# Patient Record
Sex: Female | Born: 1935 | Race: Black or African American | Hispanic: No | Smoking: Never smoker
Health system: Southern US, Community
[De-identification: ages and names within clinical notes are randomized; demographics above are authoritative.]

## PROBLEM LIST (undated history)

## (undated) DIAGNOSIS — I519 Heart disease, unspecified: Secondary | ICD-10-CM

## (undated) DIAGNOSIS — E7800 Pure hypercholesterolemia, unspecified: Secondary | ICD-10-CM

## (undated) DIAGNOSIS — M1990 Unspecified osteoarthritis, unspecified site: Secondary | ICD-10-CM

## (undated) DIAGNOSIS — I10 Essential (primary) hypertension: Secondary | ICD-10-CM

## (undated) DIAGNOSIS — K631 Perforation of intestine (nontraumatic): Secondary | ICD-10-CM

## (undated) DIAGNOSIS — E119 Type 2 diabetes mellitus without complications: Secondary | ICD-10-CM

## (undated) DIAGNOSIS — F0390 Unspecified dementia without behavioral disturbance: Secondary | ICD-10-CM

## (undated) DIAGNOSIS — Z8719 Personal history of other diseases of the digestive system: Secondary | ICD-10-CM

## (undated) DIAGNOSIS — K219 Gastro-esophageal reflux disease without esophagitis: Secondary | ICD-10-CM

## (undated) DIAGNOSIS — E039 Hypothyroidism, unspecified: Secondary | ICD-10-CM

## (undated) HISTORY — DX: Gastro-esophageal reflux disease without esophagitis: K21.9

## (undated) HISTORY — PX: BLADDER SUSPENSION: SHX72

---

## 1998-03-07 ENCOUNTER — Ambulatory Visit (HOSPITAL_COMMUNITY): Admission: RE | Admit: 1998-03-07 | Discharge: 1998-03-07 | Payer: Self-pay | Admitting: *Deleted

## 1998-03-07 ENCOUNTER — Other Ambulatory Visit: Admission: RE | Admit: 1998-03-07 | Discharge: 1998-03-07 | Payer: Self-pay | Admitting: *Deleted

## 1998-03-12 ENCOUNTER — Ambulatory Visit (HOSPITAL_COMMUNITY): Admission: RE | Admit: 1998-03-12 | Discharge: 1998-03-12 | Payer: Self-pay | Admitting: *Deleted

## 1998-04-10 ENCOUNTER — Other Ambulatory Visit: Admission: RE | Admit: 1998-04-10 | Discharge: 1998-04-10 | Payer: Self-pay | Admitting: *Deleted

## 1998-05-14 ENCOUNTER — Emergency Department (HOSPITAL_COMMUNITY): Admission: EM | Admit: 1998-05-14 | Discharge: 1998-05-14 | Payer: Self-pay | Admitting: Emergency Medicine

## 1998-05-16 ENCOUNTER — Other Ambulatory Visit: Admission: RE | Admit: 1998-05-16 | Discharge: 1998-05-16 | Payer: Self-pay | Admitting: *Deleted

## 1998-11-28 ENCOUNTER — Other Ambulatory Visit: Admission: RE | Admit: 1998-11-28 | Discharge: 1998-11-28 | Payer: Self-pay | Admitting: Obstetrics and Gynecology

## 1999-02-16 ENCOUNTER — Emergency Department (HOSPITAL_COMMUNITY): Admission: EM | Admit: 1999-02-16 | Discharge: 1999-02-16 | Payer: Self-pay | Admitting: Emergency Medicine

## 1999-03-27 ENCOUNTER — Other Ambulatory Visit: Admission: RE | Admit: 1999-03-27 | Discharge: 1999-03-27 | Payer: Self-pay | Admitting: *Deleted

## 1999-04-16 ENCOUNTER — Ambulatory Visit (HOSPITAL_COMMUNITY): Admission: RE | Admit: 1999-04-16 | Discharge: 1999-04-16 | Payer: Self-pay | Admitting: *Deleted

## 1999-10-29 ENCOUNTER — Emergency Department (HOSPITAL_COMMUNITY): Admission: EM | Admit: 1999-10-29 | Discharge: 1999-10-29 | Payer: Self-pay | Admitting: Emergency Medicine

## 1999-10-29 ENCOUNTER — Encounter: Payer: Self-pay | Admitting: Emergency Medicine

## 1999-11-27 ENCOUNTER — Other Ambulatory Visit: Admission: RE | Admit: 1999-11-27 | Discharge: 1999-11-27 | Payer: Self-pay | Admitting: Obstetrics and Gynecology

## 2000-02-11 ENCOUNTER — Inpatient Hospital Stay (HOSPITAL_COMMUNITY): Admission: RE | Admit: 2000-02-11 | Discharge: 2000-02-14 | Payer: Self-pay | Admitting: Obstetrics and Gynecology

## 2000-03-16 ENCOUNTER — Emergency Department (HOSPITAL_COMMUNITY): Admission: EM | Admit: 2000-03-16 | Discharge: 2000-03-16 | Payer: Self-pay | Admitting: Emergency Medicine

## 2000-04-07 ENCOUNTER — Other Ambulatory Visit: Admission: RE | Admit: 2000-04-07 | Discharge: 2000-04-07 | Payer: Self-pay | Admitting: Urology

## 2000-06-29 ENCOUNTER — Ambulatory Visit (HOSPITAL_COMMUNITY): Admission: RE | Admit: 2000-06-29 | Discharge: 2000-06-29 | Payer: Self-pay | Admitting: *Deleted

## 2001-07-01 ENCOUNTER — Encounter: Payer: Self-pay | Admitting: Internal Medicine

## 2001-07-01 ENCOUNTER — Ambulatory Visit (HOSPITAL_COMMUNITY): Admission: RE | Admit: 2001-07-01 | Discharge: 2001-07-01 | Payer: Self-pay | Admitting: *Deleted

## 2002-04-14 ENCOUNTER — Encounter: Payer: Self-pay | Admitting: Internal Medicine

## 2002-04-14 ENCOUNTER — Ambulatory Visit (HOSPITAL_COMMUNITY): Admission: RE | Admit: 2002-04-14 | Discharge: 2002-04-14 | Payer: Self-pay | Admitting: Internal Medicine

## 2002-07-12 ENCOUNTER — Other Ambulatory Visit: Admission: RE | Admit: 2002-07-12 | Discharge: 2002-07-12 | Payer: Self-pay | Admitting: Internal Medicine

## 2002-07-19 ENCOUNTER — Ambulatory Visit (HOSPITAL_COMMUNITY): Admission: RE | Admit: 2002-07-19 | Discharge: 2002-07-19 | Payer: Self-pay | Admitting: Obstetrics and Gynecology

## 2002-07-19 ENCOUNTER — Encounter: Payer: Self-pay | Admitting: Internal Medicine

## 2002-08-08 ENCOUNTER — Ambulatory Visit (HOSPITAL_COMMUNITY): Admission: RE | Admit: 2002-08-08 | Discharge: 2002-08-08 | Payer: Self-pay | Admitting: Internal Medicine

## 2002-08-08 ENCOUNTER — Encounter: Payer: Self-pay | Admitting: Internal Medicine

## 2002-08-18 ENCOUNTER — Ambulatory Visit (HOSPITAL_COMMUNITY): Admission: RE | Admit: 2002-08-18 | Discharge: 2002-08-18 | Payer: Self-pay | Admitting: Internal Medicine

## 2002-08-18 ENCOUNTER — Encounter: Payer: Self-pay | Admitting: Internal Medicine

## 2003-01-07 ENCOUNTER — Encounter: Payer: Self-pay | Admitting: Emergency Medicine

## 2003-01-07 ENCOUNTER — Emergency Department (HOSPITAL_COMMUNITY): Admission: EM | Admit: 2003-01-07 | Discharge: 2003-01-07 | Payer: Self-pay | Admitting: Emergency Medicine

## 2003-01-12 ENCOUNTER — Encounter: Payer: Self-pay | Admitting: *Deleted

## 2003-01-12 ENCOUNTER — Emergency Department (HOSPITAL_COMMUNITY): Admission: EM | Admit: 2003-01-12 | Discharge: 2003-01-12 | Payer: Self-pay | Admitting: Emergency Medicine

## 2003-08-09 ENCOUNTER — Ambulatory Visit (HOSPITAL_COMMUNITY): Admission: RE | Admit: 2003-08-09 | Discharge: 2003-08-09 | Payer: Self-pay | Admitting: Internal Medicine

## 2003-08-09 ENCOUNTER — Encounter: Payer: Self-pay | Admitting: Internal Medicine

## 2003-11-19 ENCOUNTER — Encounter: Admission: RE | Admit: 2003-11-19 | Discharge: 2003-11-19 | Payer: Self-pay | Admitting: Orthopedic Surgery

## 2003-11-22 ENCOUNTER — Emergency Department (HOSPITAL_COMMUNITY): Admission: EM | Admit: 2003-11-22 | Discharge: 2003-11-22 | Payer: Self-pay | Admitting: Emergency Medicine

## 2004-01-21 ENCOUNTER — Encounter: Admission: RE | Admit: 2004-01-21 | Discharge: 2004-01-21 | Payer: Self-pay | Admitting: Orthopedic Surgery

## 2004-09-08 ENCOUNTER — Ambulatory Visit (HOSPITAL_COMMUNITY): Admission: RE | Admit: 2004-09-08 | Discharge: 2004-09-08 | Payer: Self-pay | Admitting: Internal Medicine

## 2005-08-12 ENCOUNTER — Other Ambulatory Visit: Admission: RE | Admit: 2005-08-12 | Discharge: 2005-08-12 | Payer: Self-pay | Admitting: Internal Medicine

## 2005-08-13 ENCOUNTER — Emergency Department (HOSPITAL_COMMUNITY): Admission: EM | Admit: 2005-08-13 | Discharge: 2005-08-13 | Payer: Self-pay | Admitting: Emergency Medicine

## 2006-05-17 ENCOUNTER — Encounter: Admission: RE | Admit: 2006-05-17 | Discharge: 2006-05-17 | Payer: Self-pay | Admitting: Orthopaedic Surgery

## 2006-06-01 ENCOUNTER — Emergency Department (HOSPITAL_COMMUNITY): Admission: EM | Admit: 2006-06-01 | Discharge: 2006-06-01 | Payer: Self-pay | Admitting: Emergency Medicine

## 2006-06-08 ENCOUNTER — Emergency Department (HOSPITAL_COMMUNITY): Admission: EM | Admit: 2006-06-08 | Discharge: 2006-06-08 | Payer: Self-pay | Admitting: Emergency Medicine

## 2006-06-22 ENCOUNTER — Encounter: Admission: RE | Admit: 2006-06-22 | Discharge: 2006-06-22 | Payer: Self-pay | Admitting: Orthopaedic Surgery

## 2006-11-01 ENCOUNTER — Encounter: Admission: RE | Admit: 2006-11-01 | Discharge: 2006-11-01 | Payer: Self-pay | Admitting: *Deleted

## 2006-12-13 ENCOUNTER — Other Ambulatory Visit: Admission: RE | Admit: 2006-12-13 | Discharge: 2006-12-13 | Payer: Self-pay | Admitting: Internal Medicine

## 2006-12-21 ENCOUNTER — Ambulatory Visit (HOSPITAL_COMMUNITY): Admission: RE | Admit: 2006-12-21 | Discharge: 2006-12-21 | Payer: Self-pay | Admitting: Internal Medicine

## 2007-03-23 ENCOUNTER — Encounter: Admission: RE | Admit: 2007-03-23 | Discharge: 2007-03-23 | Payer: Self-pay | Admitting: Orthopedic Surgery

## 2007-04-05 ENCOUNTER — Encounter: Admission: RE | Admit: 2007-04-05 | Discharge: 2007-04-05 | Payer: Self-pay | Admitting: Orthopedic Surgery

## 2007-05-27 ENCOUNTER — Encounter: Admission: RE | Admit: 2007-05-27 | Discharge: 2007-05-27 | Payer: Self-pay | Admitting: Orthopedic Surgery

## 2007-11-01 ENCOUNTER — Emergency Department (HOSPITAL_COMMUNITY): Admission: EM | Admit: 2007-11-01 | Discharge: 2007-11-02 | Payer: Self-pay | Admitting: Emergency Medicine

## 2008-04-24 ENCOUNTER — Emergency Department (HOSPITAL_COMMUNITY): Admission: EM | Admit: 2008-04-24 | Discharge: 2008-04-24 | Payer: Self-pay | Admitting: Emergency Medicine

## 2008-11-08 ENCOUNTER — Encounter: Admission: RE | Admit: 2008-11-08 | Discharge: 2008-11-08 | Payer: Self-pay | Admitting: Chiropractic Medicine

## 2008-12-28 ENCOUNTER — Inpatient Hospital Stay (HOSPITAL_COMMUNITY): Admission: EM | Admit: 2008-12-28 | Discharge: 2008-12-30 | Payer: Self-pay | Admitting: Emergency Medicine

## 2009-04-09 ENCOUNTER — Emergency Department (HOSPITAL_COMMUNITY): Admission: EM | Admit: 2009-04-09 | Discharge: 2009-04-09 | Payer: Self-pay | Admitting: Emergency Medicine

## 2010-01-08 ENCOUNTER — Encounter: Admission: RE | Admit: 2010-01-08 | Discharge: 2010-01-08 | Payer: Self-pay | Admitting: Chiropractic Medicine

## 2010-01-13 ENCOUNTER — Inpatient Hospital Stay (HOSPITAL_COMMUNITY): Admission: EM | Admit: 2010-01-13 | Discharge: 2010-01-16 | Payer: Self-pay | Admitting: Emergency Medicine

## 2010-01-14 ENCOUNTER — Encounter (INDEPENDENT_AMBULATORY_CARE_PROVIDER_SITE_OTHER): Payer: Self-pay | Admitting: Family Medicine

## 2010-01-14 ENCOUNTER — Ambulatory Visit: Payer: Self-pay | Admitting: Surgery

## 2010-11-05 ENCOUNTER — Encounter
Admission: RE | Admit: 2010-11-05 | Discharge: 2010-11-05 | Payer: Self-pay | Source: Home / Self Care | Attending: Cardiology | Admitting: Cardiology

## 2010-11-24 ENCOUNTER — Encounter: Payer: Self-pay | Admitting: Internal Medicine

## 2010-12-04 ENCOUNTER — Ambulatory Visit (HOSPITAL_COMMUNITY): Payer: Medicare Other

## 2010-12-08 ENCOUNTER — Ambulatory Visit (HOSPITAL_COMMUNITY)
Admission: RE | Admit: 2010-12-08 | Discharge: 2010-12-08 | Disposition: A | Discharge status: Home / Self Care | Payer: Medicare Other | Source: Ambulatory Visit | Attending: Cardiology | Admitting: Cardiology

## 2010-12-08 DIAGNOSIS — M79609 Pain in unspecified limb: Secondary | ICD-10-CM | POA: Insufficient documentation

## 2010-12-08 DIAGNOSIS — R609 Edema, unspecified: Secondary | ICD-10-CM

## 2011-01-26 LAB — CARDIAC PANEL(CRET KIN+CKTOT+MB+TROPI)
CK, MB: 6.9 ng/mL (ref 0.3–4.0)
Total CK: 594 U/L — ABNORMAL HIGH (ref 7–177)
Total CK: 596 U/L — ABNORMAL HIGH (ref 7–177)

## 2011-01-26 LAB — GLUCOSE, CAPILLARY
Glucose-Capillary: 100 mg/dL — ABNORMAL HIGH (ref 70–99)
Glucose-Capillary: 100 mg/dL — ABNORMAL HIGH (ref 70–99)
Glucose-Capillary: 116 mg/dL — ABNORMAL HIGH (ref 70–99)
Glucose-Capillary: 116 mg/dL — ABNORMAL HIGH (ref 70–99)
Glucose-Capillary: 119 mg/dL — ABNORMAL HIGH (ref 70–99)
Glucose-Capillary: 120 mg/dL — ABNORMAL HIGH (ref 70–99)
Glucose-Capillary: 124 mg/dL — ABNORMAL HIGH (ref 70–99)
Glucose-Capillary: 134 mg/dL — ABNORMAL HIGH (ref 70–99)
Glucose-Capillary: 74 mg/dL (ref 70–99)
Glucose-Capillary: 82 mg/dL (ref 70–99)

## 2011-01-26 LAB — DIFFERENTIAL
Basophils Relative: 1 % (ref 0–1)
Eosinophils Absolute: 0.4 10*3/uL (ref 0.0–0.7)
Eosinophils Relative: 5 % (ref 0–5)
Lymphs Abs: 1.6 10*3/uL (ref 0.7–4.0)

## 2011-01-26 LAB — URINE MICROSCOPIC-ADD ON

## 2011-01-26 LAB — URINALYSIS, ROUTINE W REFLEX MICROSCOPIC
Glucose, UA: 100 mg/dL — AB
Glucose, UA: NEGATIVE mg/dL
Leukocytes, UA: NEGATIVE
Protein, ur: NEGATIVE mg/dL
pH: 6 (ref 5.0–8.0)

## 2011-01-26 LAB — LIPID PANEL
HDL: 68 mg/dL (ref >39)
Triglycerides: 60 mg/dL (ref <150)
VLDL: 12 mg/dL (ref 0–40)

## 2011-01-26 LAB — CBC
HCT: 38.2 % (ref 36.0–46.0)
MCHC: 32.1 g/dL (ref 30.0–36.0)
MCV: 91.1 fL (ref 78.0–100.0)
Platelets: 125 10*3/uL — ABNORMAL LOW (ref 150–400)
Platelets: 148 10*3/uL — ABNORMAL LOW (ref 150–400)
RDW: 13.6 % (ref 11.5–15.5)
WBC: 8.3 10*3/uL (ref 4.0–10.5)

## 2011-01-26 LAB — BASIC METABOLIC PANEL
BUN: 20 mg/dL (ref 6–23)
CO2: 30 mEq/L (ref 19–32)
Chloride: 98 mEq/L (ref 96–112)
Glucose, Bld: 131 mg/dL — ABNORMAL HIGH (ref 70–99)
Potassium: 4.1 mEq/L (ref 3.5–5.1)

## 2011-01-26 LAB — POCT CARDIAC MARKERS: Myoglobin, poc: 496 ng/mL (ref 12–200)

## 2011-01-26 LAB — COMPREHENSIVE METABOLIC PANEL
AST: 38 U/L — ABNORMAL HIGH (ref 0–37)
Albumin: 3.5 g/dL (ref 3.5–5.2)
Alkaline Phosphatase: 68 U/L (ref 39–117)
Chloride: 99 mEq/L (ref 96–112)
GFR calc Af Amer: >60 mL/min (ref >60)
Potassium: 3.8 mEq/L (ref 3.5–5.1)
Total Bilirubin: 1.8 mg/dL — ABNORMAL HIGH (ref 0.3–1.2)

## 2011-01-26 LAB — URINE CULTURE
Colony Count: NO GROWTH
Culture: NO GROWTH

## 2011-01-26 LAB — T3, FREE: T3, Free: 2.3 pg/mL (ref 2.3–4.2)

## 2011-01-26 LAB — T4, FREE: Free T4: 1.1 ng/dL (ref 0.80–1.80)

## 2011-02-09 LAB — URINE MICROSCOPIC-ADD ON

## 2011-02-09 LAB — URINE CULTURE: Colony Count: 30000

## 2011-02-09 LAB — CBC
HCT: 42.8 % (ref 36.0–46.0)
MCHC: 32.5 g/dL (ref 30.0–36.0)
MCV: 91.6 fL (ref 78.0–100.0)
Platelets: 114 10*3/uL — ABNORMAL LOW (ref 150–400)
RDW: 13.1 % (ref 11.5–15.5)
WBC: 6.9 10*3/uL (ref 4.0–10.5)

## 2011-02-09 LAB — COMPREHENSIVE METABOLIC PANEL
Albumin: 3.6 g/dL (ref 3.5–5.2)
BUN: 8 mg/dL (ref 6–23)
Calcium: 9.9 mg/dL (ref 8.4–10.5)
Chloride: 102 mEq/L (ref 96–112)
Creatinine, Ser: 0.77 mg/dL (ref 0.4–1.2)
GFR calc non Af Amer: >60 mL/min (ref >60)
Total Bilirubin: 1.2 mg/dL (ref 0.3–1.2)

## 2011-02-09 LAB — URINALYSIS, ROUTINE W REFLEX MICROSCOPIC
Bilirubin Urine: NEGATIVE
Glucose, UA: NEGATIVE mg/dL
Specific Gravity, Urine: 1.004 — ABNORMAL LOW (ref 1.005–1.030)
pH: 7.5 (ref 5.0–8.0)

## 2011-02-09 LAB — DIFFERENTIAL
Basophils Absolute: 0.1 10*3/uL (ref 0.0–0.1)
Basophils Relative: 2 % — ABNORMAL HIGH (ref 0–1)
Eosinophils Absolute: 0.6 10*3/uL (ref 0.0–0.7)
Lymphocytes Relative: 32 % (ref 12–46)
Neutrophils Relative %: 50 % (ref 43–77)

## 2011-02-17 LAB — URINE CULTURE: Colony Count: 50000

## 2011-02-17 LAB — URINALYSIS, ROUTINE W REFLEX MICROSCOPIC
Bilirubin Urine: NEGATIVE
Ketones, ur: NEGATIVE mg/dL

## 2011-02-17 LAB — COMPREHENSIVE METABOLIC PANEL
ALT: 21 U/L (ref 0–35)
Alkaline Phosphatase: 67 U/L (ref 39–117)
CO2: 28 mEq/L (ref 19–32)
GFR calc non Af Amer: >60 mL/min (ref >60)
Glucose, Bld: 207 mg/dL — ABNORMAL HIGH (ref 70–99)
Potassium: 3.3 mEq/L — ABNORMAL LOW (ref 3.5–5.1)
Sodium: 137 mEq/L (ref 135–145)
Total Bilirubin: 0.9 mg/dL (ref 0.3–1.2)

## 2011-02-17 LAB — LIPID PANEL
Cholesterol: 177 mg/dL (ref 0–200)
Total CHOL/HDL Ratio: 2.7 RATIO
VLDL: 16 mg/dL (ref 0–40)

## 2011-02-17 LAB — CBC
HCT: 35.7 % — ABNORMAL LOW (ref 36.0–46.0)
Hemoglobin: 12.2 g/dL (ref 12.0–15.0)
Hemoglobin: 12.7 g/dL (ref 12.0–15.0)
Hemoglobin: 13.8 g/dL (ref 12.0–15.0)
MCHC: 34.1 g/dL (ref 30.0–36.0)
Platelets: 126 10*3/uL — ABNORMAL LOW (ref 150–400)
RBC: 4.13 MIL/uL (ref 3.87–5.11)
RBC: 4.16 MIL/uL (ref 3.87–5.11)
RBC: 4.58 MIL/uL (ref 3.87–5.11)
RDW: 14.5 % (ref 11.5–15.5)
RDW: 14.7 % (ref 11.5–15.5)
WBC: 8.1 10*3/uL (ref 4.0–10.5)

## 2011-02-17 LAB — GLUCOSE, CAPILLARY
Glucose-Capillary: 114 mg/dL — ABNORMAL HIGH (ref 70–99)
Glucose-Capillary: 131 mg/dL — ABNORMAL HIGH (ref 70–99)
Glucose-Capillary: 138 mg/dL — ABNORMAL HIGH (ref 70–99)
Glucose-Capillary: 145 mg/dL — ABNORMAL HIGH (ref 70–99)
Glucose-Capillary: 186 mg/dL — ABNORMAL HIGH (ref 70–99)
Glucose-Capillary: 191 mg/dL — ABNORMAL HIGH (ref 70–99)

## 2011-02-17 LAB — TROPONIN I: Troponin I: <0.01 ng/mL (ref 0.00–0.06)

## 2011-02-17 LAB — BASIC METABOLIC PANEL
BUN: 9 mg/dL (ref 6–23)
CO2: 27 mEq/L (ref 19–32)
Calcium: 8.9 mg/dL (ref 8.4–10.5)
Calcium: 9.1 mg/dL (ref 8.4–10.5)
Calcium: 9.1 mg/dL (ref 8.4–10.5)
Creatinine, Ser: 0.78 mg/dL (ref 0.4–1.2)
GFR calc Af Amer: >60 mL/min (ref >60)
GFR calc Af Amer: >60 mL/min (ref >60)
GFR calc non Af Amer: >60 mL/min (ref >60)
Glucose, Bld: 117 mg/dL — ABNORMAL HIGH (ref 70–99)
Glucose, Bld: 127 mg/dL — ABNORMAL HIGH (ref 70–99)
Sodium: 136 mEq/L (ref 135–145)
Sodium: 139 mEq/L (ref 135–145)
Sodium: 141 mEq/L (ref 135–145)

## 2011-02-17 LAB — CK TOTAL AND CKMB (NOT AT ARMC)
Relative Index: INVALID (ref 0.0–2.5)
Relative Index: INVALID (ref 0.0–2.5)
Total CK: 37 U/L (ref 7–177)
Total CK: 50 U/L (ref 7–177)
Total CK: 55 U/L (ref 7–177)

## 2011-02-17 LAB — URINE MICROSCOPIC-ADD ON

## 2011-02-17 LAB — DIFFERENTIAL
Basophils Relative: 0 % (ref 0–1)
Eosinophils Absolute: 0 10*3/uL (ref 0.0–0.7)
Neutrophils Relative %: 85 % — ABNORMAL HIGH (ref 43–77)

## 2011-02-17 LAB — HEMOGLOBIN A1C
Hgb A1c MFr Bld: 6.1 % (ref 4.6–6.1)
Mean Plasma Glucose: 128 mg/dL

## 2011-03-17 NOTE — Consult Note (Signed)
NAMESAMMIE, Megan [name] NO.:  [id number]   MEDICAL RECORD NO.:  [id number]          PATIENT TYPE:  INP   LOCATION:  6522                         FACILITY:  [facility]   PHYSICIAN:  Graylin Shiver, M.D.   DATE OF BIRTH:  1926-12-05   DATE OF CONSULTATION:  DATE OF DISCHARGE:                                 CONSULTATION   We were asked to see Megan [name] today in consultation for GI bleeding by  Dr. Orvan Falconer of [facility].   HISTORY OF PRESENT ILLNESS:  Megan [name] is a very pleasant African  American female, who reports a 40-pound weight loss in the past year as  well as anorexia and one large episode of bright red blood per rectum  yesterday.  She denies abdominal pain, nausea, vomiting, melena, or  other GI symptoms.  She worked for [employer]	[place] and retired from  there, but never smoked personally.  She does have chronic headaches and  sinusitis.  Bowel movements prior to yesterday were normal.  She has no  known history of colon cancer in the family.  She tells me that she had  a colonoscopy years ago, cannot remember who did it, but believes that  was normal.  She also tells me she has 9 children, 25 grandchildren, and  36 great grandchildren.   PAST MEDICAL HISTORY:  Significant for her primary care physician is Dr.  Lovenia Kim.  She has the following comorbidities:  1. Hypertension.  2. Diabetes.  3. Arthritis.  4. Sinusitis.  5. Dental problems.  6. History of vaginal prolapse.  7. She has had bladder tack and cataract surgery.   CURRENT MEDICATIONS:  Amaryl, Lotensin, Coreg, a daily aspirin,  azithromycin that she is currently on, being treated for sinusitis.  She  has no known drug allergies.   REVIEW OF SYSTEMS:  Significant for her sinusitis and headaches as well  as her 40-pound weight loss and anorexia.   SOCIAL HISTORY:  Negative for tobacco, alcohol, and drug use.  She is  retired from [employer].   FAMILY HISTORY:   Negative for colon cancer.   PHYSICAL EXAMINATION:  GENERAL:  She is alert and oriented, very  pleasant to speak with her.  HEART:  Regular rhythm, slightly brady rate.  LUNGS:  Clear to auscultation anteriorly.  ABDOMEN:  Soft, nontender, and nondistended with good bowel sounds.   LABORATORY DATA:  Labs show a hemoglobin of 12.7 with an MCV value of  90, hematocrit 37.3, white count 10.4, and platelets 149,000.  BMET is  significant for potassium of 3.1, which is being repleted.  BUN 14,  creatinine 0.75, and glucose 127.  LFTs are normal.  Chest x-ray is  negative.  CT of her abdomen and pelvis done today shows:  1. A 4-cm mass in the right ascending colon.  2. A 6-cm cyst on her left kidney.   ASSESSMENT:  Dr. Herbert Moors has seen and examined the patient,  collected history and reviewed her chart.  His impression is this is a  very pleasant 77 year old female with a 40-pound  weight loss, painless  rectal bleeding, and a 4 cm ascending colon mass.  We will plan for  colonoscopy tomorrow December 29, 2008, with biopsy.   Thanks very much for this consultation.      Stephani Police, PA    ______________________________  Graylin Shiver, M.D.    MLY/MEDQ  D:  12/28/2008  T:  12/29/2008  Job:  [id number]   cc:   Lovenia Kim, D.O.  Graylin Shiver, M.D.

## 2011-03-17 NOTE — Op Note (Signed)
NAME:  Megan [name], Megan [name] NO.:  [id number]   MEDICAL RECORD NO.:  [id number]          PATIENT TYPE:  INP   LOCATION:  6522                         FACILITY:  [facility]   PHYSICIAN:  Graylin Shiver, M.D.   DATE OF BIRTH:  1927-02-11   DATE OF PROCEDURE:  12/29/2008  DATE OF DISCHARGE:                               OPERATIVE REPORT   INDICATION:  Weight loss, rectal bleeding, abnormal CT scan raising the  question of a 4-cm mass just above the cecum in the colon.   Informed consent was obtained after explanation of the risks of  bleeding, infection, or perforation.   PREMEDICATIONS:  Fentanyl 35 mcg IV, Versed 3.5 mg IV.   PROCEDURE:  With the patient in the left lateral decubitus position, a  rectal exam was performed and no masses were felt.  The Pentax  colonoscope was inserted into the rectum and advanced around the colon  to the cecum.  Cecal landmarks were identified by identification of the  ileocecal valve and appendiceal orifice.  The cecum and ascending colon  looked normal.  I passed up and down this area several times and saw no  evidence of a mass lesion.  The prep was good and occasional  diverticulum was noted.  The transverse colon looked normal.  The  descending colon and sigmoid showed occasional diverticula.  The rectum  looked normal on forward view and retroflexion.  She tolerated the  procedure well without complications.   IMPRESSION:  Diverticulosis, otherwise normal colonoscopy to the cecum.           ______________________________  Graylin Shiver, M.D.     SFG/MEDQ  D:  12/29/2008  T:  12/29/2008  Job:  [id number]   cc:   [facility]

## 2011-03-17 NOTE — Discharge Summary (Signed)
Megan [name], Megan [name]               ACCOUNT NO.:  [id number]   MEDICAL RECORD NO.:  [id number]          PATIENT TYPE:  INP   LOCATION:  6522                         FACILITY:  [facility]   PHYSICIAN:  Altha Harm, MDDATE OF BIRTH:  08-Feb-1927   DATE OF ADMISSION:  12/27/2008  DATE OF DISCHARGE:  12/30/2008                               DISCHARGE SUMMARY   DISCHARGE DISPOSITION:  Home.   FINAL DISCHARGE DIAGNOSES:  1. Bright red blood per rectum.  2. Hypertension.  3. Diabetes type 2.  4. Osteoarthritis.  5. Chronic sinusitis.  6. Chronic weight loss.   DISCHARGE MEDICATIONS:  1. Amaryl 4 mg p.o. daily.  2. Lotensin 40 mg p.o. b.i.d.  3. Coreg 25 mg p.o. b.i.d.  4. Aspirin 81 mg p.o. daily.   PROCEDURES:  Colonoscopy which showed diverticulosis and otherwise  normal colon to the cecum.   DIAGNOSTIC STUDIES:  1. Chest x-ray, two-view, done on admission which shows no acute      cardiopulmonary process and  hyperinflated lungs.  2. CT scan of the abdomen and pelvis with contrast which shows:      a.     A 4 cm mass in the right colon just above the cecum.      b.     A 6 cm cyst of the left kidney with course of calcification       along the wall.      c.     Negative CT scan of the pelvis.  3. Repeat CT of the abdomen and pelvis with contrast which shows:      a.     Stool admixed with gas in the cecum and  the ascending       colon.      b.     The previously identified masslike area of soft tissue       densities no longer identified and given the report of negative       colonoscopy, most likely represented a mass-like area of stool.      c.     Mildly complex 6 cm cyst in the left kidney containing       calcifications.  A follow-up renal  protocol CT versus abdominal       MRI in 6 months was recommended.      d.     Simple renal cyst.  E,  Diverticulosis.   CONSULTANTS:  Graylin Shiver, M.D., gastroenterology.   PRIMARY CARE PHYSICIAN:  Lovenia Kim,  D.O.   CHIEF COMPLAINT:  Bloody stools times 1 day.   HISTORY OF PRESENT ILLNESS:  Please refer to the H&P by Dr. Vania Rea for details of the HPI.   HOSPITAL COURSE:  The patient was admitted and observed for further  bleeding per rectum.  This resolved at the time of admission.  The  patient had no further bleeding.  Her hemoglobin remained stable and did  not require any transfusions.  The patient was endoscopically evaluated  by colonoscopy with findings as mentioned above.  The patient tolerated  advancement of diet  up to her diabetic regular texture diet without any  difficulty.  The patient is being discharged on the above-stated  medications.  In light of the fact that the patient had no lesions, she  is able to resume her aspirin 81 mg p.o. daily.   The patient reports a 40-pound weight loss within the last 6 months  without intention.  This was concerning for  some carcinomatous process  versus an autoimmune rheumatological process within the body.  I will  defer to the primary care physician, Dr. Marisue Brooklyn who apparently  has been doing a workup of this patient for this specific complaint.   DIET:  Diabetic low-sodium diet.   ACTIVITY:  Physical restrictions are none.   FOLLOW UP:  The patient will follow up with Dr. Elisabeth Most on a p.r.n.  basis      Altha Harm, MD  Electronically Signed     MAM/MEDQ  D:  12/30/2008  T:  12/30/2008  Job:  [id number]   cc:   Lovenia Kim, D.O.

## 2011-03-17 NOTE — H&P (Signed)
Megan [name], Megan [name] NO.:  [id number]   MEDICAL RECORD NO.:  [id number]          PATIENT TYPE:  INP   LOCATION:  1858                         FACILITY:  [facility]   PHYSICIAN:  Vania Rea, M.D. DATE OF BIRTH:  Apr 08, 1927   DATE OF ADMISSION:  12/28/2008  DATE OF DISCHARGE:                              HISTORY & PHYSICAL   PRIMARY CARE PHYSICIAN:  Lovenia Kim, D.O.   CHIEF COMPLAINT:  Bloody stools since yesterday.   HISTORY OF PRESENT ILLNESS:  This is a 77 year old lady who is called  Megan [name], and as a result of this, throughout the emergency room note, she  is described as a 77 year old female . However, this is a 77 year old lady  with a history of hypertension, diabetes, currently being treated for  sinusitis who said she has had a colonoscopy less than 10 years ago but  cannot tell me who did the colonoscopy, and there is no record of it in  our system.  Came to the emergency room today because of new onset of  bright red blood mixed with her stool.  The patient was evaluated by the  emergency room physician, had a CT scan of the abdomen and pelvis which  revealed a mass in the colon just above the cecum and the hospitalist  service was called to assist with management.  The patient is having no  dizziness or syncope.  No fever, cough or cold.  No chest pains, no  shortness of breath.   PAST MEDICAL HISTORY:  1. Hypertension.  2. Diabetes.  3. Arthritis.  4. Currently being treated sinusitis.  5. Dental problems.   PAST SURGICAL HISTORY:  1. Status post bladder tack.  2. Status post cataract surgery.   MEDICATIONS:  The patient is unsure of her medications, but from the  emergency room notes, it looks like:  1. Amaryl 4 mg daily.  2. Lotensin 40 mg b.i.d.  3. Coreg 25 mg b.i.d.  4. Aspirin 81 mg daily.  5. Azithromycin daily.  6. She apparently used to take methadone for pain, but she gives the      impression she no longer takes these  medications.   ALLERGIES:  No known drug allergies.   SOCIAL HISTORY:  She denies tobacco, alcohol or illicit drug use.  Lives  with relatives.   FAMILY HISTORY:  No family history of cancer.   REVIEW OF SYSTEMS:  On a 10-point review of systems, the patient said  denies any problems other then the blood in the stool and the sinusitis.   PHYSICAL EXAMINATION:  GENERAL:  Very pleasant, elderly African American  lady, lying on the stretcher in no acute distress.  VITAL SIGNS:  Temperature 116/84, pulse 53, respirations 18, temperature  98.2, saturating 99% on room air.  HEENT:  Pupils are round and equal.  She is pale.  She is nonicteric.  No cervical lymphadenopathy or thyromegaly.  No jugular venous  distention.  CHEST:  Clear to auscultation bilaterally.  CARDIOVASCULAR:  Regular rhythm without murmur.  ABDOMEN:  Mildly obese, soft and nontender.  EXTREMITIES:  Without  edema.  She has 2+ pulses bilaterally.  CNS:  Cranial nerves II-XII grossly intact.  No focal neurological  deficits.   LABORATORY DATA:  CBC is unremarkable, in fact, completely normal.  Platelets 114.  Serum chemistry significant for potassium of 3.3,  glucose 207, BUN 14, creatinine 0.73, albumin 3.7.  CT scan of the  abdomen and pelvis with contrast shows a 4 cm mass in the right colon  just below the cecum, worrisome for carcinoma.  She has a 6 cm cyst in  the left kidney with course calcifications.  Probably benign.  Could be  a [place] 23f lesion, repeat scan suggested in 6 months.  Negative CT scan  of the pelvis.   ASSESSMENT:  1. Right colonic mass query cancer associated with lower GI bleed.  2. Hypertension, controlled.  3. Diabetes type 2, uncontrolled.  4. Hypokalemia.   PLAN:  Will admit this lady for workup of the mass, possible colonoscopy  and biopsy because she is bleeding.  Would probably be best to work her  up in hospital.  Will repeat her potassium, keep her n.p.o. and consult  GI for  assistance with further management.  Other plans as per orders.      Vania Rea, M.D.  Electronically Signed     LC/MEDQ  D:  12/28/2008  T:  12/28/2008  Job:  [id number]   cc:   Lovenia Kim, D.O.

## 2011-03-20 NOTE — Discharge Summary (Signed)
[facility]  Patient:    Megan [name], Megan [name]                    MRN: [id number] Adm. Date:  [id number] Disc. Date: [id number] Attending:  Benny Lennert                           Discharge Summary  PROBLEM:  Vaginal prolapse.  HISTORY OF PRESENT ILLNESS:  The patient is a 77 year old gravida 75, para 11-0-2-6, woman who has had a known cystocele for several years.  The patient now complains of severe vaginal pressure and discomfort, and the patient requests surgery, but does not want her uterus removed.  Therefore, the patient is brought into the operating room for just anterior repair.  PHYSICAL EXAMINATION:  HEENT:  Within normal limits, except for a tooth extraction.  NECK:  Without lymphadenopathy.  Thyroid without nodule.  CHEST:  Clear to auscultation.  HEART:  Regular rhythm without murmur.  BREASTS:  Without masses.  CVA:  Nontender.  ABDOMEN:  Obese, nontender, without mass.  GU:  External genitalia within normal limits.  Vaginal third- to fourth-degree cystocele.  Cervix normal except for second-degree prolapse.  Uterus retroverted and normal in size, nontender, mobile.  Adnexa without mass.  RECTOVAGINAL:  Without nodule.  HOSPITAL COURSE:  The patient was admitted, brought to the operating room where an anterior and posterior repair was performed for vaginal prolapse. The patient did well postoperatively.  She remained afebrile.  Her vital signs remained stable; however, the patient could not urinate well postoperatively and had a suprapubic catheter in place, and she was taught suprapubic catheter care.  She was not able to urinate enough and had two large residuals; therefore, on her second postoperative day she was discharged to home after full instruction with her suprapubic catheter.  She was to be followed up in the office at the beginning of the week unless she had less than 100 cc residuals over the weekend, in which case  she was to call the doctor on call to have the suprapubic catheter removed.  She was to call with a temperature greater than 101, severe pain, or heavy bleeding.  DISCHARGE MEDICATIONS:  Tylox #10 and Darvocet-N 100 #20. DD:  04/07/00 TD:  04/14/00 Job: [id number]

## 2011-08-07 LAB — CBC
Platelets: 132 — ABNORMAL LOW
RDW: 14.5

## 2011-08-07 LAB — COMPREHENSIVE METABOLIC PANEL
ALT: 15
Albumin: 4
Alkaline Phosphatase: 81
BUN: 10
Potassium: 4
Sodium: 138
Total Protein: 6.8

## 2011-08-07 LAB — DIFFERENTIAL
Basophils Relative: 0
Monocytes Absolute: 0.2
Monocytes Relative: 3
Neutro Abs: 6.1

## 2011-08-07 LAB — POCT CARDIAC MARKERS
Myoglobin, poc: 82.7
Troponin i, poc: <0.05

## 2012-02-26 ENCOUNTER — Other Ambulatory Visit (HOSPITAL_COMMUNITY): Payer: Self-pay | Admitting: Internal Medicine

## 2012-02-26 ENCOUNTER — Ambulatory Visit (HOSPITAL_COMMUNITY)
Admission: RE | Admit: 2012-02-26 | Discharge: 2012-02-26 | Disposition: A | Discharge status: Home / Self Care | Payer: Medicare Other | Source: Ambulatory Visit | Attending: Internal Medicine | Admitting: Internal Medicine

## 2012-02-26 DIAGNOSIS — M25519 Pain in unspecified shoulder: Secondary | ICD-10-CM | POA: Insufficient documentation

## 2012-03-23 DIAGNOSIS — K631 Perforation of intestine (nontraumatic): Secondary | ICD-10-CM

## 2012-03-23 HISTORY — PX: OTHER SURGICAL HISTORY: SHX169

## 2012-03-23 HISTORY — DX: Perforation of intestine (nontraumatic): K63.1

## 2012-03-24 ENCOUNTER — Emergency Department (HOSPITAL_COMMUNITY): Payer: Medicare Other

## 2012-03-24 ENCOUNTER — Encounter (HOSPITAL_COMMUNITY)
Admission: EM | Disposition: A | Discharge status: Home Health | Payer: Self-pay | Source: Home / Self Care | Attending: Internal Medicine

## 2012-03-24 ENCOUNTER — Inpatient Hospital Stay (HOSPITAL_COMMUNITY)
Admission: EM | Admit: 2012-03-24 | Discharge: 2012-04-01 | DRG: 853 | Disposition: A | Discharge status: Home Health | Payer: Medicare Other | Attending: Internal Medicine | Admitting: Internal Medicine

## 2012-03-24 ENCOUNTER — Encounter (HOSPITAL_COMMUNITY): Payer: Self-pay | Admitting: Emergency Medicine

## 2012-03-24 DIAGNOSIS — R6520 Severe sepsis without septic shock: Secondary | ICD-10-CM

## 2012-03-24 DIAGNOSIS — K929 Disease of digestive system, unspecified: Secondary | ICD-10-CM | POA: Diagnosis not present

## 2012-03-24 DIAGNOSIS — F17200 Nicotine dependence, unspecified, uncomplicated: Secondary | ICD-10-CM | POA: Diagnosis present

## 2012-03-24 DIAGNOSIS — E872 Acidosis, unspecified: Secondary | ICD-10-CM

## 2012-03-24 DIAGNOSIS — M129 Arthropathy, unspecified: Secondary | ICD-10-CM | POA: Diagnosis present

## 2012-03-24 DIAGNOSIS — K658 Other peritonitis: Secondary | ICD-10-CM | POA: Diagnosis present

## 2012-03-24 DIAGNOSIS — Z79899 Other long term (current) drug therapy: Secondary | ICD-10-CM

## 2012-03-24 DIAGNOSIS — F05 Delirium due to known physiological condition: Secondary | ICD-10-CM | POA: Diagnosis not present

## 2012-03-24 DIAGNOSIS — Y838 Other surgical procedures as the cause of abnormal reaction of the patient, or of later complication, without mention of misadventure at the time of the procedure: Secondary | ICD-10-CM | POA: Diagnosis not present

## 2012-03-24 DIAGNOSIS — I5032 Chronic diastolic (congestive) heart failure: Secondary | ICD-10-CM | POA: Diagnosis present

## 2012-03-24 DIAGNOSIS — K651 Peritoneal abscess: Secondary | ICD-10-CM | POA: Diagnosis present

## 2012-03-24 DIAGNOSIS — R410 Disorientation, unspecified: Secondary | ICD-10-CM

## 2012-03-24 DIAGNOSIS — K255 Chronic or unspecified gastric ulcer with perforation: Secondary | ICD-10-CM | POA: Diagnosis present

## 2012-03-24 DIAGNOSIS — J95821 Acute postprocedural respiratory failure: Secondary | ICD-10-CM | POA: Diagnosis not present

## 2012-03-24 DIAGNOSIS — E1169 Type 2 diabetes mellitus with other specified complication: Secondary | ICD-10-CM | POA: Diagnosis not present

## 2012-03-24 DIAGNOSIS — E876 Hypokalemia: Secondary | ICD-10-CM | POA: Diagnosis not present

## 2012-03-24 DIAGNOSIS — E1129 Type 2 diabetes mellitus with other diabetic kidney complication: Secondary | ICD-10-CM | POA: Diagnosis present

## 2012-03-24 DIAGNOSIS — N179 Acute kidney failure, unspecified: Secondary | ICD-10-CM

## 2012-03-24 DIAGNOSIS — K659 Peritonitis, unspecified: Secondary | ICD-10-CM

## 2012-03-24 DIAGNOSIS — E860 Dehydration: Secondary | ICD-10-CM

## 2012-03-24 DIAGNOSIS — J9600 Acute respiratory failure, unspecified whether with hypoxia or hypercapnia: Secondary | ICD-10-CM

## 2012-03-24 DIAGNOSIS — K560 Paralytic ileus: Secondary | ICD-10-CM | POA: Diagnosis not present

## 2012-03-24 DIAGNOSIS — K5730 Diverticulosis of large intestine without perforation or abscess without bleeding: Secondary | ICD-10-CM | POA: Diagnosis present

## 2012-03-24 DIAGNOSIS — I509 Heart failure, unspecified: Secondary | ICD-10-CM | POA: Diagnosis present

## 2012-03-24 DIAGNOSIS — N289 Disorder of kidney and ureter, unspecified: Secondary | ICD-10-CM

## 2012-03-24 DIAGNOSIS — Y9210 Unspecified residential institution as the place of occurrence of the external cause: Secondary | ICD-10-CM | POA: Diagnosis not present

## 2012-03-24 DIAGNOSIS — G9341 Metabolic encephalopathy: Secondary | ICD-10-CM | POA: Diagnosis not present

## 2012-03-24 DIAGNOSIS — A419 Sepsis, unspecified organism: Principal | ICD-10-CM

## 2012-03-24 DIAGNOSIS — I10 Essential (primary) hypertension: Secondary | ICD-10-CM | POA: Diagnosis present

## 2012-03-24 DIAGNOSIS — E039 Hypothyroidism, unspecified: Secondary | ICD-10-CM

## 2012-03-24 DIAGNOSIS — D638 Anemia in other chronic diseases classified elsewhere: Secondary | ICD-10-CM | POA: Diagnosis present

## 2012-03-24 DIAGNOSIS — I519 Heart disease, unspecified: Secondary | ICD-10-CM

## 2012-03-24 DIAGNOSIS — K668 Other specified disorders of peritoneum: Secondary | ICD-10-CM

## 2012-03-24 HISTORY — DX: Essential (primary) hypertension: I10

## 2012-03-24 HISTORY — DX: Unspecified osteoarthritis, unspecified site: M19.90

## 2012-03-24 HISTORY — DX: Personal history of other diseases of the digestive system: Z87.19

## 2012-03-24 HISTORY — DX: Heart disease, unspecified: I51.9

## 2012-03-24 HISTORY — PX: LAPAROSCOPY: SHX197

## 2012-03-24 HISTORY — DX: Hypothyroidism, unspecified: E03.9

## 2012-03-24 HISTORY — DX: Perforation of intestine (nontraumatic): K63.1

## 2012-03-24 HISTORY — DX: Type 2 diabetes mellitus without complications: E11.9

## 2012-03-24 HISTORY — DX: Pure hypercholesterolemia, unspecified: E78.00

## 2012-03-24 LAB — COMPREHENSIVE METABOLIC PANEL WITH GFR
Alkaline Phosphatase: 39 U/L (ref 39–117)
BUN: 37 mg/dL — ABNORMAL HIGH (ref 6–23)
Calcium: 9.3 mg/dL (ref 8.4–10.5)
Creatinine, Ser: 2.53 mg/dL — ABNORMAL HIGH (ref 0.50–1.10)
GFR calc Af Amer: 19 mL/min — ABNORMAL LOW (ref >90)
Glucose, Bld: 87 mg/dL (ref 70–99)
Total Protein: 5.5 g/dL — ABNORMAL LOW (ref 6.0–8.3)

## 2012-03-24 LAB — DIFFERENTIAL
Basophils Absolute: 0 K/uL (ref 0.0–0.1)
Basophils Relative: 0 % (ref 0–1)
Eosinophils Absolute: 0.1 K/uL (ref 0.0–0.7)
Eosinophils Relative: 2 % (ref 0–5)
Lymphocytes Relative: 18 % (ref 12–46)
Lymphs Abs: 0.9 K/uL (ref 0.7–4.0)
Monocytes Absolute: 0.3 K/uL (ref 0.1–1.0)
Monocytes Relative: 5 % (ref 3–12)
Neutro Abs: 3.7 K/uL (ref 1.7–7.7)
Neutrophils Relative %: 75 % (ref 43–77)

## 2012-03-24 LAB — URINALYSIS, ROUTINE W REFLEX MICROSCOPIC
Glucose, UA: 100 mg/dL — AB
Hgb urine dipstick: NEGATIVE
Ketones, ur: 15 mg/dL — AB
Nitrite: NEGATIVE
Protein, ur: 30 mg/dL — AB
Specific Gravity, Urine: 1.024 (ref 1.005–1.030)
Urobilinogen, UA: 0.2 mg/dL (ref 0.0–1.0)
pH: 5 (ref 5.0–8.0)

## 2012-03-24 LAB — URINE MICROSCOPIC-ADD ON

## 2012-03-24 LAB — LACTIC ACID, PLASMA: Lactic Acid, Venous: 5.4 mmol/L — ABNORMAL HIGH (ref 0.5–2.2)

## 2012-03-24 LAB — TROPONIN I: Troponin I: <0.3 ng/mL (ref <0.30)

## 2012-03-24 LAB — COMPREHENSIVE METABOLIC PANEL
ALT: 7 U/L (ref 0–35)
AST: 24 U/L (ref 0–37)
Albumin: 2.3 g/dL — ABNORMAL LOW (ref 3.5–5.2)
CO2: 15 mEq/L — ABNORMAL LOW (ref 19–32)
Chloride: 107 mEq/L (ref 96–112)
GFR calc non Af Amer: 17 mL/min — ABNORMAL LOW (ref >90)
Potassium: 4.5 mEq/L (ref 3.5–5.1)
Sodium: 139 mEq/L (ref 135–145)
Total Bilirubin: 1 mg/dL (ref 0.3–1.2)

## 2012-03-24 LAB — CBC
HCT: 39.1 % (ref 36.0–46.0)
Hemoglobin: 12.7 g/dL (ref 12.0–15.0)
MCH: 28.7 pg (ref 26.0–34.0)
MCHC: 32.5 g/dL (ref 30.0–36.0)
MCV: 88.5 fL (ref 78.0–100.0)
Platelets: 158 10*3/uL (ref 150–400)
RBC: 4.42 MIL/uL (ref 3.87–5.11)
RDW: 14.6 % (ref 11.5–15.5)
WBC: 5 10*3/uL (ref 4.0–10.5)

## 2012-03-24 LAB — PROTIME-INR
INR: 1.45 (ref 0.00–1.49)
Prothrombin Time: 17.9 seconds — ABNORMAL HIGH (ref 11.6–15.2)

## 2012-03-24 LAB — PROCALCITONIN: Procalcitonin: >175 ng/mL

## 2012-03-24 LAB — APTT: aPTT: 30 s (ref 24–37)

## 2012-03-24 SURGERY — LAPAROSCOPY, DIAGNOSTIC
Anesthesia: General | Site: Abdomen | Wound class: Dirty or Infected

## 2012-03-24 MED ORDER — PIPERACILLIN-TAZOBACTAM 3.375 G IVPB 30 MIN
3.3750 g | Freq: Once | INTRAVENOUS | Status: AC
  Administered 2012-03-24: 3.375 g via INTRAVENOUS
  Filled 2012-03-24: qty 50

## 2012-03-24 MED ORDER — SODIUM CHLORIDE 0.9 % IV BOLUS (SEPSIS)
1000.0000 mL | Freq: Once | INTRAVENOUS | Status: AC
  Administered 2012-03-24: 1000 mL via INTRAVENOUS

## 2012-03-24 SURGICAL SUPPLY — 83 items
APPLICATOR COTTON TIP 6IN STRL (MISCELLANEOUS) IMPLANT
APPLIER CLIP 5 13 M/L LIGAMAX5 (MISCELLANEOUS)
APPLIER CLIP ROT 10 11.4 M/L (STAPLE)
APR CLP MED LRG 11.4X10 (STAPLE)
APR CLP MED LRG 5 ANG JAW (MISCELLANEOUS)
BLADE SURG 10 STRL SS (BLADE) ×3 IMPLANT
CANISTER SUCTION 2500CC (MISCELLANEOUS) ×13 IMPLANT
CELLS DAT CNTRL 66122 CELL SVR (MISCELLANEOUS) IMPLANT
CHLORAPREP W/TINT 26ML (MISCELLANEOUS) ×3 IMPLANT
CLIP APPLIE 5 13 M/L LIGAMAX5 (MISCELLANEOUS) IMPLANT
CLIP APPLIE ROT 10 11.4 M/L (STAPLE) IMPLANT
CLOTH BEACON ORANGE TIMEOUT ST (SAFETY) ×3 IMPLANT
COVER SURGICAL LIGHT HANDLE (MISCELLANEOUS) ×3 IMPLANT
DECANTER SPIKE VIAL GLASS SM (MISCELLANEOUS) ×3 IMPLANT
DRAPE INCISE IOBAN 66X45 STRL (DRAPES) IMPLANT
DRAPE PROXIMA HALF (DRAPES) ×2 IMPLANT
DRAPE WARM FLUID 44X44 (DRAPE) ×3 IMPLANT
DRSG TEGADERM 4X4.75 (GAUZE/BANDAGES/DRESSINGS) ×2 IMPLANT
ELECT CAUTERY BLADE 6.4 (BLADE) ×3 IMPLANT
ELECT REM PT RETURN 9FT ADLT (ELECTROSURGICAL) ×3
ELECTRODE REM PT RTRN 9FT ADLT (ELECTROSURGICAL) ×2 IMPLANT
EVACUATOR SILICONE 100CC (DRAIN) ×4 IMPLANT
GAUZE SPONGE 2X2 8PLY STRL LF (GAUZE/BANDAGES/DRESSINGS) ×1 IMPLANT
GEL ULTRASOUND 20GR AQUASONIC (MISCELLANEOUS) IMPLANT
GELPOINT ADV PLATFORM (ENDOMECHANICALS)
GLOVE BIOGEL PI IND STRL 8 (GLOVE) ×2 IMPLANT
GLOVE BIOGEL PI INDICATOR 8 (GLOVE) ×1
GLOVE ECLIPSE 8.0 STRL XLNG CF (GLOVE) ×6 IMPLANT
GOWN PREVENTION PLUS XLARGE (GOWN DISPOSABLE) ×3 IMPLANT
GOWN STRL NON-REIN LRG LVL3 (GOWN DISPOSABLE) ×3 IMPLANT
KIT BASIN OR (CUSTOM PROCEDURE TRAY) ×3 IMPLANT
KIT ROOM TURNOVER OR (KITS) ×3 IMPLANT
LEGGING LITHOTOMY PAIR STRL (DRAPES) ×3 IMPLANT
LIGASURE 5MM LAPAROSCOPIC (INSTRUMENTS) IMPLANT
NEEDLE 22X1 1/2 (OR ONLY) (NEEDLE) ×3 IMPLANT
NS IRRIG 1000ML POUR BTL (IV SOLUTION) ×6 IMPLANT
PAD ARMBOARD 7.5X6 YLW CONV (MISCELLANEOUS) ×6 IMPLANT
PENCIL BUTTON HOLSTER BLD 10FT (ELECTRODE) ×3 IMPLANT
PLATFORM STD W/COL CELL SVR (ENDOMECHANICALS) IMPLANT
RETRACTOR WND ALEXIS 18 MED (MISCELLANEOUS) IMPLANT
RTRCTR WOUND ALEXIS 18CM MED (MISCELLANEOUS)
SCALPEL HARMONIC ACE (MISCELLANEOUS) IMPLANT
SCISSORS LAP 5X35 DISP (ENDOMECHANICALS) IMPLANT
SEALER TISSUE G2 CVD JAW 35 (ENDOMECHANICALS) IMPLANT
SEALER TISSUE G2 CVD JAW 45CM (ENDOMECHANICALS)
SET IRRIG TUBING LAPAROSCOPIC (IRRIGATION / IRRIGATOR) ×3 IMPLANT
SLEEVE Z-THREAD 5X100MM (TROCAR) ×3 IMPLANT
SPECIMEN JAR LARGE (MISCELLANEOUS) IMPLANT
SPONGE GAUZE 2X2 STER 10/PKG (GAUZE/BANDAGES/DRESSINGS) ×1
SPONGE GAUZE 4X4 12PLY (GAUZE/BANDAGES/DRESSINGS) ×3 IMPLANT
STAPLER VISISTAT 35W (STAPLE) ×3 IMPLANT
SURGILUBE 2OZ TUBE FLIPTOP (MISCELLANEOUS) IMPLANT
SUT ETHILON 2 0 FS 18 (SUTURE) ×2 IMPLANT
SUT PDS AB 1 TP1 96 (SUTURE) ×6 IMPLANT
SUT PDS AB 3-0 SH 27 (SUTURE) ×2 IMPLANT
SUT PROLENE 2 0 CT2 30 (SUTURE) IMPLANT
SUT PROLENE 2 0 KS (SUTURE) IMPLANT
SUT SILK 2 0 (SUTURE) ×3
SUT SILK 2 0 SH CR/8 (SUTURE) ×3 IMPLANT
SUT SILK 2-0 18XBRD TIE 12 (SUTURE) ×2 IMPLANT
SUT SILK 3 0 (SUTURE) ×3
SUT SILK 3 0 SH CR/8 (SUTURE) ×3 IMPLANT
SUT SILK 3-0 18XBRD TIE 12 (SUTURE) ×2 IMPLANT
SUT VICRYL 0 TIES 12 18 (SUTURE) IMPLANT
SWAB CULTURE LIQUID MINI MALE (MISCELLANEOUS) ×2 IMPLANT
SYS LAPSCP GELPORT 120MM (MISCELLANEOUS)
SYSTEM LAPSCP GELPORT 120MM (MISCELLANEOUS) IMPLANT
TAPE CLOTH SURG 6X10 WHT LF (GAUZE/BANDAGES/DRESSINGS) ×2 IMPLANT
TAPE UMBILICAL 1/8 X36 TWILL (MISCELLANEOUS) IMPLANT
TOWEL OR 17X24 6PK STRL BLUE (TOWEL DISPOSABLE) ×3 IMPLANT
TOWEL OR 17X26 10 PK STRL BLUE (TOWEL DISPOSABLE) ×3 IMPLANT
TRAY FOLEY CATH 14FRSI W/METER (CATHETERS) ×1 IMPLANT
TRAY LAPAROSCOPIC (CUSTOM PROCEDURE TRAY) ×3 IMPLANT
TRAY PROCTOSCOPIC FIBER OPTIC (SET/KITS/TRAYS/PACK) ×1 IMPLANT
TROCAR FALLER TUNNELING (TROCAR) ×1 IMPLANT
TROCAR KII 12MM C0R29 THR SEP (TROCAR) IMPLANT
TROCAR XCEL NON-BLD 5MMX100MML (ENDOMECHANICALS) ×3 IMPLANT
TROCAR Z-THREAD FIOS 5X100MM (TROCAR) ×3 IMPLANT
TUBE ANAEROBIC SPECIMEN COL (MISCELLANEOUS) ×2 IMPLANT
TUBE CONNECTING 12X1/4 (SUCTIONS) ×3 IMPLANT
TUBING FILTER THERMOFLATOR (ELECTROSURGICAL) ×3 IMPLANT
WATER STERILE IRR 1000ML POUR (IV SOLUTION) IMPLANT
YANKAUER SUCT BULB TIP NO VENT (SUCTIONS) ×2 IMPLANT

## 2012-03-24 NOTE — Consult Note (Signed)
Name: Megan [name] MRN: [id number] DOB: 05-29-1927    LOS: 0  Referring Provider:  Oletta Lamas Reason for Referral:  Free air in abdomen  PULMONARY / CRITICAL CARE MEDICINE  HPI:  78 y/o female with DM and HTN presented to the [facility] ED on 5/23 with abdominal pain for three days and was found to have free air on a KUB.  She stated that she had been constipated for several days prior to admission.  Her last bowel movement was on 5/21 which was associated with a lot of straining and one episode of nausea and vomiting.  She was noted to be confused by her family on the day of admission and so they brought her in to the ED.    Past Medical History  Diagnosis Date  . Hypertension   . Diabetes mellitus    History reviewed. No pertinent past surgical history. Prior to Admission medications   Medication Sig Start Date End Date Taking? Authorizing Provider  amoxicillin (AMOXIL) 500 MG capsule Take 500 mg by mouth 3 (three) times daily. Started 03/16/12 ending 03/24/12 for 8 days   Yes Historical Provider, MD  Cholecalciferol (VITAMIN D PO) Take 1 tablet by mouth daily.   Yes Historical Provider, MD  clorazepate (TRANXENE) 7.5 MG tablet Take 7.5 mg by mouth at bedtime.   Yes Historical Provider, MD  glimepiride (AMARYL) 4 MG tablet Take 4 mg by mouth daily before breakfast.   Yes Historical Provider, MD  hyoscyamine (LEVSIN, ANASPAZ) 0.125 MG tablet Take 0.125 mg by mouth every 4 (four) hours as needed. For cramping/bloating/diarrhea   Yes Historical Provider, MD  Ibuprofen (ADVIL PO) Take 1-2 tablets by mouth every 6 (six) hours as needed. For pain. Pt can take 1 or 2 tablets for pain   Yes Historical Provider, MD  levothyroxine (SYNTHROID, LEVOTHROID) 50 MCG tablet Take 50 mcg by mouth daily.   Yes Historical Provider, MD  meloxicam (MOBIC) 15 MG tablet Take 15 mg by mouth daily.   Yes Historical Provider, MD   Allergies No Known Allergies  Family History History reviewed. No pertinent family  history. Social History  reports that she has never smoked. Her smokeless tobacco use includes Snuff. She reports that she does not drink alcohol or use illicit drugs.  Review Of Systems:   Gen: Denies fever, chills, weight change, fatigue, night sweats HEENT: Denies blurred vision, double vision, hearing loss, tinnitus, sinus congestion, rhinorrhea, sore throat, neck stiffness, dysphagia PULM: Denies shortness of breath, cough, sputum production, hemoptysis, wheezing CV: Denies chest pain, edema, orthopnea, paroxysmal nocturnal dyspnea, palpitations GI: Per HPI GU: Denies dysuria, hematuria, polyuria, oliguria, urethral discharge Endocrine: Denies hot or cold intolerance, polyuria, polyphagia or appetite change Derm: Denies rash, dry skin, scaling or peeling skin change Heme: Denies easy bruising, bleeding, bleeding gums Neuro: Denies headache, numbness, weakness, slurred speech, loss of memory or consciousness but notes confusion   Brief patient description:  78 y/o female with DM, HTN admitted on 5/23 with free peritoneal air likely related to recent constipation, possible sbo.  She has acute renal failure and a lactic acidosis both likely related to peritonitis.  Events Since Admission: 5/24 CT Abdomen >>  Current Status:  Vital Signs: Temp:  [98.6 F (37 C)-100.2 F (37.9 C)] 100 F (37.8 C) (05/23 2220) Pulse Rate:  [95-97] 97  (05/23 2220) Resp:  [22-33] 22  (05/23 2329) BP: (107-131)/(50-60) 108/56 mmHg (05/23 2329) SpO2:  [97 %-100 %] 99 % (05/23 2329)  Physical Examination: Gen:  chronically ill appearing but in no acute distress HEENT: NCAT, PERRL, EOMi, OP clear, mucus membranes profoundly dry PULM: CTA B CV: RRR, systolic murmur RUSB noted, no JVD AB: BS+, soft, nontender, no hsm Ext: warm, trace edema, no clubbing, no cyanosis Derm: no rash or skin breakdown Neuro: Awake and alert, oriented to hospital and situation, moves all four extremities  well   Principal Problem:  *Peritonitis Active Problems:  Intra-abdominal free air of unknown etiology  AKI (acute kidney injury)  Lactic acidosis  Severe sepsis  Delirium  Diabetes mellitus  Hypertension   ASSESSMENT AND PLAN  PULMONARY No results found for this basename: PHART:5,PCO2:5,PCO2ART:5,PO2ART:5,HCO3:5,O2SAT:5 in the last 168 hours Ventilator Settings: n/a   CXR:  Normal ETT:  n/a  A:  No acute respiratory issues P:   -monitor respiratory status -will revisit this evening if comes out of OR mechanically ventilated  CARDIOVASCULAR  Lab 03/24/12 2242 03/24/12 2207  TROPONINI -- <0.30  LATICACIDVEN 5.4* --  PROBNP -- --   ECG:  NSR, no st wave changes Lines: peripheral IV Echo: 2011: Mild PH (RVSP 40), mild aortic insuf, mild mitral insuf  A: Hypertension at baseline, currently normotensive after receiving 1.8 L IV NS in ED P:  -continue IVF at 100cc/hour until surgery -hold bp meds -will likely need CVL, will place if time pre-surgery  A: Grade 1 diastolic dysfunction, currently compensated P: -Monitor respiratory status closely with volume resuscitation  RENAL  Lab 03/24/12 2200  NA 139  K 4.5  CL 107  CO2 15*  BUN 37*  CREATININE 2.53*  CALCIUM 9.3  MG --  PHOS --   Intake/Output      05/23 0701 - 05/24 0700   I.V. 1800   Total Intake 1800   Net +1800        Foley:  5/23 >>  A:  AKI, due to severe sepsis and poor po intake P:   -IVF resuscitation -hold NSAIDs -monitor UOP -repeat BMET in AM -renal dosing of meds  GASTROINTESTINAL  Lab 03/24/12 2200  AST 24  ALT 7  ALKPHOS 39  BILITOT 1.0  PROT 5.5*  ALBUMIN 2.3*    A:  Peritonitis P:   -see antibiotics below -surgery seeing now, to OR tonight  HEMATOLOGIC  Lab 03/24/12 2200  HGB 12.7  HCT 39.1  PLT 158  INR 1.45  APTT 30   A:  No acute issues P:  -monitor cbc  INFECTIOUS  Lab 03/24/12 2241 03/24/12 2200  WBC -- 5.0  PROCALCITON >175.00 --    Cultures: 5/23 blood x2 >> 5/23 urine >>  Antibiotics: 5/23 Zosyn (peritonitis) >> 5/24 Diflucan (peritonitis) >>  A:  Peritonitis causing severe sepsis; not in septic shock; history of diverticulosis, worrisome for colonic perforation P:   -Agree with IVF given in ED -considering volume depletion, will continue IVF for now -Cont Zosyn, add diflucan as possible colonic perforation  ENDOCRINE No results found for this basename: GLUCAP:5 in the last 168 hours A:  DM 2   P:   -ICU hyperglycemia protocol  A: Hypothyroidism P: -IV synthroid -convert to pill when able to take po  NEUROLOGIC  A:  Delirium, due to severe sepsis; improving with IVF resuscitation in ED P:   -frequent orientation -minimize sedating meds -lights off at night, on during day  BEST PRACTICE / DISPOSITION - Level of Care:  ICU - Primary Service:  PCCM - Consultants:  Gen Surgery - Code Status:  Full - Diet:  npo -  DVT Px:  scd - GI Px:  N/a (will add if mechanically ventilated after case) - Skin Integrity:  normal - Social / Family:  Updated at bedside  Total CC time 45 minutes.    Max Fickle, M.D. Pulmonary and Critical Care Medicine [facility] Pager: [phone]  03/24/2012, 11:56 PM

## 2012-03-24 NOTE — ED Notes (Signed)
Patient states she has been feeling a little SOB the last few days, states she had pneumonia in the past.

## 2012-03-24 NOTE — ED Notes (Signed)
PER EMS- Patient was found very lethargic by daughter. Responded to loud voice. Patient was hypotensive upon arrival. Pt was cool, pale, and diaphoretic. CBG- 107. Initial BP 76/40, HR 110, sinus tach on monitor. Family reports history of HTN. Currently responding to voice. NAD, at this time.

## 2012-03-24 NOTE — ED Notes (Signed)
MD at bedside to discuss plan of care

## 2012-03-24 NOTE — ED Notes (Signed)
Patient transported to X-ray 

## 2012-03-24 NOTE — Progress Notes (Signed)
78yo female with Xray showing pneumoperitoneum to begin Zosyn.  Will give Zosyn 3.375g IV x1 in ED followed by Zosyn 2.25g IV Q6H for CrCl ~19 ml/hr.  Vernard Gambles, PharmD, BCPS 03/24/2012 11:46 PM

## 2012-03-24 NOTE — ED Provider Notes (Signed)
History     CSN: [id number]  Arrival date & time 03/24/12  2132   First MD Initiated Contact with Patient 03/24/12 2132      Chief Complaint  Patient presents with  . Altered Mental Status    (Consider location/radiation/quality/duration/timing/severity/associated sxs/prior treatment) HPI Comments: Level 5 caveat due to severe illness, change in mentation.  EMS reports found by daughter today less repsonsive, lethargic at home.  EMS reports accucheck is ok, speaking, but non-sensical at times.  No assymetric movements on their stroke screen.  Pt denies HA, CP, abd pain, back pain.  EMS reports that family told them pt has been this way for a few days, but non specific.  She did have some vomiting yesterday.  Pt appears to be dehydrated per EMS.  She was cool, diaphoretic per EMS.    Patient is a 78 y.o. female presenting with altered mental status. The history is provided by the patient, medical records and the EMS personnel.  Altered Mental Status    Past Medical History  Diagnosis Date  . Hypertension   . Diabetes mellitus     History reviewed. No pertinent past surgical history.  History reviewed. No pertinent family history.  History  Substance Use Topics  . Smoking status: Not on file  . Smokeless tobacco: Not on file  . Alcohol Use:     OB History    Grav Para Term Preterm Abortions TAB SAB Ect Mult Living                  Review of Systems  Unable to perform ROS: Mental status change  Psychiatric/Behavioral: Positive for altered mental status.    Allergies  Review of patient's allergies indicates no known allergies.  Home Medications   Current Outpatient Rx  Name Route Sig Dispense Refill  . AMOXICILLIN 500 MG PO CAPS Oral Take 500 mg by mouth 3 (three) times daily. Started 03/16/12 ending 03/24/12 for 8 days    . VITAMIN D PO Oral Take 1 tablet by mouth daily.    Marland Kitchen CLORAZEPATE DIPOTASSIUM 7.5 MG PO TABS Oral Take 7.5 mg by mouth at bedtime.    Marland Kitchen  GLIMEPIRIDE 4 MG PO TABS Oral Take 4 mg by mouth daily before breakfast.    . HYOSCYAMINE SULFATE 0.125 MG PO TABS Oral Take 0.125 mg by mouth every 4 (four) hours as needed. For cramping/bloating/diarrhea    . ADVIL PO Oral Take 1-2 tablets by mouth every 6 (six) hours as needed. For pain. Pt can take 1 or 2 tablets for pain    . LEVOTHYROXINE SODIUM 50 MCG PO TABS Oral Take 50 mcg by mouth daily.    . MELOXICAM 15 MG PO TABS Oral Take 15 mg by mouth daily.      BP 108/56  Pulse 97  Temp 100 F (37.8 C)  Resp 22  SpO2 99%  Physical Exam  Nursing note and vitals reviewed. Constitutional: She appears well-developed and well-nourished. She appears listless. She has a sickly appearance. She appears ill.  HENT:  Head: Normocephalic and atraumatic.  Mouth/Throat: Mucous membranes are pale and dry. No oropharyngeal exudate.  Eyes: No scleral icterus.  Neck: Normal range of motion. Neck supple.  Cardiovascular: Normal rate and regular rhythm.   No murmur heard. Pulmonary/Chest: Tachypnea noted. She has no wheezes.  Abdominal: Soft. Normal appearance. She exhibits no distension. Bowel sounds are decreased. There is tenderness in the suprapubic area. There is guarding. There is no rebound and no  CVA tenderness.  Neurological: She appears listless. She exhibits normal muscle tone. GCS eye subscore is 4. GCS verbal subscore is 4. GCS motor subscore is 6.       Generalize weakness to 4 extremities  Skin: No rash noted. No pallor.       No rash, cool, clammy skin diffusely    ED Course  Procedures (including critical care time)   CRITICAL CARE Performed by: Lear Ng.   Total critical care time: 30 min  Critical care time was exclusive of separately billable procedures and treating other patients.  Critical care was necessary to treat or prevent imminent or life-threatening deterioration.  Critical care was time spent personally by me on the following activities: development of  treatment plan with patient and/or surrogate as well as nursing, discussions with consultants, evaluation of patient's response to treatment, examination of patient, obtaining history from patient or surrogate, ordering and performing treatments and interventions, ordering and review of laboratory studies, ordering and review of radiographic studies, pulse oximetry and re-evaluation of patient's condition.   Labs Reviewed  COMPREHENSIVE METABOLIC PANEL - Abnormal; Notable for the following:    CO2 15 (*)    BUN 37 (*)    Creatinine, Ser 2.53 (*)    Total Protein 5.5 (*)    Albumin 2.3 (*)    GFR calc non Af Amer 17 (*)    GFR calc Af Amer 19 (*)    All other components within normal limits  URINALYSIS, ROUTINE W REFLEX MICROSCOPIC - Abnormal; Notable for the following:    Color, Urine AMBER (*) BIOCHEMICALS MAY BE AFFECTED BY COLOR   APPearance CLOUDY (*)    Glucose, UA 100 (*)    Bilirubin Urine SMALL (*)    Ketones, ur 15 (*)    Protein, ur 30 (*)    Leukocytes, UA TRACE (*)    All other components within normal limits  PROTIME-INR - Abnormal; Notable for the following:    Prothrombin Time 17.9 (*)    All other components within normal limits  LACTIC ACID, PLASMA - Abnormal; Notable for the following:    Lactic Acid, Venous 5.4 (*)    All other components within normal limits  URINE MICROSCOPIC-ADD ON - Abnormal; Notable for the following:    Casts HYALINE CASTS (*)    Crystals CA OXALATE CRYSTALS (*)    All other components within normal limits  CBC  DIFFERENTIAL  APTT  PROCALCITONIN  TROPONIN I  CULTURE, BLOOD (ROUTINE X 2)  CULTURE, BLOOD (ROUTINE X 2)  URINE CULTURE  BLOOD GAS, VENOUS   Ct Head Wo Contrast  03/24/2012  *RADIOLOGY REPORT*  Clinical Data: Acute mental status changes with lethargy.  CT HEAD WITHOUT CONTRAST  Technique:  Contiguous axial images were obtained from the base of the skull through the vertex without contrast.  Comparison: MRI brain 01/15/2010,  06/06/2008.  Unenhanced cranial CT 01/13/2010.  Findings: Moderate cortical and deep atrophy, mild cerebellar atrophy, and severe changes of small vessel disease of the white matter diffusely, unchanged.  Physiologic calcifications in the right basal ganglia, unchanged. No mass lesion.  No midline shift. No acute hemorrhage or hematoma.  No extra-axial fluid collections. No evidence of acute infarction.  No significant interval change.  Severe changes of hyperostosis frontalis interna bilaterally. Visualized paranasal sinuses, mastoid air cells, and middle ear cavities well-aerated.  Severe bilateral carotid siphon and vertebral artery atherosclerosis.  IMPRESSION:  1.  No acute intracranial abnormality. 2.  Stable moderate generalized  atrophy and severe chronic microvascular ischemic changes of the white matter diffusely.  Original Report Authenticated By: Arnell Sieving, M.D.   Dg Chest Portable 1 View  03/24/2012  *RADIOLOGY REPORT*  Clinical Data: Altered mental status, hypertension  PORTABLE CHEST - 1 VIEW  Comparison: 01/13/2010  Findings: Cardiomegaly.  Central vascular congestion. Aortic arch atherosclerosis.  No overt edema or pneumothorax.  Hemidiaphragm elevation obscures the lung bases.  Small effusions not excluded. Mild lung base opacities.  Osteopenia.  Shoulder degenerative changes and multilevel degenerative changes of the thoracic vertebrae.  IMPRESSION: Prominent cardiomediastinal contours, similar to prior.  Mild lung base opacities; atelectasis versus infiltrate.  Original Report Authenticated By: Waneta Martins, M.D.   Dg Abd 2 Views  03/24/2012  *RADIOLOGY REPORT*  Clinical Data: Abdominal pain and tenderness.  ABDOMEN - 2 VIEW  Comparison: CT abdomen pelvis 12/30/2008.  Findings: Bowel gas pattern unremarkable without evidence of obstruction or significant ileus.  Free intraperitoneal air identified adjacent to the liver on the lateral decubitus view. Aorto-iliac  atherosclerosis.  No visible opaque urinary tract calculi.  Osteopenia, severe degenerative changes involving the lumbar spine, and severe degenerative changes involving the right sacroiliac joint and both hips.  IMPRESSION: Pneumoperitoneum.  Critical Value/emergent results were called by telephone at the time of interpretation on 03/24/2012  at 2315 hours  to  Dr. Oletta Lamas of the emergency department, who verbally acknowledged these results.  Original Report Authenticated By: Arnell Sieving, M.D.   I reviewed the above films myself.    1. Sepsis   2. Peritoneal free air   3. Abdominal pain   4. Renal insufficiency   5. Dehydration     O2 sat is 100% which is normal.    ECG at time 21:42 shows NSR at rate 94, normal axis, no ST or T wave abn's.  No sig change compared to 01/14/10.     11:16 PM I discussed with radiologist.  2 view abd shows small amount of free air.  I suspect with lower abd tenderness, she probably has perf diverticulitis.  Will start IV zosyn, discuss with intensivist and obtain CT of abd.  Will call code sepsis.  Renal failure, dehydration also evident.     11:54 PM I spoke to PCCM who has seen pt.  I have spoken to Dr. Michaell Cowing with general surgery who will see her.  Pt is full code.  I spoke to daughter as well.  Pt clinically seems more alert and improved clinically after IVF's.     MDM  Pt was hypotensive and tachycardic for EMS, here seems margianlly improved, ill appearing, no fever.  Very dehydrated clinically, dark yellow urine.  Suprapubic tenderness could be UTI.  No rebound.  Will need IVF's, obtain cultures, admission.          Gavin Pound. Oletta Lamas, MD 03/24/12 2355

## 2012-03-25 ENCOUNTER — Encounter (HOSPITAL_COMMUNITY): Payer: Self-pay | Admitting: Certified Registered Nurse Anesthetist

## 2012-03-25 ENCOUNTER — Encounter (HOSPITAL_COMMUNITY): Payer: Self-pay | Admitting: Pulmonary Disease

## 2012-03-25 ENCOUNTER — Inpatient Hospital Stay (HOSPITAL_COMMUNITY): Payer: Medicare Other | Admitting: Certified Registered Nurse Anesthetist

## 2012-03-25 ENCOUNTER — Inpatient Hospital Stay (HOSPITAL_COMMUNITY): Payer: Medicare Other

## 2012-03-25 DIAGNOSIS — K668 Other specified disorders of peritoneum: Secondary | ICD-10-CM

## 2012-03-25 DIAGNOSIS — E1129 Type 2 diabetes mellitus with other diabetic kidney complication: Secondary | ICD-10-CM | POA: Diagnosis present

## 2012-03-25 DIAGNOSIS — K251 Acute gastric ulcer with perforation: Secondary | ICD-10-CM

## 2012-03-25 DIAGNOSIS — A419 Sepsis, unspecified organism: Principal | ICD-10-CM

## 2012-03-25 DIAGNOSIS — I519 Heart disease, unspecified: Secondary | ICD-10-CM

## 2012-03-25 DIAGNOSIS — E119 Type 2 diabetes mellitus without complications: Secondary | ICD-10-CM

## 2012-03-25 DIAGNOSIS — E872 Acidosis: Secondary | ICD-10-CM

## 2012-03-25 DIAGNOSIS — R404 Transient alteration of awareness: Secondary | ICD-10-CM

## 2012-03-25 DIAGNOSIS — E039 Hypothyroidism, unspecified: Secondary | ICD-10-CM

## 2012-03-25 DIAGNOSIS — K659 Peritonitis, unspecified: Secondary | ICD-10-CM

## 2012-03-25 DIAGNOSIS — K651 Peritoneal abscess: Secondary | ICD-10-CM

## 2012-03-25 DIAGNOSIS — N179 Acute kidney failure, unspecified: Secondary | ICD-10-CM

## 2012-03-25 DIAGNOSIS — R109 Unspecified abdominal pain: Secondary | ICD-10-CM

## 2012-03-25 HISTORY — PX: DIAGNOSTIC LAPAROSCOPY: SUR761

## 2012-03-25 HISTORY — PX: ABCESS DRAINAGE: SHX399

## 2012-03-25 HISTORY — DX: Heart disease, unspecified: I51.9

## 2012-03-25 HISTORY — DX: Hypothyroidism, unspecified: E03.9

## 2012-03-25 LAB — GLUCOSE, CAPILLARY: Glucose-Capillary: 116 mg/dL — ABNORMAL HIGH (ref 70–99)

## 2012-03-25 LAB — POCT I-STAT 3, VENOUS BLOOD GAS (G3P V)
Bicarbonate: 21.1 mEq/L (ref 20.0–24.0)
O2 Saturation: 38 %
TCO2: 22 mmol/L (ref 0–100)
pCO2, Ven: 40.8 mmHg — ABNORMAL LOW (ref 45.0–50.0)
pO2, Ven: 24 mmHg — CL (ref 30.0–45.0)

## 2012-03-25 LAB — CARDIAC PANEL(CRET KIN+CKTOT+MB+TROPI)
Relative Index: 1.7 (ref 0.0–2.5)
Total CK: 473 U/L — ABNORMAL HIGH (ref 7–177)
Troponin I: <0.3 ng/mL (ref <0.30)

## 2012-03-25 LAB — BASIC METABOLIC PANEL
BUN: 37 mg/dL — ABNORMAL HIGH (ref 6–23)
BUN: 34 mg/dL — ABNORMAL HIGH (ref 6–23)
Calcium: 8.4 mg/dL (ref 8.4–10.5)
Calcium: 7.9 mg/dL — ABNORMAL LOW (ref 8.4–10.5)
Creatinine, Ser: 1.63 mg/dL — ABNORMAL HIGH (ref 0.50–1.10)
GFR calc Af Amer: 22 mL/min — ABNORMAL LOW (ref >90)
GFR calc Af Amer: 33 mL/min — ABNORMAL LOW (ref >90)
GFR calc non Af Amer: 19 mL/min — ABNORMAL LOW (ref >90)
GFR calc non Af Amer: 28 mL/min — ABNORMAL LOW (ref >90)
Glucose, Bld: 133 mg/dL — ABNORMAL HIGH (ref 70–99)
Potassium: 4.1 mEq/L (ref 3.5–5.1)
Potassium: 3.8 mEq/L (ref 3.5–5.1)
Sodium: 140 mEq/L (ref 135–145)

## 2012-03-25 LAB — URINE CULTURE
Colony Count: NO GROWTH
Culture  Setup Time: 201305232221
Culture: NO GROWTH
Special Requests: NORMAL

## 2012-03-25 LAB — BLOOD GAS, ARTERIAL
Acid-base deficit: 8.1 mmol/L — ABNORMAL HIGH (ref 0.0–2.0)
Drawn by: 31843
FIO2: 1 %
MECHVT: 500 mL
O2 Saturation: 99.8 %
Patient temperature: 98.6
RATE: 15 resp/min

## 2012-03-25 LAB — CARBOXYHEMOGLOBIN
Carboxyhemoglobin: 0.9 % (ref 0.5–1.5)
O2 Saturation: 62.9 %

## 2012-03-25 LAB — POCT I-STAT 7, (LYTES, BLD GAS, ICA,H+H)
Acid-base deficit: 6 mmol/L — ABNORMAL HIGH (ref 0.0–2.0)
Bicarbonate: 20.1 mEq/L (ref 20.0–24.0)
Calcium, Ion: 1.25 mmol/L (ref 1.12–1.32)
HCT: 35 % — ABNORMAL LOW (ref 36.0–46.0)
Patient temperature: 37
pCO2 arterial: 42.2 mmHg (ref 35.0–45.0)
pO2, Arterial: 457 mmHg — ABNORMAL HIGH (ref 80.0–100.0)

## 2012-03-25 LAB — CBC
MCH: 28.8 pg (ref 26.0–34.0)
MCHC: 32.9 g/dL (ref 30.0–36.0)
RDW: 14.6 % (ref 11.5–15.5)

## 2012-03-25 LAB — CORTISOL: Cortisol, Plasma: 46 ug/dL

## 2012-03-25 LAB — POCT I-STAT GLUCOSE: Operator id: 153281

## 2012-03-25 LAB — LACTIC ACID, PLASMA: Lactic Acid, Venous: 4 mmol/L — ABNORMAL HIGH (ref 0.5–2.2)

## 2012-03-25 MED ORDER — KCL IN DEXTROSE-NACL 20-5-0.45 MEQ/L-%-% IV SOLN
INTRAVENOUS | Status: DC
  Administered 2012-03-25: 100 mL/h via INTRAVENOUS
  Filled 2012-03-25 (×2): qty 1000

## 2012-03-25 MED ORDER — ROCURONIUM BROMIDE 100 MG/10ML IV SOLN
INTRAVENOUS | Status: DC | PRN
  Administered 2012-03-25: 30 mg via INTRAVENOUS

## 2012-03-25 MED ORDER — WHITE PETROLATUM GEL
Freq: Two times a day (BID) | Status: DC
  Administered 2012-03-25: 10:00:00 via TOPICAL
  Administered 2012-03-25: 1 via TOPICAL
  Administered 2012-03-26 – 2012-03-30 (×7): via TOPICAL
  Filled 2012-03-25 (×2): qty 28.35
  Filled 2012-03-25: qty 5
  Filled 2012-03-25 (×6): qty 28.35
  Filled 2012-03-25 (×2): qty 5
  Filled 2012-03-25 (×2): qty 28.35
  Filled 2012-03-25: qty 5

## 2012-03-25 MED ORDER — PIPERACILLIN-TAZOBACTAM IN DEX 2-0.25 GM/50ML IV SOLN
2.2500 g | Freq: Four times a day (QID) | INTRAVENOUS | Status: DC
  Administered 2012-03-25 – 2012-03-26 (×5): 2.25 g via INTRAVENOUS
  Filled 2012-03-25 (×7): qty 50

## 2012-03-25 MED ORDER — FENTANYL BOLUS VIA INFUSION
20.0000 ug | INTRAVENOUS | Status: DC | PRN
  Filled 2012-03-25: qty 50

## 2012-03-25 MED ORDER — LACTATED RINGERS IV SOLN: INTRAVENOUS | Status: DC

## 2012-03-25 MED ORDER — CHLORHEXIDINE GLUCONATE 4 % EX LIQD
1.0000 "application " | Freq: Once | CUTANEOUS | Status: DC
  Filled 2012-03-25: qty 15

## 2012-03-25 MED ORDER — SODIUM CHLORIDE 0.9 % IR SOLN
Status: DC | PRN
  Administered 2012-03-25: 3000 mL

## 2012-03-25 MED ORDER — HEPARIN SODIUM (PORCINE) 5000 UNIT/ML IJ SOLN
5000.0000 [IU] | Freq: Three times a day (TID) | INTRAMUSCULAR | Status: DC
  Administered 2012-03-26 – 2012-04-01 (×16): 5000 [IU] via SUBCUTANEOUS
  Filled 2012-03-25 (×22): qty 1

## 2012-03-25 MED ORDER — FLUCONAZOLE IN SODIUM CHLORIDE 200-0.9 MG/100ML-% IV SOLN
200.0000 mg | INTRAVENOUS | Status: DC
  Administered 2012-03-26 – 2012-03-29 (×4): 200 mg via INTRAVENOUS
  Filled 2012-03-25 (×4): qty 100

## 2012-03-25 MED ORDER — LIP MEDEX EX OINT
1.0000 "application " | TOPICAL_OINTMENT | Freq: Two times a day (BID) | CUTANEOUS | Status: DC
  Filled 2012-03-25: qty 7

## 2012-03-25 MED ORDER — PROMETHAZINE HCL 25 MG/ML IJ SOLN: 6.2500 mg | Freq: Four times a day (QID) | INTRAMUSCULAR | Status: DC | PRN

## 2012-03-25 MED ORDER — SODIUM CHLORIDE 0.9 % IV SOLN: INTRAVENOUS | Status: DC

## 2012-03-25 MED ORDER — CLINDAMYCIN PHOSPHATE 300 MG/2ML IJ SOLN
900.0000 mg | Freq: Once | INTRAMUSCULAR | Status: DC
  Filled 2012-03-25: qty 6

## 2012-03-25 MED ORDER — BISACODYL 10 MG RE SUPP: 10.0000 mg | Freq: Two times a day (BID) | RECTAL | Status: DC | PRN

## 2012-03-25 MED ORDER — LEVOTHYROXINE SODIUM 100 MCG IV SOLR
25.0000 ug | Freq: Every day | INTRAVENOUS | Status: DC
  Administered 2012-03-25 – 2012-03-27 (×3): 26 ug via INTRAVENOUS
  Administered 2012-03-28: 10:00:00 via INTRAVENOUS
  Administered 2012-03-29 – 2012-03-31 (×3): 26 ug via INTRAVENOUS
  Filled 2012-03-25 (×9): qty 1.3

## 2012-03-25 MED ORDER — MENTHOL 3 MG MT LOZG
1.0000 | LOZENGE | OROMUCOSAL | Status: DC | PRN
  Filled 2012-03-25: qty 9

## 2012-03-25 MED ORDER — CLINDAMYCIN PHOSPHATE 900 MG/50ML IV SOLN
900.0000 mg | Freq: Once | INTRAVENOUS | Status: AC
  Administered 2012-03-25: 900 mg via INTRAVENOUS
  Filled 2012-03-25 (×2): qty 50

## 2012-03-25 MED ORDER — FENTANYL CITRATE 0.05 MG/ML IJ SOLN
25.0000 ug | INTRAMUSCULAR | Status: DC | PRN
  Administered 2012-03-25 – 2012-03-27 (×5): 50 ug via INTRAVENOUS
  Filled 2012-03-25 (×5): qty 2

## 2012-03-25 MED ORDER — ACETAMINOPHEN 650 MG RE SUPP: 650.0000 mg | Freq: Four times a day (QID) | RECTAL | Status: DC | PRN

## 2012-03-25 MED ORDER — SODIUM CHLORIDE 0.9 % IV SOLN
10.0000 mg | INTRAVENOUS | Status: DC | PRN
  Administered 2012-03-25: 100 ug/min via INTRAVENOUS

## 2012-03-25 MED ORDER — PANTOPRAZOLE SODIUM 40 MG IV SOLR
40.0000 mg | Freq: Every day | INTRAVENOUS | Status: DC
  Administered 2012-03-25: 40 mg via INTRAVENOUS
  Filled 2012-03-25 (×2): qty 40

## 2012-03-25 MED ORDER — BUPIVACAINE-EPINEPHRINE 0.25% -1:200000 IJ SOLN
INTRAMUSCULAR | Status: DC | PRN
  Administered 2012-03-25: 12 mL

## 2012-03-25 MED ORDER — ONDANSETRON 8 MG/NS 50 ML IVPB
8.0000 mg | Freq: Four times a day (QID) | INTRAVENOUS | Status: DC | PRN
  Filled 2012-03-25: qty 8

## 2012-03-25 MED ORDER — DOBUTAMINE IN D5W 4-5 MG/ML-% IV SOLN
2.5000 ug/kg/min | INTRAVENOUS | Status: DC | PRN
  Filled 2012-03-25: qty 250

## 2012-03-25 MED ORDER — ONDANSETRON HCL 4 MG/2ML IJ SOLN: 4.0000 mg | Freq: Four times a day (QID) | INTRAMUSCULAR | Status: DC | PRN

## 2012-03-25 MED ORDER — LACTATED RINGERS IV SOLN
INTRAVENOUS | Status: DC | PRN
  Administered 2012-03-25 (×2): via INTRAVENOUS

## 2012-03-25 MED ORDER — MAGIC MOUTHWASH
15.0000 mL | Freq: Four times a day (QID) | ORAL | Status: DC | PRN
  Filled 2012-03-25: qty 15

## 2012-03-25 MED ORDER — DEXTROSE 10 % IV SOLN: INTRAVENOUS | Status: DC | PRN

## 2012-03-25 MED ORDER — PIPERACILLIN-TAZOBACTAM 3.375 G IVPB 30 MIN
3.3750 g | Freq: Once | INTRAVENOUS | Status: DC
  Filled 2012-03-25: qty 50

## 2012-03-25 MED ORDER — SODIUM CHLORIDE 0.9 % IV BOLUS (SEPSIS)
500.0000 mL | Freq: Once | INTRAVENOUS | Status: AC
  Administered 2012-03-25: 500 mL via INTRAVENOUS

## 2012-03-25 MED ORDER — HYDROMORPHONE HCL PF 1 MG/ML IJ SOLN: 0.2500 mg | INTRAMUSCULAR | Status: DC | PRN

## 2012-03-25 MED ORDER — FLUCONAZOLE IN SODIUM CHLORIDE 200-0.9 MG/100ML-% IV SOLN
200.0000 mg | Freq: Once | INTRAVENOUS | Status: AC
  Administered 2012-03-25: 200 mg via INTRAVENOUS
  Filled 2012-03-25: qty 100

## 2012-03-25 MED ORDER — INSULIN ASPART 100 UNIT/ML ~~LOC~~ SOLN: 0.0000 [IU] | SUBCUTANEOUS | Status: DC

## 2012-03-25 MED ORDER — LORAZEPAM BOLUS VIA INFUSION: 0.5000 mg | Freq: Three times a day (TID) | INTRAVENOUS | Status: DC | PRN

## 2012-03-25 MED ORDER — FENTANYL CITRATE 0.05 MG/ML IJ SOLN
INTRAMUSCULAR | Status: DC | PRN
  Administered 2012-03-25 (×5): 50 ug via INTRAVENOUS
  Administered 2012-03-25: 100 ug via INTRAVENOUS

## 2012-03-25 MED ORDER — SACCHAROMYCES BOULARDII 250 MG PO CAPS
250.0000 mg | ORAL_CAPSULE | Freq: Two times a day (BID) | ORAL | Status: DC
  Administered 2012-03-25 – 2012-04-01 (×15): 250 mg via ORAL
  Filled 2012-03-25 (×16): qty 1

## 2012-03-25 MED ORDER — PANTOPRAZOLE SODIUM 40 MG IV SOLR
40.0000 mg | Freq: Two times a day (BID) | INTRAVENOUS | Status: DC
  Administered 2012-03-25 – 2012-03-30 (×10): 40 mg via INTRAVENOUS
  Filled 2012-03-25 (×15): qty 40

## 2012-03-25 MED ORDER — ETOMIDATE 2 MG/ML IV SOLN
INTRAVENOUS | Status: DC | PRN
  Administered 2012-03-25: 12 mg via INTRAVENOUS

## 2012-03-25 MED ORDER — NOREPINEPHRINE BITARTRATE 1 MG/ML IJ SOLN
2.0000 ug/min | INTRAMUSCULAR | Status: DC | PRN
  Administered 2012-03-25: 10 ug/min via INTRAVENOUS
  Filled 2012-03-25 (×3): qty 4

## 2012-03-25 MED ORDER — SODIUM CHLORIDE 0.9 % IR SOLN
80.0000 mg | Status: DC
  Filled 2012-03-25 (×9): qty 2

## 2012-03-25 MED ORDER — INSULIN ASPART 100 UNIT/ML ~~LOC~~ SOLN
0.0000 [IU] | SUBCUTANEOUS | Status: DC
  Administered 2012-03-26 (×2): 1 [IU] via SUBCUTANEOUS

## 2012-03-25 MED ORDER — SODIUM CHLORIDE 0.9 % IV SOLN
INTRAVENOUS | Status: DC
  Administered 2012-03-25 (×2): via INTRAVENOUS

## 2012-03-25 MED ORDER — SUCCINYLCHOLINE CHLORIDE 20 MG/ML IJ SOLN
INTRAMUSCULAR | Status: DC | PRN
  Administered 2012-03-25: 80 mg via INTRAVENOUS

## 2012-03-25 MED ORDER — LORAZEPAM 2 MG/ML IJ SOLN: 0.5000 mg | Freq: Three times a day (TID) | INTRAMUSCULAR | Status: DC | PRN

## 2012-03-25 MED ORDER — DIPHENHYDRAMINE HCL 50 MG/ML IJ SOLN: 12.5000 mg | Freq: Four times a day (QID) | INTRAMUSCULAR | Status: DC | PRN

## 2012-03-25 MED ORDER — MIDAZOLAM HCL 2 MG/2ML IJ SOLN
1.0000 mg | INTRAMUSCULAR | Status: DC | PRN
  Administered 2012-03-25 (×2): 2 mg via INTRAVENOUS
  Filled 2012-03-25 (×2): qty 2

## 2012-03-25 MED ORDER — DEXTROSE 5 % IV SOLN
INTRAVENOUS | Status: DC
  Administered 2012-03-25: 15:00:00 via INTRAVENOUS
  Filled 2012-03-25 (×2): qty 150

## 2012-03-25 MED ORDER — SODIUM CHLORIDE 0.9 % IV BOLUS (SEPSIS)
500.0000 mL | INTRAVENOUS | Status: DC | PRN
  Administered 2012-03-25 (×2): 500 mL via INTRAVENOUS

## 2012-03-25 MED ORDER — SODIUM CHLORIDE 0.9 % IV SOLN: 250.0000 mL | INTRAVENOUS | Status: DC | PRN

## 2012-03-25 MED ORDER — PHENOL 1.4 % MT LIQD
2.0000 | OROMUCOSAL | Status: DC | PRN
  Filled 2012-03-25: qty 177

## 2012-03-25 MED ORDER — SODIUM CHLORIDE 0.9 % IV BOLUS (SEPSIS): 25.0000 mL/kg | Freq: Once | INTRAVENOUS | Status: DC

## 2012-03-25 MED ORDER — BISACODYL 10 MG RE SUPP
10.0000 mg | Freq: Every day | RECTAL | Status: DC
  Administered 2012-03-26 – 2012-03-28 (×2): 10 mg via RECTAL
  Filled 2012-03-25 (×5): qty 1

## 2012-03-25 NOTE — ED Notes (Signed)
Patient transported to CT 

## 2012-03-25 NOTE — Op Note (Signed)
03/24/2012 - 03/25/2012  3:24 AM  PATIENT:  Megan [name]  78 y.o. female  Patient Care Team: Pola Corn, MD as PCP - General (Cardiology) Graylin Shiver, MD as Consulting Physician (Gastroenterology)  PRE-OPERATIVE DIAGNOSIS:  Intra-abdominal free air of unknown etiology [568.89]  POST-OPERATIVE DIAGNOSIS:  Perforated pyloric channel gastric ulcer  PROCEDURE:  Procedure(s): LAPAROSCOPY DIAGNOSTIC REPAIR OF PERFORATED ULCER with omental patch I&D of abdominal & pelvic abscesses  SURGEON:  Surgeon(s): Ardeth Sportsman, MD  ASSISTANT: none   ANESTHESIA:   local and general  EBL:  Total I/O In: 2800 [I.V.:2800] Out: 140 [Urine:110; Blood:30]  Delay start of Pharmacological VTE agent (>24hrs) due to surgical blood loss or risk of bleeding:  no  DRAINS:   RUQ: (19Fr) Blake drain(s) going down to the pelvis  LUQ: 19Fr Blake drain going over the omental patch & into hepatic fossa  SPECIMEN:  Source of Specimen:  Peritoneal peel   DISPOSITION OF SPECIMEN:  Microbiology  COUNTS:  YES  PLAN OF CARE: Admit to inpatient   PATIENT DISPOSITION:  ICU - intubated and critically ill.  INDICATION: Pleasant elderly female with three-day history of worsening mental status and abdominal pain. Was brought the emergency room with worsening mental status today. Workup was negative for stroke. Patient complains of abdominal pain. X-ray showed a pocket of free air. CT scan confirmed upper abdominal pockets of free air. Suspicious for perforated gastric ulcer. Diverticulitis less likely. She had a colonoscopy done 2 years ago which disproved any tumor. No prior history of diverticulitis.  She came in in renal failure. She came in shock. Little more stable with hydration. I strongly recommended operative expiration to the patient and her daughters/granddaughter:  The anatomy & physiology of the digestive tract was discussed.  The pathophysiology of perforation was discussed.  Differential  diagnosis such as perforated ulcer or colon, etc was discussed.   Natural history risks without surgery such as death was discussed.  I recommended abdominal exploration to diagnose & treat the source of the problem.  Laparoscopic & open techniques were discussed.   Risks such as bleeding, infection, abscess, leak, reoperation, bowel resection, possible ostomy, hernia, heart attack, death, and other risks were discussed.   The risks of no intervention will lead to serious problems including death.   I expressed a good likelihood that surgery will address the problem.    Goals of post-operative recovery were discussed as well.  We will work to minimize complications although risks in an emergent setting are high.   Questions were answered.  The patient expressed understanding & wishes to proceed with surgery.       OR FINDINGS: She had in obvious perforation near the pyloric channel of the stomach. She had massive contamination with phlegmon n.p.o. Bilious gray discoloration all quadrants. She had obvious abscess pocket in the right upper quadrant region. She had another abscess pocket in the pelvis. There is no evidence of carcinomatosis. No evidence of metastatic disease.  DESCRIPTION:   Informed consent was confirmed.  The patient underwent general anaesthesia without difficulty.  The patient was positioned appropriately.  VTE prevention in place.  The patient's abdomen was clipped, prepped, & draped in a sterile fashion.  Surgical timeout confirmed our plan.  The patient was positioned in reverse Trendelenburg.  Abdominal entry was gained using optical entry technique in the right upper abdomen.  Entry was clean.  I induced carbon dioxide insufflation.  Camera inspection revealed no injury.  Extra ports were  carefully placed under direct laparoscopic visualization.  She had massive peritonitis. Bilious & brackish fluid. Grayish yellow inflammatory peel in many regions especially in the right  midabdomen and right upper quadrant. I sent inflammatory peel for culture.  I could easily locate a perforation on the anterior pylorus. There was no tissue around it trying to patch it off.  It did copious irrigation over 10 L of saline in all 4 quadrants and the pelvis. I was able to break up some interloop abscesses as well as a pelvic abscess. By the end of the serial irrigations, the peritonitis had cleared up better.  I did a final irrigation of 1 L containing 240 mg of gentamicin and 900 mg of clindamycin. I left that liter of antibiotic solution in the peritoneal cavity while I was doing the omental patch.  I found a tongue of healthy omentum in the mid transverse colon. It easily reached up over the perforation. I did a classic Graham omental patch using a 3-0 PDS interrupted stitches x3. A took a bite on the superior edge of the ulcer about a centimeter away and then went on the in inferior edge. I did 3 interrupted stitches. I brought the tongue of omentum and laid it over the ulcer. I tied the stitches down to  plug up the ulcer perforation with the omentum in the classic [facility] patch fashion.  It provided good closure.  I did further  Inspection of the peritoneal cavity. I saw no  serosal injury,perforation,  or other abnormalities.  There was no carcinomatosis. She did have some areas of globular fat in the preperitoneum on the adominal wall but it was yellow and soft and fatty. Not consistent with an articular carcinomatosis. Omentum was mildly inflamed but otherwise soft. The rest of the organs were soft. Mesentery was soft. I went ahead and placed drains as noted above.  I allowed the remaining peritoneal antibiotic irrigation to come out of the drains.   I secured the drain sites with nylon stitch.  I closed the remaining wounds using 4 Monocryl stitch.  Sterile dressings were applied.  Because of her renal failure and shock, we will leave her intubated into the intensive care unit. We'll  follow her closely. Critical care is already involved. I am about to look for family to discuss operative findings        \

## 2012-03-25 NOTE — Progress Notes (Signed)
INITIAL ADULT NUTRITION ASSESSMENT Date: 03/25/2012   Time: 10:17 AM  Reason for Assessment: VDRF; Low Braden  ASSESSMENT: Female 78 y.o.  Dx: Peritonitis; duodenal perforation; S/P laparoscopic repair of perforated ulcer with omental patch  Hx:  Past Medical History  Diagnosis Date  . Hypertension   . Diabetes mellitus   . Diastolic dysfunction, left ventricle by ECHO 2011 03/25/2012  . Hypothyroidism 03/25/2012    Related Meds:  Scheduled Meds:   . bisacodyl  10 mg Rectal Daily  . chlorhexidine  1 application Topical Once  . chlorhexidine  1 application Topical Once  . clindamycin (CLEOCIN) IV  900 mg Intravenous Once  . fluconazole (DIFLUCAN) IV  200 mg Intravenous Once  . fluconazole (DIFLUCAN) IV  200 mg Intravenous Q24H  . heparin  5,000 Units Subcutaneous Q8H  . insulin aspart  0-4 Units Subcutaneous Q4H  . levothyroxine  26 mcg Intravenous Daily  . pantoprazole (PROTONIX) IV  40 mg Intravenous Q12H  . pantoprazole (PROTONIX) IV  40 mg Intravenous Daily  . piperacillin-tazobactam (ZOSYN)  IV  2.25 g Intravenous Q6H  . piperacillin-tazobactam  3.375 g Intravenous Once  . saccharomyces boulardii  250 mg Oral BID  . sodium chloride  1,000 mL Intravenous Once  . sodium chloride  500 mL Intravenous Once  . white petrolatum   Topical BID  . DISCONTD: clindamycin  900 mg Intravenous Once  . DISCONTD: gentamycin 80 mg in 0.9% normal saline 250 mL irrigation  80 mg Irrigation Q5 min  . DISCONTD: insulin aspart  0-15 Units Subcutaneous Q4H  . DISCONTD: lip balm  1 application Topical BID  . DISCONTD: piperacillin-tazobactam  3.375 g Intravenous Once  . DISCONTD: sodium chloride  25 mL/kg Intravenous Once   Continuous Infusions:   . sodium chloride Stopped (03/25/12 1000)  . dextrose    . dextrose 5 % and 0.45 % NaCl with KCl 20 mEq/L 100 mL/hr (03/25/12 0940)  . DISCONTD: sodium chloride    . DISCONTD: lactated ringers     PRN Meds:.acetaminophen, bisacodyl, dextrose,  diphenhydrAMINE, DOBUTamine, fentaNYL, HYDROmorphone (DILAUDID) injection, magic mouthwash, menthol-cetylpyridinium, midazolam, norepinephrine (LEVOPHED) Adult infusion, ondansetron (ZOFRAN) IV, ondansetron, phenol, promethazine, sodium chloride, DISCONTD: sodium chloride, DISCONTD: bupivacaine-EPINEPHrine, DISCONTD: fentaNYL, DISCONTD: LORazepam, DISCONTD: LORazepam, DISCONTD: ondansetron (ZOFRAN) IV DISCONTD: sodium chloride irrigation   Ht: 5\' 8"  (172.7 cm)  Wt: 149 lb 14.6 oz (68 kg)  Ideal Wt: 63.6 kg % Ideal Wt: 107%  Wt Readings from Last 15 Encounters:  03/25/12 149 lb 14.6 oz (68 kg)  03/25/12 149 lb 14.6 oz (68 kg)    Usual Wt: per daughter, patient's weight was stable PTA  Body mass index is 22.79 kg/(m^2).  Food/Nutrition Related Hx: Patient had a good appetite and ate very well PTA per daughter.  No nutritional concerns PTA  Labs:  CMP     Component Value Date/Time   NA 140 03/25/2012 0430   K 4.1 03/25/2012 0430   CL 107 03/25/2012 0430   CO2 17* 03/25/2012 0430   GLUCOSE 109* 03/25/2012 0430   BUN 37* 03/25/2012 0430   CREATININE 2.25* 03/25/2012 0430   CALCIUM 8.4 03/25/2012 0430   PROT 5.5* 03/24/2012 2200   ALBUMIN 2.3* 03/24/2012 2200   AST 24 03/24/2012 2200   ALT 7 03/24/2012 2200   ALKPHOS 39 03/24/2012 2200   BILITOT 1.0 03/24/2012 2200   GFRNONAA 19* 03/25/2012 0430   GFRAA 22* 03/25/2012 0430    CBG (last 3)   Basename 03/25/12 4098  GLUCAP 118*     Intake/Output Summary (Last 24 hours) at 03/25/12 1026 Last data filed at 03/25/12 1000  Gross per 24 hour  Intake   4875 ml  Output    470 ml  Net   4405 ml     Diet Order: NPO  IVF:    sodium chloride Last Rate: Stopped (03/25/12 1000)  dextrose   dextrose 5 % and 0.45 % NaCl with KCl 20 mEq/L Last Rate: 100 mL/hr (03/25/12 0940)  DISCONTD: sodium chloride   DISCONTD: lactated ringers     Estimated Nutritional Needs:   Kcal: 1500 Protein: 80-95 grams Fluid: 1.5-1.6 liters  Patient with  no nutrition problems PTA.  Weight is WNL and has been stable with good intake per patient's daughter.  Patient remains intubated today, S/P surgery 5/23.  Hopeful to extubate today per RN.  NUTRITION DIAGNOSIS: -Inadequate oral intake (NI-2.1).  Status: Ongoing  RELATED TO: inability to eat  AS EVIDENCED BY: NPO status  MONITORING/EVALUATION(Goals): Goal:  Meet 90-100% of estimated nutrition needs. Monitor:  Diet advancement after extubation, labs, weight trend.  EDUCATION NEEDS: -Education not appropriate at this time  INTERVENTION: If unable to extubate patient today, consider initiating elemental TF via enteral feeding tube (NG, OG, or panda) at a low rate to maintain gut function--Vital AF 1.2 at 10 ml/h.  Dietitian #:  [phone]  DOCUMENTATION CODES Per approved criteria  -Not Applicable    Evi, Mccomb 03/25/2012, 10:17 AM

## 2012-03-25 NOTE — H&P (Signed)
Name: Megan [name] MRN: [id number] DOB: [birth year]    LOS: 1  Referring Provider:  Oletta Lamas Reason for Referral:  Free air in abdomen  PULMONARY / CRITICAL CARE MEDICINE  HPI:  78 y/o female with DM and HTN presented to the [facility] ED on 5/23 with abdominal pain for three days and was found to have free air on a KUB.  She stated that she had been constipated for several days prior to admission.  Her last bowel movement was on 5/21 which was associated with a lot of straining and one episode of nausea and vomiting.  She was noted to be confused by her family on the day of admission and so they brought her in to the ED.    Past Medical History  Diagnosis Date  . Hypertension   . Diabetes mellitus   . Diastolic dysfunction, left ventricle by ECHO 2011 03/25/2012  . Hypothyroidism 03/25/2012   History reviewed. No pertinent past surgical history. Prior to Admission medications   Medication Sig Start Date End Date Taking? Authorizing Provider  amoxicillin (AMOXIL) 500 MG capsule Take 500 mg by mouth 3 (three) times daily. Started 03/16/12 ending 03/24/12 for 8 days   Yes Historical Provider, MD  Cholecalciferol (VITAMIN D PO) Take 1 tablet by mouth daily.   Yes Historical Provider, MD  clorazepate (TRANXENE) 7.5 MG tablet Take 7.5 mg by mouth at bedtime.   Yes Historical Provider, MD  glimepiride (AMARYL) 4 MG tablet Take 4 mg by mouth daily before breakfast.   Yes Historical Provider, MD  hyoscyamine (LEVSIN, ANASPAZ) 0.125 MG tablet Take 0.125 mg by mouth every 4 (four) hours as needed. For cramping/bloating/diarrhea   Yes Historical Provider, MD  Ibuprofen (ADVIL PO) Take 1-2 tablets by mouth every 6 (six) hours as needed. For pain. Pt can take 1 or 2 tablets for pain   Yes Historical Provider, MD  levothyroxine (SYNTHROID, LEVOTHROID) 50 MCG tablet Take 50 mcg by mouth daily.   Yes Historical Provider, MD  meloxicam (MOBIC) 15 MG tablet Take 15 mg by mouth daily.   Yes Historical Provider, MD    Allergies No Known Allergies  Family History History reviewed. No pertinent family history. Social History  reports that she has never smoked. Her smokeless tobacco use includes Snuff. She reports that she does not drink alcohol or use illicit drugs.  Review Of Systems:   Gen: Denies fever, chills, weight change, fatigue, night sweats HEENT: Denies blurred vision, double vision, hearing loss, tinnitus, sinus congestion, rhinorrhea, sore throat, neck stiffness, dysphagia PULM: Denies shortness of breath, cough, sputum production, hemoptysis, wheezing CV: Denies chest pain, edema, orthopnea, paroxysmal nocturnal dyspnea, palpitations GI: Per HPI GU: Denies dysuria, hematuria, polyuria, oliguria, urethral discharge Endocrine: Denies hot or cold intolerance, polyuria, polyphagia or appetite change Derm: Denies rash, dry skin, scaling or peeling skin change Heme: Denies easy bruising, bleeding, bleeding gums Neuro: Denies headache, numbness, weakness, slurred speech, loss of memory or consciousness but notes confusion   Brief patient description:  78 y/o female with DM, HTN admitted on 5/23 with free peritoneal air likely related to recent constipation, possible sbo.  She has acute renal failure and a lactic acidosis both likely related to peritonitis.  Events Since Admission: 5/24 CT Abdomen >>  Current Status:  Vital Signs: Temp:  [97.4 F (36.3 C)-100.2 F (37.9 C)] 97.4 F (36.3 C) (05/24 0800) Pulse Rate:  [58-107] 87  (05/24 1214) Resp:  [15-33] 19  (05/24 1214) BP: (89-154)/(41-69) 113/44 mmHg (05/24  1214) SpO2:  [83 %-100 %] 100 % (05/24 1214) Arterial Line BP: (104-142)/(35-55) 114/46 mmHg (05/24 1100) FiO2 (%):  [40 %-100 %] 40 % (05/24 1214) Weight:  [149 lb 14.6 oz (68 kg)] 149 lb 14.6 oz (68 kg) (05/24 0335)  Physical Examination: Gen: chronically ill appearing but in no acute distress HEENT: NCAT, PERRL, EOMi, OP clear, mucus membranes profoundly dry PULM: CTA  B CV: RRR, systolic murmur RUSB noted, no JVD AB: BS+, soft, nontender, no hsm Ext: warm, trace edema, no clubbing, no cyanosis Derm: no rash or skin breakdown Neuro: Awake and alert, oriented to hospital and situation, moves all four extremities well   Principal Problem:  *Peritonitis Active Problems:  Intra-abdominal free air of unknown etiology  AKI (acute kidney injury)  Lactic acidosis  Severe sepsis  Delirium  Diabetes mellitus  Hypertension  Diastolic dysfunction, left ventricle by ECHO 2011  Arthritis  Hypothyroidism  Acute respiratory failure   ASSESSMENT AND PLAN  PULMONARY  Lab 03/25/12 0446 03/25/12 0430 03/25/12 0227 03/25/12 0053  PHART -- 7.335* 7.287* --  PCO2ART -- 31.9* 42.2 --  PO2ART -- 278.0* 457.0* --  HCO3 -- 16.6* 20.1 21.1  O2SAT 62.9 99.8 100.0 38.0   Ventilator Settings: n/a Vent Mode:  [-] PRVC FiO2 (%):  [40 %-100 %] 40 % Set Rate:  [15 bmp] 15 bmp Vt Set:  [500 mL] 500 mL PEEP:  [5 cmH20] 5 cmH20 Pressure Support:  [5 cmH20] 5 cmH20 Plateau Pressure:  [17 cmH20-19 cmH20] 19 cmH20 CXR:  Normal ETT:  n/a  A:  No acute respiratory issues P:   -monitor respiratory status -will revisit this evening if comes out of OR mechanically ventilated  CARDIOVASCULAR  Lab 03/25/12 0430 03/24/12 2242 03/24/12 2207  TROPONINI <0.30 -- <0.30  LATICACIDVEN 4.0* 5.4* --  PROBNP -- -- --   ECG:  NSR, no st wave changes Lines: peripheral IV Echo: 2011: Mild PH (RVSP 40), mild aortic insuf, mild mitral insuf  A: Hypertension at baseline, currently normotensive after receiving 1.8 L IV NS in ED P:  -continue IVF at 100cc/hour until surgery -hold bp meds -will likely need CVL, will place if time pre-surgery  A: Grade 1 diastolic dysfunction, currently compensated P: -Monitor respiratory status closely with volume resuscitation  RENAL  Lab 03/25/12 0430 03/25/12 0227 03/24/12 2200  NA 140 140 139  K 4.1 4.2 --  CL 107 -- 107  CO2 17* --  15*  BUN 37* -- 37*  CREATININE 2.25* -- 2.53*  CALCIUM 8.4 -- 9.3  MG -- -- --  PHOS -- -- --   Intake/Output      05/23 0701 - 05/24 0700 05/24 0701 - 05/25 0700   I.V. (mL/kg) 4250 (62.5) 425 (6.3)   Other  100   IV Piggyback 200 550   Total Intake(mL/kg) 4450 (65.4) 1075 (15.8)   Urine (mL/kg/hr) 215 (0.1) 225 (0.6)   Blood 30    Total Output 245 225   Net +4205 +850         Foley:  5/23 >>  A:  AKI, due to severe sepsis and poor po intake P:   -IVF resuscitation -hold NSAIDs -monitor UOP -repeat BMET in AM -renal dosing of meds  GASTROINTESTINAL  Lab 03/24/12 2200  AST 24  ALT 7  ALKPHOS 39  BILITOT 1.0  PROT 5.5*  ALBUMIN 2.3*    A:  Peritonitis P:   -see antibiotics below -surgery seeing now, to OR tonight  HEMATOLOGIC  Lab 03/25/12 0430 03/25/12 0227 03/24/12 2200  HGB 12.2 11.9* 12.7  HCT 37.1 35.0* 39.1  PLT 173 -- 158  INR -- -- 1.45  APTT -- -- 30   A:  No acute issues P:  -monitor cbc  INFECTIOUS  Lab 03/25/12 0430 03/24/12 2241 03/24/12 2200  WBC 7.6 -- 5.0  PROCALCITON -- >175.00 --   Cultures: 5/23 blood x2 >> 5/23 urine >>  Antibiotics: 5/23 Zosyn (peritonitis) >> 5/24 Diflucan (peritonitis) >>  A:  Peritonitis causing severe sepsis; not in septic shock; history of diverticulosis, worrisome for colonic perforation P:   -Agree with IVF given in ED -considering volume depletion, will continue IVF for now -Cont Zosyn, add diflucan as possible colonic perforation  ENDOCRINE  Lab 03/25/12 0426  GLUCAP 118*   A:  DM 2   P:   -ICU hyperglycemia protocol  A: Hypothyroidism P: -IV synthroid -convert to pill when able to take po  NEUROLOGIC  A:  Delirium, due to severe sepsis; improving with IVF resuscitation in ED P:   -frequent orientation -minimize sedating meds -lights off at night, on during day  BEST PRACTICE / DISPOSITION - Level of Care:  ICU - Primary Service:  PCCM - Consultants:  Gen Surgery -  Code Status:  Full - Diet:  npo - DVT Px:  scd - GI Px:  N/a (will add if mechanically ventilated after case) - Skin Integrity:  normal - Social / Family:  Updated at bedside

## 2012-03-25 NOTE — Progress Notes (Signed)
Patient ID: Megan [name], female   DOB: 1927-06-28, 78 y.o.   MRN: [id number] [facility] Progress Note:   1 Day Post-Op  Subjective: Mental status is sedated on ventilator Objective: Vital signs in last 24 hours: Temp:  [97.8 F (36.6 C)-100.2 F (37.9 C)] 97.8 F (36.6 C) (05/24 0428) Pulse Rate:  [58-107] 85  (05/24 0335) Resp:  [15-33] 15  (05/24 0800) BP: (95-154)/(50-69) 95/53 mmHg (05/24 0800) SpO2:  [83 %-100 %] 100 % (05/24 0100) Arterial Line BP: (104-142)/(35-54) 105/43 mmHg (05/24 0800) FiO2 (%):  [50 %-100 %] 50 % (05/24 0800) Weight:  [149 lb 14.6 oz (68 kg)] 149 lb 14.6 oz (68 kg) (05/24 0335)  Intake/Output from previous day: 05/23 0701 - 05/24 0700 In: 4450 [I.V.:4250; IV Piggyback:200] Out: 245 [Urine:215; Blood:30] Intake/Output this shift: Total I/O In: 125 [I.V.:125] Out: -   Physical Exam: Work of breathing is  controlled  Lab Results:  Results for orders placed during the hospital encounter of 03/24/12 (from the past 48 hour(s))  URINALYSIS, ROUTINE W REFLEX MICROSCOPIC     Status: Abnormal   Collection Time   03/24/12  9:55 PM      Component Value Range Comment   Color, Urine AMBER (*) YELLOW  BIOCHEMICALS MAY BE AFFECTED BY COLOR   APPearance CLOUDY (*) CLEAR     Specific Gravity, Urine 1.024  1.005 - 1.030     pH 5.0  5.0 - 8.0     Glucose, UA 100 (*) NEGATIVE (mg/dL)    Hgb urine dipstick NEGATIVE  NEGATIVE     Bilirubin Urine SMALL (*) NEGATIVE     Ketones, ur 15 (*) NEGATIVE (mg/dL)    Protein, ur 30 (*) NEGATIVE (mg/dL)    Urobilinogen, UA 0.2  0.0 - 1.0 (mg/dL)    Nitrite NEGATIVE  NEGATIVE     Leukocytes, UA TRACE (*) NEGATIVE    URINE MICROSCOPIC-ADD ON     Status: Abnormal   Collection Time   03/24/12  9:55 PM      Component Value Range Comment   Squamous Epithelial / LPF RARE  RARE     WBC, UA 0-2  <3 (WBC/hpf)    Bacteria, UA RARE  RARE     Casts HYALINE CASTS (*) NEGATIVE     Crystals CA OXALATE CRYSTALS (*)  NEGATIVE     Urine-Other AMORPHOUS URATES/PHOSPHATES   MUCOUS PRESENT  CBC     Status: Normal   Collection Time   03/24/12 10:00 PM      Component Value Range Comment   WBC 5.0  4.0 - 10.5 (K/uL)    RBC 4.42  3.87 - 5.11 (MIL/uL)    Hemoglobin 12.7  12.0 - 15.0 (g/dL)    HCT 40.9  81.1 - 91.4 (%)    MCV 88.5  78.0 - 100.0 (fL)    MCH 28.7  26.0 - 34.0 (pg)    MCHC 32.5  30.0 - 36.0 (g/dL)    RDW 78.2  95.6 - 21.3 (%)    Platelets 158  150 - 400 (K/uL)   DIFFERENTIAL     Status: Normal   Collection Time   03/24/12 10:00 PM      Component Value Range Comment   Neutrophils Relative 75  43 - 77 (%)    Lymphocytes Relative 18  12 - 46 (%)    Monocytes Relative 5  3 - 12 (%)    Eosinophils Relative 2  0 - 5 (%)  Basophils Relative 0  0 - 1 (%)    Neutro Abs 3.7  1.7 - 7.7 (K/uL)    Lymphs Abs 0.9  0.7 - 4.0 (K/uL)    Monocytes Absolute 0.3  0.1 - 1.0 (K/uL)    Eosinophils Absolute 0.1  0.0 - 0.7 (K/uL)    Basophils Absolute 0.0  0.0 - 0.1 (K/uL)    WBC Morphology DOHLE BODIES     COMPREHENSIVE METABOLIC PANEL     Status: Abnormal   Collection Time   03/24/12 10:00 PM      Component Value Range Comment   Sodium 139  135 - 145 (mEq/L)    Potassium 4.5  3.5 - 5.1 (mEq/L)    Chloride 107  96 - 112 (mEq/L)    CO2 15 (*) 19 - 32 (mEq/L)    Glucose, Bld 87  70 - 99 (mg/dL)    BUN 37 (*) 6 - 23 (mg/dL)    Creatinine, Ser 1.61 (*) 0.50 - 1.10 (mg/dL)    Calcium 9.3  8.4 - 10.5 (mg/dL)    Total Protein 5.5 (*) 6.0 - 8.3 (g/dL)    Albumin 2.3 (*) 3.5 - 5.2 (g/dL)    AST 24  0 - 37 (U/L)    ALT 7  0 - 35 (U/L)    Alkaline Phosphatase 39  39 - 117 (U/L)    Total Bilirubin 1.0  0.3 - 1.2 (mg/dL)    GFR calc non Af Amer 17 (*) >90 (mL/min)    GFR calc Af Amer 19 (*) >90 (mL/min)   APTT     Status: Normal   Collection Time   03/24/12 10:00 PM      Component Value Range Comment   aPTT 30  24 - 37 (seconds)   PROTIME-INR     Status: Abnormal   Collection Time   03/24/12 10:00 PM       Component Value Range Comment   Prothrombin Time 17.9 (*) 11.6 - 15.2 (seconds)    INR 1.45  0.00 - 1.49    TROPONIN I     Status: Normal   Collection Time   03/24/12 10:07 PM      Component Value Range Comment   Troponin I <0.30  <0.30 (ng/mL)   PROCALCITONIN     Status: Normal   Collection Time   03/24/12 10:41 PM      Component Value Range Comment   Procalcitonin >175.00     LACTIC ACID, PLASMA     Status: Abnormal   Collection Time   03/24/12 10:42 PM      Component Value Range Comment   Lactic Acid, Venous 5.4 (*) 0.5 - 2.2 (mmol/L)   POCT I-STAT 3, BLOOD GAS (G3P V)     Status: Abnormal   Collection Time   03/25/12 12:53 AM      Component Value Range Comment   pH, Ven 7.322 (*) 7.250 - 7.300     pCO2, Ven 40.8 (*) 45.0 - 50.0 (mmHg)    pO2, Ven 24.0 (*) 30.0 - 45.0 (mmHg)    Bicarbonate 21.1  20.0 - 24.0 (mEq/L)    TCO2 22  0 - 100 (mmol/L)    O2 Saturation 38.0      Acid-base deficit 5.0 (*) 0.0 - 2.0 (mmol/L)    Sample type VENOUS     MRSA PCR SCREENING     Status: Normal   Collection Time   03/25/12  4:19 AM      Component  Value Range Comment   MRSA by PCR NEGATIVE  NEGATIVE    GLUCOSE, CAPILLARY     Status: Abnormal   Collection Time   03/25/12  4:26 AM      Component Value Range Comment   Glucose-Capillary 118 (*) 70 - 99 (mg/dL)    Comment 1 Documented in Chart      Comment 2 Notify RN     BLOOD GAS, ARTERIAL     Status: Abnormal   Collection Time   03/25/12  4:30 AM      Component Value Range Comment   FIO2 1.00      Delivery systems VENTILATOR      Mode PRESSURE REGULATED VOLUME CONTROL      VT 500      Rate 15      Peep/cpap 5.0      pH, Arterial 7.335 (*) 7.350 - 7.400     pCO2 arterial 31.9 (*) 35.0 - 45.0 (mmHg)    pO2, Arterial 278.0 (*) 80.0 - 100.0 (mmHg)    Bicarbonate 16.6 (*) 20.0 - 24.0 (mEq/L)    TCO2 17.5  0 - 100 (mmol/L)    Acid-base deficit 8.1 (*) 0.0 - 2.0 (mmol/L)    O2 Saturation 99.8      Patient temperature 98.6      Collection  site A-LINE      Drawn by [phone]      Sample type ARTERIAL DRAW      Allens test (pass/fail) PASS  PASS    LACTIC ACID, PLASMA     Status: Abnormal   Collection Time   03/25/12  4:30 AM      Component Value Range Comment   Lactic Acid, Venous 4.0 (*) 0.5 - 2.2 (mmol/L)   CARDIAC PANEL(CRET KIN+CKTOT+MB+TROPI)     Status: Abnormal   Collection Time   03/25/12  4:30 AM      Component Value Range Comment   Total CK 473 (*) 7 - 177 (U/L)    CK, MB 8.0 (*) 0.3 - 4.0 (ng/mL)    Troponin I <0.30  <0.30 (ng/mL)    Relative Index 1.7  0.0 - 2.5    CBC     Status: Normal   Collection Time   03/25/12  4:30 AM      Component Value Range Comment   WBC 7.6  4.0 - 10.5 (K/uL)    RBC 4.24  3.87 - 5.11 (MIL/uL)    Hemoglobin 12.2  12.0 - 15.0 (g/dL)    HCT 40.9  81.1 - 91.4 (%)    MCV 87.5  78.0 - 100.0 (fL)    MCH 28.8  26.0 - 34.0 (pg)    MCHC 32.9  30.0 - 36.0 (g/dL)    RDW 78.2  95.6 - 21.3 (%)    Platelets 173  150 - 400 (K/uL)   BASIC METABOLIC PANEL     Status: Abnormal   Collection Time   03/25/12  4:30 AM      Component Value Range Comment   Sodium 140  135 - 145 (mEq/L)    Potassium 4.1  3.5 - 5.1 (mEq/L)    Chloride 107  96 - 112 (mEq/L)    CO2 17 (*) 19 - 32 (mEq/L)    Glucose, Bld 109 (*) 70 - 99 (mg/dL)    BUN 37 (*) 6 - 23 (mg/dL)    Creatinine, Ser 0.86 (*) 0.50 - 1.10 (mg/dL)    Calcium 8.4  8.4 - 10.5 (mg/dL)  GFR calc non Af Amer 19 (*) >90 (mL/min)    GFR calc Af Amer 22 (*) >90 (mL/min)   CARBOXYHEMOGLOBIN     Status: Normal   Collection Time   03/25/12  4:46 AM      Component Value Range Comment   Total hemoglobin 12.5  12.5 - 16.0 (g/dL)    O2 Saturation 09.8      Carboxyhemoglobin 0.9  0.5 - 1.5 (%)    Methemoglobin 0.8  0.0 - 1.5 (%)     Radiology/Results: Ct Abdomen Pelvis Wo Contrast  03/25/2012  *RADIOLOGY REPORT*  Clinical Data: Abdominal pain.  Sepsis.  Pneumoperitoneum on x-ray earlier.  Acute renal failure.  CT ABDOMEN AND PELVIS WITHOUT CONTRAST  03/25/2012:  Technique:  Multidetector CT imaging of the abdomen and pelvis was performed following the standard protocol without intravenous contrast.  Comparison: CT abdomen and pelvis 12/30/2008.  Findings: Moderate pneumoperitoneum and moderate ascites throughout the abdomen and pelvis.  Etiology of the gas-filled to most likely be in the stomach, as there may be intramural gas in the wall of the proximal body of the stomach.  Small bowel normal in appearance.  Large stool burden throughout decompressed colon. Diffuse colonic diverticulosis, though no extraluminal gas adjacent to the colon to suggest this as the etiology.  Focal circumferential wall thickening involving the mid transverse colon in an area of collapsed bowel.  Normal unenhanced appearance of the liver, spleen, and adrenal glands.  Numerous cysts in both kidneys, some of which have calcifications in their walls, with the most calcification involving the wall of a left lower pole cyst.  Within the limits of the unenhanced technique, no definite solid renal masses.  No urinary tract calculi.  No hydronephrosis.  Gallbladder unremarkable by CT.  No biliary ductal dilation. Extensive aorto-iliofemoral atherosclerosis without aneurysm.  No significant lymphadenopathy.  Uterus atrophic consistent with age. No visible adnexal masses.  Phleboliths low in the pelvis.  Urinary bladder decompressed by Foley catheter.  Small right pleural effusion and associated mild passive atelectasis in the right lower lobe.  Heart enlarged.  Bone window images demonstrate severe degenerative changes involving the lumbar spine.  IMPRESSION:  1.  Moderate pneumoperitoneum and moderate ascites.  Etiology felt to most likely be in the stomach (gastric ulcer?) 2.  Diffuse colonic diverticulosis.  Focal circumferential wall thickening involving the mid transverse colon may just be due to the fact that this is the collapse segment.  A colonic mass is not entirely excluded,  however. 3.  Large bilateral renal cysts, some of which have calcification in their walls because of the thickness of the calcification of the cyst in the lower pole of the left kidney, this would be [facility] centered a Bosniak category II F lesion.  The remaining cysts are Bosniak category II. 4.  Peritoneal carcinomatosis is suspected. 5.  Small right pleural effusion and associated passive atelectasis or pneumonia in the right lower lobe.  Critical results were discussed directly with Dr. Michaell Cowing of general surgery at the time of interpretation on 03/25/2012 at 0040 hours.  Original Report Authenticated By: Arnell Sieving, M.D.   Ct Head Wo Contrast  03/24/2012  *RADIOLOGY REPORT*  Clinical Data: Acute mental status changes with lethargy.  CT HEAD WITHOUT CONTRAST  Technique:  Contiguous axial images were obtained from the base of the skull through the vertex without contrast.  Comparison: MRI brain 01/15/2010, 06/06/2008.  Unenhanced cranial CT 01/13/2010.  Findings: Moderate cortical and deep atrophy, mild cerebellar atrophy,  and severe changes of small vessel disease of the white matter diffusely, unchanged.  Physiologic calcifications in the right basal ganglia, unchanged. No mass lesion.  No midline shift. No acute hemorrhage or hematoma.  No extra-axial fluid collections. No evidence of acute infarction.  No significant interval change.  Severe changes of hyperostosis frontalis interna bilaterally. Visualized paranasal sinuses, mastoid air cells, and middle ear cavities well-aerated.  Severe bilateral carotid siphon and vertebral artery atherosclerosis.  IMPRESSION:  1.  No acute intracranial abnormality. 2.  Stable moderate generalized atrophy and severe chronic microvascular ischemic changes of the white matter diffusely.  Original Report Authenticated By: Arnell Sieving, M.D.   Portable Chest Xray  03/25/2012  *RADIOLOGY REPORT*  Clinical Data:  Evaluate tube position.  PORTABLE CHEST - 1 VIEW   Comparison:  03/25/2012  Findings: Endotracheal tube tip is located 4.5 cm proximal to the carina.  Right IJ catheter tip projects over the mid SVC.  Stable cardiomediastinal contours.  Aortic arch atherosclerosis. Bibasilar opacities.  No pneumothorax.  Cannot exclude small pleural effusions.  Osteopenia and multilevel degenerative changes. NG tube side port is at the GE junction and therefore should be advanced.  IMPRESSION: Endotracheal tube tip appropriately positioned.  NG tube side port is at the GE junction and therefore should be advanced.  Bibasilar opacities.  Original Report Authenticated By: Waneta Martins, M.D.   Dg Chest Portable 1 View  03/25/2012  *RADIOLOGY REPORT*  Clinical Data: Central venous catheter placement.  PORTABLE CHEST - 1 VIEW 03/25/2012 0114 hours:  Comparison: Portable chest x-ray yesterday.  Two-view chest x-ray 01/13/2010.  Findings: Right jugular central venous catheter tip in the SVC.  No evidence of pneumothorax or mediastinal hematoma.  Suboptimal inspiration with atelectasis in the lung bases.  Cardiac silhouette mildly enlarged but stable.  Pulmonary vascularity normal without evidence pulmonary edema.  IMPRESSION:  1.  Right jugular central venous catheter tip in the SVC.  No acute complicating features. 2.  Suboptimal inspiration accounts for bibasilar atelectasis.  No acute cardiopulmonary disease otherwise.  Stable cardiomegaly without pulmonary edema.  Original Report Authenticated By: Arnell Sieving, M.D.   Dg Chest Portable 1 View  03/24/2012  *RADIOLOGY REPORT*  Clinical Data: Altered mental status, hypertension  PORTABLE CHEST - 1 VIEW  Comparison: 01/13/2010  Findings: Cardiomegaly.  Central vascular congestion. Aortic arch atherosclerosis.  No overt edema or pneumothorax.  Hemidiaphragm elevation obscures the lung bases.  Small effusions not excluded. Mild lung base opacities.  Osteopenia.  Shoulder degenerative changes and multilevel degenerative  changes of the thoracic vertebrae.  IMPRESSION: Prominent cardiomediastinal contours, similar to prior.  Mild lung base opacities; atelectasis versus infiltrate.  Original Report Authenticated By: Waneta Martins, M.D.   Dg Abd 2 Views  03/24/2012  *RADIOLOGY REPORT*  Clinical Data: Abdominal pain and tenderness.  ABDOMEN - 2 VIEW  Comparison: CT abdomen pelvis 12/30/2008.  Findings: Bowel gas pattern unremarkable without evidence of obstruction or significant ileus.  Free intraperitoneal air identified adjacent to the liver on the lateral decubitus view. Aorto-iliac atherosclerosis.  No visible opaque urinary tract calculi.  Osteopenia, severe degenerative changes involving the lumbar spine, and severe degenerative changes involving the right sacroiliac joint and both hips.  IMPRESSION: Pneumoperitoneum.  Critical Value/emergent results were called by telephone at the time of interpretation on 03/24/2012  at 2315 hours  to  Dr. Oletta Lamas of the emergency department, who verbally acknowledged these results.  Original Report Authenticated By: Arnell Sieving, M.D.  Anti-infectives: Anti-infectives     Start     Dose/Rate Route Frequency Ordered Stop   03/26/12 0000   fluconazole (DIFLUCAN) IVPB 200 mg        200 mg 100 mL/hr over 60 Minutes Intravenous Every 24 hours 03/25/12 0406     03/25/12 0600  piperacillin-tazobactam (ZOSYN) IVPB 2.25 g       2.25 g 100 mL/hr over 30 Minutes Intravenous 4 times per day 03/25/12 0406     03/25/12 0415   piperacillin-tazobactam (ZOSYN) IVPB 3.375 g  Status:  Discontinued        3.375 g 100 mL/hr over 30 Minutes Intravenous  Once 03/25/12 0406 03/25/12 0452   03/25/12 0145   gentamycin 80 mg in 0.9% normal saline 250 mL irrigation  Status:  Discontinued        80 mg Irrigation Every 5 min 03/25/12 0122 03/25/12 0406   03/25/12 0145   clindamycin (CLEOCIN) IVPB 900 mg        900 mg 100 mL/hr over 30 Minutes Intravenous  Once 03/25/12 0142 03/25/12 0652    03/25/12 0130   clindamycin (CLEOCIN) injection 900 mg  Status:  Discontinued        900 mg Intravenous  Once 03/25/12 0126 03/25/12 0141   03/25/12 0100   fluconazole (DIFLUCAN) IVPB 200 mg        200 mg 100 mL/hr over 60 Minutes Intravenous  Once 03/25/12 0051 03/25/12 0735   03/24/12 2345  piperacillin-tazobactam (ZOSYN) IVPB 3.375 g       3.375 g 100 mL/hr over 30 Minutes Intravenous  Once 03/24/12 2341 03/25/12 0015          Assessment/Plan: Problem List: Patient Active Problem List  Diagnoses  . Peritonitis  . Intra-abdominal free air of unknown etiology  . AKI (acute kidney injury)  . Lactic acidosis  . Severe sepsis  . Delirium  . Diabetes mellitus  . Hypertension  . Diastolic dysfunction, left ventricle by ECHO 2011  . Arthritis  . Hypothyroidism    1 day post chronic duodenal perforation treated with lap Cheree Ditto patch.   1 Day Post-Op    LOS: 1 day   Matt B. Daphine Deutscher, MD, [facility] [phone] beeper [phone]  03/25/2012 8:52 AM

## 2012-03-25 NOTE — Consult Note (Signed)
Name: Megan [name] MRN: [id number] DOB: Dec 27, 1926    LOS: 1  Referring Provider:  Oletta Lamas Reason for Referral:  Free air in abdomen  PULMONARY / CRITICAL CARE MEDICINE  Brief patient description:  78 y/o female with DM, HTN admitted on 5/23 with free peritoneal air likely related to recent constipation, possible sbo.  She has acute renal failure and a lactic acidosis both likely related to peritonitis.  Events Since Admission: 5/24 CT Abdomen >> Pneumoperitoneum with ascites, diffuse colonic diverticulosis, Peritoneal carcinomatosis is suspected 5/23 CT head >>> no acute findings  5/24 OR for duodenal perforation  Current Status: Intubated   Vital Signs: Temp:  [97.4 F (36.3 C)-100.2 F (37.9 C)] 97.4 F (36.3 C) (05/24 0800) Pulse Rate:  [58-107] 87  (05/24 1100) Resp:  [15-33] 21  (05/24 1100) BP: (89-154)/(43-69) 89/55 mmHg (05/24 1100) SpO2:  [83 %-100 %] 93 % (05/24 1100) Arterial Line BP: (104-142)/(35-55) 114/46 mmHg (05/24 1100) FiO2 (%):  [40 %-100 %] 40 % (05/24 1100) Weight:  [68 kg (149 lb 14.6 oz)] 68 kg (149 lb 14.6 oz) (05/24 0335)  Physical Examination: Gen: chronically ill appearing but in no acute distress HEENT: ETT, dry MM, PERRL PULM: scattered rhonchi  CV: RRR, systolic murmur 2/6 AB: hypoactive BS, soft, nontender Ext: warm, trace edema, no clubbing, no cyanosis Derm: no rash or skin breakdown Neuro: lethargic, arouses to voice, follows commands   Principal Problem:  *Peritonitis Active Problems:  Intra-abdominal free air of unknown etiology  AKI (acute kidney injury)  Lactic acidosis  Severe sepsis  Delirium  Diabetes mellitus  Hypertension  Diastolic dysfunction, left ventricle by ECHO 2011  Arthritis  Hypothyroidism   ASSESSMENT AND PLAN  PULMONARY  Lab 03/25/12 0446 03/25/12 0430 03/25/12 0227 03/25/12 0053  PHART -- 7.335* 7.287* --  PCO2ART -- 31.9* 42.2 --  PO2ART -- 278.0* 457.0* --  HCO3 -- 16.6* 20.1 21.1  O2SAT  62.9 99.8 100.0 38.0   Ventilator Settings: n/a Vent Mode:  [-] PRVC FiO2 (%):  [40 %-100 %] 40 % Set Rate:  [15 bmp] 15 bmp Vt Set:  [500 mL] 500 mL PEEP:  [5 cmH20] 5 cmH20 Plateau Pressure:  [17 cmH20] 17 cmH20 CXR: ATX bilaterally   ETT:  5/24  A: Acute respiratory failure, s/p surgery  P:   -PS trial today  -f/u cxr -PRN fentanyl   CARDIOVASCULAR  Lab 03/25/12 0430 03/24/12 2242 03/24/12 2207  TROPONINI <0.30 -- <0.30  LATICACIDVEN 4.0* 5.4* --  PROBNP -- -- --   ECG:  NSR, no st wave changes Lines:  5/24 CVL >>>  Echo: 2011: Mild PH (RVSP 40), mild aortic insuf, mild mitral insuf  A: Hypertension at baseline, now with intermittent hypotension P:  -continue IVF  -hold bp meds -trend CVP   A: Grade 1 diastolic dysfunction, currently compensated P: -Monitor respiratory status closely with volume resuscitation  RENAL  Lab 03/25/12 0430 03/25/12 0227 03/24/12 2200  NA 140 140 139  K 4.1 4.2 --  CL 107 -- 107  CO2 17* -- 15*  BUN 37* -- 37*  CREATININE 2.25* -- 2.53*  CALCIUM 8.4 -- 9.3  MG -- -- --  PHOS -- -- --   Intake/Output      05/23 0701 - 05/24 0700 05/24 0701 - 05/25 0700   I.V. (mL/kg) 4250 (62.5) 425 (6.3)   Other  100   IV Piggyback 200 550   Total Intake(mL/kg) 4450 (65.4) 1075 (15.8)   Urine (mL/kg/hr)  215 (0.1) 225 (0.7)   Blood 30    Total Output 245 225   Net +4205 +850         Foley:  5/23 >>  A:  AKI, due to severe sepsis and poor po intake.  Metabolic acidosis P:   -hold home NSAIDs -monitor UOP -trend sCr.  -renal dosing of meds -add HCO3 to IV fluid and f/u BMET  GASTROINTESTINAL  Lab 03/24/12 2200  AST 24  ALT 7  ALKPHOS 39  BILITOT 1.0  PROT 5.5*  ALBUMIN 2.3*    A:  Peritonitis s/p duodenal perforation- OR 5/24 P:   -see antibiotics below -NPO for now  -per surgery   HEMATOLOGIC  Lab 03/25/12 0430 03/25/12 0227 03/24/12 2200  HGB 12.2 11.9* 12.7  HCT 37.1 35.0* 39.1  PLT 173 -- 158  INR -- --  1.45  APTT -- -- 30   A:  No acute issues P:  -monitor cbc  INFECTIOUS  Lab 03/25/12 0430 03/24/12 2241 03/24/12 2200  WBC 7.6 -- 5.0  PROCALCITON -- >175.00 --   Cultures: 5/23 blood x2 >> 5/23 urine >>  Antibiotics: 5/23 Zosyn (peritonitis) >> 5/24 Diflucan (peritonitis) >>  A:  Peritonitis causing severe sepsis; not in septic shock;  P:   - will continue IVF for now -Cont Zosyn and diflucan  -f/u lactic acid in AM   ENDOCRINE  Lab 03/25/12 0426  GLUCAP 118*   A:  DM 2   P:   -ICU hyperglycemia protocol  A: Hypothyroidism P: -IV synthroid -convert to pill when able to take po  NEUROLOGIC  A:  Acute metabolic encephalopathy, due to severe sepsis. P:   -minimize sedating meds -supportive care   BEST PRACTICE / DISPOSITION - Level of Care:  ICU - Primary Service:  PCCM - Consultants:  Gen Surgery - Code Status:  Full - Diet:  npo - DVT Px:  scd - GI Px:  N/a (will add if mechanically ventilated after case) - Skin Integrity:  normal - Social / Family:  Updated at bedside  Reviewed above, examined pt, and agree with assessment/plan.  Critical care time 35 minutes.  Coralyn Helling, MD 03/25/2012, 12:09 PM Pager:  [phone]

## 2012-03-25 NOTE — Anesthesia Preprocedure Evaluation (Addendum)
Anesthesia Evaluation  Patient identified by MRN, date of birth, ID band Patient awake    Reviewed: Allergy & Precautions, H&P , NPO status , Patient's Chart, lab work & pertinent test results  Airway Mallampati: III    Mouth opening: Limited Mouth Opening  Dental No notable dental hx. (+) Teeth Intact and Dental Advisory Given,  Difficult to assess; Pt. lethargic:   Pulmonary neg pulmonary ROS,  breath sounds clear to auscultation  Pulmonary exam normal       Cardiovascular hypertension, On Medications Rhythm:Regular Rate:Normal     Neuro/Psych PSYCHIATRIC DISORDERS negative neurological ROS     GI/Hepatic negative GI ROS, Neg liver ROS,   Endo/Other  Diabetes mellitus-, Type 2, Oral Hypoglycemic Agents  Renal/GU negative Renal ROS  negative genitourinary   Musculoskeletal   Abdominal   Peds  Hematology negative hematology ROS (+)   Anesthesia Other Findings   Reproductive/Obstetrics negative OB ROS                          Anesthesia Physical Anesthesia Plan  ASA: III and Emergent  Anesthesia Plan: General   Post-op Pain Management:    Induction: Intravenous, Rapid sequence and Cricoid pressure planned  Airway Management Planned: Oral ETT  Additional Equipment:   Intra-op Plan:   Post-operative Plan: Extubation in OR and Possible Post-op intubation/ventilation  Informed Consent: I have reviewed the patients History and Physical, chart, labs and discussed the procedure including the risks, benefits and alternatives for the proposed anesthesia with the patient or authorized representative who has indicated his/her understanding and acceptance.   Dental advisory given  Plan Discussed with: CRNA  Anesthesia Plan Comments:         Anesthesia Quick Evaluation

## 2012-03-25 NOTE — Procedures (Signed)
Central Venous Catheter Insertion Procedure Note Megan [name] [id number] 04/16/1927  Procedure: Insertion of Central Venous Catheter Indications: Assessment of intravascular volume, Drug and/or fluid administration and Frequent blood sampling  Procedure Details Consent: Risks of procedure as well as the alternatives and risks of each were explained to the (patient/caregiver).  Consent for procedure obtained. Time Out: Verified patient identification, verified procedure, site/side was marked, verified correct patient position, special equipment/implants available, medications/allergies/relevent history reviewed, required imaging and test results available.  Performed  Maximum sterile technique was used including antiseptics, cap, gloves, gown, hand hygiene, mask and sheet. Skin prep: Chlorhexidine; local anesthetic administered A antimicrobial bonded/coated triple lumen catheter was placed in the right internal jugular vein using the Seldinger technique. Ultrasound used for vessel identification.  Evaluation Blood flow good Complications: No apparent complications Patient did tolerate procedure well. Chest X-ray ordered to verify placement.  CXR: pending.  Megan [name] 03/25/2012, 1:12 AM

## 2012-03-25 NOTE — Consult Note (Signed)
Megan [name]  [birth year] [id number]  CARE TEAM:  PCP: Pola Corn, MD, MD  Outpatient Care Team: Patient Care Team: Pola Corn, MD as PCP - General (Cardiology)  Inpatient Treatment Team: Treatment Team: Attending Provider: Gavin Pound. Oletta Lamas, MD; Registered Nurse: Merian Capron, RN; Technician: Ronita Hipps, [place]; Technician: Drema Halon, EMT; Consulting Physician: Md Pccm, MD   This patient is a 78 y.o.female who presents today for surgical evaluation at the request of Dr. Selena Batten.   Reason for evaluation: Pneumoperitoneum on x-ray. Evidence of shock. Suspicious of perforation.  Patient is a pleasant elderly woman. She lives at home. She takes care of her husband who is an amputee. Her daughter and granddaughter live in area and check up on him from time to time. She was having some abdominal pain and mental status changes. It worsened markedly today. She was brought in the emergency room. The workup did not show any definite stroke. Abdominal exam and x-rays concerning for an abdominal problem. X-ray show very suspicious for free air. Critical care and surgery have been consulted.  The daughter recalls the patient getting a colonoscopy a few years ago. There was a CAT scan done a few years ago suspicious for an intracolonic mass. Colonoscopy was done by Dr. Evette Cristal in 2010 which was clean. A followup CAT scan since that time has not shown any persistent mass. No family history of colon cancer. No prior attacks of diverticulitis according to the daughter. The patient denies any history of reflux or gastritis. Does occasionally use nonsteroidals for pain history of arthritis.  Some history of moderate constipation as well. Last bowel movement movement of 4 days ago.  Patient Active Problem List  Diagnoses  . Peritonitis  . Intra-abdominal free air of unknown etiology  . AKI (acute kidney injury)  . Lactic acidosis  . Severe sepsis  . Delirium  . Diabetes mellitus   . Hypertension    Past Medical History  Diagnosis Date  . Hypertension   . Diabetes mellitus     History reviewed. No pertinent past surgical history.  History   Social History  . Marital Status: Married    Spouse Name: N/A    Number of Children: N/A  . Years of Education: N/A   Occupational History  . Not on file.   Social History Main Topics  . Smoking status: Never Smoker   . Smokeless tobacco: Current User    Types: Snuff  . Alcohol Use: No  . Drug Use: No  . Sexually Active: Not on file   Other Topics Concern  . Not on file   Social History Narrative  . No narrative on file    History reviewed. No pertinent family history.  Current Facility-Administered Medications  Medication Dose Route Frequency Provider Last Rate Last Dose  . piperacillin-tazobactam (ZOSYN) IVPB 3.375 g  3.375 g Intravenous Once Colleen Can, PHARMD   3.375 g at 03/24/12 2345  . sodium chloride 0.9 % bolus 1,000 mL  1,000 mL Intravenous Once Gavin Pound. Ghim, MD   1,000 mL at 03/24/12 2145   Current Outpatient Prescriptions  Medication Sig Dispense Refill  . amoxicillin (AMOXIL) 500 MG capsule Take 500 mg by mouth 3 (three) times daily. Started 03/16/12 ending 03/24/12 for 8 days      . Cholecalciferol (VITAMIN D PO) Take 1 tablet by mouth daily.      . clorazepate (TRANXENE) 7.5 MG tablet Take 7.5 mg by mouth at bedtime.      Marland Kitchen  glimepiride (AMARYL) 4 MG tablet Take 4 mg by mouth daily before breakfast.      . hyoscyamine (LEVSIN, ANASPAZ) 0.125 MG tablet Take 0.125 mg by mouth every 4 (four) hours as needed. For cramping/bloating/diarrhea      . Ibuprofen (ADVIL PO) Take 1-2 tablets by mouth every 6 (six) hours as needed. For pain. Pt can take 1 or 2 tablets for pain      . levothyroxine (SYNTHROID, LEVOTHROID) 50 MCG tablet Take 50 mcg by mouth daily.      . meloxicam (MOBIC) 15 MG tablet Take 15 mg by mouth daily.         No Known Allergies  ROS: (Limited by the patient's  fair mental status and the recall of the patient's daughter/granddaughter) Constitutional:  No sweats.  Weight stable.  ?Chills Eyes:  No vision changes, No discharge HENT:  No sore throats, nasal drainage Lymph: No neck swelling, No bruising easily Pulmonary:  No cough, productive sputum CV: No orthopnea, PND  No exertional chest/neck/shoulder/arm pain. GI: No personal nor family history of GI/colon cancer, inflammatory bowel disease, irritable bowel syndrome, allergy such as Celiac Sprue, dietary/dairy problems, colitis, ulcers nor gastritis.  No recent sick contacts/gastroenteritis.  No travel outside the country.  No changes in diet. Renal: No UTIs, No hematuria Genital:  No drainage, bleeding, masses Musculoskeletal: No severe joint pain.  Good ROM major joints Skin:  No sores or lesions.  No rashes Heme/Lymph:  No easy bleeding.  No swollen lymph nodes  BP 108/56  Pulse 97  Temp 100 F (37.8 C)  Resp 22  SpO2 99%  Physical Exam: General: Pt awake, oriented x4 at best in mild acute distress Eyes: PERRL, normal EOM. Sclera nonicteric Neuro: CN II-XII intact w/o focal sensory/motor deficits. Lymph: No head/neck/groin lymphadenopathy Psych:  No psychosis/paranoia.  Mildly confused at times.  Mildly groggy HENT: Normocephalic, Mucus membranes moist.  No thrush Neck: Supple, No tracheal deviation Chest: No pain.  Good respiratory excursion. CV:  Pulses intact.  Regular rhythm Abdomen: Soft, Nondistended.  Diffusely tender lower>upper TTP.  No incarcerated hernias.  No incisions Ext:  SCDs BLE.  No significant edema.  No cyanosis Skin: No petechiae / purpurae  Results:   Labs: Results for orders placed during the hospital encounter of 03/24/12 (from the past 48 hour(s))  URINALYSIS, ROUTINE W REFLEX MICROSCOPIC     Status: Abnormal   Collection Time   03/24/12  9:55 PM      Component Value Range Comment   Color, Urine AMBER (*) YELLOW  BIOCHEMICALS MAY BE AFFECTED BY COLOR    APPearance CLOUDY (*) CLEAR     Specific Gravity, Urine 1.024  1.005 - 1.030     pH 5.0  5.0 - 8.0     Glucose, UA 100 (*) NEGATIVE (mg/dL)    Hgb urine dipstick NEGATIVE  NEGATIVE     Bilirubin Urine SMALL (*) NEGATIVE     Ketones, ur 15 (*) NEGATIVE (mg/dL)    Protein, ur 30 (*) NEGATIVE (mg/dL)    Urobilinogen, UA 0.2  0.0 - 1.0 (mg/dL)    Nitrite NEGATIVE  NEGATIVE     Leukocytes, UA TRACE (*) NEGATIVE    URINE MICROSCOPIC-ADD ON     Status: Abnormal   Collection Time   03/24/12  9:55 PM      Component Value Range Comment   Squamous Epithelial / LPF RARE  RARE     WBC, UA 0-2  <3 (WBC/hpf)  Bacteria, UA RARE  RARE     Casts HYALINE CASTS (*) NEGATIVE     Crystals CA OXALATE CRYSTALS (*) NEGATIVE     Urine-Other AMORPHOUS URATES/PHOSPHATES   MUCOUS PRESENT  CBC     Status: Normal   Collection Time   03/24/12 10:00 PM      Component Value Range Comment   WBC 5.0  4.0 - 10.5 (K/uL)    RBC 4.42  3.87 - 5.11 (MIL/uL)    Hemoglobin 12.7  12.0 - 15.0 (g/dL)    HCT 45.4  09.8 - 11.9 (%)    MCV 88.5  78.0 - 100.0 (fL)    MCH 28.7  26.0 - 34.0 (pg)    MCHC 32.5  30.0 - 36.0 (g/dL)    RDW 14.7  82.9 - 56.2 (%)    Platelets 158  150 - 400 (K/uL)   DIFFERENTIAL     Status: Normal   Collection Time   03/24/12 10:00 PM      Component Value Range Comment   Neutrophils Relative 75  43 - 77 (%)    Lymphocytes Relative 18  12 - 46 (%)    Monocytes Relative 5  3 - 12 (%)    Eosinophils Relative 2  0 - 5 (%)    Basophils Relative 0  0 - 1 (%)    Neutro Abs 3.7  1.7 - 7.7 (K/uL)    Lymphs Abs 0.9  0.7 - 4.0 (K/uL)    Monocytes Absolute 0.3  0.1 - 1.0 (K/uL)    Eosinophils Absolute 0.1  0.0 - 0.7 (K/uL)    Basophils Absolute 0.0  0.0 - 0.1 (K/uL)    WBC Morphology DOHLE BODIES     COMPREHENSIVE METABOLIC PANEL     Status: Abnormal   Collection Time   03/24/12 10:00 PM      Component Value Range Comment   Sodium 139  135 - 145 (mEq/L)    Potassium 4.5  3.5 - 5.1 (mEq/L)    Chloride  107  96 - 112 (mEq/L)    CO2 15 (*) 19 - 32 (mEq/L)    Glucose, Bld 87  70 - 99 (mg/dL)    BUN 37 (*) 6 - 23 (mg/dL)    Creatinine, Ser 1.30 (*) 0.50 - 1.10 (mg/dL)    Calcium 9.3  8.4 - 10.5 (mg/dL)    Total Protein 5.5 (*) 6.0 - 8.3 (g/dL)    Albumin 2.3 (*) 3.5 - 5.2 (g/dL)    AST 24  0 - 37 (U/L)    ALT 7  0 - 35 (U/L)    Alkaline Phosphatase 39  39 - 117 (U/L)    Total Bilirubin 1.0  0.3 - 1.2 (mg/dL)    GFR calc non Af Amer 17 (*) >90 (mL/min)    GFR calc Af Amer 19 (*) >90 (mL/min)   APTT     Status: Normal   Collection Time   03/24/12 10:00 PM      Component Value Range Comment   aPTT 30  24 - 37 (seconds)   PROTIME-INR     Status: Abnormal   Collection Time   03/24/12 10:00 PM      Component Value Range Comment   Prothrombin Time 17.9 (*) 11.6 - 15.2 (seconds)    INR 1.45  0.00 - 1.49    TROPONIN I     Status: Normal   Collection Time   03/24/12 10:07 PM      Component  Value Range Comment   Troponin I <0.30  <0.30 (ng/mL)   PROCALCITONIN     Status: Normal   Collection Time   03/24/12 10:41 PM      Component Value Range Comment   Procalcitonin >175.00     LACTIC ACID, PLASMA     Status: Abnormal   Collection Time   03/24/12 10:42 PM      Component Value Range Comment   Lactic Acid, Venous 5.4 (*) 0.5 - 2.2 (mmol/L)     Imaging / Studies: Ct Head Wo Contrast  03/24/2012  *RADIOLOGY REPORT*  Clinical Data: Acute mental status changes with lethargy.  CT HEAD WITHOUT CONTRAST  Technique:  Contiguous axial images were obtained from the base of the skull through the vertex without contrast.  Comparison: MRI brain 01/15/2010, 06/06/2008.  Unenhanced cranial CT 01/13/2010.  Findings: Moderate cortical and deep atrophy, mild cerebellar atrophy, and severe changes of small vessel disease of the white matter diffusely, unchanged.  Physiologic calcifications in the right basal ganglia, unchanged. No mass lesion.  No midline shift. No acute hemorrhage or hematoma.  No extra-axial  fluid collections. No evidence of acute infarction.  No significant interval change.  Severe changes of hyperostosis frontalis interna bilaterally. Visualized paranasal sinuses, mastoid air cells, and middle ear cavities well-aerated.  Severe bilateral carotid siphon and vertebral artery atherosclerosis.  IMPRESSION:  1.  No acute intracranial abnormality. 2.  Stable moderate generalized atrophy and severe chronic microvascular ischemic changes of the white matter diffusely.  Original Report Authenticated By: Arnell Sieving, M.D.   Dg Chest Portable 1 View  03/24/2012  *RADIOLOGY REPORT*  Clinical Data: Altered mental status, hypertension  PORTABLE CHEST - 1 VIEW  Comparison: 01/13/2010  Findings: Cardiomegaly.  Central vascular congestion. Aortic arch atherosclerosis.  No overt edema or pneumothorax.  Hemidiaphragm elevation obscures the lung bases.  Small effusions not excluded. Mild lung base opacities.  Osteopenia.  Shoulder degenerative changes and multilevel degenerative changes of the thoracic vertebrae.  IMPRESSION: Prominent cardiomediastinal contours, similar to prior.  Mild lung base opacities; atelectasis versus infiltrate.  Original Report Authenticated By: Waneta Martins, M.D.   Dg Shoulder Left  02/26/2012  *RADIOLOGY REPORT*  Clinical Data: Left shoulder pain.  LEFT SHOULDER - 2+ VIEW  Comparison: None.  Findings: There is no fracture or dislocation.  Advanced acromioclavicular degenerative disease is noted.  Imaged left lung and ribs appear normal.  IMPRESSION: No acute finding.  Advanced acromioclavicular marrow disease.  Original Report Authenticated By: Bernadene Bell. Maricela Curet, M.D.   Dg Abd 2 Views  03/24/2012  *RADIOLOGY REPORT*  Clinical Data: Abdominal pain and tenderness.  ABDOMEN - 2 VIEW  Comparison: CT abdomen pelvis 12/30/2008.  Findings: Bowel gas pattern unremarkable without evidence of obstruction or significant ileus.  Free intraperitoneal air identified adjacent to the  liver on the lateral decubitus view. Aorto-iliac atherosclerosis.  No visible opaque urinary tract calculi.  Osteopenia, severe degenerative changes involving the lumbar spine, and severe degenerative changes involving the right sacroiliac joint and both hips.  IMPRESSION: Pneumoperitoneum.  Critical Value/emergent results were called by telephone at the time of interpretation on 03/24/2012  at 2315 hours  to  Dr. Oletta Lamas of the emergency department, who verbally acknowledged these results.  Original Report Authenticated By: Arnell Sieving, M.D.   Dg Humerus Left  02/26/2012  *RADIOLOGY REPORT*  Clinical Data: Pain.  LEFT HUMERUS - 2+ VIEW  Comparison: None.  Findings: No acute bony or joint abnormality is identified.  The  patient has severe appearing acromioclavicular degenerative disease.  IMPRESSION: No acute finding.  Severe acromioclavicular degenerative disease.  Original Report Authenticated By: Bernadene Bell. Maricela Curet, M.D.   Ct Head Wo Contrast  03/24/2012  *RADIOLOGY REPORT*  Clinical Data: Acute mental status changes with lethargy.  CT HEAD WITHOUT CONTRAST  Technique:  Contiguous axial images were obtained from the base of the skull through the vertex without contrast.  Comparison: MRI brain 01/15/2010, 06/06/2008.  Unenhanced cranial CT 01/13/2010.  Findings: Moderate cortical and deep atrophy, mild cerebellar atrophy, and severe changes of small vessel disease of the white matter diffusely, unchanged.  Physiologic calcifications in the right basal ganglia, unchanged. No mass lesion.  No midline shift. No acute hemorrhage or hematoma.  No extra-axial fluid collections. No evidence of acute infarction.  No significant interval change.  Severe changes of hyperostosis frontalis interna bilaterally. Visualized paranasal sinuses, mastoid air cells, and middle ear cavities well-aerated.  Severe bilateral carotid siphon and vertebral artery atherosclerosis.  IMPRESSION:  1.  No acute intracranial  abnormality. 2.  Stable moderate generalized atrophy and severe chronic microvascular ischemic changes of the white matter diffusely.  Original Report Authenticated By: Arnell Sieving, M.D.   Dg Chest Portable 1 View  03/24/2012  *RADIOLOGY REPORT*  Clinical Data: Altered mental status, hypertension  PORTABLE CHEST - 1 VIEW  Comparison: 01/13/2010  Findings: Cardiomegaly.  Central vascular congestion. Aortic arch atherosclerosis.  No overt edema or pneumothorax.  Hemidiaphragm elevation obscures the lung bases.  Small effusions not excluded. Mild lung base opacities.  Osteopenia.  Shoulder degenerative changes and multilevel degenerative changes of the thoracic vertebrae.  IMPRESSION: Prominent cardiomediastinal contours, similar to prior.  Mild lung base opacities; atelectasis versus infiltrate.  Original Report Authenticated By: Waneta Martins, M.D.   Dg Shoulder Left  02/26/2012  *RADIOLOGY REPORT*  Clinical Data: Left shoulder pain.  LEFT SHOULDER - 2+ VIEW  Comparison: None.  Findings: There is no fracture or dislocation.  Advanced acromioclavicular degenerative disease is noted.  Imaged left lung and ribs appear normal.  IMPRESSION: No acute finding.  Advanced acromioclavicular marrow disease.  Original Report Authenticated By: Bernadene Bell. D'ALESSIO, M.D.    CT scan of abdomen pelvis and just done. No contrast given elevated creatinine. Pockets of free air in the upper abdomen. Fluid around the liver and pelvis..Around the stomach. Colon not particularly inflamed. However, one segment in the transverse colon somewhat decompressed and mildly thickened. No definite gas around it. No obvious metastatic cancer. A renal cyst. Some atherosclerotic changes. No obvious hernia. No small bowel obstruction. Moderate stool burden but not severe and improved compared to 3 years ago.   I reviewed this with Dr. Lyman Bishop with radiology. He and I feel that gastric ulcer perforation as the most likely  etiology.  Antibiotics: Anti-infectives     Start     Dose/Rate Route Frequency Ordered Stop   03/24/12 2345  piperacillin-tazobactam (ZOSYN) IVPB 3.375 g       3.375 g 100 mL/hr over 30 Minutes Intravenous  Once 03/24/12 2341 03/25/12 0015          Assessment  Elaijah R Grisso  78 y.o. female       Problem List:  Principal Problem:  *Peritonitis Active Problems:  Intra-abdominal free air of unknown etiology  AKI (acute kidney injury)  Lactic acidosis  Severe sepsis  Delirium  Diabetes mellitus  Hypertension  Increased confusion, shock, renal failure in the setting of abdominal pain and probable free air. Strongly suspicious  for perforated viscus, gastric ulcer most likely cause  Plan: -IV ABX - Zosyn OK to start -Agree with Pulmonary/CCM involvement.  ICU admission -The patient requires emergent abdominal exploration. Because it is a ulcer, I think is reasonable to start a laparoscopic D&C Vicodin a laparoscopic omental Cheree Ditto patch. I will have a low threshold to convert to open if the diagnosis is not clear or if her blood pressure will not tolerated it. I did discuss this with the patient and her and family.  I had a very serious discussion with the patient, her daughter, and her granddaughter. This is a life-threatening issue. If she has perforation, the risk of death is extremely high. Understandably, she is not an ideal operative candidate and this is a stressful situation with evidence of organ failure. However, I think she will require surgery to save her life.  The anatomy & physiology of the digestive tract was discussed.  The pathophysiology of perforation was discussed.  Differential diagnosis such as perforated ulcer or colon, etc was discussed.   Natural history risks without surgery such as death was discussed.  I recommended abdominal exploration to diagnose & treat the source of the problem.  Laparoscopic & open techniques were discussed.   Risks such as  bleeding, infection, abscess, leak, reoperation, bowel resection, possible ostomy, hernia, heart attack, death, and other risks were discussed.   The risks of no intervention will lead to serious problems including death.   I expressed a good likelihood that surgery will address the problem.    Goals of post-operative recovery were discussed as well.  We will work to minimize complications although risks in an emergent setting are high.   Questions were answered.  The patient expressed understanding & wishes to proceed with surgery.       -Follow hypothyroidism. -Hold off on any blood pressure meds. -Hydrate for presumed shock etiology of her renal failure. -Hold off on any nonsteroidals at this time.  PPI -VTE prophylaxis- SCDs, etc -mobilize as tolerated to help recovery  Ardeth Sportsman, M.D., F.A.C.S. Gastrointestinal and Minimally Invasive Surgery [facility] [address] [place], [place] [address] [phone] Main / Paging [phone] Voice Mail   03/25/2012

## 2012-03-25 NOTE — Progress Notes (Signed)
78yo female now called as code sepsis to add fluconazole for peritonitis.  Will begin fluconazole 200mg  IV Q24H for CrCl ~19 ml/hr.  Vernard Gambles, PharmD, BCPS 03/25/2012 12:52 AM

## 2012-03-25 NOTE — Discharge Instructions (Signed)
LAPAROSCOPIC SURGERY: POST OP INSTRUCTIONS ° °1. DIET: Follow a light bland diet the first 24 hours after arrival home, such as soup, liquids, crackers, etc.  Be sure to include lots of fluids daily.  Avoid fast food or heavy meals as your are more likely to get nauseated.  Eat a low fat the next few days after surgery.   °2. Take your usually prescribed home medications unless otherwise directed. °3. PAIN CONTROL: °a. Pain is best controlled by a usual combination of three different methods TOGETHER: °i. Ice/Heat °ii. Over the counter pain medication °iii. Prescription pain medication °b. Most patients will experience some swelling and bruising around the incisions.  Ice packs or heating pads (30-60 minutes up to 6 times a day) will help. Use ice for the first few days to help decrease swelling and bruising, then switch to heat to help relax tight/sore spots and speed recovery.  Some people prefer to use ice alone, heat alone, alternating between ice & heat.  Experiment to what works for you.  Swelling and bruising can take several weeks to resolve.   °c. It is helpful to take an over-the-counter pain medication regularly for the first few weeks.  Choose one of the following that works best for you: °i. Naproxen (Aleve, etc)  Two 220mg tabs twice a day °ii. Ibuprofen (Advil, etc) Three 200mg tabs four times a day (every meal & bedtime) °iii. Acetaminophen (Tylenol, etc) 500-650mg four times a day (every meal & bedtime) °d. A  prescription for pain medication (such as oxycodone, hydrocodone, etc) should be given to you upon discharge.  Take your pain medication as prescribed.  °i. If you are having problems/concerns with the prescription medicine (does not control pain, nausea, vomiting, rash, itching, etc), please call us [phone] to see if we need to switch you to a different pain medicine that will work better for you and/or control your side effect better. °ii. If you need a refill on your pain medication,  please contact your pharmacy.  They will contact our office to request authorization. Prescriptions will not be filled after 5 pm or on week-ends. °4. Avoid getting constipated.  Between the surgery and the pain medications, it is common to experience some constipation.  Increasing fluid intake and taking a fiber supplement (such as Metamucil, Citrucel, FiberCon, MiraLax, etc) 1-2 times a day regularly will usually help prevent this problem from occurring.  A mild laxative (prune juice, Milk of Magnesia, MiraLax, etc) should be taken according to package directions if there are no bowel movements after 48 hours.   °5. Watch out for diarrhea.  If you have many loose bowel movements, simplify your diet to bland foods & liquids for a few days.  Stop any stool softeners and decrease your fiber supplement.  Switching to mild anti-diarrheal medications (Kayopectate, Pepto Bismol) can help.  If this worsens or does not improve, please call us. °6. Wash / shower every day.  You may shower over the dressings as they are waterproof.  Continue to shower over incision(s) after the dressing is off. °7. Remove your waterproof bandages 5 days after surgery.  You may leave the incision open to air.  You may replace a dressing/Band-Aid to cover the incision for comfort if you wish.  °8. ACTIVITIES as tolerated:   °a. You may resume regular (light) daily activities beginning the next day--such as daily self-care, walking, climbing stairs--gradually increasing activities as tolerated.  If you can walk 30 minutes without difficulty, it   is safe to try more intense activity such as jogging, treadmill, bicycling, low-impact aerobics, swimming, etc. °b. Save the most intensive and strenuous activity for last such as sit-ups, heavy lifting, contact sports, etc  Refrain from any heavy lifting or straining until you are off narcotics for pain control.   °c. DO NOT PUSH THROUGH PAIN.  Let pain be your guide: If it hurts to do something, don't  do it.  Pain is your body warning you to avoid that activity for another week until the pain goes down. °d. You may drive when you are no longer taking prescription pain medication, you can comfortably wear a seatbelt, and you can safely maneuver your car and apply brakes. °e. You may have sexual intercourse when it is comfortable.  °9. FOLLOW UP in our office °a. Please call [facility] at [phone] to set up an appointment to see your surgeon in the office for a follow-up appointment approximately 2-3 weeks after your surgery. °b. Make sure that you call for this appointment the day you arrive home to insure a convenient appointment time. °10. IF YOU HAVE DISABILITY OR FAMILY LEAVE FORMS, BRING THEM TO THE OFFICE FOR PROCESSING.  DO NOT GIVE THEM TO YOUR DOCTOR. ° ° °WHEN TO CALL US [phone]: °1. Poor pain control °2. Reactions / problems with new medications (rash/itching, nausea, etc)  °3. Fever over 101.5 F (38.5 C) °4. Inability to urinate °5. Nausea and/or vomiting °6. Worsening swelling or bruising °7. Continued bleeding from incision. °8. Increased pain, redness, or drainage from the incision ° ° The clinic staff is available to answer your questions during regular business hours (8:30am-5pm).  Please don’t hesitate to call and ask to speak to one of our nurses for clinical concerns.  ° If you have a medical emergency, go to the nearest emergency room or call 911. ° A surgeon from [facility] is always on call at the hospitals ° ° °[facility] °[address], [address], [place], [place]  [address] ? °MAIN: [phone] ? TOLL FREE: [phone] ?  °FAX [phone] °[email or web address] ° °DRAIN CARE:   °You have a closed bulb drain to help you heal. ° °A bulb drain is a small, plastic reservoir which creates a gentle suction. It is used to remove excess fluid from a surgical wound. The color and amount of fluid will vary. Immediately after surgery, the  fluid is bright red. It may gradually change to a yellow color. When the amount decreases to about 1 or 2 tablespoons (15 to 30 cc) per 24 hours, your caregiver will usually remove it. ° °DAILY CARE °· Keep the bulb compressed at all times, except while emptying it. The compression creates suction.  °· Keep sites where the tubes enter the skin dry and covered with a light bandage (dressing).  °· Tape the tubes to your skin, 1 to 2 inches below the insertion sites, to keep from pulling on your stitches. Tubes are stitched in place and will not slip out.  °· Pin the bulb to your shirt (not to your pants) with a safety pin.  °· For the first few days after surgery, there usually is more fluid in the bulb. Empty the bulb whenever it becomes half full because the bulb does not create enough suction if it is too full. Include this amount in your 24 hour totals.  °· When the amount of drainage decreases, empty the bulb at the same time every   day. Write down the amounts and the 24 hour totals. Your caregiver will want to know them. This helps your caregiver know when the tubes can be removed.  °· (We anticipate removing the drain in 1-3 weeks, depending on when the output is <30mL a day for 2+ days) °· If there is drainage around the tube sites, change dressings and keep the area dry. If you see a clot in the tube, leave it alone. However, if the tube does not appear to be draining, let your caregiver know.  °TO EMPTY THE BULB °· Open the stopper to release suction.  °· Holding the stopper out of the way, pour drainage into the measuring cup that was sent home with you.  °· Measure and write down the amount. If there are 2 bulbs, note the amount of drainage from bulb 1 or bulb 2 and keep the totals separate. Your caregiver will want to know which tube is draining more.  °· Compress the bulb by folding it in half.  °· Replace the stopper.  °· Check the tape that holds the tube to your skin, and pin the bulb to your shirt.    °SEEK MEDICAL CARE IF: °· The drainage develops a bad odor.  °· You have an oral temperature above 102° F (38.9° C).  °· The amount of drainage from your wound suddenly increases or decreases.  °· You accidentally pull out your drain.  °· You have any other questions or concerns.  °MAKE SURE YOU:  °· Understand these instructions.  °· Will watch your condition.  °· Will get help right away if you are not doing well or get worse.  ° ° °· Call our office if you have any questions about your drain. [phone] °·  °

## 2012-03-25 NOTE — Progress Notes (Signed)
eLink Physician-Brief Progress Note Patient Name: Megan [name] DOB: November 24, 1926 MRN: [id number]  Date of Service  03/25/2012   HPI/Events of Note     eICU Interventions  Dc bicarb gtt, serum bicarb improved   Intervention Category Intermediate Interventions: Diagnostic test evaluation  Konrad Hoak V. 03/25/2012, 9:57 PM

## 2012-03-25 NOTE — Transfer of Care (Signed)
Immediate Anesthesia Transfer of Care Note  Patient: Megan [name]  Procedure(s) Performed: Procedure(s) (LRB): LAPAROSCOPY DIAGNOSTIC (N/A) REPAIR OF PERFORATED ULCER (N/A)  Patient Location: ICU  Anesthesia Type: General  Level of Consciousness: sedated and unresponsive  Airway & Oxygen Therapy: Patient remains intubated per anesthesia plan and Patient placed on Ventilator (see vital sign flow sheet for setting)  Post-op Assessment: Post vital signs: Reviewed and stable  Complications: No apparent anesthesia complications

## 2012-03-25 NOTE — Anesthesia Postprocedure Evaluation (Signed)
  Anesthesia Post-op Note  Patient: Megan [name]  Procedure(s) Performed: Procedure(s) (LRB): LAPAROSCOPY DIAGNOSTIC (N/A) REPAIR OF PERFORATED ULCER (N/A)  Patient Location: ICU  Anesthesia Type: General  Level of Consciousness: sedated and unresponsive  Airway and Oxygen Therapy: Patient remains intubated per anesthesia plan  Post-op Pain: none  Post-op Assessment: Post-op Vital signs reviewed, Patient's Cardiovascular Status Stable and Respiratory Function Stable  Post-op Vital Signs: Reviewed and stable  Complications: No apparent anesthesia complications

## 2012-03-25 NOTE — Preoperative (Signed)
Beta Blockers   Reason not to administer Beta Blockers:Not Applicable 

## 2012-03-25 NOTE — ED Notes (Signed)
CCM MD at bedside.  

## 2012-03-26 ENCOUNTER — Inpatient Hospital Stay (HOSPITAL_COMMUNITY): Payer: Medicare Other

## 2012-03-26 DIAGNOSIS — J9600 Acute respiratory failure, unspecified whether with hypoxia or hypercapnia: Secondary | ICD-10-CM

## 2012-03-26 LAB — POCT I-STAT 3, ART BLOOD GAS (G3+)
O2 Saturation: 100 %
Patient temperature: 98.6
pCO2 arterial: 25.9 mmHg — ABNORMAL LOW (ref 35.0–45.0)
pO2, Arterial: 173 mmHg — ABNORMAL HIGH (ref 80.0–100.0)

## 2012-03-26 LAB — GLUCOSE, CAPILLARY
Glucose-Capillary: 108 mg/dL — ABNORMAL HIGH (ref 70–99)
Glucose-Capillary: 136 mg/dL — ABNORMAL HIGH (ref 70–99)
Glucose-Capillary: 140 mg/dL — ABNORMAL HIGH (ref 70–99)
Glucose-Capillary: 65 mg/dL — ABNORMAL LOW (ref 70–99)

## 2012-03-26 LAB — BASIC METABOLIC PANEL
BUN: 37 mg/dL — ABNORMAL HIGH (ref 6–23)
Calcium: 7.7 mg/dL — ABNORMAL LOW (ref 8.4–10.5)
Creatinine, Ser: 1.68 mg/dL — ABNORMAL HIGH (ref 0.50–1.10)
GFR calc Af Amer: 32 mL/min — ABNORMAL LOW (ref >90)
GFR calc non Af Amer: 27 mL/min — ABNORMAL LOW (ref >90)
Glucose, Bld: 152 mg/dL — ABNORMAL HIGH (ref 70–99)
Potassium: 3.4 mEq/L — ABNORMAL LOW (ref 3.5–5.1)

## 2012-03-26 LAB — CBC
HCT: 30.6 % — ABNORMAL LOW (ref 36.0–46.0)
Hemoglobin: 10.3 g/dL — ABNORMAL LOW (ref 12.0–15.0)
MCH: 28.5 pg (ref 26.0–34.0)
MCHC: 33.7 g/dL (ref 30.0–36.0)
MCV: 84.5 fL (ref 78.0–100.0)
RDW: 14.3 % (ref 11.5–15.5)

## 2012-03-26 MED ORDER — DEXTROSE 50 % IV SOLN
INTRAVENOUS | Status: AC
  Filled 2012-03-26: qty 50

## 2012-03-26 MED ORDER — POTASSIUM CHLORIDE 10 MEQ/50ML IV SOLN
10.0000 meq | INTRAVENOUS | Status: DC
  Administered 2012-03-26: 10 meq via INTRAVENOUS
  Filled 2012-03-26: qty 50

## 2012-03-26 MED ORDER — LABETALOL HCL 5 MG/ML IV SOLN
5.0000 mg | INTRAVENOUS | Status: DC | PRN
  Administered 2012-03-28: 5 mg via INTRAVENOUS
  Filled 2012-03-26 (×2): qty 4

## 2012-03-26 MED ORDER — DEXTROSE-NACL 5-0.45 % IV SOLN
INTRAVENOUS | Status: DC
  Administered 2012-03-26 – 2012-03-28 (×5): via INTRAVENOUS

## 2012-03-26 MED ORDER — INSULIN ASPART 100 UNIT/ML ~~LOC~~ SOLN
0.0000 [IU] | SUBCUTANEOUS | Status: DC
  Administered 2012-03-27 – 2012-03-28 (×6): 1 [IU] via SUBCUTANEOUS
  Administered 2012-03-29 (×2): 2 [IU] via SUBCUTANEOUS
  Administered 2012-03-29: 1 [IU] via SUBCUTANEOUS
  Administered 2012-03-30: 100 [IU] via SUBCUTANEOUS
  Administered 2012-03-30: 1 [IU] via SUBCUTANEOUS
  Administered 2012-03-31: 2 [IU] via SUBCUTANEOUS
  Administered 2012-04-01: 1 [IU] via SUBCUTANEOUS

## 2012-03-26 MED ORDER — PIPERACILLIN-TAZOBACTAM 3.375 G IVPB
3.3750 g | Freq: Three times a day (TID) | INTRAVENOUS | Status: DC
  Administered 2012-03-26 – 2012-03-31 (×15): 3.375 g via INTRAVENOUS
  Filled 2012-03-26 (×19): qty 50

## 2012-03-26 MED ORDER — POTASSIUM CHLORIDE 10 MEQ/50ML IV SOLN
10.0000 meq | INTRAVENOUS | Status: AC
  Administered 2012-03-26 (×3): 10 meq via INTRAVENOUS
  Filled 2012-03-26 (×2): qty 50

## 2012-03-26 MED ORDER — INSULIN ASPART 100 UNIT/ML ~~LOC~~ SOLN: 0.0000 [IU] | SUBCUTANEOUS | Status: DC

## 2012-03-26 MED ORDER — DEXTROSE 50 % IV SOLN
25.0000 mL | Freq: Once | INTRAVENOUS | Status: AC | PRN
  Administered 2012-03-26: 25 mL via INTRAVENOUS
  Filled 2012-03-26: qty 50

## 2012-03-26 MED ORDER — NOREPINEPHRINE BITARTRATE 1 MG/ML IJ SOLN
2.0000 ug/min | INTRAVENOUS | Status: DC
  Filled 2012-03-26: qty 4

## 2012-03-26 MED ORDER — SODIUM CHLORIDE 0.9 % IV SOLN: INTRAVENOUS | Status: DC

## 2012-03-26 NOTE — Procedures (Signed)
Pt. Extubated per MD order.  Placed on 4L [place]. Pt tolerated well, HR 98, RR 26, pox 100%, BS mild rhonchi bilaterally, BP 158/62, pt able to vocalize.

## 2012-03-26 NOTE — Progress Notes (Signed)
ANTIBIOTIC CONSULT NOTE - FOLLOW UP  Pharmacy Consult for Diflucan/Zosyn Indication: peritonitis  No Known Allergies  Patient Measurements: Height: 5\' 8"  (172.7 cm) Weight: 148 lb 9.4 oz (67.4 kg) IBW/kg (Calculated) : 63.9   Vital Signs: Temp: 98.2 F (36.8 C) (05/25 0825) Temp src: Oral (05/25 0437) BP: 102/47 mmHg (05/25 1000) Pulse Rate: 66  (05/25 0600) Intake/Output from previous day: 05/24 0701 - 05/25 0700 In: 2572.5 [I.V.:1712.5; IV Piggyback:760] Out: 915 [Urine:745; Drains:170] Intake/Output from this shift: Total I/O In: 300 [I.V.:300] Out: -   Labs:  Basename 03/26/12 0430 03/25/12 1900 03/25/12 0430 03/25/12 0227 03/24/12 2200  WBC 14.3* -- 7.6 -- 5.0  HGB 10.3* -- 12.2 11.9* --  PLT 166 -- 173 -- 158  LABCREA -- -- -- -- --  CREATININE 1.68* 1.63* 2.25* -- --   Estimated Creatinine Clearance: 26 ml/min (by C-G formula based on Cr of 1.68).   Microbiology: Recent Results (from the past 720 hour(s))  URINE CULTURE     Status: Normal   Collection Time   03/24/12  9:55 PM      Component Value Range Status Comment   Specimen Description URINE, CATHETERIZED   Final    Special Requests Normal   Final    Culture  Setup Time [id number]   Final    Colony Count NO GROWTH   Final    Culture NO GROWTH   Final    Report Status 03/25/2012 FINAL   Final   CULTURE, BLOOD (ROUTINE X 2)     Status: Normal (Preliminary result)   Collection Time   03/24/12 10:00 PM      Component Value Range Status Comment   Specimen Description BLOOD HAND RIGHT   Final    Special Requests BOTTLES DRAWN AEROBIC ONLY 4CC   Final    Culture  Setup Time [id number]   Final    Culture     Final    Value:        BLOOD CULTURE RECEIVED NO GROWTH TO DATE CULTURE WILL BE HELD FOR 5 DAYS BEFORE ISSUING A FINAL NEGATIVE REPORT   Report Status PENDING   Incomplete   CULTURE, BLOOD (ROUTINE X 2)     Status: Normal (Preliminary result)   Collection Time   03/24/12 10:30 PM      Component  Value Range Status Comment   Specimen Description BLOOD ARM RIGHT   Final    Special Requests BOTTLES DRAWN AEROBIC ONLY 1CC   Final    Culture  Setup Time [id number]   Final    Culture     Final    Value:        BLOOD CULTURE RECEIVED NO GROWTH TO DATE CULTURE WILL BE HELD FOR 5 DAYS BEFORE ISSUING A FINAL NEGATIVE REPORT   Report Status PENDING   Incomplete   ANAEROBIC CULTURE     Status: Normal (Preliminary result)   Collection Time   03/25/12  3:32 AM      Component Value Range Status Comment   Specimen Description FLUID PERITONEAL   Final    Special Requests NONE ON SWAB PATIENT ON FOLLOWING ZOSYN   Final    Gram Stain PENDING   Incomplete    Culture     Final    Value: NO ANAEROBES ISOLATED; CULTURE IN PROGRESS FOR 5 DAYS   Report Status PENDING   Incomplete   BODY FLUID CULTURE     Status: Normal (Preliminary result)   Collection Time  03/25/12  3:32 AM      Component Value Range Status Comment   Specimen Description FLUID PERITONEAL   Final    Special Requests NONE ON SWAB PATIENT ON FOLLOWING ZOSYN   Final    Gram Stain     Final    Value: FEW WBC PRESENT,BOTH PMN AND MONONUCLEAR     NO ORGANISMS SEEN   Culture NO GROWTH   Final    Report Status PENDING   Incomplete   MRSA PCR SCREENING     Status: Normal   Collection Time   03/25/12  4:19 AM      Component Value Range Status Comment   MRSA by PCR NEGATIVE  NEGATIVE  Final     Anti-infectives     Start     Dose/Rate Route Frequency Ordered Stop   03/26/12 1400  piperacillin-tazobactam (ZOSYN) IVPB 3.375 g       3.375 g 12.5 mL/hr over 240 Minutes Intravenous 3 times per day 03/26/12 1252     03/26/12 0600   fluconazole (DIFLUCAN) IVPB 200 mg        200 mg 100 mL/hr over 60 Minutes Intravenous Every 24 hours 03/25/12 0406     03/25/12 0600   piperacillin-tazobactam (ZOSYN) IVPB 2.25 g  Status:  Discontinued        2.25 g 100 mL/hr over 30 Minutes Intravenous 4 times per day 03/25/12 0406 03/26/12 1252    03/25/12 0415   piperacillin-tazobactam (ZOSYN) IVPB 3.375 g  Status:  Discontinued        3.375 g 100 mL/hr over 30 Minutes Intravenous  Once 03/25/12 0406 03/25/12 0452   03/25/12 0145   gentamycin 80 mg in 0.9% normal saline 250 mL irrigation  Status:  Discontinued        80 mg Irrigation Every 5 min 03/25/12 0122 03/25/12 0406   03/25/12 0145   clindamycin (CLEOCIN) IVPB 900 mg        900 mg 100 mL/hr over 30 Minutes Intravenous  Once 03/25/12 0142 03/25/12 0652   03/25/12 0130   clindamycin (CLEOCIN) injection 900 mg  Status:  Discontinued        900 mg Intravenous  Once 03/25/12 0126 03/25/12 0141   03/25/12 0100   fluconazole (DIFLUCAN) IVPB 200 mg        200 mg 100 mL/hr over 60 Minutes Intravenous  Once 03/25/12 0051 03/25/12 0735   03/24/12 2345  piperacillin-tazobactam (ZOSYN) IVPB 3.375 g       3.375 g 100 mL/hr over 30 Minutes Intravenous  Once 03/24/12 2341 03/25/12 0015          Assessment: 78 yo F POD2 repair of duodenal perforation on day 2 Diflucan and Zosyn.  Renal function continues to improve (est crcl ~26 ml/min).  Cultures: 5/24 peritoneal fluid: NGTD 5/23 blood x 2: NGTD 5/23 urine: Neg  Plan:  Increase Zosyn to 3.375 gm IV q8hr, each dose over 4 hours Continue Diflucan 200 mg IV q24hr F/up cultures, renal function  Rolland Porter, Pharm.D., BCPS Clinical Pharmacist Pager: [phone] 03/26/2012,12:52 PM

## 2012-03-26 NOTE — Progress Notes (Signed)
CBG: 51(at 1940)  Treatment: 25 cc Dextrose  Symptoms: lethargy  Follow-up CBG: Time:2014 CBG Result:115  Possible Reasons for Event: NPO  Comments/MD notified:Notified Dr. Herma Carson in [facility]ink. Started pt on D51/2NS @100 /hr    Crist Fat Grayson

## 2012-03-26 NOTE — Progress Notes (Signed)
Name: Megan [name] MRN: [id number] DOB: 1927/05/04    LOS: 2  Referring Provider:  Oletta Lamas Reason for Referral:  Free air in abdomen  PULMONARY / CRITICAL CARE MEDICINE  Brief patient description:  78 y/o female with DM, HTN admitted on 5/23 with free peritoneal air likely related to recent constipation, possible sbo.  She has acute renal failure and a lactic acidosis both likely related to peritonitis.  Events Since Admission: 5/24 CT Abdomen >> Pneumoperitoneum with ascites, diffuse colonic diverticulosis, Peritoneal carcinomatosis is suspected 5/23 CT head >>> no acute findings  5/24 OR for duodenal perforation  Current Status: Pressors started overnight.  Tolerated pressure support.  Vital Signs: Temp:  [97.4 F (36.3 C)-98.8 F (37.1 C)] 98.8 F (37.1 C) (05/25 0437) Pulse Rate:  [25-88] 66  (05/25 0600) Resp:  [15-27] 15  (05/25 0600) BP: (67-119)/(32-78) 110/43 mmHg (05/25 0600) SpO2:  [57 %-100 %] 99 % (05/25 0600) Arterial Line BP: (65-146)/(31-63) 133/60 mmHg (05/25 0600) FiO2 (%):  [40 %-50 %] 40 % (05/25 0500) Weight:  [148 lb 9.4 oz (67.4 kg)] 148 lb 9.4 oz (67.4 kg) (05/25 0449)  Physical Examination: General - no distress HEENT - ETT, and NG tube in place Cardiac - s1s2 regular Chest - scattered rhonchi Abd - soft, mild tenderness Ext - no edema Neuro - alert, follows commands  CBC    Component Value Date/Time   WBC 14.3* 03/26/2012 0430   RBC 3.62* 03/26/2012 0430   HGB 10.3* 03/26/2012 0430   HCT 30.6* 03/26/2012 0430   PLT 166 03/26/2012 0430   MCV 84.5 03/26/2012 0430   MCH 28.5 03/26/2012 0430   MCHC 33.7 03/26/2012 0430   RDW 14.3 03/26/2012 0430   LYMPHSABS 0.9 03/24/2012 2200   MONOABS 0.3 03/24/2012 2200   EOSABS 0.1 03/24/2012 2200   BASOSABS 0.0 03/24/2012 2200    BMET    Component Value Date/Time   NA 138 03/26/2012 0430   K 3.4* 03/26/2012 0430   CL 107 03/26/2012 0430   CO2 20 03/26/2012 0430   GLUCOSE 152* 03/26/2012 0430   BUN 37*  03/26/2012 0430   CREATININE 1.68* 03/26/2012 0430   CALCIUM 7.7* 03/26/2012 0430   GFRNONAA 27* 03/26/2012 0430   GFRAA 32* 03/26/2012 0430    Lab Results  Component Value Date   ALT 7 03/24/2012   AST 24 03/24/2012   ALKPHOS 39 03/24/2012   BILITOT 1.0 03/24/2012   ABG    Component Value Date/Time   PHART 7.524* 03/26/2012 0502   PCO2ART 25.9* 03/26/2012 0502   PO2ART 173.0* 03/26/2012 0502   HCO3 21.4 03/26/2012 0502   TCO2 22 03/26/2012 0502   ACIDBASEDEF 8.1* 03/25/2012 0430   O2SAT 100.0 03/26/2012 0502    Ct Abdomen Pelvis Wo Contrast  03/25/2012  *RADIOLOGY REPORT*  Clinical Data: Abdominal pain.  Sepsis.  Pneumoperitoneum on x-ray earlier.  Acute renal failure.  CT ABDOMEN AND PELVIS WITHOUT CONTRAST 03/25/2012:  Technique:  Multidetector CT imaging of the abdomen and pelvis was performed following the standard protocol without intravenous contrast.  Comparison: CT abdomen and pelvis 12/30/2008.  Findings: Moderate pneumoperitoneum and moderate ascites throughout the abdomen and pelvis.  Etiology of the gas-filled to most likely be in the stomach, as there may be intramural gas in the wall of the proximal body of the stomach.  Small bowel normal in appearance.  Large stool burden throughout decompressed colon. Diffuse colonic diverticulosis, though no extraluminal gas adjacent to the colon to suggest this  as the etiology.  Focal circumferential wall thickening involving the mid transverse colon in an area of collapsed bowel.  Normal unenhanced appearance of the liver, spleen, and adrenal glands.  Numerous cysts in both kidneys, some of which have calcifications in their walls, with the most calcification involving the wall of a left lower pole cyst.  Within the limits of the unenhanced technique, no definite solid renal masses.  No urinary tract calculi.  No hydronephrosis.  Gallbladder unremarkable by CT.  No biliary ductal dilation. Extensive aorto-iliofemoral atherosclerosis without aneurysm.   No significant lymphadenopathy.  Uterus atrophic consistent with age. No visible adnexal masses.  Phleboliths low in the pelvis.  Urinary bladder decompressed by Foley catheter.  Small right pleural effusion and associated mild passive atelectasis in the right lower lobe.  Heart enlarged.  Bone window images demonstrate severe degenerative changes involving the lumbar spine.  IMPRESSION:  1.  Moderate pneumoperitoneum and moderate ascites.  Etiology felt to most likely be in the stomach (gastric ulcer?) 2.  Diffuse colonic diverticulosis.  Focal circumferential wall thickening involving the mid transverse colon may just be due to the fact that this is the collapse segment.  A colonic mass is not entirely excluded, however. 3.  Large bilateral renal cysts, some of which have calcification in their walls because of the thickness of the calcification of the cyst in the lower pole of the left kidney, this would be [facility] centered a Bosniak category II F lesion.  The remaining cysts are Bosniak category II. 4.  Peritoneal carcinomatosis is suspected. 5.  Small right pleural effusion and associated passive atelectasis or pneumonia in the right lower lobe.  Critical results were discussed directly with Dr. Michaell Cowing of general surgery at the time of interpretation on 03/25/2012 at 0040 hours.  Original Report Authenticated By: Arnell Sieving, M.D.   Ct Head Wo Contrast  03/24/2012  *RADIOLOGY REPORT*  Clinical Data: Acute mental status changes with lethargy.  CT HEAD WITHOUT CONTRAST  Technique:  Contiguous axial images were obtained from the base of the skull through the vertex without contrast.  Comparison: MRI brain 01/15/2010, 06/06/2008.  Unenhanced cranial CT 01/13/2010.  Findings: Moderate cortical and deep atrophy, mild cerebellar atrophy, and severe changes of small vessel disease of the white matter diffusely, unchanged.  Physiologic calcifications in the right basal ganglia, unchanged. No mass lesion.  No  midline shift. No acute hemorrhage or hematoma.  No extra-axial fluid collections. No evidence of acute infarction.  No significant interval change.  Severe changes of hyperostosis frontalis interna bilaterally. Visualized paranasal sinuses, mastoid air cells, and middle ear cavities well-aerated.  Severe bilateral carotid siphon and vertebral artery atherosclerosis.  IMPRESSION:  1.  No acute intracranial abnormality. 2.  Stable moderate generalized atrophy and severe chronic microvascular ischemic changes of the white matter diffusely.  Original Report Authenticated By: Arnell Sieving, M.D.   Portable Chest Xray  03/25/2012  *RADIOLOGY REPORT*  Clinical Data:  Evaluate tube position.  PORTABLE CHEST - 1 VIEW  Comparison:  03/25/2012  Findings: Endotracheal tube tip is located 4.5 cm proximal to the carina.  Right IJ catheter tip projects over the mid SVC.  Stable cardiomediastinal contours.  Aortic arch atherosclerosis. Bibasilar opacities.  No pneumothorax.  Cannot exclude small pleural effusions.  Osteopenia and multilevel degenerative changes. NG tube side port is at the GE junction and therefore should be advanced.  IMPRESSION: Endotracheal tube tip appropriately positioned.  NG tube side port is at the GE junction and  therefore should be advanced.  Bibasilar opacities.  Original Report Authenticated By: Waneta Martins, M.D.   Dg Chest Portable 1 View  03/25/2012  *RADIOLOGY REPORT*  Clinical Data: Central venous catheter placement.  PORTABLE CHEST - 1 VIEW 03/25/2012 0114 hours:  Comparison: Portable chest x-ray yesterday.  Two-view chest x-ray 01/13/2010.  Findings: Right jugular central venous catheter tip in the SVC.  No evidence of pneumothorax or mediastinal hematoma.  Suboptimal inspiration with atelectasis in the lung bases.  Cardiac silhouette mildly enlarged but stable.  Pulmonary vascularity normal without evidence pulmonary edema.  IMPRESSION:  1.  Right jugular central venous  catheter tip in the SVC.  No acute complicating features. 2.  Suboptimal inspiration accounts for bibasilar atelectasis.  No acute cardiopulmonary disease otherwise.  Stable cardiomegaly without pulmonary edema.  Original Report Authenticated By: Arnell Sieving, M.D.   Dg Chest Portable 1 View  03/24/2012  *RADIOLOGY REPORT*  Clinical Data: Altered mental status, hypertension  PORTABLE CHEST - 1 VIEW  Comparison: 01/13/2010  Findings: Cardiomegaly.  Central vascular congestion. Aortic arch atherosclerosis.  No overt edema or pneumothorax.  Hemidiaphragm elevation obscures the lung bases.  Small effusions not excluded. Mild lung base opacities.  Osteopenia.  Shoulder degenerative changes and multilevel degenerative changes of the thoracic vertebrae.  IMPRESSION: Prominent cardiomediastinal contours, similar to prior.  Mild lung base opacities; atelectasis versus infiltrate.  Original Report Authenticated By: Waneta Martins, M.D.   Dg Abd 2 Views  03/24/2012  *RADIOLOGY REPORT*  Clinical Data: Abdominal pain and tenderness.  ABDOMEN - 2 VIEW  Comparison: CT abdomen pelvis 12/30/2008.  Findings: Bowel gas pattern unremarkable without evidence of obstruction or significant ileus.  Free intraperitoneal air identified adjacent to the liver on the lateral decubitus view. Aorto-iliac atherosclerosis.  No visible opaque urinary tract calculi.  Osteopenia, severe degenerative changes involving the lumbar spine, and severe degenerative changes involving the right sacroiliac joint and both hips.  IMPRESSION: Pneumoperitoneum.  Critical Value/emergent results were called by telephone at the time of interpretation on 03/24/2012  at 2315 hours  to  Dr. Oletta Lamas of the emergency department, who verbally acknowledged these results.  Original Report Authenticated By: Arnell Sieving, M.D.     ASSESSMENT AND PLAN  PULMONARY  ETT:  5/24>>  Acute respiratory failure, s/p surgery  Plan: -SBT>>hopefully extubate  soon -titrate oxygen to keep SpO2 > 92%  CARDIOVASCULAR  CVP: 3 ECG:  NSR, no st wave changes Lines:  5/24 CVL >>>  Echo: 2011: Mild PH (RVSP 40), mild aortic insuf, mild mitral insuf  Hypertension at baseline, now with intermittent hypotension.  Pressors started 5/25. Plan: -wean off pressors to keep SBP > 90, MAP > 65 -goal CVP 4 to 8 -continue IV fluids  Chronic diastolic dysfunction Plan: -monitor blood pressure  RENAL  Foley:  5/23 >>  Acute renal failure due to sepsis, hypotension. Plan: -continue IV fluid -monitor renal fx, urine outpt  Metabolic acidosis>>resolved.   GASTROINTESTINAL  Peritonitis s/p duodenal perforation- OR 5/24 Plan: -post op care per surgery  HEMATOLOGIC  Anemia of critical illness. Plan: -f/u CBC -transfuse for Hb < 7  INFECTIOUS  Cultures: 5/23 blood x2 >> 5/23 urine >>negative  Antibiotics: 5/23 Zosyn (peritonitis) >> 5/24 Diflucan (peritonitis) >>  Peritonitis  Plan: -D3/x zosyn, diflucan   ENDOCRINE  Lab 03/26/12 0436 03/26/12 0011 03/25/12 1952 03/25/12 1643 03/25/12 1213  GLUCAP 140* 136* 108* 116* 91   DM 2   Plan: -SSI -?if she needs TNA if  not able to have enteral nutrition soon>>defer to surgery  Hypothyroidism Plan: -IV synthroid -convert to pill when able to take po  NEUROLOGIC  Acute metabolic encephalopathy, due to severe sepsis.  Mental status improved 5/25. Plan: -monitor clinically -prn fentanyl for pain control   BEST PRACTICE / DISPOSITION - Level of Care:  ICU - Primary Service:  PCCM - Consultants:  Gen Surgery - Code Status:  Full - Diet:  npo - DVT Px:  SQ heparin - GI Px: Protonix   Critical care time 35 minutes.  Coralyn Helling, MD 03/26/2012, 7:35 AM Pager:  [phone]

## 2012-03-26 NOTE — Progress Notes (Signed)
2 Days Post-Op  Subjective: S/p laparoscopic Megan [name] patch of perforated duodenal ulcer and washout of abdomen Patient extubated, conversant; wants NG out  Lower quadrant drain - cloudy fluid Upper drain - serous drainage  Objective: Vital signs in last 24 hours: Temp:  [97.6 F (36.4 C)-98.8 F (37.1 C)] 98.8 F (37.1 C) (05/25 0437) Pulse Rate:  [25-88] 66  (05/25 0600) Resp:  [15-27] 15  (05/25 0600) BP: (67-119)/(32-78) 110/43 mmHg (05/25 0600) SpO2:  [57 %-100 %] 99 % (05/25 0600) Arterial Line BP: (65-146)/(31-63) 133/60 mmHg (05/25 0600) FiO2 (%):  [40 %-50 %] 40 % (05/25 0753) Weight:  [148 lb 9.4 oz (67.4 kg)] 148 lb 9.4 oz (67.4 kg) (05/25 0449) Last BM Date:  (unknown)  Intake/Output from previous day: 05/24 0701 - 05/25 0700 In: 2572.5 [I.V.:1712.5; IV Piggyback:760] Out: 915 [Urine:745; Drains:170] Intake/Output this shift:    General appearance: alert, cooperative and no distress GI: soft, minimal tenderness; occasional bowel sounds Laparoscopic incisions c/d/i; some drainage around JP drain site  Lab Results:   Basename 03/26/12 0430 03/25/12 0430  WBC 14.3* 7.6  HGB 10.3* 12.2  HCT 30.6* 37.1  PLT 166 173   BMET  Basename 03/26/12 0430 03/25/12 1900  NA 138 138  K 3.4* 3.8  CL 107 107  CO2 20 21  GLUCOSE 152* 133*  BUN 37* 34*  CREATININE 1.68* 1.63*  CALCIUM 7.7* 7.9*   PT/INR  Basename 03/24/12 2200  LABPROT 17.9*  INR 1.45   ABG  Basename 03/26/12 0502 03/25/12 0430  PHART 7.524* 7.335*  HCO3 21.4 16.6*    Studies/Results: Ct Abdomen Pelvis Wo Contrast  03/25/2012  *RADIOLOGY REPORT*  Clinical Data: Abdominal pain.  Sepsis.  Pneumoperitoneum on x-ray earlier.  Acute renal failure.  CT ABDOMEN AND PELVIS WITHOUT CONTRAST 03/25/2012:  Technique:  Multidetector CT imaging of the abdomen and pelvis was performed following the standard protocol without intravenous contrast.  Comparison: CT abdomen and pelvis 12/30/2008.  Findings:  Moderate pneumoperitoneum and moderate ascites throughout the abdomen and pelvis.  Etiology of the gas-filled to most likely be in the stomach, as there may be intramural gas in the wall of the proximal body of the stomach.  Small bowel normal in appearance.  Large stool burden throughout decompressed colon. Diffuse colonic diverticulosis, though no extraluminal gas adjacent to the colon to suggest this as the etiology.  Focal circumferential wall thickening involving the mid transverse colon in an area of collapsed bowel.  Normal unenhanced appearance of the liver, spleen, and adrenal glands.  Numerous cysts in both kidneys, some of which have calcifications in their walls, with the most calcification involving the wall of a left lower pole cyst.  Within the limits of the unenhanced technique, no definite solid renal masses.  No urinary tract calculi.  No hydronephrosis.  Gallbladder unremarkable by CT.  No biliary ductal dilation. Extensive aorto-iliofemoral atherosclerosis without aneurysm.  No significant lymphadenopathy.  Uterus atrophic consistent with age. No visible adnexal masses.  Phleboliths low in the pelvis.  Urinary bladder decompressed by Foley catheter.  Small right pleural effusion and associated mild passive atelectasis in the right lower lobe.  Heart enlarged.  Bone window images demonstrate severe degenerative changes involving the lumbar spine.  IMPRESSION:  1.  Moderate pneumoperitoneum and moderate ascites.  Etiology felt to most likely be in the stomach (gastric ulcer?) 2.  Diffuse colonic diverticulosis.  Focal circumferential wall thickening involving the mid transverse colon may just be due to the fact that  this is the collapse segment.  A colonic mass is not entirely excluded, however. 3.  Large bilateral renal cysts, some of which have calcification in their walls because of the thickness of the calcification of the cyst in the lower pole of the left kidney, this would be [facility] centered a  Bosniak category II F lesion.  The remaining cysts are Bosniak category II. 4.  Peritoneal carcinomatosis is suspected. 5.  Small right pleural effusion and associated passive atelectasis or pneumonia in the right lower lobe.  Critical results were discussed directly with Dr. Michaell Cowing of general surgery at the time of interpretation on 03/25/2012 at 0040 hours.  Original Report Authenticated By: Arnell Sieving, M.D.   Ct Head Wo Contrast  03/24/2012  *RADIOLOGY REPORT*  Clinical Data: Acute mental status changes with lethargy.  CT HEAD WITHOUT CONTRAST  Technique:  Contiguous axial images were obtained from the base of the skull through the vertex without contrast.  Comparison: MRI brain 01/15/2010, 06/06/2008.  Unenhanced cranial CT 01/13/2010.  Findings: Moderate cortical and deep atrophy, mild cerebellar atrophy, and severe changes of small vessel disease of the white matter diffusely, unchanged.  Physiologic calcifications in the right basal ganglia, unchanged. No mass lesion.  No midline shift. No acute hemorrhage or hematoma.  No extra-axial fluid collections. No evidence of acute infarction.  No significant interval change.  Severe changes of hyperostosis frontalis interna bilaterally. Visualized paranasal sinuses, mastoid air cells, and middle ear cavities well-aerated.  Severe bilateral carotid siphon and vertebral artery atherosclerosis.  IMPRESSION:  1.  No acute intracranial abnormality. 2.  Stable moderate generalized atrophy and severe chronic microvascular ischemic changes of the white matter diffusely.  Original Report Authenticated By: Arnell Sieving, M.D.   Portable Chest Xray  03/25/2012  *RADIOLOGY REPORT*  Clinical Data:  Evaluate tube position.  PORTABLE CHEST - 1 VIEW  Comparison:  03/25/2012  Findings: Endotracheal tube tip is located 4.5 cm proximal to the carina.  Right IJ catheter tip projects over the mid SVC.  Stable cardiomediastinal contours.  Aortic arch atherosclerosis.  Bibasilar opacities.  No pneumothorax.  Cannot exclude small pleural effusions.  Osteopenia and multilevel degenerative changes. NG tube side port is at the GE junction and therefore should be advanced.  IMPRESSION: Endotracheal tube tip appropriately positioned.  NG tube side port is at the GE junction and therefore should be advanced.  Bibasilar opacities.  Original Report Authenticated By: Waneta Martins, M.D.   Dg Chest Portable 1 View  03/25/2012  *RADIOLOGY REPORT*  Clinical Data: Central venous catheter placement.  PORTABLE CHEST - 1 VIEW 03/25/2012 0114 hours:  Comparison: Portable chest x-ray yesterday.  Two-view chest x-ray 01/13/2010.  Findings: Right jugular central venous catheter tip in the SVC.  No evidence of pneumothorax or mediastinal hematoma.  Suboptimal inspiration with atelectasis in the lung bases.  Cardiac silhouette mildly enlarged but stable.  Pulmonary vascularity normal without evidence pulmonary edema.  IMPRESSION:  1.  Right jugular central venous catheter tip in the SVC.  No acute complicating features. 2.  Suboptimal inspiration accounts for bibasilar atelectasis.  No acute cardiopulmonary disease otherwise.  Stable cardiomegaly without pulmonary edema.  Original Report Authenticated By: Arnell Sieving, M.D.   Dg Chest Portable 1 View  03/24/2012  *RADIOLOGY REPORT*  Clinical Data: Altered mental status, hypertension  PORTABLE CHEST - 1 VIEW  Comparison: 01/13/2010  Findings: Cardiomegaly.  Central vascular congestion. Aortic arch atherosclerosis.  No overt edema or pneumothorax.  Hemidiaphragm elevation obscures the  lung bases.  Small effusions not excluded. Mild lung base opacities.  Osteopenia.  Shoulder degenerative changes and multilevel degenerative changes of the thoracic vertebrae.  IMPRESSION: Prominent cardiomediastinal contours, similar to prior.  Mild lung base opacities; atelectasis versus infiltrate.  Original Report Authenticated By: Waneta Martins,  M.D.   Dg Abd 2 Views  03/24/2012  *RADIOLOGY REPORT*  Clinical Data: Abdominal pain and tenderness.  ABDOMEN - 2 VIEW  Comparison: CT abdomen pelvis 12/30/2008.  Findings: Bowel gas pattern unremarkable without evidence of obstruction or significant ileus.  Free intraperitoneal air identified adjacent to the liver on the lateral decubitus view. Aorto-iliac atherosclerosis.  No visible opaque urinary tract calculi.  Osteopenia, severe degenerative changes involving the lumbar spine, and severe degenerative changes involving the right sacroiliac joint and both hips.  IMPRESSION: Pneumoperitoneum.  Critical Value/emergent results were called by telephone at the time of interpretation on 03/24/2012  at 2315 hours  to  Dr. Oletta Lamas of the emergency department, who verbally acknowledged these results.  Original Report Authenticated By: Arnell Sieving, M.D.    Anti-infectives: Anti-infectives     Start     Dose/Rate Route Frequency Ordered Stop   03/26/12 0600   fluconazole (DIFLUCAN) IVPB 200 mg        200 mg 100 mL/hr over 60 Minutes Intravenous Every 24 hours 03/25/12 0406     03/25/12 0600  piperacillin-tazobactam (ZOSYN) IVPB 2.25 g       2.25 g 100 mL/hr over 30 Minutes Intravenous 4 times per day 03/25/12 0406     03/25/12 0415   piperacillin-tazobactam (ZOSYN) IVPB 3.375 g  Status:  Discontinued        3.375 g 100 mL/hr over 30 Minutes Intravenous  Once 03/25/12 0406 03/25/12 0452   03/25/12 0145   gentamycin 80 mg in 0.9% normal saline 250 mL irrigation  Status:  Discontinued        80 mg Irrigation Every 5 min 03/25/12 0122 03/25/12 0406   03/25/12 0145   clindamycin (CLEOCIN) IVPB 900 mg        900 mg 100 mL/hr over 30 Minutes Intravenous  Once 03/25/12 0142 03/25/12 0652   03/25/12 0130   clindamycin (CLEOCIN) injection 900 mg  Status:  Discontinued        900 mg Intravenous  Once 03/25/12 0126 03/25/12 0141   03/25/12 0100   fluconazole (DIFLUCAN) IVPB 200 mg        200  mg 100 mL/hr over 60 Minutes Intravenous  Once 03/25/12 0051 03/25/12 0735   03/24/12 2345  piperacillin-tazobactam (ZOSYN) IVPB 3.375 g       3.375 g 100 mL/hr over 30 Minutes Intravenous  Once 03/24/12 2341 03/25/12 0015          Assessment/Plan: s/p Procedure(s) (LRB): LAPAROSCOPY DIAGNOSTIC (N/A) REPAIR OF PERFORATED ULCER (N/A) Continue antibiotics - elevated WBC Continue NG tube until patient begins to have some bowel function  LOS: 2 days    Hendrix Yurkovich K. 03/26/2012

## 2012-03-26 NOTE — Progress Notes (Signed)
Name: LATONYA NELON MRN: [id number] DOB: 10/03/1927  ELECTRONIC ICU PHYSICIAN NOTE  Problem:  Second hyperglycemic episode today.  Intervention:  IVF changed to D5 1/2 NS.  SSI changed to sensitive scale.  Orlean Bradford, M.D. Pulmonary and Critical Care Medicine [facility] Cell: [phone] Pager: [phone]  03/26/2012, 7:54 PM

## 2012-03-27 ENCOUNTER — Inpatient Hospital Stay (HOSPITAL_COMMUNITY): Payer: Medicare Other

## 2012-03-27 LAB — BASIC METABOLIC PANEL
BUN: 22 mg/dL (ref 6–23)
Calcium: 7.9 mg/dL — ABNORMAL LOW (ref 8.4–10.5)
Creatinine, Ser: 0.95 mg/dL (ref 0.50–1.10)
GFR calc non Af Amer: 54 mL/min — ABNORMAL LOW (ref >90)
Glucose, Bld: 126 mg/dL — ABNORMAL HIGH (ref 70–99)
Potassium: 3.2 mEq/L — ABNORMAL LOW (ref 3.5–5.1)

## 2012-03-27 LAB — GLUCOSE, CAPILLARY
Glucose-Capillary: 123 mg/dL — ABNORMAL HIGH (ref 70–99)
Glucose-Capillary: 134 mg/dL — ABNORMAL HIGH (ref 70–99)
Glucose-Capillary: 138 mg/dL — ABNORMAL HIGH (ref 70–99)
Glucose-Capillary: 81 mg/dL (ref 70–99)

## 2012-03-27 LAB — CBC
Hemoglobin: 9.2 g/dL — ABNORMAL LOW (ref 12.0–15.0)
MCH: 28.2 pg (ref 26.0–34.0)
MCHC: 33.3 g/dL (ref 30.0–36.0)

## 2012-03-27 LAB — MAGNESIUM: Magnesium: 1.8 mg/dL (ref 1.5–2.5)

## 2012-03-27 MED ORDER — FLUCONAZOLE IN SODIUM CHLORIDE 200-0.9 MG/100ML-% IV SOLN: 200.0000 mg | INTRAVENOUS | Status: DC

## 2012-03-27 NOTE — Progress Notes (Signed)
RN went in to ck on patient. Family member had just left room less than five minutes prior. Patient had pulled out both of her IV's. Patient appears to be alert and oriented as she responds to all questions appropriately. New IV placed and wrapped with kerlix.

## 2012-03-27 NOTE — Progress Notes (Addendum)
Name: Megan [name] MRN: [id number] DOB: 05-Sep-1927    LOS: 3  Referring Provider:  Oletta Lamas Reason for Referral:  Free air in abdomen  PULMONARY / CRITICAL CARE MEDICINE  Brief patient description:  77 y/o female with DM, HTN admitted on 5/23 with free peritoneal air likely related to recent constipation, possible sbo.  She has acute renal failure and a lactic acidosis both likely related to peritonitis.  Events Since Admission: 5/24 CT Abdomen >> Pneumoperitoneum with ascites, diffuse colonic diverticulosis, Peritoneal carcinomatosis is suspected 5/23 CT head >>> no acute findings  5/24 OR for duodenal perforation  Current Status: C/o sore throat.  Denies chest pain.  Abd pain improving.  Vital Signs: Temp:  [97.5 F (36.4 C)-99.6 F (37.6 C)] 99.2 F (37.3 C) (05/26 0700) Pulse Rate:  [55-95] 81  (05/26 0700) Resp:  [14-30] 22  (05/26 0700) BP: (93-130)/(45-66) 130/66 mmHg (05/26 0700) SpO2:  [55 %-100 %] 98 % (05/26 0700) Arterial Line BP: (88-177)/(32-67) 150/56 mmHg (05/26 0700) Weight:  [156 lb 8.4 oz (71 kg)] 156 lb 8.4 oz (71 kg) (05/26 0400)  Physical Examination: General - no distress HEENT - NG tube in place Cardiac - s1s2 regular Chest - scattered rhonchi Abd - soft, mild tenderness Ext - no edema Neuro - alert, follows commands  CBC    Component Value Date/Time   WBC 14.2* 03/27/2012 0410   RBC 3.26* 03/27/2012 0410   HGB 9.2* 03/27/2012 0410   HCT 27.6* 03/27/2012 0410   PLT 149* 03/27/2012 0410   MCV 84.7 03/27/2012 0410   MCH 28.2 03/27/2012 0410   MCHC 33.3 03/27/2012 0410   RDW 14.2 03/27/2012 0410   LYMPHSABS 0.9 03/24/2012 2200   MONOABS 0.3 03/24/2012 2200   EOSABS 0.1 03/24/2012 2200   BASOSABS 0.0 03/24/2012 2200    BMET    Component Value Date/Time   NA 135 03/27/2012 0410   K 3.2* 03/27/2012 0410   CL 106 03/27/2012 0410   CO2 22 03/27/2012 0410   GLUCOSE 126* 03/27/2012 0410   BUN 22 03/27/2012 0410   CREATININE 0.95 03/27/2012 0410   CALCIUM  7.9* 03/27/2012 0410   GFRNONAA 54* 03/27/2012 0410   GFRAA 63* 03/27/2012 0410    Lab Results  Component Value Date   ALT 7 03/24/2012   AST 24 03/24/2012   ALKPHOS 39 03/24/2012   BILITOT 1.0 03/24/2012   ABG    Component Value Date/Time   PHART 7.524* 03/26/2012 0502   PCO2ART 25.9* 03/26/2012 0502   PO2ART 173.0* 03/26/2012 0502   HCO3 21.4 03/26/2012 0502   TCO2 22 03/26/2012 0502   ACIDBASEDEF 8.1* 03/25/2012 0430   O2SAT 100.0 03/26/2012 0502    Dg Chest Port 1 View  03/26/2012  *RADIOLOGY REPORT*  Clinical Data: Endotracheal tube placement  PORTABLE CHEST - 1 VIEW  Comparison: Yesterday  Findings: Normal heart size.  Endotracheal tube, NG tube, right internal jugular vein center venous catheter are stable. Increasing haziness at both lung bases likely a combination of pleural fluid and volume loss.  IMPRESSION: Increasing haziness at both lung bases as described.  Original Report Authenticated By: Donavan Burnet, M.D.     ASSESSMENT AND PLAN  PULMONARY  ETT:  5/24>>5/25  Acute respiratory failure, s/p surgery  Plan: -titrate oxygen to keep SpO2 > 92%  CARDIOVASCULAR  ECG:  NSR, no st wave changes Lines:  5/24 CVL >>>  Echo: 2011: Mild PH (RVSP 40), mild aortic insuf, mild mitral insuf  Hypertension  at baseline, now with intermittent hypotension.  Pressors started 5/25>>off 5/25. Plan: -continue IV fluids until able to eat -keep CVL in for now>>likely change to peripheral IV's soon  Chronic diastolic dysfunction Plan: -monitor blood pressure  RENAL  Foley:  5/23 >>  Acute renal failure due to sepsis, hypotension. Plan: -continue IV fluid -monitor renal fx, urine outpt -keep foley in for now  Metabolic acidosis>>resolved.   GASTROINTESTINAL  Peritonitis s/p duodenal perforation- OR 5/24 Plan: -post op care per surgery -advance nutrition per surgery  HEMATOLOGIC  Anemia of critical illness. Plan: -f/u CBC -transfuse for Hb <  7  INFECTIOUS  Cultures: 5/23 blood x2 >> 5/23 urine >>negative 5/24 Peritoneal fluid>>  Antibiotics: 5/23 Zosyn (peritonitis) >> 5/24 Diflucan (peritonitis) >>  Peritonitis  Plan: -D4/x zosyn, diflucan   ENDOCRINE  Lab 03/27/12 0337 03/27/12 03/26/12 2014 03/26/12 1940 03/26/12 1504  GLUCAP 138* 123* 115* 49* 78   DM 2   Plan: -SSI -?if she needs TNA if not able to have enteral nutrition soon>>defer to surgery  Hypothyroidism Plan: -IV synthroid -convert to pill when able to take po  NEUROLOGIC  Acute metabolic encephalopathy, due to severe sepsis.  Mental status improved 5/25. Plan: -monitor clinically -prn fentanyl for pain control   BEST PRACTICE / DISPOSITION - Level of Care:  Med surg - Primary Service:  FPTS - Consultants:  Gen Surgery - Code Status:  Full - Diet:  npo - DVT Px:  SQ heparin - GI Px: Protonix  Transfer to Med-Surg.  Will ask [facility] to assume care 5/27 and PCCM sign off.  Coralyn Helling, MD 03/27/2012, 9:03 AM Pager:  [phone]

## 2012-03-27 NOTE — Progress Notes (Signed)
RN attempted to reinsert NG tube at the request of physician. Patient very resistant, thrushing her tongue back, non compliant and adamantly refused to have NG tube replaced. Md notified. Also made aware of lab results of peritoneal fluid sent, which grew gram negative rods, yeast and strep per lab.

## 2012-03-27 NOTE — Progress Notes (Signed)
Patient assisted to bedside commode, while on bsc, patient pulled out her NG tube. Nurse tech was still present in room at the time. Md paged.

## 2012-03-27 NOTE — Progress Notes (Signed)
3 Days Post-Op  Subjective: Nurse reports a small amount of mucus/ small bowel movement yesterday.  70 cc of NG output over the last 12 hours.  Objective: Vital signs in last 24 hours: Temp:  [97.5 F (36.4 C)-99.6 F (37.6 C)] 99.1 F (37.3 C) (05/26 0600) Pulse Rate:  [55-95] 77  (05/26 0600) Resp:  [14-30] 25  (05/26 0600) BP: (93-118)/(45-87) 97/45 mmHg (05/26 0400) SpO2:  [55 %-100 %] 100 % (05/26 0600) Arterial Line BP: (88-177)/(32-68) 126/52 mmHg (05/26 0600) FiO2 (%):  [40 %] 40 % (05/25 0753) Weight:  [156 lb 8.4 oz (71 kg)] 156 lb 8.4 oz (71 kg) (05/26 0400) Last BM Date: 03/26/12  Intake/Output from previous day: 05/25 0701 - 05/26 0700 In: 1858.3 [I.V.:1368.3; NG/GT:70; IV Piggyback:420] Out: 1510 [Urine:1020; Emesis/NG output:250; Drains:240] Intake/Output this shift: Total I/O In: 1238.3 [I.V.:958.3; NG/GT:70; IV Piggyback:210] Out: 725 [Urine:535; Emesis/NG output:100; Drains:90]  General appearance: alert, cooperative and no distress GI: soft, minimal hypoactive bowel sounds port sites dry; drains with serous output  Lab Results:   Basename 03/27/12 0410 03/26/12 0430  WBC 14.2* 14.3*  HGB 9.2* 10.3*  HCT 27.6* 30.6*  PLT 149* 166   BMET  Basename 03/27/12 0410 03/26/12 0430  NA 135 138  K 3.2* 3.4*  CL 106 107  CO2 22 20  GLUCOSE 126* 152*  BUN 22 37*  CREATININE 0.95 1.68*  CALCIUM 7.9* 7.7*   PT/INR  Basename 03/24/12 2200  LABPROT 17.9*  INR 1.45   ABG  Basename 03/26/12 0502 03/25/12 0430  PHART 7.524* 7.335*  HCO3 21.4 16.6*    Studies/Results: Dg Chest Port 1 View  03/26/2012  *RADIOLOGY REPORT*  Clinical Data: Endotracheal tube placement  PORTABLE CHEST - 1 VIEW  Comparison: Yesterday  Findings: Normal heart size.  Endotracheal tube, NG tube, right internal jugular vein center venous catheter are stable. Increasing haziness at both lung bases likely a combination of pleural fluid and volume loss.  IMPRESSION: Increasing  haziness at both lung bases as described.  Original Report Authenticated By: Donavan Burnet, M.D.    Anti-infectives: Anti-infectives     Start     Dose/Rate Route Frequency Ordered Stop   03/26/12 1400   piperacillin-tazobactam (ZOSYN) IVPB 3.375 g        3.375 g 12.5 mL/hr over 240 Minutes Intravenous 3 times per day 03/26/12 1252     03/26/12 0600   fluconazole (DIFLUCAN) IVPB 200 mg        200 mg 100 mL/hr over 60 Minutes Intravenous Every 24 hours 03/25/12 0406     03/25/12 0600   piperacillin-tazobactam (ZOSYN) IVPB 2.25 g  Status:  Discontinued        2.25 g 100 mL/hr over 30 Minutes Intravenous 4 times per day 03/25/12 0406 03/26/12 1252   03/25/12 0415   piperacillin-tazobactam (ZOSYN) IVPB 3.375 g  Status:  Discontinued        3.375 g 100 mL/hr over 30 Minutes Intravenous  Once 03/25/12 0406 03/25/12 0452   03/25/12 0145   gentamycin 80 mg in 0.9% normal saline 250 mL irrigation  Status:  Discontinued        80 mg Irrigation Every 5 min 03/25/12 0122 03/25/12 0406   03/25/12 0145   clindamycin (CLEOCIN) IVPB 900 mg        900 mg 100 mL/hr over 30 Minutes Intravenous  Once 03/25/12 0142 03/25/12 0652   03/25/12 0130   clindamycin (CLEOCIN) injection 900 mg  Status:  Discontinued        900 mg Intravenous  Once 03/25/12 0126 03/25/12 0141   03/25/12 0100   fluconazole (DIFLUCAN) IVPB 200 mg        200 mg 100 mL/hr over 60 Minutes Intravenous  Once 03/25/12 0051 03/25/12 0735   03/24/12 2345   piperacillin-tazobactam (ZOSYN) IVPB 3.375 g        3.375 g 100 mL/hr over 30 Minutes Intravenous  Once 03/24/12 2341 03/25/12 0015          Assessment/Plan: s/p Procedure(s) (LRB): LAPAROSCOPY DIAGNOSTIC (N/A) REPAIR OF PERFORATED ULCER (N/A) Awaiting resolution of post-op ileus - minimal bowel sounds Will plan upper GI tomorrow to evaluate repair, then may be ready to d/c NG tube and start liquids Ice chips only for now.  LOS: 3 days    Megan [name]  K. 03/27/2012

## 2012-03-28 ENCOUNTER — Encounter (HOSPITAL_COMMUNITY): Payer: Self-pay | Admitting: General Practice

## 2012-03-28 ENCOUNTER — Inpatient Hospital Stay (HOSPITAL_COMMUNITY): Payer: Medicare Other

## 2012-03-28 DIAGNOSIS — K250 Acute gastric ulcer with hemorrhage: Secondary | ICD-10-CM

## 2012-03-28 DIAGNOSIS — K650 Generalized (acute) peritonitis: Secondary | ICD-10-CM

## 2012-03-28 DIAGNOSIS — N170 Acute kidney failure with tubular necrosis: Secondary | ICD-10-CM

## 2012-03-28 DIAGNOSIS — I959 Hypotension, unspecified: Secondary | ICD-10-CM

## 2012-03-28 LAB — CBC
MCH: 28.1 pg (ref 26.0–34.0)
Platelets: 160 10*3/uL (ref 150–400)
RBC: 3.49 MIL/uL — ABNORMAL LOW (ref 3.87–5.11)
WBC: 9.6 10*3/uL (ref 4.0–10.5)

## 2012-03-28 LAB — BASIC METABOLIC PANEL
Calcium: 8.1 mg/dL — ABNORMAL LOW (ref 8.4–10.5)
GFR calc Af Amer: 72 mL/min — ABNORMAL LOW (ref >90)
GFR calc non Af Amer: 62 mL/min — ABNORMAL LOW (ref >90)
Glucose, Bld: 123 mg/dL — ABNORMAL HIGH (ref 70–99)
Sodium: 137 mEq/L (ref 135–145)

## 2012-03-28 LAB — GLUCOSE, CAPILLARY
Glucose-Capillary: 131 mg/dL — ABNORMAL HIGH (ref 70–99)
Glucose-Capillary: 81 mg/dL (ref 70–99)

## 2012-03-28 LAB — MAGNESIUM: Magnesium: 1.7 mg/dL (ref 1.5–2.5)

## 2012-03-28 MED ORDER — IOHEXOL 300 MG/ML  SOLN
150.0000 mL | Freq: Once | INTRAMUSCULAR | Status: AC | PRN
  Administered 2012-03-28: 150 mL via ORAL

## 2012-03-28 MED ORDER — KCL IN DEXTROSE-NACL 30-5-0.45 MEQ/L-%-% IV SOLN
INTRAVENOUS | Status: DC
  Administered 2012-03-28: 19:00:00 via INTRAVENOUS
  Administered 2012-03-29: 50 mL/h via INTRAVENOUS
  Administered 2012-03-31: 06:00:00 via INTRAVENOUS
  Filled 2012-03-28 (×7): qty 1000

## 2012-03-28 MED ORDER — POTASSIUM CHLORIDE 10 MEQ/100ML IV SOLN
10.0000 meq | INTRAVENOUS | Status: AC
  Administered 2012-03-28 (×4): 10 meq via INTRAVENOUS
  Filled 2012-03-28 (×4): qty 100

## 2012-03-28 MED ORDER — MAGNESIUM SULFATE 40 MG/ML IJ SOLN
2.0000 g | Freq: Once | INTRAMUSCULAR | Status: AC
  Administered 2012-03-28: 2 g via INTRAVENOUS
  Filled 2012-03-28: qty 50

## 2012-03-28 NOTE — Progress Notes (Signed)
4 Days Post-Op  Subjective: She pulled the ngt out.  She denies n/v.  Had a BM yesterday  Objective: Vital signs in last 24 hours: Temp:  [98.3 F (36.8 C)-99.7 F (37.6 C)] 98.4 F (36.9 C) (05/27 0438) Pulse Rate:  [37-92] 77  (05/27 0438) Resp:  [16-24] 18  (05/27 0438) BP: (117-155)/(57-89) 150/75 mmHg (05/27 0438) SpO2:  [95 %-100 %] 96 % (05/27 0438) Arterial Line BP: (114-142)/(43-58) 133/51 mmHg (05/26 1100) Last BM Date: 03/27/12  Intake/Output from previous day: 05/26 0701 - 05/27 0700 In: 1440 [I.V.:1200; NG/GT:30; IV Piggyback:210] Out: 1245 [Urine:1160; Drains:85] Intake/Output this shift: Total I/O In: 1201.7 [I.V.:1201.7] Out: -   PE: Abd-soft, incisions clean, serous drain output  Lab Results:   Basename 03/28/12 0610 03/27/12 0410  WBC 9.6 14.2*  HGB 9.8* 9.2*  HCT 29.4* 27.6*  PLT 160 149*   BMET  Basename 03/28/12 0610 03/27/12 0410  NA 137 135  K 3.1* 3.2*  CL 107 106  CO2 22 22  GLUCOSE 123* 126*  BUN 13 22  CREATININE 0.85 0.95  CALCIUM 8.1* 7.9*   PT/INR No results found for this basename: LABPROT:2,INR:2 in the last 72 hours Comprehensive Metabolic Panel:    Component Value Date/Time   NA 137 03/28/2012 0610   K 3.1* 03/28/2012 0610   CL 107 03/28/2012 0610   CO2 22 03/28/2012 0610   BUN 13 03/28/2012 0610   CREATININE 0.85 03/28/2012 0610   GLUCOSE 123* 03/28/2012 0610   CALCIUM 8.1* 03/28/2012 0610   AST 24 03/24/2012 2200   ALT 7 03/24/2012 2200   ALKPHOS 39 03/24/2012 2200   BILITOT 1.0 03/24/2012 2200   PROT 5.5* 03/24/2012 2200   ALBUMIN 2.3* 03/24/2012 2200     Studies/Results: Dg Chest Port 1 View  03/27/2012  *RADIOLOGY REPORT*  Clinical Data: Follow up respiratory failure  PORTABLE CHEST - 1 VIEW  Comparison: 03/26/2012  Findings: Cardiomegaly.  Mild patchy right perihilar opacity, possibly atelectasis or asymmetric edema.  Early/developing pneumonia not excluded.  Patchy bibasilar opacities, likely atelectasis, with  suspected small left pleural effusion.  No pneumothorax.  Enteric tube coursing below the diaphragm.  Stable right IJ venous catheter.  Subphrenic drain on the right.  IMPRESSION: Mild patchy right perihilar opacity, possibly atelectasis or asymmetric edema. Early pneumonia not excluded.  Bibasilar atelectasis with suspected small left pleural effusion.  Cardiomegaly.  Original Report Authenticated By: Charline Bills, M.D.    Anti-infectives: Anti-infectives     Start     Dose/Rate Route Frequency Ordered Stop   03/27/12 1530   fluconazole (DIFLUCAN) IVPB 200 mg  Status:  Discontinued        200 mg 100 mL/hr over 60 Minutes Intravenous Every 24 hours 03/27/12 1527 03/27/12 1534   03/26/12 1400  piperacillin-tazobactam (ZOSYN) IVPB 3.375 g       3.375 g 12.5 mL/hr over 240 Minutes Intravenous 3 times per day 03/26/12 1252     03/26/12 0600   fluconazole (DIFLUCAN) IVPB 200 mg        200 mg 100 mL/hr over 60 Minutes Intravenous Every 24 hours 03/25/12 0406     03/25/12 0600   piperacillin-tazobactam (ZOSYN) IVPB 2.25 g  Status:  Discontinued        2.25 g 100 mL/hr over 30 Minutes Intravenous 4 times per day 03/25/12 0406 03/26/12 1252   03/25/12 0415   piperacillin-tazobactam (ZOSYN) IVPB 3.375 g  Status:  Discontinued        3.375  g 100 mL/hr over 30 Minutes Intravenous  Once 03/25/12 0406 03/25/12 0452   03/25/12 0145   gentamycin 80 mg in 0.9% normal saline 250 mL irrigation  Status:  Discontinued        80 mg Irrigation Every 5 min 03/25/12 0122 03/25/12 0406   03/25/12 0145   clindamycin (CLEOCIN) IVPB 900 mg        900 mg 100 mL/hr over 30 Minutes Intravenous  Once 03/25/12 0142 03/25/12 0652   03/25/12 0130   clindamycin (CLEOCIN) injection 900 mg  Status:  Discontinued        900 mg Intravenous  Once 03/25/12 0126 03/25/12 0141   03/25/12 0100   fluconazole (DIFLUCAN) IVPB 200 mg        200 mg 100 mL/hr over 60 Minutes Intravenous  Once 03/25/12 0051 03/25/12 0735    03/24/12 2345  piperacillin-tazobactam (ZOSYN) IVPB 3.375 g       3.375 g 100 mL/hr over 30 Minutes Intravenous  Once 03/24/12 2341 03/25/12 0015          Assessment Principal Problem:  Perforated pyloric ulcer s/p laparoscopic repair 03/25/12-making satisfactory progress    LOS: 4 days   Plan: UGI today.  Continue IV abxs.   Adolph Pollack 03/28/2012

## 2012-03-28 NOTE — Progress Notes (Signed)
03/28/2012 11:45 AM Late Entry from around 0730 this morning. Pt still adamantly refusing NGT.  Dr. Abbey Chatters aware.  No further orders at this time concerning the NGT.  Will continue to monitor patient. [name]

## 2012-03-28 NOTE — Progress Notes (Signed)
[facility] [place] TEAM 1 - Stepdown/ICU TEAM  PCP:  Pola Corn, MD, MD  Subjective: 78 y/o female with DM and HTN presented to the [facility] ED on 5/23 with abdominal pain for three days and was found to have free air on a KUB. She stated that she had been constipated for several days prior to admission. Her last bowel movement was on 5/21 which was associated with a lot of straining and one episode of nausea and vomiting. She was noted to be confused by her family on the day of admission and so they brought her in to the ED. Pt was admitted by the [facility] service, with Gen Surg following in consultation.  In that she has stabilized and has been transferred out of the ICU, [facility] is assuming care of this patient as of today.    The pt is in good spirits today.  She reports that she is hungry.  She denies abdom pain, sob, cp, f/c, or n/v.    Events Since Admission:  5/23 CT head >>> no acute findings  5/24 CT Abdomen >> Pneumoperitoneum with ascites, diffuse colonic diverticulosis, peritoneal carcinomatosis suspected (but no evidence of carcinomatosis - no evidence of metastatic disease at time of surgery) 5/24 OR for duodenal perforation 5/24>>5/25 intubated (peri-op)  Lines:  5/24 CVL >>>  Objective:  Intake/Output Summary (Last 24 hours) at 03/28/12 1728 Last data filed at 03/28/12 1726  Gross per 24 hour  Intake 3434.59 ml  Output   1650 ml  Net 1784.59 ml   Blood pressure 167/79, pulse 80, temperature 98.5 F (36.9 C), temperature source Oral, resp. rate 20, height 5\' 8"  (1.727 m), weight 71 kg (156 lb 8.4 oz), SpO2 98.00%.  CBG (last 3)   Basename 03/28/12 1618 03/28/12 1142 03/28/12 0753  GLUCAP 141* 131* 110*   Physical Exam: General: No acute respiratory distress Lungs: Clear to auscultation bilaterally without wheezes or crackles Cardiovascular: Regular rate and rhythm without murmur gallop or rub normal S1 and S2 Abdomen: Nontender, nondistended, soft, bowel  sounds positive, no rebound, no ascites, no appreciable mass - lap wounds dressed and dry Extremities: No significant cyanosis, clubbing, or edema bilateral lower extremities  Lab Results:  Basename 03/28/12 0610 03/27/12 0410 03/26/12 0430  NA 137 135 138  K 3.1* 3.2* 3.4*  CL 107 106 107  CO2 22 22 20   GLUCOSE 123* 126* 152*  BUN 13 22 37*  CREATININE 0.85 0.95 1.68*  CALCIUM 8.1* 7.9* 7.7*  MG 1.7 1.8 --  PHOS 1.7* 1.4* --    Basename 03/28/12 0610 03/27/12 0410 03/26/12 0430  WBC 9.6 14.2* 14.3*  NEUTROABS -- -- --  HGB 9.8* 9.2* 10.3*  HCT 29.4* 27.6* 30.6*  MCV 84.2 84.7 84.5  PLT 160 149* 166   Studies/Results: All recent x-ray/radiology reports have been reviewed in detail.   Medications: I have reviewed the patient's complete medication list.  Assessment/Plan:  Peritonitis s/p perforated pyloric ulcer - OR 5/24 for laparoscopic repair w/ omental patch Pt pulled out her NG - Gen Surg has ordered UGI for today - diet being advanced   Acute renal failure Due to sepsis/hypotension - resolved  Lactic Acidosis Due to above - resolved  Hypokalemia Replace with goal of 4.0 or > - Mg is borderline so will also supplement Mg  Anemia of critical illness Hgb beginning to improve  HTN Uncontrolled - adjust tx and follow trend  DM Reasonably controlled at the present time  Diastolic CHF via  echo 2011 No clinical evidence of volume overload  hypothyroidism Remains on synthroid  Dispo Mobilize - PT/OT - pt is highly motivated to d/c home asap  Lonia Blood, MD [facility] Office  [phone] Pager [phone]  On-Call/Text Page:      [email or web address]      password [facility]

## 2012-03-29 DIAGNOSIS — N170 Acute kidney failure with tubular necrosis: Secondary | ICD-10-CM

## 2012-03-29 DIAGNOSIS — K650 Generalized (acute) peritonitis: Secondary | ICD-10-CM

## 2012-03-29 DIAGNOSIS — K250 Acute gastric ulcer with hemorrhage: Secondary | ICD-10-CM

## 2012-03-29 DIAGNOSIS — I959 Hypotension, unspecified: Secondary | ICD-10-CM

## 2012-03-29 LAB — BASIC METABOLIC PANEL
BUN: 8 mg/dL (ref 6–23)
CO2: 23 mEq/L (ref 19–32)
Calcium: 8.2 mg/dL — ABNORMAL LOW (ref 8.4–10.5)
Creatinine, Ser: 0.77 mg/dL (ref 0.50–1.10)

## 2012-03-29 LAB — GLUCOSE, CAPILLARY
Glucose-Capillary: 181 mg/dL — ABNORMAL HIGH (ref 70–99)
Glucose-Capillary: 125 mg/dL — ABNORMAL HIGH (ref 70–99)
Glucose-Capillary: 167 mg/dL — ABNORMAL HIGH (ref 70–99)

## 2012-03-29 LAB — CBC
HCT: 29.5 % — ABNORMAL LOW (ref 36.0–46.0)
MCH: 28.1 pg (ref 26.0–34.0)
MCHC: 33.6 g/dL (ref 30.0–36.0)
RDW: 14.3 % (ref 11.5–15.5)

## 2012-03-29 LAB — MAGNESIUM: Magnesium: 2.1 mg/dL (ref 1.5–2.5)

## 2012-03-29 LAB — ANAEROBIC CULTURE

## 2012-03-29 MED ORDER — FLUCONAZOLE IN SODIUM CHLORIDE 400-0.9 MG/200ML-% IV SOLN
400.0000 mg | INTRAVENOUS | Status: DC
  Administered 2012-03-30 – 2012-03-31 (×2): 400 mg via INTRAVENOUS
  Filled 2012-03-29 (×3): qty 200

## 2012-03-29 MED ORDER — ENSURE COMPLETE PO LIQD
237.0000 mL | ORAL | Status: DC
  Administered 2012-03-29 – 2012-04-01 (×3): 237 mL via ORAL

## 2012-03-29 NOTE — Progress Notes (Signed)
a  CARE MANAGEMENT NOTE 03/29/2012  Patient:  Megan [name], Megan [name]   Account Number:  [id number]  Date Initiated:  03/25/2012  Documentation initiated by:  [facility]  Subjective/Objective Assessment:   Perferated bowel post surgical intervention unable to extubate  Lives with husband who is amputee and she is caregiver. Also has daughter and grandaughter.     Action/Plan:   PT eval recommending HHPT.  Met with pt and daughter in law re HH needs, will need walker and 3:1 commode, children will assist at home as husband is bil foot amputee.   Anticipated DC Date:  03/30/2012   Anticipated DC Plan:  HOME W HOME HEALTH SERVICES      DC Planning Services  CM consult      PAC Choice  DURABLE MEDICAL EQUIPMENT  HOME HEALTH   Choice offered to / List presented to:  C-1 Patient   DME arranged  3-N-1  Levan Hurst      DME agency  [facility]     [facility] arranged  HH-1 RN  HH-2 PT      [facility] agency  [facility]   Status of service:  Completed, signed off Medicare Important Message given?   (If response is "NO", the following Medicare IM given date fields will be blank) Date Medicare IM given:   Date Additional Medicare IM given:    Discharge Disposition:  HOME W HOME HEALTH SERVICES  Per UR Regulation:  Reviewed for med. necessity/level of care/duration of stay  If discussed at Long Length of Stay Meetings, dates discussed:    Comments:

## 2012-03-29 NOTE — Progress Notes (Signed)
ANTIBIOTIC CONSULT NOTE - FOLLOW UP  Pharmacy Consult for Diflucan/Zosyn Indication: peritonitis  No Known Allergies  Patient Measurements: Height: 5\' 8"  (172.7 cm) Weight: 157 lb 13.6 oz (71.6 kg) IBW/kg (Calculated) : 63.9   Vital Signs: Temp: 97.8 F (36.6 C) (05/28 0951) Temp src: Oral (05/28 0951) BP: 151/60 mmHg (05/28 0951) Pulse Rate: 66  (05/28 0951) Intake/Output from previous day: 05/27 0701 - 05/28 0700 In: 2914.6 [P.O.:720; I.V.:2134.6; IV Piggyback:60] Out: 1885 [Urine:1700; Drains:185] Intake/Output from this shift:    Labs:  Basename 03/29/12 0518 03/28/12 0610 03/27/12 0410  WBC 8.3 9.6 14.2*  HGB 9.9* 9.8* 9.2*  PLT 147* 160 149*  LABCREA -- -- --  CREATININE 0.77 0.85 0.95   Estimated Creatinine Clearance: 54.7 ml/min (by C-G formula based on Cr of 0.77).   Microbiology: Recent Results (from the past 720 hour(s))  URINE CULTURE     Status: Normal   Collection Time   03/24/12  9:55 PM      Component Value Range Status Comment   Specimen Description URINE, CATHETERIZED   Final    Special Requests Normal   Final    Culture  Setup Time [id number]   Final    Colony Count NO GROWTH   Final    Culture NO GROWTH   Final    Report Status 03/25/2012 FINAL   Final   CULTURE, BLOOD (ROUTINE X 2)     Status: Normal (Preliminary result)   Collection Time   03/24/12 10:00 PM      Component Value Range Status Comment   Specimen Description BLOOD HAND RIGHT   Final    Special Requests BOTTLES DRAWN AEROBIC ONLY 4CC   Final    Culture  Setup Time [id number]   Final    Culture     Final    Value:        BLOOD CULTURE RECEIVED NO GROWTH TO DATE CULTURE WILL BE HELD FOR 5 DAYS BEFORE ISSUING A FINAL NEGATIVE REPORT   Report Status PENDING   Incomplete   CULTURE, BLOOD (ROUTINE X 2)     Status: Normal (Preliminary result)   Collection Time   03/24/12 10:30 PM      Component Value Range Status Comment   Specimen Description BLOOD ARM RIGHT   Final    Special Requests BOTTLES DRAWN AEROBIC ONLY 1CC   Final    Culture  Setup Time [id number]   Final    Culture     Final    Value:        BLOOD CULTURE RECEIVED NO GROWTH TO DATE CULTURE WILL BE HELD FOR 5 DAYS BEFORE ISSUING A FINAL NEGATIVE REPORT   Report Status PENDING   Incomplete   ANAEROBIC CULTURE     Status: Normal (Preliminary result)   Collection Time   03/25/12  3:32 AM      Component Value Range Status Comment   Specimen Description FLUID PERITONEAL   Final    Special Requests NONE ON SWAB PATIENT ON FOLLOWING ZOSYN   Final    Gram Stain PENDING   Incomplete    Culture     Final    Value: NO ANAEROBES ISOLATED; CULTURE IN PROGRESS FOR 5 DAYS   Report Status PENDING   Incomplete   BODY FLUID CULTURE     Status: Normal (Preliminary result)   Collection Time   03/25/12  3:32 AM      Component Value Range Status Comment   Specimen Description FLUID  PERITONEAL   Final    Special Requests NONE ON SWAB PATIENT ON FOLLOWING ZOSYN   Final    Gram Stain     Final    Value: FEW WBC PRESENT,BOTH PMN AND MONONUCLEAR     NO ORGANISMS SEEN   Culture     Final    Value: FEW YEAST     RARE VIRIDANS STREPTOCOCCUS     FEW HAEMOPHILUS PARAINFLUENZAE     Note: BETA LACTAMASE NEGATIVE     Note: CRITICAL RESULT CALLED TO, READ BACK BY AND VERIFIED WITH: RN A. MINTZ ON 03/27/12 BY DTERRY   Report Status PENDING   Incomplete   MRSA PCR SCREENING     Status: Normal   Collection Time   03/25/12  4:19 AM      Component Value Range Status Comment   MRSA by PCR NEGATIVE  NEGATIVE  Final     Anti-infectives     Start     Dose/Rate Route Frequency Ordered Stop   03/27/12 1530   fluconazole (DIFLUCAN) IVPB 200 mg  Status:  Discontinued        200 mg 100 mL/hr over 60 Minutes Intravenous Every 24 hours 03/27/12 1527 03/27/12 1534   03/26/12 1400   piperacillin-tazobactam (ZOSYN) IVPB 3.375 g        3.375 g 12.5 mL/hr over 240 Minutes Intravenous 3 times per day 03/26/12 1252     03/26/12 0600    fluconazole (DIFLUCAN) IVPB 200 mg        200 mg 100 mL/hr over 60 Minutes Intravenous Every 24 hours 03/25/12 0406     03/25/12 0600   piperacillin-tazobactam (ZOSYN) IVPB 2.25 g  Status:  Discontinued        2.25 g 100 mL/hr over 30 Minutes Intravenous 4 times per day 03/25/12 0406 03/26/12 1252   03/25/12 0415   piperacillin-tazobactam (ZOSYN) IVPB 3.375 g  Status:  Discontinued        3.375 g 100 mL/hr over 30 Minutes Intravenous  Once 03/25/12 0406 03/25/12 0452   03/25/12 0145   gentamycin 80 mg in 0.9% normal saline 250 mL irrigation  Status:  Discontinued        80 mg Irrigation Every 5 min 03/25/12 0122 03/25/12 0406   03/25/12 0145   clindamycin (CLEOCIN) IVPB 900 mg        900 mg 100 mL/hr over 30 Minutes Intravenous  Once 03/25/12 0142 03/25/12 0652   03/25/12 0130   clindamycin (CLEOCIN) injection 900 mg  Status:  Discontinued        900 mg Intravenous  Once 03/25/12 0126 03/25/12 0141   03/25/12 0100   fluconazole (DIFLUCAN) IVPB 200 mg        200 mg 100 mL/hr over 60 Minutes Intravenous  Once 03/25/12 0051 03/25/12 0735   03/24/12 2345   piperacillin-tazobactam (ZOSYN) IVPB 3.375 g        3.375 g 100 mL/hr over 30 Minutes Intravenous  Once 03/24/12 2341 03/25/12 0015          Assessment: 78 yo F POD#5 repair of duodenal perforation on day 5 Diflucan and Zosyn.  Renal function continues to improve (est crcl ~54 ml/min).  Cultures: 5/24 peritoneal fluid:  Yeast, viridans stretococcus, few haemophilus parainfluenzae 5/23 blood x 2: NGTD 5/23 urine: Neg  Plan:  Continue Zosyn to 3.375 gm IV q8hr, each dose over 4 hours Increase Diflucan to 400 mg IV q24hr for peritonitis. F/up cultures, renal function  Noah Delaine,  RPh Clinical Pharmacist 03/29/2012,11:48 AM

## 2012-03-29 NOTE — Evaluation (Signed)
Occupational Therapy Evaluation Patient Details Name: Megan [name] MRN: [id number] DOB: 1927/04/27 Today's Date: 03/29/2012 Time: 9528-4132 OT Time Calculation (min): 24 min  OT Assessment / Plan / Recommendation Clinical Impression  Pt. presents with with pyloric perforated ulceration and with overall deconditioning. Pt. will benefit from skilled OT to increase functional independence to supervision level at D/C home.    OT Assessment  Patient needs continued OT Services    Follow Up Recommendations  Home health OT;Supervision - Intermittent    Barriers to Discharge None    Equipment Recommendations  None recommended by OT       Frequency  Min 2X/week    Precautions / Restrictions Precautions Precautions: Fall Restrictions Weight Bearing Restrictions: No       ADL  Eating/Feeding: Simulated;Set up Where Assessed - Eating/Feeding: Chair Grooming: Performed;Wash/dry hands;Set up;Min guard Where Assessed - Grooming: Unsupported standing Upper Body Bathing: Simulated;Set up Where Assessed - Upper Body Bathing: Supported sitting Lower Body Bathing: Simulated;Minimal assistance Where Assessed - Lower Body Bathing: Unsupported sit to stand Upper Body Dressing: Minimal assistance;Performed (don gown) Where Assessed - Upper Body Dressing: Supported sitting Lower Body Dressing: Simulated;Minimal assistance Where Assessed - Lower Body Dressing: Unsupported sit to stand Toilet Transfer: Performed;Minimal assistance Toilet Transfer Method: Sit to stand (with RW) Acupuncturist: Bedside commode Toileting - Clothing Manipulation and Hygiene: Performed;Moderate assistance Where Assessed - Toileting Clothing Manipulation and Hygiene: Sit to stand from 3-in-1 or toilet Tub/Shower Transfer Method: Not assessed Equipment Used: Rolling walker Transfers/Ambulation Related to ADLs: Pt. min guard assist ~50' with RW ADL Comments: Pt. with decreased activity tolerance overall  during ADL tasks and intermittently SOB requiring short rest break during ADL tasks.     OT Diagnosis: Generalized weakness  OT Problem List: Decreased strength;Decreased activity tolerance;Impaired balance (sitting and/or standing);Decreased safety awareness;Decreased knowledge of use of DME or AE OT Treatment Interventions: Self-care/ADL training;DME and/or AE instruction;Therapeutic activities;Patient/family education;Balance training   OT Goals Acute Rehab OT Goals OT Goal Formulation: With patient Time For Goal Achievement: 04/05/12 Potential to Achieve Goals: Good ADL Goals Pt Will Perform Grooming: with set-up;with supervision;Standing at sink ADL Goal: Grooming - Progress: Goal set today Pt Will Perform Upper Body Dressing: with set-up;with supervision;Sitting, bed ADL Goal: Upper Body Dressing - Progress: Goal set today Pt Will Perform Lower Body Dressing: with set-up;with supervision;Sit to stand from bed ADL Goal: Lower Body Dressing - Progress: Goal set today Pt Will Transfer to Toilet: with supervision;Ambulation;with DME;3-in-1 ADL Goal: Toilet Transfer - Progress: Goal set today Pt Will Perform Toileting - Hygiene: with set-up;with supervision;Sit to stand from 3-in-1/toilet ADL Goal: Toileting - Hygiene - Progress: Goal set today Additional ADL Goal #1: Pt. will use 2 energy conservation techniques with an ADL task. ADL Goal: Additional Goal #1 - Progress: Goal set today  Visit Information  Last OT Received On: 03/29/12 Assistance Needed: +1 PT/OT Co-Evaluation/Treatment: Yes    Subjective Data  Subjective: "I need to use the bathroom" Patient Stated Goal: "Go home"   Prior Functioning  Home Living Lives With: Spouse Available Help at Discharge: Family Type of Home: House Home Access: Level entry Home Layout: One level Bathroom Shower/Tub: Engineer, manufacturing systems: Standard Home Adaptive Equipment: Walker - rolling;Straight cane;Shower chair with  back;Walker - four wheeled Prior Function Level of Independence: Independent Able to Take Stairs?: Yes Driving: Yes Comments: ? the accuracy of home setup Communication Communication: No difficulties    Cognition  Overall Cognitive Status: Appears within functional limits for  tasks assessed/performed Arousal/Alertness: Awake/alert Orientation Level: Oriented X4 / Intact Behavior During Session: WFL for tasks performed    Extremity/Trunk Assessment Right Upper Extremity Assessment RUE ROM/Strength/Tone: Within functional levels RUE Sensation: WFL - Light Touch RUE Coordination: WFL - gross/fine motor Left Upper Extremity Assessment LUE ROM/Strength/Tone: Within functional levels LUE Sensation: WFL - Light Touch LUE Coordination: WFL - gross/fine motor Right Lower Extremity Assessment RLE ROM/Strength/Tone: Within functional levels Left Lower Extremity Assessment LLE ROM/Strength/Tone: Within functional levels (generally weak Bil) Trunk Assessment Trunk Assessment: Normal   Mobility Bed Mobility Bed Mobility: Not assessed Transfers Transfers: Sit to Stand;Stand to Sit Sit to Stand: 4: Min guard Stand to Sit: 4: Min guard Details for Transfer Assistance: vc's for hand placement and need to back up further before sitting       Balance Balance Balance Assessed: Yes Static Sitting Balance Static Sitting - Balance Support: No upper extremity supported;Feet supported Static Sitting - Level of Assistance: 5: Stand by assistance Static Sitting - Comment/# of Minutes: 10  End of Session OT - End of Session Equipment Utilized During Treatment: Gait belt Activity Tolerance: Patient tolerated treatment well Patient left: in chair;with call bell/phone within reach Nurse Communication: Mobility status   [name], OTR/L Pager [phone] 03/29/2012, 1:34 PM

## 2012-03-29 NOTE — Evaluation (Signed)
Physical Therapy Evaluation Patient Details Name: Megan [name] MRN: [id number] DOB: Feb 15, 1927 Today's Date: 03/29/2012 Time: 6213-0865 PT Time Calculation (min): 27 min  PT Assessment / Plan / Recommendation Clinical Impression  pt adm with pyloric perforated ulceration.  Now much improved, but mildly weak and deconditioned.  Rec HHPT after D/C    PT Assessment  Patient needs continued PT services    Follow Up Recommendations  Home health PT    Barriers to Discharge        lEquipment Recommendations  None recommended by PT    Recommendations for Other Services     Frequency Min 3X/week    Precautions / Restrictions Precautions Precautions: Fall Restrictions Weight Bearing Restrictions: No   Pertinent Vitals/Pain       Mobility  Bed Mobility Bed Mobility: Not assessed Transfers Transfers: Sit to Stand;Stand to Sit Sit to Stand: 4: Min guard Stand to Sit: 4: Min guard Details for Transfer Assistance: vc's for hand placement and need to back up further before sitting  Ambulation/Gait Ambulation/Gait Assistance: 4: Min guard Ambulation Distance (Feet): 140 Feet Assistive device: Rolling walker Ambulation/Gait Assistance Details: generally steady with RW, but slow and stiff.  needed vc's for sequencing and safest use of RW initially Gait Pattern: Step-through pattern;Decreased step length - right;Decreased step length - left;Decreased stride length Stairs: No Wheelchair Mobility Wheelchair Mobility: No    Exercises     PT Diagnosis: Generalized weakness  PT Problem List: Decreased strength;Decreased activity tolerance;Decreased balance;Decreased knowledge of use of DME;Decreased mobility PT Treatment Interventions: DME instruction;Gait training;Stair training;Functional mobility training;Therapeutic activities;Balance training;Patient/family education   PT Goals Acute Rehab PT Goals PT Goal Formulation: With patient Time For Goal Achievement:  03/29/12 Potential to Achieve Goals: Good Pt will go Supine/Side to Sit: Independently PT Goal: Supine/Side to Sit - Progress: Goal set today Pt will go Sit to Stand: Independently PT Goal: Sit to Stand - Progress: Goal set today Pt will Ambulate: >150 feet;with modified independence;with least restrictive assistive device PT Goal: Ambulate - Progress: Goal set today  Visit Information  Last PT Received On: 03/29/12 Assistance Needed: +1 PT/OT Co-Evaluation/Treatment: Yes    Subjective Data  Subjective: I can do things for myself, but my children do things for [place] Patient Stated Goal: Back home independent   Prior Functioning  Home Living Lives With: Spouse Available Help at Discharge: Family Type of Home: House Home Access: Level entry Home Layout: One level Firefighter: Standard Home Adaptive Equipment: Walker - rolling;Straight cane;Shower chair with back;Walker - four wheeled Prior Function Level of Independence: Independent Able to Take Stairs?: Yes Driving: Yes (? her response) Communication Communication: No difficulties    Cognition  Overall Cognitive Status: Appears within functional limits for tasks assessed/performed Arousal/Alertness: Awake/alert Orientation Level: Oriented X4 / Intact Behavior During Session: [facility] for tasks performed    Extremity/Trunk Assessment Right Lower Extremity Assessment RLE ROM/Strength/Tone: Within functional levels Left Lower Extremity Assessment LLE ROM/Strength/Tone: Within functional levels (generally weak Bil) Trunk Assessment Trunk Assessment: Normal   Balance Balance Balance Assessed: Yes Static Sitting Balance Static Sitting - Balance Support: No upper extremity supported;Feet supported Static Sitting - Level of Assistance: 5: Stand by assistance Static Sitting - Comment/# of Minutes: 10  End of Session PT - End of Session Activity Tolerance: Patient tolerated treatment well Patient left: in chair;with call  bell/phone within reach;with chair alarm set Nurse Communication: Mobility status   Dereon Williamsen, Eliseo Gum 03/29/2012, 11:09 AM  03/29/2012  [place] Bing, PT [phone] (  pager)

## 2012-03-29 NOTE — Progress Notes (Signed)
Patient ID: Megan [name], female   DOB: January 07, 1927, 78 y.o.   MRN: [id number] 5 Days Post-Op  Subjective: Pt feels well.  Wanting to go home.  Tolerating clear liquids well  Objective: Vital signs in last 24 hours: Temp:  [98 F (36.7 C)-98.8 F (37.1 C)] 98.3 F (36.8 C) (05/28 0430) Pulse Rate:  [61-80] 80  (05/28 0430) Resp:  [20] 20  (05/28 0430) BP: (128-172)/(55-79) 128/67 mmHg (05/28 0430) SpO2:  [94 %-100 %] 97 % (05/28 0430) Weight:  [157 lb 13.6 oz (71.6 kg)] 157 lb 13.6 oz (71.6 kg) (05/27 2005) Last BM Date: 03/27/12  Intake/Output from previous day: 05/27 0701 - 05/28 0700 In: 2674.6 [P.O.:480; I.V.:2134.6; IV Piggyback:60] Out: 1885 [Urine:1700; Drains:185] Intake/Output this shift:    PE: Abd: soft, NT, Nd, +BS, both JPs with serosang output.  Lab Results:   Basename 03/29/12 0518 03/28/12 0610  WBC 8.3 9.6  HGB 9.9* 9.8*  HCT 29.5* 29.4*  PLT 147* 160   BMET  Basename 03/29/12 0518 03/28/12 0610  NA 138 137  K 3.5 3.1*  CL 107 107  CO2 23 22  GLUCOSE 98 123*  BUN 8 13  CREATININE 0.77 0.85  CALCIUM 8.2* 8.1*   PT/INR No results found for this basename: LABPROT:2,INR:2 in the last 72 hours CMP     Component Value Date/Time   NA 138 03/29/2012 0518   K 3.5 03/29/2012 0518   CL 107 03/29/2012 0518   CO2 23 03/29/2012 0518   GLUCOSE 98 03/29/2012 0518   BUN 8 03/29/2012 0518   CREATININE 0.77 03/29/2012 0518   CALCIUM 8.2* 03/29/2012 0518   PROT 5.5* 03/24/2012 2200   ALBUMIN 2.3* 03/24/2012 2200   AST 24 03/24/2012 2200   ALT 7 03/24/2012 2200   ALKPHOS 39 03/24/2012 2200   BILITOT 1.0 03/24/2012 2200   GFRNONAA 76* 03/29/2012 0518   GFRAA 88* 03/29/2012 0518   Lipase     Component Value Date/Time   LIPASE 13 11/01/2007 2208       Studies/Results: Dg Ugi W/water Sol Cm  03/28/2012  *RADIOLOGY REPORT*  Clinical Data:  Perforated pyloric channel ulcer.  UPPER GI SERIES WITH KUB  Technique:  Routine upper GI series was performed with water-  soluble contrast.  Fluoroscopy Time: 3.26 minutes slow pulsed fluoroscopy  Comparison:  CT scan dated 03/25/2012  Findings:  KUB demonstrates a normal bowel gas pattern.  Calcifications in a cyst in the lower pole of the left kidney.  The patient ingested water-soluble contrast which passed immediately through the pylorus and into the duodenal bulb and C- loop.  The patient does have a small hiatal hernia with spontaneous gastroesophageal reflux.  There is a diverticulum in the second portion of the duodenum.  There is marked deformity of the pyloric channel with what appears to be the remnant of the large pyloric channel ulcer.  IMPRESSION: No evidence of extravasation.  There is a deformity with what appears to be a prominent ulcer crater along the greater curvature of the pylorus.  No obstruction.  Original Report Authenticated By: Gwynn Burly, M.D.    Anti-infectives: Anti-infectives     Start     Dose/Rate Route Frequency Ordered Stop   03/27/12 1530   fluconazole (DIFLUCAN) IVPB 200 mg  Status:  Discontinued        200 mg 100 mL/hr over 60 Minutes Intravenous Every 24 hours 03/27/12 1527 03/27/12 1534   03/26/12 1400  piperacillin-tazobactam (ZOSYN) IVPB 3.375  g       3.375 g 12.5 mL/hr over 240 Minutes Intravenous 3 times per day 03/26/12 1252     03/26/12 0600   fluconazole (DIFLUCAN) IVPB 200 mg        200 mg 100 mL/hr over 60 Minutes Intravenous Every 24 hours 03/25/12 0406     03/25/12 0600   piperacillin-tazobactam (ZOSYN) IVPB 2.25 g  Status:  Discontinued        2.25 g 100 mL/hr over 30 Minutes Intravenous 4 times per day 03/25/12 0406 03/26/12 1252   03/25/12 0415   piperacillin-tazobactam (ZOSYN) IVPB 3.375 g  Status:  Discontinued        3.375 g 100 mL/hr over 30 Minutes Intravenous  Once 03/25/12 0406 03/25/12 0452   03/25/12 0145   gentamycin 80 mg in 0.9% normal saline 250 mL irrigation  Status:  Discontinued        80 mg Irrigation Every 5 min 03/25/12 0122  03/25/12 0406   03/25/12 0145   clindamycin (CLEOCIN) IVPB 900 mg        900 mg 100 mL/hr over 30 Minutes Intravenous  Once 03/25/12 0142 03/25/12 0652   03/25/12 0130   clindamycin (CLEOCIN) injection 900 mg  Status:  Discontinued        900 mg Intravenous  Once 03/25/12 0126 03/25/12 0141   03/25/12 0100   fluconazole (DIFLUCAN) IVPB 200 mg        200 mg 100 mL/hr over 60 Minutes Intravenous  Once 03/25/12 0051 03/25/12 0735   03/24/12 2345  piperacillin-tazobactam (ZOSYN) IVPB 3.375 g       3.375 g 100 mL/hr over 30 Minutes Intravenous  Once 03/24/12 2341 03/25/12 0015           Assessment/Plan  1. S/p lap repair of perf gastric ulcer  Plan: 1. Advance to full liquid diet today. 2. Will look at d/c JP drains within the next day or so if tolerates a diet without change in output.   LOS: 5 days    Alexarae Oliva E 03/29/2012

## 2012-03-29 NOTE — Progress Notes (Signed)
Nutrition Follow-up  Extubated 5/25. Had BM on 5/27, pulled NGT out. Placed on clear liquids on 5/27, tolerating well, advanced to full liquids on 5/28. Consumed 100% of breakfast tray.  Diet Order:  Full Liquids  Meds: Scheduled Meds:   . bisacodyl  10 mg Rectal Daily  . fluconazole (DIFLUCAN) IV  200 mg Intravenous Q24H  . heparin  5,000 Units Subcutaneous Q8H  . insulin aspart  0-9 Units Subcutaneous Q4H  . levothyroxine  26 mcg Intravenous Daily  . magnesium sulfate 1 - 4 g bolus IVPB  2 g Intravenous Once  . pantoprazole (PROTONIX) IV  40 mg Intravenous Q12H  . piperacillin-tazobactam (ZOSYN)  IV  3.375 g Intravenous Q8H  . potassium chloride  10 mEq Intravenous Q1 Hr x 4  . saccharomyces boulardii  250 mg Oral BID  . white petrolatum   Topical BID   Continuous Infusions:   . dexrose 5 % and 0.45 % NaCl with KCl 30 mEq/L 50 mL/hr at 03/28/12 1845  . DISCONTD: dextrose 5 % and 0.45% NaCl 75 mL/hr at 03/28/12 1305   PRN Meds:.acetaminophen, fentaNYL, labetalol, ondansetron (ZOFRAN) IV  Labs:  CMP     Component Value Date/Time   NA 138 03/29/2012 0518   K 3.5 03/29/2012 0518   CL 107 03/29/2012 0518   CO2 23 03/29/2012 0518   GLUCOSE 98 03/29/2012 0518   BUN 8 03/29/2012 0518   CREATININE 0.77 03/29/2012 0518   CALCIUM 8.2* 03/29/2012 0518   PROT 5.5* 03/24/2012 2200   ALBUMIN 2.3* 03/24/2012 2200   AST 24 03/24/2012 2200   ALT 7 03/24/2012 2200   ALKPHOS 39 03/24/2012 2200   BILITOT 1.0 03/24/2012 2200   GFRNONAA 76* 03/29/2012 0518   GFRAA 88* 03/29/2012 0518   Phosphorus  Date/Time Value Range Status  03/28/2012  6:10 AM 1.7* 2.3-4.6 (mg/dL) Final   Magnesium  Date/Time Value Range Status  03/29/2012  5:18 AM 2.1  1.5-2.5 (mg/dL) Final    Intake/Output Summary (Last 24 hours) at 03/29/12 1039 Last data filed at 03/29/12 0700  Gross per 24 hour  Intake 1702.92 ml  Output   1805 ml  Net -102.08 ml    Weight Status:  71.6 kg - wt up 3.6 kg x 4 days  Re-estimated  needs:  1550 - 1750 kcal, 85 - 95 grams protein  Nutrition Dx:  Inadequate oral intake r/t recent NPO and clear liquid diet prescription AEB clear liquid diet provision.  Goal:  Meet 90 - 100% of estimated nutrition needs. Unmet.  Intervention:   1. Add Ensure Complete daily for additional protein and kcal during diet advancement; 8 oz contains 350 kcal and 13 grams protein 2. RD to continue to follow nutrition care plan  Monitor:  Weights, labs, PO Intake, I/O's  [place] Laundry Pager #:  [phone]

## 2012-03-29 NOTE — Progress Notes (Signed)
Patient ID: Megan [name], female   DOB: 1927-09-26, 78 y.o.   MRN: [id number] 5 Days Post-Op  Subjective: Tolerated clears.  No leak on contrast study.  Objective: Vital signs in last 24 hours: Temp:  [98 F (36.7 C)-98.8 F (37.1 C)] 98.3 F (36.8 C) (05/28 0430) Pulse Rate:  [61-80] 80  (05/28 0430) Resp:  [20] 20  (05/28 0430) BP: (128-172)/(55-79) 128/67 mmHg (05/28 0430) SpO2:  [94 %-100 %] 97 % (05/28 0430) Weight:  [157 lb 13.6 oz (71.6 kg)] 157 lb 13.6 oz (71.6 kg) (05/27 2005) Last BM Date: 03/27/12  Intake/Output from previous day: 05/27 0701 - 05/28 0700 In: 2914.6 [P.O.:720; I.V.:2134.6; IV Piggyback:60] Out: 1885 [Urine:1700; Drains:185] Intake/Output this shift:    PE: Abd-soft, incisions clean, serous drain output Mildly tender with palpation.    Lab Results:   Basename 03/29/12 0518 03/28/12 0610  WBC 8.3 9.6  HGB 9.9* 9.8*  HCT 29.5* 29.4*  PLT 147* 160   BMET  Basename 03/29/12 0518 03/28/12 0610  NA 138 137  K 3.5 3.1*  CL 107 107  CO2 23 22  GLUCOSE 98 123*  BUN 8 13  CREATININE 0.77 0.85  CALCIUM 8.2* 8.1*   PT/INR No results found for this basename: LABPROT:2,INR:2 in the last 72 hours Comprehensive Metabolic Panel:    Component Value Date/Time   NA 138 03/29/2012 0518   K 3.5 03/29/2012 0518   CL 107 03/29/2012 0518   CO2 23 03/29/2012 0518   BUN 8 03/29/2012 0518   CREATININE 0.77 03/29/2012 0518   GLUCOSE 98 03/29/2012 0518   CALCIUM 8.2* 03/29/2012 0518   AST 24 03/24/2012 2200   ALT 7 03/24/2012 2200   ALKPHOS 39 03/24/2012 2200   BILITOT 1.0 03/24/2012 2200   PROT 5.5* 03/24/2012 2200   ALBUMIN 2.3* 03/24/2012 2200     Studies/Results: Dg Ugi W/water Sol Cm  03/28/2012  *RADIOLOGY REPORT*  Clinical Data:  Perforated pyloric channel ulcer.  UPPER GI SERIES WITH KUB  Technique:  Routine upper GI series was performed with water- soluble contrast.  Fluoroscopy Time: 3.26 minutes slow pulsed fluoroscopy  Comparison:  CT scan dated  03/25/2012  Findings:  KUB demonstrates a normal bowel gas pattern.  Calcifications in a cyst in the lower pole of the left kidney.  The patient ingested water-soluble contrast which passed immediately through the pylorus and into the duodenal bulb and C- loop.  The patient does have a small hiatal hernia with spontaneous gastroesophageal reflux.  There is a diverticulum in the second portion of the duodenum.  There is marked deformity of the pyloric channel with what appears to be the remnant of the large pyloric channel ulcer.  IMPRESSION: No evidence of extravasation.  There is a deformity with what appears to be a prominent ulcer crater along the greater curvature of the pylorus.  No obstruction.  Original Report Authenticated By: Gwynn Burly, M.D.    Anti-infectives: Anti-infectives     Start     Dose/Rate Route Frequency Ordered Stop   03/27/12 1530   fluconazole (DIFLUCAN) IVPB 200 mg  Status:  Discontinued        200 mg 100 mL/hr over 60 Minutes Intravenous Every 24 hours 03/27/12 1527 03/27/12 1534   03/26/12 1400   piperacillin-tazobactam (ZOSYN) IVPB 3.375 g        3.375 g 12.5 mL/hr over 240 Minutes Intravenous 3 times per day 03/26/12 1252     03/26/12 0600   fluconazole (  DIFLUCAN) IVPB 200 mg        200 mg 100 mL/hr over 60 Minutes Intravenous Every 24 hours 03/25/12 0406     03/25/12 0600   piperacillin-tazobactam (ZOSYN) IVPB 2.25 g  Status:  Discontinued        2.25 g 100 mL/hr over 30 Minutes Intravenous 4 times per day 03/25/12 0406 03/26/12 1252   03/25/12 0415   piperacillin-tazobactam (ZOSYN) IVPB 3.375 g  Status:  Discontinued        3.375 g 100 mL/hr over 30 Minutes Intravenous  Once 03/25/12 0406 03/25/12 0452   03/25/12 0145   gentamycin 80 mg in 0.9% normal saline 250 mL irrigation  Status:  Discontinued        80 mg Irrigation Every 5 min 03/25/12 0122 03/25/12 0406   03/25/12 0145   clindamycin (CLEOCIN) IVPB 900 mg        900 mg 100 mL/hr over 30  Minutes Intravenous  Once 03/25/12 0142 03/25/12 0652   03/25/12 0130   clindamycin (CLEOCIN) injection 900 mg  Status:  Discontinued        900 mg Intravenous  Once 03/25/12 0126 03/25/12 0141   03/25/12 0100   fluconazole (DIFLUCAN) IVPB 200 mg        200 mg 100 mL/hr over 60 Minutes Intravenous  Once 03/25/12 0051 03/25/12 0735   03/24/12 2345   piperacillin-tazobactam (ZOSYN) IVPB 3.375 g        3.375 g 100 mL/hr over 30 Minutes Intravenous  Once 03/24/12 2341 03/25/12 0015          Assessment Principal Problem:  Perforated pyloric ulcer s/p laparoscopic repair 03/25/12   LOS: 5 days   Plan:  Advance diet to full liquids today. Continue antibiotics.   [facility] 03/29/2012

## 2012-03-29 NOTE — Progress Notes (Signed)
Subjective: Abdominal pain is controlled.   Objective: Weight change:   Intake/Output Summary (Last 24 hours) at 03/29/12 1151 Last data filed at 03/29/12 0700  Gross per 24 hour  Intake 1702.92 ml  Output   1805 ml  Net -102.08 ml    Filed Vitals:   03/29/12 0951  BP: 151/60  Pulse: 66  Temp: 97.8 F (36.6 C)  Resp: 20   Physical Exam:  General: No acute respiratory distress  Lungs: Clear to auscultation bilaterally without wheezes or crackles  Cardiovascular: Regular rate and rhythm without murmur gallop or rub normal S1 and S2  Abdomen: Nontender, nondistended, soft, bowel sounds positive, no rebound, no ascites, no appreciable mass - lap wounds dressed and dry  Extremities: No significant cyanosis, clubbing, or edema bilateral lower extremities   Lab Results: Results for orders placed during the hospital encounter of 03/24/12 (from the past 24 hour(s))  GLUCOSE, CAPILLARY     Status: Abnormal   Collection Time   03/28/12  4:18 PM      Component Value Range   Glucose-Capillary 141 (*) 70 - 99 (mg/dL)   Comment 1 Documented in Chart     Comment 2 Notify RN    GLUCOSE, CAPILLARY     Status: Abnormal   Collection Time   03/28/12  8:01 PM      Component Value Range   Glucose-Capillary 137 (*) 70 - 99 (mg/dL)  GLUCOSE, CAPILLARY     Status: Normal   Collection Time   03/29/12 12:01 AM      Component Value Range   Glucose-Capillary 81  70 - 99 (mg/dL)  GLUCOSE, CAPILLARY     Status: Normal   Collection Time   03/29/12  4:28 AM      Component Value Range   Glucose-Capillary 91  70 - 99 (mg/dL)  BASIC METABOLIC PANEL     Status: Abnormal   Collection Time   03/29/12  5:18 AM      Component Value Range   Sodium 138  135 - 145 (mEq/L)   Potassium 3.5  3.5 - 5.1 (mEq/L)   Chloride 107  96 - 112 (mEq/L)   CO2 23  19 - 32 (mEq/L)   Glucose, Bld 98  70 - 99 (mg/dL)   BUN 8  6 - 23 (mg/dL)   Creatinine, Ser 1.61  0.50 - 1.10 (mg/dL)   Calcium 8.2 (*) 8.4 - 10.5 (mg/dL)    GFR calc non Af Amer 76 (*) >90 (mL/min)   GFR calc Af Amer 88 (*) >90 (mL/min)  MAGNESIUM     Status: Normal   Collection Time   03/29/12  5:18 AM      Component Value Range   Magnesium 2.1  1.5 - 2.5 (mg/dL)  CBC     Status: Abnormal   Collection Time   03/29/12  5:18 AM      Component Value Range   WBC 8.3  4.0 - 10.5 (K/uL)   RBC 3.52 (*) 3.87 - 5.11 (MIL/uL)   Hemoglobin 9.9 (*) 12.0 - 15.0 (g/dL)   HCT 09.6 (*) 04.5 - 46.0 (%)   MCV 83.8  78.0 - 100.0 (fL)   MCH 28.1  26.0 - 34.0 (pg)   MCHC 33.6  30.0 - 36.0 (g/dL)   RDW 40.9  81.1 - 91.4 (%)   Platelets 147 (*) 150 - 400 (K/uL)  GLUCOSE, CAPILLARY     Status: Normal   Collection Time   03/29/12  7:23 AM  Component Value Range   Glucose-Capillary 97  70 - 99 (mg/dL)   Comment 1 Documented in Chart     Comment 2 Notify RN    GLUCOSE, CAPILLARY     Status: Abnormal   Collection Time   03/29/12 11:26 AM      Component Value Range   Glucose-Capillary 181 (*) 70 - 99 (mg/dL)   Comment 1 Documented in Chart     Comment 2 Notify RN       Micro Results: Recent Results (from the past 240 hour(s))  URINE CULTURE     Status: Normal   Collection Time   03/24/12  9:55 PM      Component Value Range Status Comment   Specimen Description URINE, CATHETERIZED   Final    Special Requests Normal   Final    Culture  Setup Time [id number]   Final    Colony Count NO GROWTH   Final    Culture NO GROWTH   Final    Report Status 03/25/2012 FINAL   Final   CULTURE, BLOOD (ROUTINE X 2)     Status: Normal (Preliminary result)   Collection Time   03/24/12 10:00 PM      Component Value Range Status Comment   Specimen Description BLOOD HAND RIGHT   Final    Special Requests BOTTLES DRAWN AEROBIC ONLY 4CC   Final    Culture  Setup Time [id number]   Final    Culture     Final    Value:        BLOOD CULTURE RECEIVED NO GROWTH TO DATE CULTURE WILL BE HELD FOR 5 DAYS BEFORE ISSUING A FINAL NEGATIVE REPORT   Report Status PENDING    Incomplete   CULTURE, BLOOD (ROUTINE X 2)     Status: Normal (Preliminary result)   Collection Time   03/24/12 10:30 PM      Component Value Range Status Comment   Specimen Description BLOOD ARM RIGHT   Final    Special Requests BOTTLES DRAWN AEROBIC ONLY 1CC   Final    Culture  Setup Time [id number]   Final    Culture     Final    Value:        BLOOD CULTURE RECEIVED NO GROWTH TO DATE CULTURE WILL BE HELD FOR 5 DAYS BEFORE ISSUING A FINAL NEGATIVE REPORT   Report Status PENDING   Incomplete   ANAEROBIC CULTURE     Status: Normal (Preliminary result)   Collection Time   03/25/12  3:32 AM      Component Value Range Status Comment   Specimen Description FLUID PERITONEAL   Final    Special Requests NONE ON SWAB PATIENT ON FOLLOWING ZOSYN   Final    Gram Stain PENDING   Incomplete    Culture     Final    Value: NO ANAEROBES ISOLATED; CULTURE IN PROGRESS FOR 5 DAYS   Report Status PENDING   Incomplete   BODY FLUID CULTURE     Status: Normal (Preliminary result)   Collection Time   03/25/12  3:32 AM      Component Value Range Status Comment   Specimen Description FLUID PERITONEAL   Final    Special Requests NONE ON SWAB PATIENT ON FOLLOWING ZOSYN   Final    Gram Stain     Final    Value: FEW WBC PRESENT,BOTH PMN AND MONONUCLEAR     NO ORGANISMS SEEN   Culture  Final    Value: FEW YEAST     RARE VIRIDANS STREPTOCOCCUS     FEW HAEMOPHILUS PARAINFLUENZAE     Note: BETA LACTAMASE NEGATIVE     Note: CRITICAL RESULT CALLED TO, READ BACK BY AND VERIFIED WITH: RN A. MINTZ ON 03/27/12 BY DTERRY   Report Status PENDING   Incomplete   MRSA PCR SCREENING     Status: Normal   Collection Time   03/25/12  4:19 AM      Component Value Range Status Comment   MRSA by PCR NEGATIVE  NEGATIVE  Final     Studies/Results: Ct Abdomen Pelvis Wo Contrast  03/25/2012  *RADIOLOGY REPORT*  Clinical Data: Abdominal pain.  Sepsis.  Pneumoperitoneum on x-ray earlier.  Acute renal failure.  CT ABDOMEN AND  PELVIS WITHOUT CONTRAST 03/25/2012:  Technique:  Multidetector CT imaging of the abdomen and pelvis was performed following the standard protocol without intravenous contrast.  Comparison: CT abdomen and pelvis 12/30/2008.  Findings: Moderate pneumoperitoneum and moderate ascites throughout the abdomen and pelvis.  Etiology of the gas-filled to most likely be in the stomach, as there may be intramural gas in the wall of the proximal body of the stomach.  Small bowel normal in appearance.  Large stool burden throughout decompressed colon. Diffuse colonic diverticulosis, though no extraluminal gas adjacent to the colon to suggest this as the etiology.  Focal circumferential wall thickening involving the mid transverse colon in an area of collapsed bowel.  Normal unenhanced appearance of the liver, spleen, and adrenal glands.  Numerous cysts in both kidneys, some of which have calcifications in their walls, with the most calcification involving the wall of a left lower pole cyst.  Within the limits of the unenhanced technique, no definite solid renal masses.  No urinary tract calculi.  No hydronephrosis.  Gallbladder unremarkable by CT.  No biliary ductal dilation. Extensive aorto-iliofemoral atherosclerosis without aneurysm.  No significant lymphadenopathy.  Uterus atrophic consistent with age. No visible adnexal masses.  Phleboliths low in the pelvis.  Urinary bladder decompressed by Foley catheter.  Small right pleural effusion and associated mild passive atelectasis in the right lower lobe.  Heart enlarged.  Bone window images demonstrate severe degenerative changes involving the lumbar spine.  IMPRESSION:  1.  Moderate pneumoperitoneum and moderate ascites.  Etiology felt to most likely be in the stomach (gastric ulcer?) 2.  Diffuse colonic diverticulosis.  Focal circumferential wall thickening involving the mid transverse colon may just be due to the fact that this is the collapse segment.  A colonic mass is not  entirely excluded, however. 3.  Large bilateral renal cysts, some of which have calcification in their walls because of the thickness of the calcification of the cyst in the lower pole of the left kidney, this would be [facility] centered a Bosniak category II F lesion.  The remaining cysts are Bosniak category II. 4.  Peritoneal carcinomatosis is suspected. 5.  Small right pleural effusion and associated passive atelectasis or pneumonia in the right lower lobe.  Critical results were discussed directly with Dr. Michaell Cowing of general surgery at the time of interpretation on 03/25/2012 at 0040 hours.  Original Report Authenticated By: Arnell Sieving, M.D.   Ct Head Wo Contrast  03/24/2012  *RADIOLOGY REPORT*  Clinical Data: Acute mental status changes with lethargy.  CT HEAD WITHOUT CONTRAST  Technique:  Contiguous axial images were obtained from the base of the skull through the vertex without contrast.  Comparison: MRI brain 01/15/2010, 06/06/2008.  Unenhanced cranial CT 01/13/2010.  Findings: Moderate cortical and deep atrophy, mild cerebellar atrophy, and severe changes of small vessel disease of the white matter diffusely, unchanged.  Physiologic calcifications in the right basal ganglia, unchanged. No mass lesion.  No midline shift. No acute hemorrhage or hematoma.  No extra-axial fluid collections. No evidence of acute infarction.  No significant interval change.  Severe changes of hyperostosis frontalis interna bilaterally. Visualized paranasal sinuses, mastoid air cells, and middle ear cavities well-aerated.  Severe bilateral carotid siphon and vertebral artery atherosclerosis.  IMPRESSION:  1.  No acute intracranial abnormality. 2.  Stable moderate generalized atrophy and severe chronic microvascular ischemic changes of the white matter diffusely.  Original Report Authenticated By: Arnell Sieving, M.D.   Dg Chest Port 1 View  03/27/2012  *RADIOLOGY REPORT*  Clinical Data: Follow up respiratory failure   PORTABLE CHEST - 1 VIEW  Comparison: 03/26/2012  Findings: Cardiomegaly.  Mild patchy right perihilar opacity, possibly atelectasis or asymmetric edema.  Early/developing pneumonia not excluded.  Patchy bibasilar opacities, likely atelectasis, with suspected small left pleural effusion.  No pneumothorax.  Enteric tube coursing below the diaphragm.  Stable right IJ venous catheter.  Subphrenic drain on the right.  IMPRESSION: Mild patchy right perihilar opacity, possibly atelectasis or asymmetric edema. Early pneumonia not excluded.  Bibasilar atelectasis with suspected small left pleural effusion.  Cardiomegaly.  Original Report Authenticated By: Charline Bills, M.D.   Dg Chest Port 1 View  03/26/2012  *RADIOLOGY REPORT*  Clinical Data: Endotracheal tube placement  PORTABLE CHEST - 1 VIEW  Comparison: Yesterday  Findings: Normal heart size.  Endotracheal tube, NG tube, right internal jugular vein center venous catheter are stable. Increasing haziness at both lung bases likely a combination of pleural fluid and volume loss.  IMPRESSION: Increasing haziness at both lung bases as described.  Original Report Authenticated By: Donavan Burnet, M.D.   Portable Chest Xray  03/25/2012  *RADIOLOGY REPORT*  Clinical Data:  Evaluate tube position.  PORTABLE CHEST - 1 VIEW  Comparison:  03/25/2012  Findings: Endotracheal tube tip is located 4.5 cm proximal to the carina.  Right IJ catheter tip projects over the mid SVC.  Stable cardiomediastinal contours.  Aortic arch atherosclerosis. Bibasilar opacities.  No pneumothorax.  Cannot exclude small pleural effusions.  Osteopenia and multilevel degenerative changes. NG tube side port is at the GE junction and therefore should be advanced.  IMPRESSION: Endotracheal tube tip appropriately positioned.  NG tube side port is at the GE junction and therefore should be advanced.  Bibasilar opacities.  Original Report Authenticated By: Waneta Martins, M.D.   Dg Chest Portable 1  View  03/25/2012  *RADIOLOGY REPORT*  Clinical Data: Central venous catheter placement.  PORTABLE CHEST - 1 VIEW 03/25/2012 0114 hours:  Comparison: Portable chest x-ray yesterday.  Two-view chest x-ray 01/13/2010.  Findings: Right jugular central venous catheter tip in the SVC.  No evidence of pneumothorax or mediastinal hematoma.  Suboptimal inspiration with atelectasis in the lung bases.  Cardiac silhouette mildly enlarged but stable.  Pulmonary vascularity normal without evidence pulmonary edema.  IMPRESSION:  1.  Right jugular central venous catheter tip in the SVC.  No acute complicating features. 2.  Suboptimal inspiration accounts for bibasilar atelectasis.  No acute cardiopulmonary disease otherwise.  Stable cardiomegaly without pulmonary edema.  Original Report Authenticated By: Arnell Sieving, M.D.   Dg Chest Portable 1 View  03/24/2012  *RADIOLOGY REPORT*  Clinical Data: Altered mental status, hypertension  PORTABLE CHEST -  1 VIEW  Comparison: 01/13/2010  Findings: Cardiomegaly.  Central vascular congestion. Aortic arch atherosclerosis.  No overt edema or pneumothorax.  Hemidiaphragm elevation obscures the lung bases.  Small effusions not excluded. Mild lung base opacities.  Osteopenia.  Shoulder degenerative changes and multilevel degenerative changes of the thoracic vertebrae.  IMPRESSION: Prominent cardiomediastinal contours, similar to prior.  Mild lung base opacities; atelectasis versus infiltrate.  Original Report Authenticated By: Waneta Martins, M.D.   Dg Abd 2 Views  03/24/2012  *RADIOLOGY REPORT*  Clinical Data: Abdominal pain and tenderness.  ABDOMEN - 2 VIEW  Comparison: CT abdomen pelvis 12/30/2008.  Findings: Bowel gas pattern unremarkable without evidence of obstruction or significant ileus.  Free intraperitoneal air identified adjacent to the liver on the lateral decubitus view. Aorto-iliac atherosclerosis.  No visible opaque urinary tract calculi.  Osteopenia, severe  degenerative changes involving the lumbar spine, and severe degenerative changes involving the right sacroiliac joint and both hips.  IMPRESSION: Pneumoperitoneum.  Critical Value/emergent results were called by telephone at the time of interpretation on 03/24/2012  at 2315 hours  to  Dr. Oletta Lamas of the emergency department, who verbally acknowledged these results.  Original Report Authenticated By: Arnell Sieving, M.D.   Dg Kayleen Memos W/water Sol Cm  03/28/2012  *RADIOLOGY REPORT*  Clinical Data:  Perforated pyloric channel ulcer.  UPPER GI SERIES WITH KUB  Technique:  Routine upper GI series was performed with water- soluble contrast.  Fluoroscopy Time: 3.26 minutes slow pulsed fluoroscopy  Comparison:  CT scan dated 03/25/2012  Findings:  KUB demonstrates a normal bowel gas pattern.  Calcifications in a cyst in the lower pole of the left kidney.  The patient ingested water-soluble contrast which passed immediately through the pylorus and into the duodenal bulb and C- loop.  The patient does have a small hiatal hernia with spontaneous gastroesophageal reflux.  There is a diverticulum in the second portion of the duodenum.  There is marked deformity of the pyloric channel with what appears to be the remnant of the large pyloric channel ulcer.  IMPRESSION: No evidence of extravasation.  There is a deformity with what appears to be a prominent ulcer crater along the greater curvature of the pylorus.  No obstruction.  Original Report Authenticated By: Gwynn Burly, M.D.   Medications: Scheduled Meds:   . bisacodyl  10 mg Rectal Daily  . feeding supplement  237 mL Oral Q24H  . fluconazole (DIFLUCAN) IV  200 mg Intravenous Q24H  . heparin  5,000 Units Subcutaneous Q8H  . insulin aspart  0-9 Units Subcutaneous Q4H  . levothyroxine  26 mcg Intravenous Daily  . magnesium sulfate 1 - 4 g bolus IVPB  2 g Intravenous Once  . pantoprazole (PROTONIX) IV  40 mg Intravenous Q12H  . piperacillin-tazobactam (ZOSYN)   IV  3.375 g Intravenous Q8H  . potassium chloride  10 mEq Intravenous Q1 Hr x 4  . saccharomyces boulardii  250 mg Oral BID  . white petrolatum   Topical BID   Continuous Infusions:   . dexrose 5 % and 0.45 % NaCl with KCl 30 mEq/L 50 mL/hr at 03/28/12 1845  . DISCONTD: dextrose 5 % and 0.45% NaCl 75 mL/hr at 03/28/12 1305   PRN Meds:.acetaminophen, fentaNYL, labetalol, ondansetron (ZOFRAN) IV  Assessment/Plan: Patient Active Hospital Problem List:  Peritonitis s/p perforated pyloric ulcer - OR 5/24 for laparoscopic repair w/ omental patch  Improving. - diet being advanced by surgery .  Acute renal failure  Due to sepsis/hypotension -  resolved  Lactic Acidosis  Due to above - resolved  Hypokalemia  Replace with goal of 4.0 or > - Mg is borderline so will also supplement Mg  Anemia of critical illness  Hgb beginning to improve  HTN  Uncontrolled - adjust tx and follow trend  DM  Reasonably controlled at the present time  Diastolic CHF via echo 2011  No clinical evidence of volume overload  hypothyroidism  Remains on synthroid  Dispo  Mobilize - PT/OT - pt is highly motivated to d/c home asap   LOS: 5 days   Kodi Guerrera 03/29/2012, 11:51 AM

## 2012-03-29 NOTE — Progress Notes (Signed)
Agree with above 

## 2012-03-30 ENCOUNTER — Encounter (HOSPITAL_COMMUNITY): Payer: Self-pay | Admitting: Surgery

## 2012-03-30 DIAGNOSIS — K250 Acute gastric ulcer with hemorrhage: Secondary | ICD-10-CM

## 2012-03-30 DIAGNOSIS — K650 Generalized (acute) peritonitis: Secondary | ICD-10-CM

## 2012-03-30 DIAGNOSIS — N170 Acute kidney failure with tubular necrosis: Secondary | ICD-10-CM

## 2012-03-30 DIAGNOSIS — I959 Hypotension, unspecified: Secondary | ICD-10-CM

## 2012-03-30 LAB — GLUCOSE, CAPILLARY
Glucose-Capillary: 110 mg/dL — ABNORMAL HIGH (ref 70–99)
Glucose-Capillary: 145 mg/dL — ABNORMAL HIGH (ref 70–99)

## 2012-03-30 LAB — BODY FLUID CULTURE

## 2012-03-30 MED ORDER — ACETAMINOPHEN 325 MG PO TABS
650.0000 mg | ORAL_TABLET | ORAL | Status: DC | PRN
  Administered 2012-03-31 (×3): 650 mg via ORAL
  Filled 2012-03-30 (×3): qty 2

## 2012-03-30 MED ORDER — TRAMADOL HCL 50 MG PO TABS
50.0000 mg | ORAL_TABLET | Freq: Four times a day (QID) | ORAL | Status: DC | PRN
  Administered 2012-03-30: 50 mg via ORAL
  Filled 2012-03-30 (×2): qty 1

## 2012-03-30 MED ORDER — PANTOPRAZOLE SODIUM 40 MG PO TBEC
40.0000 mg | DELAYED_RELEASE_TABLET | Freq: Two times a day (BID) | ORAL | Status: DC
  Administered 2012-03-30 – 2012-04-01 (×4): 40 mg via ORAL
  Filled 2012-03-30 (×4): qty 1

## 2012-03-30 NOTE — Progress Notes (Signed)
Patient ID: Megan [name], female   DOB: 10-13-1927, 78 y.o.   MRN: [id number] 6 Days Post-Op  Subjective: Pt feels well.  Wants to go home.  Tolerating full liquids and wants solid food.  Objective: Vital signs in last 24 hours: Temp:  [97.8 F (36.6 C)-98.7 F (37.1 C)] 98 F (36.7 C) (05/29 0430) Pulse Rate:  [65-88] 76  (05/29 0430) Resp:  [17-20] 17  (05/29 0430) BP: (141-162)/(57-71) 162/68 mmHg (05/29 0430) SpO2:  [97 %-99 %] 99 % (05/29 0430) Weight:  [159 lb 9.6 oz (72.394 kg)] 159 lb 9.6 oz (72.394 kg) (05/28 2200) Last BM Date: 03/29/12  Intake/Output from previous day: 05/28 0701 - 05/29 0700 In: 1812.5 [P.O.:240; I.V.:1462.5] Out: 900 [Urine:900] Intake/Output this shift:    PE: Abd: soft, minimally tender, +BS, JPs with serous output only.  Incisions c/d/i  Lab Results:   Basename 03/29/12 0518 03/28/12 0610  WBC 8.3 9.6  HGB 9.9* 9.8*  HCT 29.5* 29.4*  PLT 147* 160   BMET  Basename 03/29/12 0518 03/28/12 0610  NA 138 137  K 3.5 3.1*  CL 107 107  CO2 23 22  GLUCOSE 98 123*  BUN 8 13  CREATININE 0.77 0.85  CALCIUM 8.2* 8.1*   PT/INR No results found for this basename: LABPROT:2,INR:2 in the last 72 hours CMP     Component Value Date/Time   NA 138 03/29/2012 0518   K 3.5 03/29/2012 0518   CL 107 03/29/2012 0518   CO2 23 03/29/2012 0518   GLUCOSE 98 03/29/2012 0518   BUN 8 03/29/2012 0518   CREATININE 0.77 03/29/2012 0518   CALCIUM 8.2* 03/29/2012 0518   PROT 5.5* 03/24/2012 2200   ALBUMIN 2.3* 03/24/2012 2200   AST 24 03/24/2012 2200   ALT 7 03/24/2012 2200   ALKPHOS 39 03/24/2012 2200   BILITOT 1.0 03/24/2012 2200   GFRNONAA 76* 03/29/2012 0518   GFRAA 88* 03/29/2012 0518   Lipase     Component Value Date/Time   LIPASE 13 11/01/2007 2208       Studies/Results: Dg Ugi W/water Sol Cm  03/28/2012  *RADIOLOGY REPORT*  Clinical Data:  Perforated pyloric channel ulcer.  UPPER GI SERIES WITH KUB  Technique:  Routine upper GI series was performed  with water- soluble contrast.  Fluoroscopy Time: 3.26 minutes slow pulsed fluoroscopy  Comparison:  CT scan dated 03/25/2012  Findings:  KUB demonstrates a normal bowel gas pattern.  Calcifications in a cyst in the lower pole of the left kidney.  The patient ingested water-soluble contrast which passed immediately through the pylorus and into the duodenal bulb and C- loop.  The patient does have a small hiatal hernia with spontaneous gastroesophageal reflux.  There is a diverticulum in the second portion of the duodenum.  There is marked deformity of the pyloric channel with what appears to be the remnant of the large pyloric channel ulcer.  IMPRESSION: No evidence of extravasation.  There is a deformity with what appears to be a prominent ulcer crater along the greater curvature of the pylorus.  No obstruction.  Original Report Authenticated By: Gwynn Burly, M.D.    Anti-infectives: Anti-infectives     Start     Dose/Rate Route Frequency Ordered Stop   03/30/12 0800   fluconazole (DIFLUCAN) IVPB 400 mg        400 mg 200 mL/hr over 60 Minutes Intravenous Every 24 hours 03/29/12 1218     03/27/12 1530   fluconazole (DIFLUCAN) IVPB 200  mg  Status:  Discontinued        200 mg 100 mL/hr over 60 Minutes Intravenous Every 24 hours 03/27/12 1527 03/27/12 1534   03/26/12 1400  piperacillin-tazobactam (ZOSYN) IVPB 3.375 g       3.375 g 12.5 mL/hr over 240 Minutes Intravenous 3 times per day 03/26/12 1252     03/26/12 0600   fluconazole (DIFLUCAN) IVPB 200 mg  Status:  Discontinued        200 mg 100 mL/hr over 60 Minutes Intravenous Every 24 hours 03/25/12 0406 03/29/12 1218   03/25/12 0600   piperacillin-tazobactam (ZOSYN) IVPB 2.25 g  Status:  Discontinued        2.25 g 100 mL/hr over 30 Minutes Intravenous 4 times per day 03/25/12 0406 03/26/12 1252   03/25/12 0415   piperacillin-tazobactam (ZOSYN) IVPB 3.375 g  Status:  Discontinued        3.375 g 100 mL/hr over 30 Minutes Intravenous   Once 03/25/12 0406 03/25/12 0452   03/25/12 0145   gentamycin 80 mg in 0.9% normal saline 250 mL irrigation  Status:  Discontinued        80 mg Irrigation Every 5 min 03/25/12 0122 03/25/12 0406   03/25/12 0145   clindamycin (CLEOCIN) IVPB 900 mg        900 mg 100 mL/hr over 30 Minutes Intravenous  Once 03/25/12 0142 03/25/12 0652   03/25/12 0130   clindamycin (CLEOCIN) injection 900 mg  Status:  Discontinued        900 mg Intravenous  Once 03/25/12 0126 03/25/12 0141   03/25/12 0100   fluconazole (DIFLUCAN) IVPB 200 mg        200 mg 100 mL/hr over 60 Minutes Intravenous  Once 03/25/12 0051 03/25/12 0735   03/24/12 2345  piperacillin-tazobactam (ZOSYN) IVPB 3.375 g       3.375 g 100 mL/hr over 30 Minutes Intravenous  Once 03/24/12 2341 03/25/12 0015           Assessment/Plan  1. S/p repair of perf duo ulcer  Plan: 1. Advance diet to regular diet today.  Will look at dc JP drains tomorrow 2. Started on zosyn this weekend, but WBC normal and AF.  Do not anticipate she needs much more abx therapy. 3. If she tolerates regular diet today, suspect she will be stable for dc tomorrow from a surgical standpoint.   LOS: 6 days    [name] 03/30/2012

## 2012-03-30 NOTE — Progress Notes (Signed)
Occupational Therapy Treatment Patient Details Name: RITAJ DULLEA MRN: [id number] DOB: 10/19/1927 Today's Date: 03/30/2012 Time: 7829-5621 OT Time Calculation (min): 31 min  OT Assessment / Plan / Recommendation Comments on Treatment Session Pt. presented sitting EOB and reports she is feeling fatigued. Pt. able to complete ~8' with RW around bed to bedside commode and then to chair. however pt. fatigud very quickly. Pt. reports she is under a lot of stress about bills and taking care of her husband at home. Pt. continues to benefit from close supervision and assist from low surfaces during sit-stand transfers.    Follow Up Recommendations  Home health OT;Supervision - Intermittent       Equipment Recommendations  None recommended by OT       Frequency Min 2X/week   Plan Discharge plan remains appropriate    Precautions / Restrictions Precautions Precautions: Fall Restrictions Weight Bearing Restrictions: No       ADL  Grooming: Performed;Set up;Supervision/safety;Wash/dry face Where Assessed - Grooming: Unsupported standing Lower Body Dressing: Performed;Minimal assistance (donning socks) Where Assessed - Lower Body Dressing: Unsupported sit to stand Toilet Transfer: Performed;Minimal assistance Toilet Transfer Method: Sit to stand Toilet Transfer Equipment: Bedside commode Toileting - Clothing Manipulation and Hygiene: Performed;Set up Where Assessed - Toileting Clothing Manipulation and Hygiene: Sit to stand from 3-in-1 or toilet Transfers/Ambulation Related to ADLs: Pt. provided with close supervision with mobility      OT Goals Acute Rehab OT Goals OT Goal Formulation: With patient Time For Goal Achievement: 04/05/12 Potential to Achieve Goals: Good ADL Goals Pt Will Perform Grooming: with set-up;with supervision;Standing at sink ADL Goal: Grooming - Progress: Progressing toward goals Pt Will Perform Lower Body Dressing: with set-up;with supervision;Sit to  stand from bed ADL Goal: Lower Body Dressing - Progress: Progressing toward goals Pt Will Transfer to Toilet: with supervision;Ambulation;with DME;3-in-1 ADL Goal: Toilet Transfer - Progress: Progressing toward goals Pt Will Perform Toileting - Hygiene: with set-up;with supervision;Sit to stand from 3-in-1/toilet ADL Goal: Toileting - Hygiene - Progress: Progressing toward goals Additional ADL Goal #1: Pt. will use 2 energy conservation techniques with an ADL task. ADL Goal: Additional Goal #1 - Progress: Progressing toward goals  Visit Information  Last OT Received On: 03/30/12 Assistance Needed: +1          Cognition  Overall Cognitive Status: Appears within functional limits for tasks assessed/performed Arousal/Alertness: Awake/alert Orientation Level: Oriented X4 / Intact Behavior During Session: [facility] for tasks performed    Mobility Bed Mobility Bed Mobility: Not assessed Details for Bed Mobility Assistance: Pt. presented sitting EOB Transfers Sit to Stand: 4: Min guard Stand to Sit: 4: Min guard Details for Transfer Assistance: min verbal cues for hand placement and technique         End of Session OT - End of Session Equipment Utilized During Treatment: Gait belt Activity Tolerance: Patient tolerated treatment well Patient left: in chair;with call bell/phone within reach Nurse Communication: Mobility status   Zahira Brummond, OTR/L Pager [phone] 03/30/2012, 11:12 AM

## 2012-03-30 NOTE — Progress Notes (Signed)
Subjective: abd  Pain is better controlled.  Objective: Weight change: 0.794 kg (1 lb 12 oz)  Intake/Output Summary (Last 24 hours) at 03/30/12 1122 Last data filed at 03/30/12 1009  Gross per 24 hour  Intake 2372.5 ml  Output    900 ml  Net 1472.5 ml    Filed Vitals:   03/30/12 1000  BP: 160/63  Pulse: 84  Temp: 98.8 F (37.1 C)  Resp: 18   Physical Exam:  General: No acute respiratory distress  Lungs: Clear to auscultation bilaterally without wheezes or crackles  Cardiovascular: Regular rate and rhythm without murmur gallop or rub normal S1 and S2  Abdomen: Nontender, nondistended, soft, bowel sounds positive, no rebound, no ascites, no appreciable mass - lap wounds dressed and dry  Extremities: No significant cyanosis, clubbing, or edema bilateral lower extremities   Lab Results: Results for orders placed during the hospital encounter of 03/24/12 (from the past 24 hour(s))  GLUCOSE, CAPILLARY     Status: Abnormal   Collection Time   03/29/12  8:37 PM      Component Value Range   Glucose-Capillary 167 (*) 70 - 99 (mg/dL)  GLUCOSE, CAPILLARY     Status: Normal   Collection Time   03/30/12 12:51 AM      Component Value Range   Glucose-Capillary 90  70 - 99 (mg/dL)  GLUCOSE, CAPILLARY     Status: Normal   Collection Time   03/30/12  4:26 AM      Component Value Range   Glucose-Capillary 91  70 - 99 (mg/dL)  GLUCOSE, CAPILLARY     Status: Abnormal   Collection Time   03/30/12  7:32 AM      Component Value Range   Glucose-Capillary 110 (*) 70 - 99 (mg/dL)   Comment 1 Documented in Chart     Comment 2 Notify RN    GLUCOSE, CAPILLARY     Status: Abnormal   Collection Time   03/30/12 11:43 AM      Component Value Range   Glucose-Capillary 145 (*) 70 - 99 (mg/dL)   Comment 1 Documented in Chart     Comment 2 Notify RN    GLUCOSE, CAPILLARY     Status: Abnormal   Collection Time   03/30/12  4:41 PM      Component Value Range   Glucose-Capillary 135 (*) 70 - 99  (mg/dL)   Comment 1 Documented in Chart     Comment 2 Notify RN       Micro Results: Recent Results (from the past 240 hour(s))  URINE CULTURE     Status: Normal   Collection Time   03/24/12  9:55 PM      Component Value Range Status Comment   Specimen Description URINE, CATHETERIZED   Final    Special Requests Normal   Final    Culture  Setup Time [id number]   Final    Colony Count NO GROWTH   Final    Culture NO GROWTH   Final    Report Status 03/25/2012 FINAL   Final   CULTURE, BLOOD (ROUTINE X 2)     Status: Normal (Preliminary result)   Collection Time   03/24/12 10:00 PM      Component Value Range Status Comment   Specimen Description BLOOD HAND RIGHT   Final    Special Requests BOTTLES DRAWN AEROBIC ONLY 4CC   Final    Culture  Setup Time [id number]   Final    Culture  Final    Value:        BLOOD CULTURE RECEIVED NO GROWTH TO DATE CULTURE WILL BE HELD FOR 5 DAYS BEFORE ISSUING A FINAL NEGATIVE REPORT   Report Status PENDING   Incomplete   CULTURE, BLOOD (ROUTINE X 2)     Status: Normal (Preliminary result)   Collection Time   03/24/12 10:30 PM      Component Value Range Status Comment   Specimen Description BLOOD ARM RIGHT   Final    Special Requests BOTTLES DRAWN AEROBIC ONLY 1CC   Final    Culture  Setup Time [id number]   Final    Culture     Final    Value:        BLOOD CULTURE RECEIVED NO GROWTH TO DATE CULTURE WILL BE HELD FOR 5 DAYS BEFORE ISSUING A FINAL NEGATIVE REPORT   Report Status PENDING   Incomplete   ANAEROBIC CULTURE     Status: Normal   Collection Time   03/25/12  3:32 AM      Component Value Range Status Comment   Specimen Description FLUID PERITONEAL   Final    Special Requests NONE ON SWAB PATIENT ON FOLLOWING ZOSYN   Final    Gram Stain     Final    Value: FEW WBC PRESENT,BOTH PMN AND MONONUCLEAR     NO ORGANISMS SEEN   Culture NO ANAEROBES ISOLATED   Final    Report Status 03/29/2012 FINAL   Final   BODY FLUID CULTURE     Status:  Normal   Collection Time   03/25/12  3:32 AM      Component Value Range Status Comment   Specimen Description FLUID PERITONEAL   Final    Special Requests NONE ON SWAB PATIENT ON FOLLOWING ZOSYN   Final    Gram Stain     Final    Value: FEW WBC PRESENT,BOTH PMN AND MONONUCLEAR     NO ORGANISMS SEEN   Culture     Final    Value: RARE VIRIDANS STREPTOCOCCUS     FEW HAEMOPHILUS PARAINFLUENZAE     FEW CANDIDA TROPICALIS     Note: BETA LACTAMASE NEGATIVE     Note: CRITICAL RESULT CALLED TO, READ BACK BY AND VERIFIED WITH: RN A. MINTZ ON 03/27/12 BY DTERRY   Report Status 03/30/2012 FINAL   Final    Organism ID, Bacteria VIRIDANS STREPTOCOCCUS   Final   MRSA PCR SCREENING     Status: Normal   Collection Time   03/25/12  4:19 AM      Component Value Range Status Comment   MRSA by PCR NEGATIVE  NEGATIVE  Final     Studies/Results: Ct Abdomen Pelvis Wo Contrast  03/25/2012  *RADIOLOGY REPORT*  Clinical Data: Abdominal pain.  Sepsis.  Pneumoperitoneum on x-ray earlier.  Acute renal failure.  CT ABDOMEN AND PELVIS WITHOUT CONTRAST 03/25/2012:  Technique:  Multidetector CT imaging of the abdomen and pelvis was performed following the standard protocol without intravenous contrast.  Comparison: CT abdomen and pelvis 12/30/2008.  Findings: Moderate pneumoperitoneum and moderate ascites throughout the abdomen and pelvis.  Etiology of the gas-filled to most likely be in the stomach, as there may be intramural gas in the wall of the proximal body of the stomach.  Small bowel normal in appearance.  Large stool burden throughout decompressed colon. Diffuse colonic diverticulosis, though no extraluminal gas adjacent to the colon to suggest this as the etiology.  Focal circumferential wall thickening  involving the mid transverse colon in an area of collapsed bowel.  Normal unenhanced appearance of the liver, spleen, and adrenal glands.  Numerous cysts in both kidneys, some of which have calcifications in their  walls, with the most calcification involving the wall of a left lower pole cyst.  Within the limits of the unenhanced technique, no definite solid renal masses.  No urinary tract calculi.  No hydronephrosis.  Gallbladder unremarkable by CT.  No biliary ductal dilation. Extensive aorto-iliofemoral atherosclerosis without aneurysm.  No significant lymphadenopathy.  Uterus atrophic consistent with age. No visible adnexal masses.  Phleboliths low in the pelvis.  Urinary bladder decompressed by Foley catheter.  Small right pleural effusion and associated mild passive atelectasis in the right lower lobe.  Heart enlarged.  Bone window images demonstrate severe degenerative changes involving the lumbar spine.  IMPRESSION:  1.  Moderate pneumoperitoneum and moderate ascites.  Etiology felt to most likely be in the stomach (gastric ulcer?) 2.  Diffuse colonic diverticulosis.  Focal circumferential wall thickening involving the mid transverse colon may just be due to the fact that this is the collapse segment.  A colonic mass is not entirely excluded, however. 3.  Large bilateral renal cysts, some of which have calcification in their walls because of the thickness of the calcification of the cyst in the lower pole of the left kidney, this would be Cone centered a Bosniak category II F lesion.  The remaining cysts are Bosniak category II. 4.  Peritoneal carcinomatosis is suspected. 5.  Small right pleural effusion and associated passive atelectasis or pneumonia in the right lower lobe.  Critical results were discussed directly with Dr. Michaell Cowing of general surgery at the time of interpretation on 03/25/2012 at 0040 hours.  Original Report Authenticated By: Arnell Sieving, M.D.   Ct Head Wo Contrast  03/24/2012  *RADIOLOGY REPORT*  Clinical Data: Acute mental status changes with lethargy.  CT HEAD WITHOUT CONTRAST  Technique:  Contiguous axial images were obtained from the base of the skull through the vertex without  contrast.  Comparison: MRI brain 01/15/2010, 06/06/2008.  Unenhanced cranial CT 01/13/2010.  Findings: Moderate cortical and deep atrophy, mild cerebellar atrophy, and severe changes of small vessel disease of the white matter diffusely, unchanged.  Physiologic calcifications in the right basal ganglia, unchanged. No mass lesion.  No midline shift. No acute hemorrhage or hematoma.  No extra-axial fluid collections. No evidence of acute infarction.  No significant interval change.  Severe changes of hyperostosis frontalis interna bilaterally. Visualized paranasal sinuses, mastoid air cells, and middle ear cavities well-aerated.  Severe bilateral carotid siphon and vertebral artery atherosclerosis.  IMPRESSION:  1.  No acute intracranial abnormality. 2.  Stable moderate generalized atrophy and severe chronic microvascular ischemic changes of the white matter diffusely.  Original Report Authenticated By: Arnell Sieving, M.D.   Dg Chest Port 1 View  03/27/2012  *RADIOLOGY REPORT*  Clinical Data: Follow up respiratory failure  PORTABLE CHEST - 1 VIEW  Comparison: 03/26/2012  Findings: Cardiomegaly.  Mild patchy right perihilar opacity, possibly atelectasis or asymmetric edema.  Early/developing pneumonia not excluded.  Patchy bibasilar opacities, likely atelectasis, with suspected small left pleural effusion.  No pneumothorax.  Enteric tube coursing below the diaphragm.  Stable right IJ venous catheter.  Subphrenic drain on the right.  IMPRESSION: Mild patchy right perihilar opacity, possibly atelectasis or asymmetric edema. Early pneumonia not excluded.  Bibasilar atelectasis with suspected small left pleural effusion.  Cardiomegaly.  Original Report Authenticated By: Charline Bills,  M.D.   Dg Chest Port 1 View  03/26/2012  *RADIOLOGY REPORT*  Clinical Data: Endotracheal tube placement  PORTABLE CHEST - 1 VIEW  Comparison: Yesterday  Findings: Normal heart size.  Endotracheal tube, NG tube, right internal  jugular vein center venous catheter are stable. Increasing haziness at both lung bases likely a combination of pleural fluid and volume loss.  IMPRESSION: Increasing haziness at both lung bases as described.  Original Report Authenticated By: Donavan Burnet, M.D.   Portable Chest Xray  03/25/2012  *RADIOLOGY REPORT*  Clinical Data:  Evaluate tube position.  PORTABLE CHEST - 1 VIEW  Comparison:  03/25/2012  Findings: Endotracheal tube tip is located 4.5 cm proximal to the carina.  Right IJ catheter tip projects over the mid SVC.  Stable cardiomediastinal contours.  Aortic arch atherosclerosis. Bibasilar opacities.  No pneumothorax.  Cannot exclude small pleural effusions.  Osteopenia and multilevel degenerative changes. NG tube side port is at the GE junction and therefore should be advanced.  IMPRESSION: Endotracheal tube tip appropriately positioned.  NG tube side port is at the GE junction and therefore should be advanced.  Bibasilar opacities.  Original Report Authenticated By: Waneta Martins, M.D.   Dg Chest Portable 1 View  03/25/2012  *RADIOLOGY REPORT*  Clinical Data: Central venous catheter placement.  PORTABLE CHEST - 1 VIEW 03/25/2012 0114 hours:  Comparison: Portable chest x-ray yesterday.  Two-view chest x-ray 01/13/2010.  Findings: Right jugular central venous catheter tip in the SVC.  No evidence of pneumothorax or mediastinal hematoma.  Suboptimal inspiration with atelectasis in the lung bases.  Cardiac silhouette mildly enlarged but stable.  Pulmonary vascularity normal without evidence pulmonary edema.  IMPRESSION:  1.  Right jugular central venous catheter tip in the SVC.  No acute complicating features. 2.  Suboptimal inspiration accounts for bibasilar atelectasis.  No acute cardiopulmonary disease otherwise.  Stable cardiomegaly without pulmonary edema.  Original Report Authenticated By: Arnell Sieving, M.D.   Dg Chest Portable 1 View  03/24/2012  *RADIOLOGY REPORT*  Clinical  Data: Altered mental status, hypertension  PORTABLE CHEST - 1 VIEW  Comparison: 01/13/2010  Findings: Cardiomegaly.  Central vascular congestion. Aortic arch atherosclerosis.  No overt edema or pneumothorax.  Hemidiaphragm elevation obscures the lung bases.  Small effusions not excluded. Mild lung base opacities.  Osteopenia.  Shoulder degenerative changes and multilevel degenerative changes of the thoracic vertebrae.  IMPRESSION: Prominent cardiomediastinal contours, similar to prior.  Mild lung base opacities; atelectasis versus infiltrate.  Original Report Authenticated By: Waneta Martins, M.D.   Dg Abd 2 Views  03/24/2012  *RADIOLOGY REPORT*  Clinical Data: Abdominal pain and tenderness.  ABDOMEN - 2 VIEW  Comparison: CT abdomen pelvis 12/30/2008.  Findings: Bowel gas pattern unremarkable without evidence of obstruction or significant ileus.  Free intraperitoneal air identified adjacent to the liver on the lateral decubitus view. Aorto-iliac atherosclerosis.  No visible opaque urinary tract calculi.  Osteopenia, severe degenerative changes involving the lumbar spine, and severe degenerative changes involving the right sacroiliac joint and both hips.  IMPRESSION: Pneumoperitoneum.  Critical Value/emergent results were called by telephone at the time of interpretation on 03/24/2012  at 2315 hours  to  Dr. Oletta Lamas of the emergency department, who verbally acknowledged these results.  Original Report Authenticated By: Arnell Sieving, M.D.   Dg Kayleen Memos W/water Sol Cm  03/28/2012  *RADIOLOGY REPORT*  Clinical Data:  Perforated pyloric channel ulcer.  UPPER GI SERIES WITH KUB  Technique:  Routine upper GI series was  performed with water- soluble contrast.  Fluoroscopy Time: 3.26 minutes slow pulsed fluoroscopy  Comparison:  CT scan dated 03/25/2012  Findings:  KUB demonstrates a normal bowel gas pattern.  Calcifications in a cyst in the lower pole of the left kidney.  The patient ingested water-soluble contrast  which passed immediately through the pylorus and into the duodenal bulb and C- loop.  The patient does have a small hiatal hernia with spontaneous gastroesophageal reflux.  There is a diverticulum in the second portion of the duodenum.  There is marked deformity of the pyloric channel with what appears to be the remnant of the large pyloric channel ulcer.  IMPRESSION: No evidence of extravasation.  There is a deformity with what appears to be a prominent ulcer crater along the greater curvature of the pylorus.  No obstruction.  Original Report Authenticated By: Gwynn Burly, M.D.   Medications: Scheduled Meds:   . bisacodyl  10 mg Rectal Daily  . feeding supplement  237 mL Oral Q24H  . fluconazole (DIFLUCAN) IV  400 mg Intravenous Q24H  . heparin  5,000 Units Subcutaneous Q8H  . insulin aspart  0-9 Units Subcutaneous Q4H  . levothyroxine  26 mcg Intravenous Daily  . pantoprazole (PROTONIX) IV  40 mg Intravenous Q12H  . piperacillin-tazobactam (ZOSYN)  IV  3.375 g Intravenous Q8H  . saccharomyces boulardii  250 mg Oral BID  . DISCONTD: fluconazole (DIFLUCAN) IV  200 mg Intravenous Q24H  . DISCONTD: white petrolatum   Topical BID   Continuous Infusions:   . dexrose 5 % and 0.45 % NaCl with KCl 30 mEq/L 50 mL/hr at 03/30/12 0000   PRN Meds:.acetaminophen, fentaNYL, labetalol, ondansetron (ZOFRAN) IV  Assessment/Plan: Patient Active Hospital Problem List:  Peritonitis s/p perforated pyloric ulcer - OR 5/24 for laparoscopic repair w/ omental patch  Improving. - diet being advanced by surgery .  Acute renal failure  Due to sepsis/hypotension - resolved  Lactic Acidosis  Due to above - resolved  Hypokalemia  Replace with goal of 4.0 or > - Mg is borderline so will also supplement Mg  Anemia of critical illness  Hgb beginning to improve  HTN  Uncontrolled - adjust tx and follow trend  DM  Reasonably controlled at the present time  Diastolic CHF via echo 2011  No clinical evidence  of volume overload  hypothyroidism  Remains on synthroid  Dispo  Mobilize - PT/OT - pt is highly motivated to d/c home soon.   LOS: 6 days   Lawan Nanez 03/30/2012, 11:22 AM

## 2012-03-30 NOTE — Progress Notes (Signed)
Doing much better. Advance diet.  Hopefully home tomorrow.

## 2012-03-31 ENCOUNTER — Inpatient Hospital Stay (HOSPITAL_COMMUNITY): Payer: Medicare Other

## 2012-03-31 DIAGNOSIS — N170 Acute kidney failure with tubular necrosis: Secondary | ICD-10-CM

## 2012-03-31 DIAGNOSIS — I959 Hypotension, unspecified: Secondary | ICD-10-CM

## 2012-03-31 DIAGNOSIS — K650 Generalized (acute) peritonitis: Secondary | ICD-10-CM

## 2012-03-31 LAB — CBC
HCT: 30.9 % — ABNORMAL LOW (ref 36.0–46.0)
MCH: 28.2 pg (ref 26.0–34.0)
MCH: 28.9 pg (ref 26.0–34.0)
MCHC: 33.7 g/dL (ref 30.0–36.0)
MCV: 85.4 fL (ref 78.0–100.0)
MCV: 85.8 fL (ref 78.0–100.0)
Platelets: 136 10*3/uL — ABNORMAL LOW (ref 150–400)
Platelets: 215 10*3/uL (ref 150–400)
RBC: 3.62 MIL/uL — ABNORMAL LOW (ref 3.87–5.11)
RDW: 14.8 % (ref 11.5–15.5)
WBC: 11.4 10*3/uL — ABNORMAL HIGH (ref 4.0–10.5)

## 2012-03-31 LAB — GLUCOSE, CAPILLARY
Glucose-Capillary: 90 mg/dL (ref 70–99)
Glucose-Capillary: 116 mg/dL — ABNORMAL HIGH (ref 70–99)
Glucose-Capillary: 139 mg/dL — ABNORMAL HIGH (ref 70–99)

## 2012-03-31 LAB — BASIC METABOLIC PANEL
BUN: 8 mg/dL (ref 6–23)
CO2: 25 mEq/L (ref 19–32)
Calcium: 8.5 mg/dL (ref 8.4–10.5)
Chloride: 103 mEq/L (ref 96–112)
Creatinine, Ser: 0.71 mg/dL (ref 0.50–1.10)

## 2012-03-31 LAB — CULTURE, BLOOD (ROUTINE X 2)
Culture  Setup Time: 201305240344
Culture  Setup Time: 201305240345
Culture: NO GROWTH
Culture: NO GROWTH

## 2012-03-31 MED ORDER — SACCHAROMYCES BOULARDII 250 MG PO CAPS: 250.0000 mg | ORAL_CAPSULE | Freq: Two times a day (BID) | ORAL | Status: AC

## 2012-03-31 MED ORDER — LEVOFLOXACIN 750 MG PO TABS: 750.0000 mg | ORAL_TABLET | Freq: Every day | ORAL | Status: AC

## 2012-03-31 MED ORDER — LEVOFLOXACIN 750 MG PO TABS
750.0000 mg | ORAL_TABLET | Freq: Every day | ORAL | Status: DC
  Administered 2012-03-31 – 2012-04-01 (×2): 750 mg via ORAL
  Filled 2012-03-31 (×2): qty 1

## 2012-03-31 MED ORDER — FLUCONAZOLE 200 MG PO TABS: 400.0000 mg | ORAL_TABLET | Freq: Every day | ORAL | Status: AC

## 2012-03-31 MED ORDER — ENSURE COMPLETE PO LIQD: 237.0000 mL | ORAL | Status: DC

## 2012-03-31 MED ORDER — LEVOTHYROXINE SODIUM 50 MCG PO TABS
50.0000 ug | ORAL_TABLET | Freq: Every day | ORAL | Status: DC
  Administered 2012-04-01: 50 ug via ORAL
  Filled 2012-03-31 (×2): qty 1

## 2012-03-31 MED ORDER — PANTOPRAZOLE SODIUM 40 MG PO TBEC: 40.0000 mg | DELAYED_RELEASE_TABLET | Freq: Every day | ORAL | Status: DC

## 2012-03-31 NOTE — Progress Notes (Signed)
Physical Therapy Treatment Patient Details Name: Megan [name] MRN: [id number] DOB: [birth year] Today's Date: 03/31/2012 Time: 4098-1191 PT Time Calculation (min): 24 min  PT Assessment / Plan / Recommendation Comments on Treatment Session  pt s/p lap sx for perforated bowel.  Pt making good progress ,but remains weak and is safest with RW.     Follow Up Recommendations  Home health PT    Barriers to Discharge        Equipment Recommendations  None recommended by PT    Recommendations for Other Services    Frequency Min 3X/week   Plan Discharge plan remains appropriate    Precautions / Restrictions Precautions Precautions: Fall   Pertinent Vitals/Pain     Mobility  Bed Mobility Bed Mobility: Sit to Supine Sit to Supine: 5: Supervision Transfers Transfers: Sit to Stand;Stand to Sit Sit to Stand: 5: Supervision Stand to Sit: 5: Supervision Details for Transfer Assistance: reinforced hand placement for safety Ambulation/Gait Ambulation/Gait Assistance: 4: Min guard Ambulation Distance (Feet): 180 Feet Assistive device: Rolling walker Ambulation/Gait Assistance Details: generally steady with RW, less so for the 15 feet pt walked with RW. Gait Pattern: Step-through pattern;Decreased step length - right;Decreased step length - left;Decreased stride length    Exercises     PT Diagnosis:    PT Problem List:   PT Treatment Interventions:     PT Goals Acute Rehab PT Goals PT Goal Formulation: With patient Time For Goal Achievement: 03/29/12 Potential to Achieve Goals: Good PT Goal: Supine/Side to Sit - Progress: Progressing toward goal PT Goal: Sit to Stand - Progress: Progressing toward goal PT Goal: Ambulate - Progress: Progressing toward goal  Visit Information  Last PT Received On: 03/31/12 Assistance Needed: +1    Subjective Data  Subjective: I don't need this walker, but it's helping me out Patient Stated Goal: Back home independent   Cognition  Overall  Cognitive Status: Appears within functional limits for tasks assessed/performed Arousal/Alertness: Awake/alert Orientation Level: Oriented X4 / Intact Behavior During Session: [facility] for tasks performed    Balance  Static Sitting Balance Static Sitting - Balance Support: No upper extremity supported;Feet supported Static Sitting - Level of Assistance: 7: Independent  End of Session PT - End of Session Activity Tolerance: Patient tolerated treatment well Patient left: with call bell/phone within reach (EOB) Nurse Communication: Mobility status    Dorse Locy, Eliseo Gum 03/31/2012, 1:51 PM  03/31/2012  [place] Bing, PT [phone] (pager)

## 2012-03-31 NOTE — Progress Notes (Signed)
Patient ID: Megan [name], female   DOB: December 21, 1926, 78 y.o.   MRN: [id number] 7 Days Post-Op  Subjective: Pt feels well.  Tolerating a regular diet.  No c/o pain  Objective: Vital signs in last 24 hours: Temp:  [97.8 F (36.6 C)-99.8 F (37.7 C)] 98.5 F (36.9 C) (05/30 0500) Pulse Rate:  [78-92] 80  (05/30 0500) Resp:  [17-20] 17  (05/30 0500) BP: (160-179)/(63-78) 173/64 mmHg (05/30 0500) SpO2:  [95 %-100 %] 96 % (05/30 0500) Weight:  [157 lb (71.215 kg)] 157 lb (71.215 kg) (05/29 2028) Last BM Date: 03/29/12  Intake/Output from previous day: 05/29 0701 - 05/30 0700 In: 2138.3 [P.O.:960; I.V.:728.3; IV Piggyback:250] Out: 1750 [Urine:1750] Intake/Output this shift:    PE: Abd: soft, Nt, ND, +BS, JP drains with serous output only, no bile.  These were both removed at the bedside.  The patient tolerated this well.  Lab Results:   Basename 03/31/12 0625 03/29/12 0518  WBC 11.4* 8.3  HGB 10.2* 9.9*  HCT 30.9* 29.5*  PLT 136* 147*   BMET  Basename 03/31/12 0625 03/29/12 0518  NA 138 138  K 4.3 3.5  CL 103 107  CO2 25 23  GLUCOSE 105* 98  BUN 8 8  CREATININE 0.71 0.77  CALCIUM 8.5 8.2*   PT/INR No results found for this basename: LABPROT:2,INR:2 in the last 72 hours CMP     Component Value Date/Time   NA 138 03/31/2012 0625   K 4.3 03/31/2012 0625   CL 103 03/31/2012 0625   CO2 25 03/31/2012 0625   GLUCOSE 105* 03/31/2012 0625   BUN 8 03/31/2012 0625   CREATININE 0.71 03/31/2012 0625   CALCIUM 8.5 03/31/2012 0625   PROT 5.5* 03/24/2012 2200   ALBUMIN 2.3* 03/24/2012 2200   AST 24 03/24/2012 2200   ALT 7 03/24/2012 2200   ALKPHOS 39 03/24/2012 2200   BILITOT 1.0 03/24/2012 2200   GFRNONAA 78* 03/31/2012 0625   GFRAA >90 03/31/2012 0625   Lipase     Component Value Date/Time   LIPASE 13 11/01/2007 2208       Studies/Results: No results found.  Anti-infectives: Anti-infectives     Start     Dose/Rate Route Frequency Ordered Stop   03/30/12 0800    fluconazole (DIFLUCAN) IVPB 400 mg        400 mg 200 mL/hr over 60 Minutes Intravenous Every 24 hours 03/29/12 1218     03/27/12 1530   fluconazole (DIFLUCAN) IVPB 200 mg  Status:  Discontinued        200 mg 100 mL/hr over 60 Minutes Intravenous Every 24 hours 03/27/12 1527 03/27/12 1534   03/26/12 1400  piperacillin-tazobactam (ZOSYN) IVPB 3.375 g       3.375 g 12.5 mL/hr over 240 Minutes Intravenous 3 times per day 03/26/12 1252     03/26/12 0600   fluconazole (DIFLUCAN) IVPB 200 mg  Status:  Discontinued        200 mg 100 mL/hr over 60 Minutes Intravenous Every 24 hours 03/25/12 0406 03/29/12 1218   03/25/12 0600   piperacillin-tazobactam (ZOSYN) IVPB 2.25 g  Status:  Discontinued        2.25 g 100 mL/hr over 30 Minutes Intravenous 4 times per day 03/25/12 0406 03/26/12 1252   03/25/12 0415   piperacillin-tazobactam (ZOSYN) IVPB 3.375 g  Status:  Discontinued        3.375 g 100 mL/hr over 30 Minutes Intravenous  Once 03/25/12 0406 03/25/12 0452  03/25/12 0145   gentamycin 80 mg in 0.9% normal saline 250 mL irrigation  Status:  Discontinued        80 mg Irrigation Every 5 min 03/25/12 0122 03/25/12 0406   03/25/12 0145   clindamycin (CLEOCIN) IVPB 900 mg        900 mg 100 mL/hr over 30 Minutes Intravenous  Once 03/25/12 0142 03/25/12 0652   03/25/12 0130   clindamycin (CLEOCIN) injection 900 mg  Status:  Discontinued        900 mg Intravenous  Once 03/25/12 0126 03/25/12 0141   03/25/12 0100   fluconazole (DIFLUCAN) IVPB 200 mg        200 mg 100 mL/hr over 60 Minutes Intravenous  Once 03/25/12 0051 03/25/12 0735   03/24/12 2345  piperacillin-tazobactam (ZOSYN) IVPB 3.375 g       3.375 g 100 mL/hr over 30 Minutes Intravenous  Once 03/24/12 2341 03/25/12 0015           Assessment/Plan  1. S/p lap repair of perforated pyloric channel gastric ulcer  Plan: 1. Patient is surgically stable for discharge home.  JP drains were removed. 2. Follow up with Dr. Michaell Cowing in 3  weeks. 3. Please call as needed   LOS: 7 days    Antionette Luster E 03/31/2012

## 2012-03-31 NOTE — Progress Notes (Signed)
Nutrition Follow-up  JP drains removed. Pt advanced to Regular diet and tolerating well. Weight is stable. Pt states she doesn't like the food here, but is able to tolerate what she is willing to eat. Is drinking Ensure daily.  Diet Order:  Regular with thin liquids Supplements: Ensure Complete PO daily  Meds: Scheduled Meds:   . bisacodyl  10 mg Rectal Daily  . feeding supplement  237 mL Oral Q24H  . fluconazole (DIFLUCAN) IV  400 mg Intravenous Q24H  . heparin  5,000 Units Subcutaneous Q8H  . insulin aspart  0-9 Units Subcutaneous Q4H  . levothyroxine  26 mcg Intravenous Daily  . pantoprazole  40 mg Oral BID AC  . piperacillin-tazobactam (ZOSYN)  IV  3.375 g Intravenous Q8H  . saccharomyces boulardii  250 mg Oral BID  . DISCONTD: pantoprazole (PROTONIX) IV  40 mg Intravenous Q12H   Continuous Infusions:   . dexrose 5 % and 0.45 % NaCl with KCl 30 mEq/L 50 mL/hr at 03/31/12 0700   PRN Meds:.acetaminophen, fentaNYL, labetalol, ondansetron (ZOFRAN) IV, traMADol, DISCONTD: acetaminophen  Labs:  CMP     Component Value Date/Time   NA 138 03/31/2012 0625   K 4.3 03/31/2012 0625   CL 103 03/31/2012 0625   CO2 25 03/31/2012 0625   GLUCOSE 105* 03/31/2012 0625   BUN 8 03/31/2012 0625   CREATININE 0.71 03/31/2012 0625   CALCIUM 8.5 03/31/2012 0625   PROT 5.5* 03/24/2012 2200   ALBUMIN 2.3* 03/24/2012 2200   AST 24 03/24/2012 2200   ALT 7 03/24/2012 2200   ALKPHOS 39 03/24/2012 2200   BILITOT 1.0 03/24/2012 2200   GFRNONAA 78* 03/31/2012 0625   GFRAA >90 03/31/2012 0625   Phosphorus  Date/Time Value Range Status  03/28/2012  6:10 AM 1.7* 2.3-4.6 (mg/dL) Final   Magnesium  Date/Time Value Range Status  03/29/2012  5:18 AM 2.1  1.5-2.5 (mg/dL) Final     Intake/Output Summary (Last 24 hours) at 03/31/12 1248 Last data filed at 03/31/12 0900  Gross per 24 hour  Intake 1698.34 ml  Output   2100 ml  Net -401.66 ml  BM on 5/28  Weight Status:  71.2 kg - wt stable  Estimated needs:   1550 - 1750 kcal, 85 - 95 grams protein  Nutrition Dx: Inadequate oral intake now r/t food preferences AEB pt report and variable meal completion  Goal:  Meet 90 - 100% of estimated nutrition needs. Progressing  Intervention:  Continue Ensure supplements to support variable PO intake.  Monitor:  Weights, labs, PO intake, I/O's  Adair Laundry Pager #:  [phone]

## 2012-03-31 NOTE — Progress Notes (Signed)
Doing well.  Going home.   

## 2012-03-31 NOTE — Progress Notes (Signed)
Subjective: Mild abd pain.  Objective: Weight change: -1.179 kg (-2 lb 9.6 oz)  Intake/Output Summary (Last 24 hours) at 03/31/12 2000 Last data filed at 03/31/12 1700  Gross per 24 hour  Intake 671.67 ml  Output   1050 ml  Net -378.33 ml    Filed Vitals:   03/31/12 1759  BP: 170/68  Pulse: 78  Temp: 98.2 F (36.8 C)  Resp: 20   Physical Exam:  General: No acute respiratory distress  Lungs: Clear to auscultation bilaterally without wheezes or crackles  Cardiovascular: Regular rate and rhythm without murmur gallop or rub normal S1 and S2  Abdomen: Nontender, nondistended, soft, bowel sounds positive, no rebound, no ascites, no appreciable mass - lap wounds dressed and dry  Extremities: No significant cyanosis, clubbing, or edema bilateral lower extremities   Lab Results: Results for orders placed during the hospital encounter of 03/24/12 (from the past 24 hour(s))  GLUCOSE, CAPILLARY     Status: Abnormal   Collection Time   03/30/12  8:22 PM      Component Value Range   Glucose-Capillary 163 (*) 70 - 99 (mg/dL)  GLUCOSE, CAPILLARY     Status: Abnormal   Collection Time   03/30/12 11:52 PM      Component Value Range   Glucose-Capillary 120 (*) 70 - 99 (mg/dL)  GLUCOSE, CAPILLARY     Status: Normal   Collection Time   03/31/12  3:57 AM      Component Value Range   Glucose-Capillary 90  70 - 99 (mg/dL)  CBC     Status: Abnormal   Collection Time   03/31/12  6:25 AM      Component Value Range   WBC 11.4 (*) 4.0 - 10.5 (K/uL)   RBC 3.62 (*) 3.87 - 5.11 (MIL/uL)   Hemoglobin 10.2 (*) 12.0 - 15.0 (g/dL)   HCT 16.1 (*) 09.6 - 46.0 (%)   MCV 85.4  78.0 - 100.0 (fL)   MCH 28.2  26.0 - 34.0 (pg)   MCHC 33.0  30.0 - 36.0 (g/dL)   RDW 04.5  40.9 - 81.1 (%)   Platelets 136 (*) 150 - 400 (K/uL)  BASIC METABOLIC PANEL     Status: Abnormal   Collection Time   03/31/12  6:25 AM      Component Value Range   Sodium 138  135 - 145 (mEq/L)   Potassium 4.3  3.5 - 5.1 (mEq/L)   Chloride 103  96 - 112 (mEq/L)   CO2 25  19 - 32 (mEq/L)   Glucose, Bld 105 (*) 70 - 99 (mg/dL)   BUN 8  6 - 23 (mg/dL)   Creatinine, Ser 9.14  0.50 - 1.10 (mg/dL)   Calcium 8.5  8.4 - 78.2 (mg/dL)   GFR calc non Af Amer 78 (*) >90 (mL/min)   GFR calc Af Amer >90  >90 (mL/min)  GLUCOSE, CAPILLARY     Status: Abnormal   Collection Time   03/31/12  8:26 AM      Component Value Range   Glucose-Capillary 116 (*) 70 - 99 (mg/dL)  GLUCOSE, CAPILLARY     Status: Abnormal   Collection Time   03/31/12 11:36 AM      Component Value Range   Glucose-Capillary 111 (*) 70 - 99 (mg/dL)  CBC     Status: Abnormal   Collection Time   03/31/12  3:19 PM      Component Value Range   WBC 12.7 (*) 4.0 - 10.5 (  K/uL)   RBC 3.53 (*) 3.87 - 5.11 (MIL/uL)   Hemoglobin 10.2 (*) 12.0 - 15.0 (g/dL)   HCT 16.1 (*) 09.6 - 46.0 (%)   MCV 85.8  78.0 - 100.0 (fL)   MCH 28.9  26.0 - 34.0 (pg)   MCHC 33.7  30.0 - 36.0 (g/dL)   RDW 04.5  40.9 - 81.1 (%)   Platelets 215  150 - 400 (K/uL)  GLUCOSE, CAPILLARY     Status: Abnormal   Collection Time   03/31/12  3:54 PM      Component Value Range   Glucose-Capillary 139 (*) 70 - 99 (mg/dL)     Micro Results: Recent Results (from the past 240 hour(s))  URINE CULTURE     Status: Normal   Collection Time   03/24/12  9:55 PM      Component Value Range Status Comment   Specimen Description URINE, CATHETERIZED   Final    Special Requests Normal   Final    Culture  Setup Time [id number]   Final    Colony Count NO GROWTH   Final    Culture NO GROWTH   Final    Report Status 03/25/2012 FINAL   Final   CULTURE, BLOOD (ROUTINE X 2)     Status: Normal   Collection Time   03/24/12 10:00 PM      Component Value Range Status Comment   Specimen Description BLOOD HAND RIGHT   Final    Special Requests BOTTLES DRAWN AEROBIC ONLY 4CC   Final    Culture  Setup Time [id number]   Final    Culture NO GROWTH 5 DAYS   Final    Report Status 03/31/2012 FINAL   Final   CULTURE,  BLOOD (ROUTINE X 2)     Status: Normal   Collection Time   03/24/12 10:30 PM      Component Value Range Status Comment   Specimen Description BLOOD ARM RIGHT   Final    Special Requests BOTTLES DRAWN AEROBIC ONLY 1CC   Final    Culture  Setup Time [id number]   Final    Culture NO GROWTH 5 DAYS   Final    Report Status 03/31/2012 FINAL   Final   ANAEROBIC CULTURE     Status: Normal   Collection Time   03/25/12  3:32 AM      Component Value Range Status Comment   Specimen Description FLUID PERITONEAL   Final    Special Requests NONE ON SWAB PATIENT ON FOLLOWING ZOSYN   Final    Gram Stain     Final    Value: FEW WBC PRESENT,BOTH PMN AND MONONUCLEAR     NO ORGANISMS SEEN   Culture NO ANAEROBES ISOLATED   Final    Report Status 03/29/2012 FINAL   Final   BODY FLUID CULTURE     Status: Normal   Collection Time   03/25/12  3:32 AM      Component Value Range Status Comment   Specimen Description FLUID PERITONEAL   Final    Special Requests NONE ON SWAB PATIENT ON FOLLOWING ZOSYN   Final    Gram Stain     Final    Value: FEW WBC PRESENT,BOTH PMN AND MONONUCLEAR     NO ORGANISMS SEEN   Culture     Final    Value: RARE VIRIDANS STREPTOCOCCUS     FEW HAEMOPHILUS PARAINFLUENZAE     FEW CANDIDA TROPICALIS     Note:  BETA LACTAMASE NEGATIVE     Note: CRITICAL RESULT CALLED TO, READ BACK BY AND VERIFIED WITH: RN A. MINTZ ON 03/27/12 BY DTERRY   Report Status 03/30/2012 FINAL   Final    Organism ID, Bacteria VIRIDANS STREPTOCOCCUS   Final   MRSA PCR SCREENING     Status: Normal   Collection Time   03/25/12  4:19 AM      Component Value Range Status Comment   MRSA by PCR NEGATIVE  NEGATIVE  Final     Studies/Results: Ct Abdomen Pelvis Wo Contrast  03/25/2012  *RADIOLOGY REPORT*  Clinical Data: Abdominal pain.  Sepsis.  Pneumoperitoneum on x-ray earlier.  Acute renal failure.  CT ABDOMEN AND PELVIS WITHOUT CONTRAST 03/25/2012:  Technique:  Multidetector CT imaging of the abdomen and pelvis  was performed following the standard protocol without intravenous contrast.  Comparison: CT abdomen and pelvis 12/30/2008.  Findings: Moderate pneumoperitoneum and moderate ascites throughout the abdomen and pelvis.  Etiology of the gas-filled to most likely be in the stomach, as there may be intramural gas in the wall of the proximal body of the stomach.  Small bowel normal in appearance.  Large stool burden throughout decompressed colon. Diffuse colonic diverticulosis, though no extraluminal gas adjacent to the colon to suggest this as the etiology.  Focal circumferential wall thickening involving the mid transverse colon in an area of collapsed bowel.  Normal unenhanced appearance of the liver, spleen, and adrenal glands.  Numerous cysts in both kidneys, some of which have calcifications in their walls, with the most calcification involving the wall of a left lower pole cyst.  Within the limits of the unenhanced technique, no definite solid renal masses.  No urinary tract calculi.  No hydronephrosis.  Gallbladder unremarkable by CT.  No biliary ductal dilation. Extensive aorto-iliofemoral atherosclerosis without aneurysm.  No significant lymphadenopathy.  Uterus atrophic consistent with age. No visible adnexal masses.  Phleboliths low in the pelvis.  Urinary bladder decompressed by Foley catheter.  Small right pleural effusion and associated mild passive atelectasis in the right lower lobe.  Heart enlarged.  Bone window images demonstrate severe degenerative changes involving the lumbar spine.  IMPRESSION:  1.  Moderate pneumoperitoneum and moderate ascites.  Etiology felt to most likely be in the stomach (gastric ulcer?) 2.  Diffuse colonic diverticulosis.  Focal circumferential wall thickening involving the mid transverse colon may just be due to the fact that this is the collapse segment.  A colonic mass is not entirely excluded, however. 3.  Large bilateral renal cysts, some of which have calcification in  their walls because of the thickness of the calcification of the cyst in the lower pole of the left kidney, this would be [facility] centered a Bosniak category II F lesion.  The remaining cysts are Bosniak category II. 4.  Peritoneal carcinomatosis is suspected. 5.  Small right pleural effusion and associated passive atelectasis or pneumonia in the right lower lobe.  Critical results were discussed directly with Dr. Michaell Cowing of general surgery at the time of interpretation on 03/25/2012 at 0040 hours.  Original Report Authenticated By: Arnell Sieving, M.D.   Dg Abd 1 View  03/31/2012  *RADIOLOGY REPORT*  Clinical Data: Abdominal pain and worsening leukocytosis.  Recent pneumoperitoneum and peritonitis.  ABDOMEN - 1 VIEW  Comparison: CT dated 02/24/2012.  Findings: Normal bowel gas pattern.  Curvilinear calcifications in a left renal cyst seen on the previous CT.  Sclerosis on both sides of the right sacroiliac joint.  Lumbar  spine degenerative changes and right hip degenerative changes.  No gross pneumoperitoneum.  IMPRESSION:  1.  No acute abnormality. 2.  Previously noted left renal Bosniak category II F lesion. 3.  Lumbar spine degenerative changes, right sacroiliitis and right hip degenerative changes.  Original Report Authenticated By: Darrol Angel, M.D.   Ct Head Wo Contrast  03/24/2012  *RADIOLOGY REPORT*  Clinical Data: Acute mental status changes with lethargy.  CT HEAD WITHOUT CONTRAST  Technique:  Contiguous axial images were obtained from the base of the skull through the vertex without contrast.  Comparison: MRI brain 01/15/2010, 06/06/2008.  Unenhanced cranial CT 01/13/2010.  Findings: Moderate cortical and deep atrophy, mild cerebellar atrophy, and severe changes of small vessel disease of the white matter diffusely, unchanged.  Physiologic calcifications in the right basal ganglia, unchanged. No mass lesion.  No midline shift. No acute hemorrhage or hematoma.  No extra-axial fluid collections. No  evidence of acute infarction.  No significant interval change.  Severe changes of hyperostosis frontalis interna bilaterally. Visualized paranasal sinuses, mastoid air cells, and middle ear cavities well-aerated.  Severe bilateral carotid siphon and vertebral artery atherosclerosis.  IMPRESSION:  1.  No acute intracranial abnormality. 2.  Stable moderate generalized atrophy and severe chronic microvascular ischemic changes of the white matter diffusely.  Original Report Authenticated By: Arnell Sieving, M.D.   Dg Chest Port 1 View  03/27/2012  *RADIOLOGY REPORT*  Clinical Data: Follow up respiratory failure  PORTABLE CHEST - 1 VIEW  Comparison: 03/26/2012  Findings: Cardiomegaly.  Mild patchy right perihilar opacity, possibly atelectasis or asymmetric edema.  Early/developing pneumonia not excluded.  Patchy bibasilar opacities, likely atelectasis, with suspected small left pleural effusion.  No pneumothorax.  Enteric tube coursing below the diaphragm.  Stable right IJ venous catheter.  Subphrenic drain on the right.  IMPRESSION: Mild patchy right perihilar opacity, possibly atelectasis or asymmetric edema. Early pneumonia not excluded.  Bibasilar atelectasis with suspected small left pleural effusion.  Cardiomegaly.  Original Report Authenticated By: Charline Bills, M.D.   Dg Chest Port 1 View  03/26/2012  *RADIOLOGY REPORT*  Clinical Data: Endotracheal tube placement  PORTABLE CHEST - 1 VIEW  Comparison: Yesterday  Findings: Normal heart size.  Endotracheal tube, NG tube, right internal jugular vein center venous catheter are stable. Increasing haziness at both lung bases likely a combination of pleural fluid and volume loss.  IMPRESSION: Increasing haziness at both lung bases as described.  Original Report Authenticated By: Donavan Burnet, M.D.   Portable Chest Xray  03/25/2012  *RADIOLOGY REPORT*  Clinical Data:  Evaluate tube position.  PORTABLE CHEST - 1 VIEW  Comparison:  03/25/2012  Findings:  Endotracheal tube tip is located 4.5 cm proximal to the carina.  Right IJ catheter tip projects over the mid SVC.  Stable cardiomediastinal contours.  Aortic arch atherosclerosis. Bibasilar opacities.  No pneumothorax.  Cannot exclude small pleural effusions.  Osteopenia and multilevel degenerative changes. NG tube side port is at the GE junction and therefore should be advanced.  IMPRESSION: Endotracheal tube tip appropriately positioned.  NG tube side port is at the GE junction and therefore should be advanced.  Bibasilar opacities.  Original Report Authenticated By: Waneta Martins, M.D.   Dg Chest Portable 1 View  03/25/2012  *RADIOLOGY REPORT*  Clinical Data: Central venous catheter placement.  PORTABLE CHEST - 1 VIEW 03/25/2012 0114 hours:  Comparison: Portable chest x-ray yesterday.  Two-view chest x-ray 01/13/2010.  Findings: Right jugular central venous catheter tip in the  SVC.  No evidence of pneumothorax or mediastinal hematoma.  Suboptimal inspiration with atelectasis in the lung bases.  Cardiac silhouette mildly enlarged but stable.  Pulmonary vascularity normal without evidence pulmonary edema.  IMPRESSION:  1.  Right jugular central venous catheter tip in the SVC.  No acute complicating features. 2.  Suboptimal inspiration accounts for bibasilar atelectasis.  No acute cardiopulmonary disease otherwise.  Stable cardiomegaly without pulmonary edema.  Original Report Authenticated By: Arnell Sieving, M.D.   Dg Chest Portable 1 View  03/24/2012  *RADIOLOGY REPORT*  Clinical Data: Altered mental status, hypertension  PORTABLE CHEST - 1 VIEW  Comparison: 01/13/2010  Findings: Cardiomegaly.  Central vascular congestion. Aortic arch atherosclerosis.  No overt edema or pneumothorax.  Hemidiaphragm elevation obscures the lung bases.  Small effusions not excluded. Mild lung base opacities.  Osteopenia.  Shoulder degenerative changes and multilevel degenerative changes of the thoracic vertebrae.   IMPRESSION: Prominent cardiomediastinal contours, similar to prior.  Mild lung base opacities; atelectasis versus infiltrate.  Original Report Authenticated By: Waneta Martins, M.D.   Dg Abd 2 Views  03/24/2012  *RADIOLOGY REPORT*  Clinical Data: Abdominal pain and tenderness.  ABDOMEN - 2 VIEW  Comparison: CT abdomen pelvis 12/30/2008.  Findings: Bowel gas pattern unremarkable without evidence of obstruction or significant ileus.  Free intraperitoneal air identified adjacent to the liver on the lateral decubitus view. Aorto-iliac atherosclerosis.  No visible opaque urinary tract calculi.  Osteopenia, severe degenerative changes involving the lumbar spine, and severe degenerative changes involving the right sacroiliac joint and both hips.  IMPRESSION: Pneumoperitoneum.  Critical Value/emergent results were called by telephone at the time of interpretation on 03/24/2012  at 2315 hours  to  Dr. Oletta Lamas of the emergency department, who verbally acknowledged these results.  Original Report Authenticated By: Arnell Sieving, M.D.   Dg Kayleen Memos W/water Sol Cm  03/28/2012  *RADIOLOGY REPORT*  Clinical Data:  Perforated pyloric channel ulcer.  UPPER GI SERIES WITH KUB  Technique:  Routine upper GI series was performed with water- soluble contrast.  Fluoroscopy Time: 3.26 minutes slow pulsed fluoroscopy  Comparison:  CT scan dated 03/25/2012  Findings:  KUB demonstrates a normal bowel gas pattern.  Calcifications in a cyst in the lower pole of the left kidney.  The patient ingested water-soluble contrast which passed immediately through the pylorus and into the duodenal bulb and C- loop.  The patient does have a small hiatal hernia with spontaneous gastroesophageal reflux.  There is a diverticulum in the second portion of the duodenum.  There is marked deformity of the pyloric channel with what appears to be the remnant of the large pyloric channel ulcer.  IMPRESSION: No evidence of extravasation.  There is a deformity  with what appears to be a prominent ulcer crater along the greater curvature of the pylorus.  No obstruction.  Original Report Authenticated By: Gwynn Burly, M.D.   Medications: Scheduled Meds:   . bisacodyl  10 mg Rectal Daily  . feeding supplement  237 mL Oral Q24H  . fluconazole (DIFLUCAN) IV  400 mg Intravenous Q24H  . heparin  5,000 Units Subcutaneous Q8H  . insulin aspart  0-9 Units Subcutaneous Q4H  . levofloxacin  750 mg Oral Daily  . levothyroxine  50 mcg Oral QAC breakfast  . pantoprazole  40 mg Oral BID AC  . saccharomyces boulardii  250 mg Oral BID  . DISCONTD: levothyroxine  26 mcg Intravenous Daily  . DISCONTD: piperacillin-tazobactam (ZOSYN)  IV  3.375 g  Intravenous Q8H   Continuous Infusions:   . DISCONTD: dexrose 5 % and 0.45 % NaCl with KCl 30 mEq/L 50 mL/hr at 03/31/12 0700   PRN Meds:.acetaminophen, fentaNYL, labetalol, ondansetron (ZOFRAN) IV, traMADol  Assessment/Plan: Patient Active Hospital Problem List: Peritonitis s/p perforated pyloric ulcer - OR 5/24 for laparoscopic repair w/ omental patch  Resolved. Pt patient has leukocytosis. She has worsening leukocytosis with low grade temp . Her KUB does not show any pneumo peritoneum. His cultures show strepto viridans sensitive to levaquin.  If the leukocytosis is not improved, will evaluate with a CXR and UA.  Acute renal failure  Due to sepsis/hypotension - resolved  Lactic Acidosis  Due to above - resolved  Hypokalemia  Replace with goal of 4.0 or > - Mg is borderline so will also supplement Mg  Anemia of critical illness  Hgb beginning to improve  HTN  Uncontrolled - adjust tx and follow trend  DM  Reasonably controlled at the present time  Diastolic CHF via echo 2011  No clinical evidence of volume overload  hypothyroidism  Remains on synthroid  Dispo  Mobilize - PT/OT - pt is highly motivated to d/c home soon.     LOS: 7 days   Raylene Carmickle 03/31/2012, 8:00 PM

## 2012-04-01 ENCOUNTER — Inpatient Hospital Stay (HOSPITAL_COMMUNITY): Payer: Medicare Other

## 2012-04-01 DIAGNOSIS — K650 Generalized (acute) peritonitis: Secondary | ICD-10-CM

## 2012-04-01 DIAGNOSIS — N170 Acute kidney failure with tubular necrosis: Secondary | ICD-10-CM

## 2012-04-01 DIAGNOSIS — K250 Acute gastric ulcer with hemorrhage: Secondary | ICD-10-CM

## 2012-04-01 DIAGNOSIS — I959 Hypotension, unspecified: Secondary | ICD-10-CM

## 2012-04-01 LAB — URINALYSIS, ROUTINE W REFLEX MICROSCOPIC
Glucose, UA: 500 mg/dL — AB
Protein, ur: NEGATIVE mg/dL
Specific Gravity, Urine: 1.012 (ref 1.005–1.030)
pH: 6 (ref 5.0–8.0)

## 2012-04-01 LAB — GLUCOSE, CAPILLARY: Glucose-Capillary: 110 mg/dL — ABNORMAL HIGH (ref 70–99)

## 2012-04-01 LAB — CBC
Hemoglobin: 9.6 g/dL — ABNORMAL LOW (ref 12.0–15.0)
MCH: 28.4 pg (ref 26.0–34.0)
RBC: 3.38 MIL/uL — ABNORMAL LOW (ref 3.87–5.11)
WBC: 12.4 10*3/uL — ABNORMAL HIGH (ref 4.0–10.5)

## 2012-04-01 LAB — BASIC METABOLIC PANEL
CO2: 24 mEq/L (ref 19–32)
Calcium: 8.5 mg/dL (ref 8.4–10.5)
Chloride: 101 mEq/L (ref 96–112)
Glucose, Bld: 167 mg/dL — ABNORMAL HIGH (ref 70–99)
Sodium: 135 mEq/L (ref 135–145)

## 2012-04-01 LAB — URINE MICROSCOPIC-ADD ON

## 2012-04-01 MED ORDER — AMLODIPINE BESYLATE 5 MG PO TABS: 5.0000 mg | ORAL_TABLET | Freq: Every day | ORAL | Status: DC

## 2012-04-01 MED ORDER — AMLODIPINE BESYLATE 5 MG PO TABS
5.0000 mg | ORAL_TABLET | Freq: Every day | ORAL | Status: DC
  Administered 2012-04-01: 5 mg via ORAL
  Filled 2012-04-01: qty 1

## 2012-04-01 MED ORDER — LISINOPRIL 10 MG PO TABS
10.0000 mg | ORAL_TABLET | Freq: Every day | ORAL | Status: DC
  Administered 2012-04-01: 10 mg via ORAL
  Filled 2012-04-01: qty 1

## 2012-04-01 MED ORDER — FLUCONAZOLE 200 MG PO TABS
400.0000 mg | ORAL_TABLET | Freq: Every day | ORAL | Status: DC
  Administered 2012-04-01: 400 mg via ORAL
  Filled 2012-04-01: qty 2

## 2012-04-01 MED ORDER — LISINOPRIL 10 MG PO TABS: 10.0000 mg | ORAL_TABLET | Freq: Every day | ORAL | Status: DC

## 2012-04-01 MED ORDER — POTASSIUM CHLORIDE ER 10 MEQ PO TBCR: 40.0000 meq | EXTENDED_RELEASE_TABLET | Freq: Every day | ORAL | Status: DC

## 2012-04-01 NOTE — Progress Notes (Signed)
ANTIFUNGAL CONSULT NOTE - FOLLOW UP  Pharmacy Consult for Fluconazole Indication: Empiric peritonitis coverage  No Known Allergies  Patient Measurements: Height: 5\' 8"  (172.7 cm) Weight: 160 lb (72.576 kg) IBW/kg (Calculated) : 63.9   Vital Signs: Temp: 98 F (36.7 C) (05/31 0948) Temp src: Oral (05/31 0948) BP: 173/71 mmHg (05/31 0948) Pulse Rate: 83  (05/31 0948) Intake/Output from previous day: 05/30 0701 - 05/31 0700 In: 600 [P.O.:600] Out: 600 [Urine:600] Intake/Output from this shift:    Labs:  Basename 04/01/12 0903 03/31/12 1519 03/31/12 0625  WBC 12.4* 12.7* 11.4*  HGB 9.6* 10.2* 10.2*  PLT 228 215 136*  LABCREA -- -- --  CREATININE -- -- 0.71   Estimated Creatinine Clearance: 54.7 ml/min (by C-G formula based on Cr of 0.71). No results found for this basename: VANCOTROUGH:2,VANCOPEAK:2,VANCORANDOM:2,GENTTROUGH:2,GENTPEAK:2,GENTRANDOM:2,TOBRATROUGH:2,TOBRAPEAK:2,TOBRARND:2,AMIKACINPEAK:2,AMIKACINTROU:2,AMIKACIN:2, in the last 72 hours   Microbiology: Recent Results (from the past 720 hour(s))  URINE CULTURE     Status: Normal   Collection Time   03/24/12  9:55 PM      Component Value Range Status Comment   Specimen Description URINE, CATHETERIZED   Final    Special Requests Normal   Final    Culture  Setup Time [id number]   Final    Colony Count NO GROWTH   Final    Culture NO GROWTH   Final    Report Status 03/25/2012 FINAL   Final   CULTURE, BLOOD (ROUTINE X 2)     Status: Normal   Collection Time   03/24/12 10:00 PM      Component Value Range Status Comment   Specimen Description BLOOD HAND RIGHT   Final    Special Requests BOTTLES DRAWN AEROBIC ONLY 4CC   Final    Culture  Setup Time [id number]   Final    Culture NO GROWTH 5 DAYS   Final    Report Status 03/31/2012 FINAL   Final   CULTURE, BLOOD (ROUTINE X 2)     Status: Normal   Collection Time   03/24/12 10:30 PM      Component Value Range Status Comment   Specimen Description BLOOD ARM  RIGHT   Final    Special Requests BOTTLES DRAWN AEROBIC ONLY 1CC   Final    Culture  Setup Time [id number]   Final    Culture NO GROWTH 5 DAYS   Final    Report Status 03/31/2012 FINAL   Final   ANAEROBIC CULTURE     Status: Normal   Collection Time   03/25/12  3:32 AM      Component Value Range Status Comment   Specimen Description FLUID PERITONEAL   Final    Special Requests NONE ON SWAB PATIENT ON FOLLOWING ZOSYN   Final    Gram Stain     Final    Value: FEW WBC PRESENT,BOTH PMN AND MONONUCLEAR     NO ORGANISMS SEEN   Culture NO ANAEROBES ISOLATED   Final    Report Status 03/29/2012 FINAL   Final   BODY FLUID CULTURE     Status: Normal   Collection Time   03/25/12  3:32 AM      Component Value Range Status Comment   Specimen Description FLUID PERITONEAL   Final    Special Requests NONE ON SWAB PATIENT ON FOLLOWING ZOSYN   Final    Gram Stain     Final    Value: FEW WBC PRESENT,BOTH PMN AND MONONUCLEAR     NO ORGANISMS SEEN  Culture     Final    Value: RARE VIRIDANS STREPTOCOCCUS     FEW HAEMOPHILUS PARAINFLUENZAE     FEW CANDIDA TROPICALIS     Note: BETA LACTAMASE NEGATIVE     Note: CRITICAL RESULT CALLED TO, READ BACK BY AND VERIFIED WITH: RN A. MINTZ ON 03/27/12 BY DTERRY   Report Status 03/30/2012 FINAL   Final    Organism ID, Bacteria VIRIDANS STREPTOCOCCUS   Final   MRSA PCR SCREENING     Status: Normal   Collection Time   03/25/12  4:19 AM      Component Value Range Status Comment   MRSA by PCR NEGATIVE  NEGATIVE  Final     Anti-infectives     Start     Dose/Rate Route Frequency Ordered Stop   04/01/12 1030   fluconazole (DIFLUCAN) tablet 400 mg        400 mg Oral Daily 04/01/12 1022     03/31/12 1600   levofloxacin (LEVAQUIN) tablet 750 mg        750 mg Oral Daily 03/31/12 1517     03/31/12 0000   levofloxacin (LEVAQUIN) 750 MG tablet        750 mg Oral Daily 03/31/12 1623 04/10/12 2359   03/31/12 0000   fluconazole (DIFLUCAN) 200 MG tablet        400 mg  Oral Daily 03/31/12 1623 04/07/12 2359   03/30/12 0800   fluconazole (DIFLUCAN) IVPB 400 mg  Status:  Discontinued        400 mg 200 mL/hr over 60 Minutes Intravenous Every 24 hours 03/29/12 1218 04/01/12 1022   03/27/12 1530   fluconazole (DIFLUCAN) IVPB 200 mg  Status:  Discontinued        200 mg 100 mL/hr over 60 Minutes Intravenous Every 24 hours 03/27/12 1527 03/27/12 1534   03/26/12 1400   piperacillin-tazobactam (ZOSYN) IVPB 3.375 g  Status:  Discontinued        3.375 g 12.5 mL/hr over 240 Minutes Intravenous 3 times per day 03/26/12 1252 03/31/12 1517   03/26/12 0600   fluconazole (DIFLUCAN) IVPB 200 mg  Status:  Discontinued        200 mg 100 mL/hr over 60 Minutes Intravenous Every 24 hours 03/25/12 0406 03/29/12 1218   03/25/12 0600   piperacillin-tazobactam (ZOSYN) IVPB 2.25 g  Status:  Discontinued        2.25 g 100 mL/hr over 30 Minutes Intravenous 4 times per day 03/25/12 0406 03/26/12 1252   03/25/12 0415   piperacillin-tazobactam (ZOSYN) IVPB 3.375 g  Status:  Discontinued        3.375 g 100 mL/hr over 30 Minutes Intravenous  Once 03/25/12 0406 03/25/12 0452   03/25/12 0145   gentamycin 80 mg in 0.9% normal saline 250 mL irrigation  Status:  Discontinued        80 mg Irrigation Every 5 min 03/25/12 0122 03/25/12 0406   03/25/12 0145   clindamycin (CLEOCIN) IVPB 900 mg        900 mg 100 mL/hr over 30 Minutes Intravenous  Once 03/25/12 0142 03/25/12 0652   03/25/12 0130   clindamycin (CLEOCIN) injection 900 mg  Status:  Discontinued        900 mg Intravenous  Once 03/25/12 0126 03/25/12 0141   03/25/12 0100   fluconazole (DIFLUCAN) IVPB 200 mg        200 mg 100 mL/hr over 60 Minutes Intravenous  Once 03/25/12 0051 03/25/12 0735   03/24/12  2345  piperacillin-tazobactam (ZOSYN) IVPB 3.375 g       3.375 g 100 mL/hr over 30 Minutes Intravenous  Once 03/24/12 2341 03/25/12 0015          Assessment: 78 y.o. F on fluconazole per Rx along with Levaquin per MD  for empiric sepsis coverage in setting of peritonitis and perforated duodenal ulcer. SCr: 0.71, CrCl~50-55 ml/min. The peritoneal fluid culture showed candida on the gram stain however the culture predominantly grew viridans streptococcus. Blood cultures from 5/23 were no growth. Today is D#8 on empiric antifungal treatment -- please address LOT and changing to po if continues past today.  Goal of Therapy:  Eradication of infection.   Plan:  1. Continue fluconazole 400 mg IV every 24 hours 2. Will continue to follow renal function, culture results, LOT, and  de-escalation plans   Georgina Pillion, PharmD, BCPS Clinical Pharmacist Pager: [phone] 04/01/2012 10:34 AM

## 2012-04-01 NOTE — Progress Notes (Signed)
CBG: 66  Treatment: 15 GM carbohydrate snack  Symptoms: None  Follow-up CBG: Time:0049 CBG Result:86  Possible Reasons for Event: Inadequate meal intake  Comments/MD notified:MD not notified.  Patient ate graham crackers, peanut butter and cranberry juice.  No symptoms, will continue to monitor.     Megan [name]

## 2012-04-01 NOTE — Progress Notes (Signed)
Inpatient Diabetes Program Recommendations  AACE/ADA: New Consensus Statement on Inpatient Glycemic Control (2009)  Target Ranges:  Prepandial:   less than 140 mg/dL      Peak postprandial:   less than 180 mg/dL (1-2 hours)      Critically ill patients:  140 - 180 mg/dL   Reason for Visit: Mild hypoglycemia (67 mg/dL)  Inpatient Diabetes Program Recommendations Correction (SSI): Pt eating regular diet.  Would be helpful to use carbohydrate modified diet..Can change correction to to tidwc and the HS scale rather than q 4 hrs. Diet: Will add carbohydrate modified to diet orders  Note: Thank you, Lenor Coffin, RN, CNS, Diabetes Coordinator [phone])

## 2012-04-01 NOTE — Progress Notes (Signed)
Physical Therapy Treatment Patient Details Name: Megan [name] MRN: [id number] DOB: July 14, 1927 Today's Date: 04/01/2012 Time: 4098-1191 PT Time Calculation (min): 23 min  PT Assessment / Plan / Recommendation Comments on Treatment Session  Pt. "down" this am and feels there is something else wrong with her.  Mobility limited today because of these issues.    Follow Up Recommendations  Home health PT    Barriers to Discharge        Equipment Recommendations  None recommended by PT    Recommendations for Other Services    Frequency Min 3X/week   Plan Discharge plan remains appropriate    Precautions / Restrictions Precautions Precautions: Fall Restrictions Weight Bearing Restrictions: No   Pertinent Vitals/Pain  Pt. Denies pain but says abdomen is sore "from the tubes they had in me".  No distress.   Mobility  Bed Mobility Bed Mobility: Supine to Sit;Sit to Supine Supine to Sit: 5: Supervision Details for Bed Mobility Assistance: moving guaardedly due to abdominal soreness, per pt. Transfers Transfers: Sit to Stand;Stand to Sit Sit to Stand: 5: Supervision;With upper extremity assist;From bed;From chair/3-in-1 Stand to Sit: 5: Supervision;To chair/3-in-1 Details for Transfer Assistance: reinforced hand placement for safety Ambulation/Gait Ambulation/Gait Assistance: 4: Min assist Ambulation Distance (Feet): 20 Feet Assistive device: Rolling walker Ambulation/Gait Assistance Details: Pt. quite flexed this am during gait.  She needs safety cues with the use of RW as she tends to leave it off to the side.  Encouraged to keep RW close to her for all mobility. Gait Pattern: Step-through pattern;Decreased step length - right;Decreased step length - left;Decreased stride length Stairs: No    Exercises     PT Diagnosis:    PT Problem List:   PT Treatment Interventions:     PT Goals Acute Rehab PT Goals PT Goal Formulation: With patient Time For Goal Achievement:  04/15/12 Potential to Achieve Goals: Good Pt will go Supine/Side to Sit: Independently PT Goal: Supine/Side to Sit - Progress: Progressing toward goal Pt will go Sit to Stand: Independently PT Goal: Sit to Stand - Progress: Progressing toward goal Pt will Ambulate: >150 feet;with modified independence;with least restrictive assistive device PT Goal: Ambulate - Progress: Progressing toward goal  Visit Information  Last PT Received On: 04/01/12 Assistance Needed: +1    Subjective Data  Subjective: I just feel so down, I feel like I just want to go to sleep.   Cognition  Overall Cognitive Status: Appears within functional limits for tasks assessed/performed Arousal/Alertness: Awake/alert Orientation Level: Oriented X4 / Intact Behavior During Session: [facility] for tasks performed Cognition - Other Comments: Pt. tearful this am and says she is so disappointed not to be going home.  Worried about the doctors finding something else wrong with her.    Balance  Static Sitting Balance Static Sitting - Balance Support: No upper extremity supported;Feet supported Static Sitting - Level of Assistance: 7: Independent  End of Session PT - End of Session Equipment Utilized During Treatment: Gait belt Activity Tolerance: Other (comment) (pt. limited by her mental status (tearful and diccouraged)) Patient left: with call bell/phone within reach Nurse Communication: Mobility status    Ferman Hamming 04/01/2012, 12:19 PM Acute Rehabilitation Services [phone] (pager)

## 2012-04-01 NOTE — Discharge Summary (Signed)
DISCHARGE SUMMARY  Megan [name]  MR#: [id number]  DOB:1927/05/03  Date of Admission: 03/24/2012 Date of Discharge: 04/01/2012  Attending Physician:Dellar Traber  Patient's WUJ:WJXBJYN,WGNFAO O, MD, MD  Consults: - General Surgery pccm  Discharge Diagnoses: Present on Admission:  .Peritonitis .Intra-abdominal free air of unknown etiology .AKI (acute kidney injury) .Lactic acidosis .Severe sepsis .Delirium .Diabetes mellitus .Diastolic dysfunction, left ventricle by ECHO 2011 .Arthritis .Hypothyroidism   Initial presentation:  78 y/o female with DM and HTN presented to the [facility] ED on 5/23 with abdominal pain for three days and was found to have free air on a KUB. She stated that she had been constipated for several days prior to admission. Her last bowel movement was on 5/21 which was associated with a lot of straining and one episode of nausea and vomiting. She was noted to be confused by her family on the day of admission and so they brought her in to the ED. She was admitted by critical care.    Hospital Course: Peritonitis s/p perforated pyloric ulcer - OR 5/24 for laparoscopic repair w/ omental patch : resolved. She was initially admitted to [facility] service on 5/23.   She underwent a CT ABDOMEN on 5/24 showing pneumoperitoneum with ascites,diffuse colonic diverticulosis.she was taken in to OR and was found to ave chronic duodenal perforation and was treated with lap Cheree Ditto patch.  She was started on broad spectrum antibiotics with IV ZOSYN and IV Flucanozole. She was started on pressors for shock from sepsis and intubated for acute respiratory failure on 5/24  And an NG tube was placed for post operative ileus. She was extubated on 5/25 and pressors were stopped, as her Blood pressure improved.  She was transferred to [facility] service on 5/27 for further management. NG tube was takenout and she was started on clear liquid diet on 5/27, and advanced her diet to regular  as she tolerated.  Her JP drains were removed on 5/30 and she tolerated regular diet without any complications.  Her fluid cultures came back positive for Streptococcus Viridans, sensitive to levaquin. She was to be discharged on 5/30,  But her WBC count was elevated. She remained afebrile, .  Her discharge was held, and we obtained a KUB, CXR and UA.  She did not have diarrhea. We could not find a source of infection and she remained asymptomatic and afebrile.   She is being discharged today with levaquin and diflucan to complete the course of antibiotics. She is also recommended to get a CBC on Monday to follow up her WBC count and if she has recurrent pain or nausea/vomiting/ abdominal pain. She should come to ED as soon as possible for further evaluation.   Acute renal failure  Due to sepsis/hypotension - resolved with IV hydration and with resolution of sepsis.  Lactic Acidosis  Due to above - resolved  Hypokalemia  repleted and she was given a prescription of potassium tablets for 2 days.  Anemia of critical illness  Stable.  HTN  Uncontrolled , started her on amlodipine and lisinopril.  DM  Reasonably controlled at the present time  Diastolic CHF via echo 2011  No clinical evidence of volume overload  hypothyroidism  Remains on synthroid Disposition: patient wanted to go home, she reported if she needs further testing , she would get it at PCP office.  PT/OT evaluation was obtained. Recommended HOME PT.      Medication List  As of 04/01/2012  2:49 PM   STOP taking these medications  ADVIL PO      amoxicillin 500 MG capsule      meloxicam 15 MG tablet         TAKE these medications         amLODipine 5 MG tablet   Commonly known as: NORVASC   Take 1 tablet (5 mg total) by mouth daily.      clorazepate 7.5 MG tablet   Commonly known as: TRANXENE   Take 7.5 mg by mouth at bedtime.      feeding supplement Liqd   Take 237 mLs by mouth daily.       fluconazole 200 MG tablet   Commonly known as: DIFLUCAN   Take 2 tablets (400 mg total) by mouth daily.      glimepiride 4 MG tablet   Commonly known as: AMARYL   Take 4 mg by mouth daily before breakfast.      hyoscyamine 0.125 MG tablet   Commonly known as: LEVSIN, ANASPAZ   Take 0.125 mg by mouth every 4 (four) hours as needed. For cramping/bloating/diarrhea      levofloxacin 750 MG tablet   Commonly known as: LEVAQUIN   Take 1 tablet (750 mg total) by mouth daily.      levothyroxine 50 MCG tablet   Commonly known as: SYNTHROID, LEVOTHROID   Take 50 mcg by mouth daily.      lisinopril 10 MG tablet   Commonly known as: PRINIVIL,ZESTRIL   Take 1 tablet (10 mg total) by mouth daily.      pantoprazole 40 MG tablet   Commonly known as: PROTONIX   Take 1 tablet (40 mg total) by mouth daily.      potassium chloride 10 MEQ tablet   Commonly known as: K-DUR   Take 4 tablets (40 mEq total) by mouth daily.      saccharomyces boulardii 250 MG capsule   Commonly known as: FLORASTOR   Take 1 capsule (250 mg total) by mouth 2 (two) times daily.      VITAMIN D PO   Take 1 tablet by mouth daily.             Day of Discharge BP 145/69  Pulse 82  Temp(Src) 97.8 F (36.6 C) (Oral)  Resp 18  Ht 5\' 8"  (1.727 m)  Wt 72.576 kg (160 lb)  BMI 24.33 kg/m2  SpO2 99%   Physical Exam:  General: No acute respiratory distress  Lungs: Clear to auscultation bilaterally without wheezes or crackles  Cardiovascular: Regular rate and rhythm without murmur gallop or rub normal S1 and S2  Abdomen: Nontender, nondistended, soft, bowel sounds positive, no rebound, no ascites, no appreciable mass - lap wounds dressed and dry  Extremities: No significant cyanosis, clubbing, or edema bilateral lower extremities   Results for orders placed during the hospital encounter of 03/24/12 (from the past 24 hour(s))  CBC     Status: Abnormal   Collection Time   03/31/12  3:19 PM      Component Value  Range   WBC 12.7 (*) 4.0 - 10.5 (K/uL)   RBC 3.53 (*) 3.87 - 5.11 (MIL/uL)   Hemoglobin 10.2 (*) 12.0 - 15.0 (g/dL)   HCT 16.1 (*) 09.6 - 46.0 (%)   MCV 85.8  78.0 - 100.0 (fL)   MCH 28.9  26.0 - 34.0 (pg)   MCHC 33.7  30.0 - 36.0 (g/dL)   RDW 04.5  40.9 - 81.1 (%)   Platelets 215  150 - 400 (K/uL)  GLUCOSE, CAPILLARY     Status: Abnormal   Collection Time   03/31/12  3:54 PM      Component Value Range   Glucose-Capillary 139 (*) 70 - 99 (mg/dL)  GLUCOSE, CAPILLARY     Status: Abnormal   Collection Time   03/31/12  8:32 PM      Component Value Range   Glucose-Capillary 154 (*) 70 - 99 (mg/dL)  GLUCOSE, CAPILLARY     Status: Abnormal   Collection Time   04/01/12 12:06 AM      Component Value Range   Glucose-Capillary 67 (*) 70 - 99 (mg/dL)  GLUCOSE, CAPILLARY     Status: Normal   Collection Time   04/01/12 12:49 AM      Component Value Range   Glucose-Capillary 86  70 - 99 (mg/dL)  GLUCOSE, CAPILLARY     Status: Abnormal   Collection Time   04/01/12  4:36 AM      Component Value Range   Glucose-Capillary 110 (*) 70 - 99 (mg/dL)  GLUCOSE, CAPILLARY     Status: Abnormal   Collection Time   04/01/12  7:29 AM      Component Value Range   Glucose-Capillary 104 (*) 70 - 99 (mg/dL)  CBC     Status: Abnormal   Collection Time   04/01/12  9:03 AM      Component Value Range   WBC 12.4 (*) 4.0 - 10.5 (K/uL)   RBC 3.38 (*) 3.87 - 5.11 (MIL/uL)   Hemoglobin 9.6 (*) 12.0 - 15.0 (g/dL)   HCT 96.0 (*) 45.4 - 46.0 (%)   MCV 84.6  78.0 - 100.0 (fL)   MCH 28.4  26.0 - 34.0 (pg)   MCHC 33.6  30.0 - 36.0 (g/dL)   RDW 09.8  11.9 - 14.7 (%)   Platelets 228  150 - 400 (K/uL)  BASIC METABOLIC PANEL     Status: Abnormal   Collection Time   04/01/12  9:03 AM      Component Value Range   Sodium 135  135 - 145 (mEq/L)   Potassium 3.3 (*) 3.5 - 5.1 (mEq/L)   Chloride 101  96 - 112 (mEq/L)   CO2 24  19 - 32 (mEq/L)   Glucose, Bld 167 (*) 70 - 99 (mg/dL)   BUN 5 (*) 6 - 23 (mg/dL)   Creatinine,  Ser 8.29  0.50 - 1.10 (mg/dL)   Calcium 8.5  8.4 - 56.2 (mg/dL)   GFR calc non Af Amer 81 (*) >90 (mL/min)   GFR calc Af Amer >90  >90 (mL/min)  GLUCOSE, CAPILLARY     Status: Abnormal   Collection Time   04/01/12 11:38 AM      Component Value Range   Glucose-Capillary 132 (*) 70 - 99 (mg/dL)  URINALYSIS, ROUTINE W REFLEX MICROSCOPIC     Status: Abnormal   Collection Time   04/01/12 12:12 PM      Component Value Range   Color, Urine YELLOW  YELLOW    APPearance CLEAR  CLEAR    Specific Gravity, Urine 1.012  1.005 - 1.030    pH 6.0  5.0 - 8.0    Glucose, UA 500 (*) NEGATIVE (mg/dL)   Hgb urine dipstick SMALL (*) NEGATIVE    Bilirubin Urine NEGATIVE  NEGATIVE    Ketones, ur NEGATIVE  NEGATIVE (mg/dL)   Protein, ur NEGATIVE  NEGATIVE (mg/dL)   Urobilinogen, UA 0.2  0.0 - 1.0 (mg/dL)   Nitrite NEGATIVE  NEGATIVE  Leukocytes, UA TRACE (*) NEGATIVE   URINE MICROSCOPIC-ADD ON     Status: Abnormal   Collection Time   04/01/12 12:12 PM      Component Value Range   Squamous Epithelial / LPF FEW (*) RARE    WBC, UA 0-2  <3 (WBC/hpf)   RBC / HPF 3-6  <3 (RBC/hpf)   Bacteria, UA RARE  RARE     Disposition: hOME WITH HOME PT.  Follow-up Appts:   Follow-up Information    Follow up with GROSS,STEVEN C., MD. Schedule an appointment as soon as possible for a visit in 3 weeks.   Contact information:   [facility] [address] [place] [address] [phone]       Follow up with Pola Corn, MD. Schedule an appointment as soon as possible for a visit ON mONDAY TO CHECK CBC AND BMP s.   Contact information:   [address] [place] [address] [phone]          Tests Needing Follow-up: CBC AND BMP ON Monday.   Time spent in discharge (includes decision making & examination of pt): 85  minutes  Signed: Kiera Hussey 04/01/2012, 2:49 PM

## 2012-04-01 NOTE — Progress Notes (Addendum)
AVS reviewed with pt and son at bedside. Pt remains stable. No IV present. Pt able to verbalize understanding of AVS and questions were answered. RX's given. Pt escorted via wheelchair to exit of facility. Megan [name], Rosanna Randy

## 2012-04-25 ENCOUNTER — Emergency Department (HOSPITAL_COMMUNITY)
Admission: EM | Admit: 2012-04-25 | Discharge: 2012-04-26 | Disposition: A | Discharge status: Home / Self Care | Payer: Medicare Other | Attending: Emergency Medicine | Admitting: Emergency Medicine

## 2012-04-25 ENCOUNTER — Encounter (HOSPITAL_COMMUNITY): Payer: Self-pay | Admitting: *Deleted

## 2012-04-25 ENCOUNTER — Emergency Department (HOSPITAL_COMMUNITY): Payer: Medicare Other

## 2012-04-25 DIAGNOSIS — R4182 Altered mental status, unspecified: Secondary | ICD-10-CM | POA: Insufficient documentation

## 2012-04-25 DIAGNOSIS — R51 Headache: Secondary | ICD-10-CM | POA: Insufficient documentation

## 2012-04-25 DIAGNOSIS — I10 Essential (primary) hypertension: Secondary | ICD-10-CM | POA: Insufficient documentation

## 2012-04-25 DIAGNOSIS — F068 Other specified mental disorders due to known physiological condition: Secondary | ICD-10-CM | POA: Insufficient documentation

## 2012-04-25 DIAGNOSIS — Z79899 Other long term (current) drug therapy: Secondary | ICD-10-CM | POA: Insufficient documentation

## 2012-04-25 DIAGNOSIS — F0390 Unspecified dementia without behavioral disturbance: Secondary | ICD-10-CM

## 2012-04-25 DIAGNOSIS — E7800 Pure hypercholesterolemia, unspecified: Secondary | ICD-10-CM | POA: Insufficient documentation

## 2012-04-25 DIAGNOSIS — E039 Hypothyroidism, unspecified: Secondary | ICD-10-CM | POA: Insufficient documentation

## 2012-04-25 DIAGNOSIS — E119 Type 2 diabetes mellitus without complications: Secondary | ICD-10-CM | POA: Insufficient documentation

## 2012-04-25 DIAGNOSIS — I519 Heart disease, unspecified: Secondary | ICD-10-CM | POA: Insufficient documentation

## 2012-04-25 LAB — CBC
HCT: 33.5 % — ABNORMAL LOW (ref 36.0–46.0)
MCV: 85.9 fL (ref 78.0–100.0)
Platelets: 161 10*3/uL (ref 150–400)
RBC: 3.9 MIL/uL (ref 3.87–5.11)
WBC: 9.8 10*3/uL (ref 4.0–10.5)

## 2012-04-25 LAB — DIFFERENTIAL
Eosinophils Relative: 5 % (ref 0–5)
Lymphocytes Relative: 34 % (ref 12–46)
Lymphs Abs: 3.3 10*3/uL (ref 0.7–4.0)

## 2012-04-25 NOTE — ED Notes (Signed)
In and out cath completed using sterile technique. Clear urine returned. Pt tolerated well.  

## 2012-04-25 NOTE — ED Provider Notes (Addendum)
History     CSN: [id number]  Arrival date & time 04/25/12  2212   First MD Initiated Contact with Patient 04/25/12 2230      Chief Complaint  Patient presents with  . Altered Mental Status    (Consider location/radiation/quality/duration/timing/severity/associated sxs/prior treatment) HPI Comments: History is significantly limited since patient is unable to give [place] history at this time.  The history from EMS is that patient has altered mental status from her baseline.  She has no history of dementia and family called due to confusion and being "harder to handle".  Patient cannot answer simple questions such as her name, location or month.  Patient denies pain anywhere on my exam.  Patient is a 78 y.o. female presenting with altered mental status. The history is provided by the EMS personnel. The history is limited by the condition of the patient (Pt appears confused).  Altered Mental Status This is a new problem. Pertinent negatives include no chest pain, no abdominal pain and no shortness of breath.    Past Medical History  Diagnosis Date  . Hypertension   . Diastolic dysfunction, left ventricle by ECHO 2011 03/25/2012  . Hypothyroidism 03/25/2012  . High cholesterol   . Type II diabetes mellitus   . H/O hiatal hernia   . Duodenal perforation 03/23/12  . Arthritis     "in my back"    Past Surgical History  Procedure Date  . Bladder tacked   . Repair perforated ulcer 03/23/12  . Diagnostic laparoscopy 03/25/12    REPAIR OF PERFORATED ULCER with omental patch  . Abcess drainage 03/25/12     of abdominal & pelvic abscesses  . Laparoscopy 03/24/2012    Procedure: LAPAROSCOPY DIAGNOSTIC;  Surgeon: Ardeth Sportsman, MD;  Location: [facility] OR;  Service: General;  Laterality: N/A;  drainage of abdominal and pelvic abcesses    History reviewed. No pertinent family history.  History  Substance Use Topics  . Smoking status: Never Smoker   . Smokeless tobacco: Current User    Types:  Snuff   Comment: 03/28/12 "use snuff q once in while; not regular"  . Alcohol Use: No    OB History    Grav Para Term Preterm Abortions TAB SAB Ect Mult Living                  Review of Systems  Constitutional: Negative for fever.  Respiratory: Negative for shortness of breath.   Cardiovascular: Negative for chest pain.  Gastrointestinal: Negative for nausea, vomiting, abdominal pain and diarrhea.  Skin: Negative.  Negative for color change and rash.  Psychiatric/Behavioral: Positive for confusion and altered mental status.  All other systems reviewed and are negative.    Allergies  Review of patient's allergies indicates no known allergies.  Home Medications   Current Outpatient Rx  Name Route Sig Dispense Refill  . BISOPROLOL-HYDROCHLOROTHIAZIDE 5-6.25 MG PO TABS Oral Take 1 tablet by mouth daily.    Marland Kitchen CLORAZEPATE DIPOTASSIUM 7.5 MG PO TABS Oral Take 7.5 mg by mouth at bedtime.    Marland Kitchen GLIMEPIRIDE 4 MG PO TABS Oral Take 4 mg by mouth daily before breakfast.    . HYOSCYAMINE SULFATE 0.125 MG PO TABS Oral Take 0.125 mg by mouth every 4 (four) hours as needed. For cramping/bloating/diarrhea    . LEVOTHYROXINE SODIUM 50 MCG PO TABS Oral Take 50 mcg by mouth daily.    Marland Kitchen LISINOPRIL 20 MG PO TABS Oral Take 20 mg by mouth daily.    Marland Kitchen  PANTOPRAZOLE SODIUM 40 MG PO TBEC Oral Take 1 tablet (40 mg total) by mouth daily. 30 tablet 0  . AMLODIPINE BESYLATE 5 MG PO TABS Oral Take 1 tablet (5 mg total) by mouth daily. 30 tablet 0  . ENSURE COMPLETE PO LIQD Oral Take 237 mLs by mouth daily.    Marland Kitchen POTASSIUM CHLORIDE ER 10 MEQ PO TBCR Oral Take 4 tablets (40 mEq total) by mouth daily. 2 tablet 0    BP 170/51  Pulse 60  Temp 97.8 F (36.6 C) (Oral)  Resp 18  SpO2 100%  Physical Exam  Nursing note and vitals reviewed. Constitutional: She appears well-developed and well-nourished.  Non-toxic appearance. She does not have a sickly appearance.  HENT:  Head: Normocephalic and atraumatic.    Eyes: Conjunctivae, EOM and lids are normal. Pupils are equal, round, and reactive to light. No scleral icterus.  Neck: Trachea normal and normal range of motion. Neck supple.  Cardiovascular: Normal rate, regular rhythm and normal heart sounds.  Exam reveals no gallop.   No murmur heard. Pulmonary/Chest: Effort normal and breath sounds normal. No respiratory distress. She has no wheezes. She has no rales. She exhibits no tenderness.  Abdominal: Soft. Normal appearance. There is no tenderness. There is no rebound, no guarding and no CVA tenderness.  Musculoskeletal: Normal range of motion. She exhibits no edema.  Neurological: She is alert. She has normal strength.  Skin: Skin is warm, dry and intact. No rash noted.  Psychiatric:       Since patient is not alert and oriented I am unable to fully assess    ED Course  Procedures (including critical care time)  Results for orders placed during the hospital encounter of 04/25/12  CBC      Component Value Range   WBC 9.8  4.0 - 10.5 K/uL   RBC 3.90  3.87 - 5.11 MIL/uL   Hemoglobin 10.8 (*) 12.0 - 15.0 g/dL   HCT 16.1 (*) 09.6 - 04.5 %   MCV 85.9  78.0 - 100.0 fL   MCH 27.7  26.0 - 34.0 pg   MCHC 32.2  30.0 - 36.0 g/dL   RDW 40.9  81.1 - 91.4 %   Platelets 161  150 - 400 K/uL  DIFFERENTIAL      Component Value Range   Neutrophils Relative 53  43 - 77 %   Neutro Abs 5.1  1.7 - 7.7 K/uL   Lymphocytes Relative 34  12 - 46 %   Lymphs Abs 3.3  0.7 - 4.0 K/uL   Monocytes Relative 8  3 - 12 %   Monocytes Absolute 0.8  0.1 - 1.0 K/uL   Eosinophils Relative 5  0 - 5 %   Eosinophils Absolute 0.5  0.0 - 0.7 K/uL   Basophils Relative 1  0 - 1 %   Basophils Absolute 0.1  0.0 - 0.1 K/uL  COMPREHENSIVE METABOLIC PANEL      Component Value Range   Sodium 137  135 - 145 mEq/L   Potassium 3.6  3.5 - 5.1 mEq/L   Chloride 102  96 - 112 mEq/L   CO2 26  19 - 32 mEq/L   Glucose, Bld 107 (*) 70 - 99 mg/dL   BUN 13  6 - 23 mg/dL   Creatinine, Ser  7.82  0.50 - 1.10 mg/dL   Calcium 9.7  8.4 - 95.6 mg/dL   Total Protein 6.5  6.0 - 8.3 g/dL   Albumin 3.0 (*) 3.5 -  5.2 g/dL   AST 12  0 - 37 U/L   ALT <5  0 - 35 U/L   Alkaline Phosphatase 51  39 - 117 U/L   Total Bilirubin 0.4  0.3 - 1.2 mg/dL   GFR calc non Af Amer 81 (*) >90 mL/min   GFR calc Af Amer >90  >90 mL/min  TROPONIN I      Component Value Range   Troponin I <0.30  <0.30 ng/mL  CK      Component Value Range   Total CK 34  7 - 177 U/L  GLUCOSE, CAPILLARY      Component Value Range   Glucose-Capillary 91  70 - 99 mg/dL   Comment 1 Documented in Chart     Dg Chest 2 View  04/25/2012  *RADIOLOGY REPORT*  Clinical Data: 78 year old female with confusion, altered level of consciousness.  CHEST - 2 VIEW  Comparison: 04/01/2012 and earlier.  Findings: Semi upright AP and lateral views of the chest.  Interval decreased but not completely resolved small pleural effusions. Interval improved bibasilar ventilation.  No areas of worsening ventilation.  No pneumothorax or edema. No acute osseous abnormality identified.  IMPRESSION: Decreased bilateral pleural effusions, small residuals.  Improved basilar ventilation with no new cardiopulmonary abnormality.  Original Report Authenticated By: Harley Hallmark, M.D.     Ct Head Wo Contrast  04/25/2012  *RADIOLOGY REPORT*  Clinical Data: 78 year old female with altered mental status confusion and pain.  CT HEAD WITHOUT CONTRAST  Technique:  Contiguous axial images were obtained from the base of the skull through the vertex without contrast.  Comparison: 03/24/2012 and earlier.  Findings: Visualized paranasal sinuses and mastoids are clear. Hyperostosis frontalis. No acute osseous abnormality identified. Visualized orbits and scalp soft tissues are within normal limits.  Calcified atherosclerosis at the skull base.  Stable cerebral volume.  No ventriculomegaly.  Confluent cerebral white matter hypodensity is stable. No midline shift, mass effect,  or evidence of mass lesion.  No acute intracranial hemorrhage identified.  No suspicious intracranial vascular hyperdensity. No evidence of cortically based acute infarction identified.  Stable dural calcifications.  IMPRESSION: Stable chronic white matter disease. No acute intracranial abnormality.  Original Report Authenticated By: Harley Hallmark, M.D.       Date: 04/25/2012  Rate: 64  Rhythm: normal sinus rhythm with one PAC  QRS Axis: normal  Intervals: normal  ST/T Wave abnormalities: normal  Conduction Disutrbances:none  Narrative Interpretation:   Old EKG Reviewed: unchanged from 03-25-12    MDM  Patient presents with altered mental status from home.  Per EMS report patient has no history of dementia.  At this point in time I have started a broad workup since the patient does not have any focal symptoms that I am able to determine on my history or exam.  Patient denies any pain.  She'll receive a chest x-ray to rule out pneumonia as well as urinalysis to look for UTI.  She'll receive a CBC to look for signs of anemia a BMP to look for signs of electrical disturbance or renal dysfunction.  She'll have a CT head to rule out signs of bleed or mass that might cause altered mental status as well.    Nat Christen, MD 04/25/12 2325  12:12 AM Daughter has now arrived in states that her mother has been acting differently today.  She states that yesterday she was acting like her normal self.  Today she states that she was much more  altered and confused.  She was spitting at her and trying to tear her clothes off which she has never done before.  She does note that her mother has some good days and bad days but may be essentially consistent with dementia.  She notes that she is alert and oriented to date and place part of the time but not all of the time.  No other history for fevers, nausea, vomiting, diarrhea, pain or trouble breathing.  Nat Christen, MD 04/26/12 [phone]  Patient  with no acute findings on her laboratory studies or imaging studies to give cause for her behavior changes.  She has no urinary tract infection or pneumonia.  No glucose abnormalities, likely abnormalities or renal dysfunction.  No anemia.  No leukocytosis to suggest infection.  She has a normal EKG and no acute bump in her troponin.  There is no focal weakness to suggest stroke.  On further discussion with the daughter it appears the patient has a more long-standing history of likely dementia and that today was likely one of her "bad" days.  Patient has a primary care physician appointment tomorrow.  I've had discussions with the patient's daughter regarding close followup tomorrow with the primary care physician in exploring options for home health care or possible assisted living or skilled nursing facility options for the patient's safety in the future.  She is comfortable taking the patient home at this time and will followup with the doctor tomorrow.  Nat Christen, MD 04/26/12 [phone]

## 2012-04-25 NOTE — ED Notes (Signed)
Per EMS: pt from home, family states since 1 pm yesterday pt is more confused and "harder to handle".  Not following commands, strength equal, unable to answer orientation questions.  No hx of demential.

## 2012-04-26 LAB — URINALYSIS, ROUTINE W REFLEX MICROSCOPIC
Bilirubin Urine: NEGATIVE
Ketones, ur: NEGATIVE mg/dL
Nitrite: NEGATIVE
Protein, ur: NEGATIVE mg/dL
Urobilinogen, UA: 0.2 mg/dL (ref 0.0–1.0)

## 2012-04-26 LAB — COMPREHENSIVE METABOLIC PANEL
ALT: <5 U/L (ref 0–35)
Alkaline Phosphatase: 51 U/L (ref 39–117)
CO2: 26 mEq/L (ref 19–32)
Calcium: 9.7 mg/dL (ref 8.4–10.5)
Chloride: 102 mEq/L (ref 96–112)
GFR calc Af Amer: >90 mL/min (ref >90)
GFR calc non Af Amer: 81 mL/min — ABNORMAL LOW (ref >90)
Glucose, Bld: 107 mg/dL — ABNORMAL HIGH (ref 70–99)
Sodium: 137 mEq/L (ref 135–145)
Total Bilirubin: 0.4 mg/dL (ref 0.3–1.2)

## 2012-04-26 LAB — URINE MICROSCOPIC-ADD ON

## 2012-04-26 NOTE — ED Notes (Signed)
Rx x 0, pt voiced understanding to f/u with PCP tomorrow to discuss home health options

## 2012-04-26 NOTE — Discharge Instructions (Signed)
Please follow up with her doctor tomorrow.    Dementia Dementia is a general term for problems with brain function. A person with dementia has memory loss and a hard time with at least one other brain function such as thinking, speaking, or problem solving. Dementia can affect social functioning, how you do your job, your mood, or your personality. The changes may be hidden for a long time. The earliest forms of this disease are usually not detected by family or friends. Dementia can be:  Irreversible.   Potentially reversible.   Partially reversible.   Progressive. This means it can get worse over time.  CAUSES  Irreversible dementia causes may include:  Degeneration of brain cells (Alzheimer's disease or lewy body dementia).   Multiple small strokes (vascular dementia).   Infection (chronic meningitis or Creutzfelt-Jakob disease).   Frontotemporal dementia. This affects younger people, age 17 to 68, compared to those who have Alzheimer's disease.   Dementia associated with other disorders like Parkinson's disease, Huntington's disease, or HIV-associated dementia.  Potentially or partially reversible dementia causes may include:  Medicines.   Metabolic causes such as excessive alcohol intake, vitamin B12 deficiency, or thyroid disease.   Masses or pressure in the brain such as a tumor, blood clot, or hydrocephalus.  SYMPTOMS  Symptoms are often hard to detect. Family members or coworkers may not notice them early in the disease process. Different people with dementia may have different symptoms. Symptoms can include:  A hard time with memory, especially recent memory. Long-term memory may not be impaired.   Asking the same question multiple times or forgetting something someone just said.   A hard time speaking your thoughts or finding certain words.   A hard time solving problems or performing familiar tasks (such as how to use a telephone).   Sudden changes in mood.    Changes in personality, especially increasing moodiness or mistrust.   Depression.   A hard time understanding complex ideas that were never a problem in the past.  DIAGNOSIS  There are no specific tests for dementia.   Your caregiver may recommend a thorough evaluation. This is because some forms of dementia can be reversible. The evaluation will likely include a physical exam and getting a detailed history from you and a family member. The history often gives the best clues and suggestions for a diagnosis.   Memory testing may be done. A detailed brain function evaluation called neuropsychologic testing may be helpful.   Lab tests and brain imaging (such as a CT scan or MRI scan) are sometimes important.   Sometimes observation and re-evaluation over time is very helpful.  TREATMENT  Treatment depends on the cause.   If the problem is a vitamin deficiency, it may be helped or cured with supplements.   For dementias such as Alzheimer's disease, medicines are available to stabilize or slow the course of the disease. There are no cures for this type of dementia.   Your caregiver can help direct you to groups, organizations, and other caregivers to help with decisions in the care of you or your loved one.  HOME CARE INSTRUCTIONS The care of individuals with dementia is varied and dependent upon the progression of the dementia. The following suggestions are intended for the person living with, or caring for, the person with dementia.  Create a safe environment.   Remove the locks on bathroom doors to prevent the person from accidentally locking himself or herself in.   Use childproof latches  on kitchen cabinets and any place where cleaning supplies, chemicals, or alcohol are kept.   Use childproof covers in unused electrical outlets.   Install childproof devices to keep doors and windows secured.   Remove stove knobs or install safety knobs and an automatic shut-off on the stove.    Lower the temperature on water heaters.   Label medicines and keep them locked up.   Secure knives, lighters, matches, power tools, and guns, and keep these items out of reach.   Keep the house free from clutter. Remove rugs or anything that might contribute to a fall.   Remove objects that might break and hurt the person.   Make sure lighting is good, both inside and outside.   Install grab rails as needed.   Use a monitoring device to alert you to falls or other needs for help.   Reduce confusion.   Keep familiar objects and people around.   Use night lights or dim lights at night.   Label items or areas.   Use reminders, notes, or directions for daily activities or tasks.   Keep a simple, consistent routine for waking, meals, bathing, dressing, and bedtime.   Create a calm, quiet environment.   Place large clocks and calendars prominently.   Display emergency numbers and home address near all telephones.   Use cues to establish different times of the day. An example is to open curtains to let the natural light in during the day.    Use effective communication.   Choose simple words and short sentences.   Use a gentle, calm tone of voice.   Be careful not to interrupt.   If the person is struggling to find a word or communicate a thought, try to provide the word or thought.   Ask one question at a time. Allow the person ample time to answer questions. Repeat the question again if the person does not respond.   Reduce nighttime restlessness.   Provide a comfortable bed.   Have a consistent nighttime routine.   Ensure a regular walking or physical activity schedule. Involve the person in daily activities as much as possible.   Limit napping during the day.   Limit caffeine.   Attend social events that stimulate rather than overwhelm the senses.   Encourage good nutrition and hydration.   Reduce distractions during meal times and snacks.   Avoid  foods that are too hot or too cold.   Monitor chewing and swallowing ability.   Continue with routine vision, hearing, dental, and medical screenings.   Only give over-the-counter or prescription medicines as directed by the caregiver.   Monitor driving abilities. Do not allow the person to drive when safe driving is no longer possible.   Register with an identification program which could provide location assistance in the event of a missing person situation.  SEEK MEDICAL CARE IF:   New behavioral problems start such as moodiness, aggressiveness, or seeing things that are not there (hallucinations).   Any new problem with brain function happens. This includes problems with balance, speech, or falling a lot.   Problems with swallowing develop.   Any symptoms of other illness happen.  Small changes or worsening in any aspect of brain function can be a sign that the illness is getting worse. It can also be a sign of another medical illness such as infection. Seeing a caregiver right away is important. SEEK IMMEDIATE MEDICAL CARE IF:   A fever develops.  New or worsened confusion develops.   New or worsened sleepiness develops.   Staying awake becomes hard to do.  Document Released: 04/14/2001 Document Revised: 10/08/2011 Document Reviewed: 03/16/2011 [facility] Patient Information 2012 [place], [place].

## 2012-04-26 NOTE — ED Notes (Signed)
EDP at bedside  

## 2012-04-27 ENCOUNTER — Observation Stay (HOSPITAL_COMMUNITY): Payer: Medicare Other

## 2012-04-27 ENCOUNTER — Observation Stay (HOSPITAL_COMMUNITY)
Admission: EM | Admit: 2012-04-27 | Discharge: 2012-05-02 | Disposition: A | Discharge status: Skilled Nursing Facility | Payer: Medicare Other | Attending: Internal Medicine | Admitting: Internal Medicine

## 2012-04-27 ENCOUNTER — Encounter (HOSPITAL_COMMUNITY): Payer: Self-pay | Admitting: Radiology

## 2012-04-27 DIAGNOSIS — R410 Disorientation, unspecified: Secondary | ICD-10-CM

## 2012-04-27 DIAGNOSIS — R4182 Altered mental status, unspecified: Principal | ICD-10-CM | POA: Insufficient documentation

## 2012-04-27 DIAGNOSIS — E039 Hypothyroidism, unspecified: Secondary | ICD-10-CM | POA: Insufficient documentation

## 2012-04-27 DIAGNOSIS — K449 Diaphragmatic hernia without obstruction or gangrene: Secondary | ICD-10-CM | POA: Insufficient documentation

## 2012-04-27 DIAGNOSIS — R5381 Other malaise: Secondary | ICD-10-CM | POA: Insufficient documentation

## 2012-04-27 DIAGNOSIS — E032 Hypothyroidism due to medicaments and other exogenous substances: Secondary | ICD-10-CM

## 2012-04-27 DIAGNOSIS — R5383 Other fatigue: Secondary | ICD-10-CM | POA: Insufficient documentation

## 2012-04-27 DIAGNOSIS — I10 Essential (primary) hypertension: Secondary | ICD-10-CM | POA: Insufficient documentation

## 2012-04-27 DIAGNOSIS — M129 Arthropathy, unspecified: Secondary | ICD-10-CM | POA: Insufficient documentation

## 2012-04-27 DIAGNOSIS — F05 Delirium due to known physiological condition: Secondary | ICD-10-CM

## 2012-04-27 DIAGNOSIS — E118 Type 2 diabetes mellitus with unspecified complications: Secondary | ICD-10-CM

## 2012-04-27 DIAGNOSIS — E1129 Type 2 diabetes mellitus with other diabetic kidney complication: Secondary | ICD-10-CM | POA: Diagnosis present

## 2012-04-27 DIAGNOSIS — Z23 Encounter for immunization: Secondary | ICD-10-CM | POA: Insufficient documentation

## 2012-04-27 DIAGNOSIS — E119 Type 2 diabetes mellitus without complications: Secondary | ICD-10-CM | POA: Insufficient documentation

## 2012-04-27 DIAGNOSIS — E1165 Type 2 diabetes mellitus with hyperglycemia: Secondary | ICD-10-CM

## 2012-04-27 DIAGNOSIS — R404 Transient alteration of awareness: Secondary | ICD-10-CM | POA: Insufficient documentation

## 2012-04-27 LAB — URINALYSIS, ROUTINE W REFLEX MICROSCOPIC
Bilirubin Urine: NEGATIVE
Glucose, UA: 100 mg/dL — AB
Ketones, ur: 15 mg/dL — AB
Leukocytes, UA: NEGATIVE
Protein, ur: NEGATIVE mg/dL
pH: 6 (ref 5.0–8.0)

## 2012-04-27 LAB — CBC WITH DIFFERENTIAL/PLATELET
Basophils Relative: 0 % (ref 0–1)
Eosinophils Absolute: 0.2 10*3/uL (ref 0.0–0.7)
Hemoglobin: 12.7 g/dL (ref 12.0–15.0)
MCH: 28.2 pg (ref 26.0–34.0)
MCHC: 33.1 g/dL (ref 30.0–36.0)
Monocytes Relative: 10 % (ref 3–12)
Neutro Abs: 6.5 10*3/uL (ref 1.7–7.7)
Neutrophils Relative %: 64 % (ref 43–77)
Platelets: 159 10*3/uL (ref 150–400)
RBC: 4.5 MIL/uL (ref 3.87–5.11)

## 2012-04-27 LAB — COMPREHENSIVE METABOLIC PANEL
ALT: <5 U/L (ref 0–35)
AST: 13 U/L (ref 0–37)
Albumin: 3.2 g/dL — ABNORMAL LOW (ref 3.5–5.2)
Alkaline Phosphatase: 53 U/L (ref 39–117)
BUN: 14 mg/dL (ref 6–23)
Chloride: 100 mEq/L (ref 96–112)
Potassium: 3.7 mEq/L (ref 3.5–5.1)
Sodium: 138 mEq/L (ref 135–145)
Total Bilirubin: 0.8 mg/dL (ref 0.3–1.2)
Total Protein: 7 g/dL (ref 6.0–8.3)

## 2012-04-27 MED ORDER — PANTOPRAZOLE SODIUM 40 MG PO TBEC
40.0000 mg | DELAYED_RELEASE_TABLET | Freq: Every day | ORAL | Status: DC
  Administered 2012-04-28 – 2012-05-02 (×5): 40 mg via ORAL
  Filled 2012-04-27 (×4): qty 1

## 2012-04-27 MED ORDER — ENOXAPARIN SODIUM 40 MG/0.4ML ~~LOC~~ SOLN
40.0000 mg | SUBCUTANEOUS | Status: DC
  Administered 2012-04-28 – 2012-05-01 (×5): 40 mg via SUBCUTANEOUS
  Filled 2012-04-27 (×6): qty 0.4

## 2012-04-27 MED ORDER — ONDANSETRON HCL 4 MG PO TABS: 4.0000 mg | ORAL_TABLET | Freq: Four times a day (QID) | ORAL | Status: DC | PRN

## 2012-04-27 MED ORDER — ONDANSETRON HCL 4 MG/2ML IJ SOLN: 4.0000 mg | Freq: Four times a day (QID) | INTRAMUSCULAR | Status: DC | PRN

## 2012-04-27 MED ORDER — INSULIN ASPART 100 UNIT/ML ~~LOC~~ SOLN
0.0000 [IU] | Freq: Three times a day (TID) | SUBCUTANEOUS | Status: DC
  Administered 2012-04-28: 3 [IU] via SUBCUTANEOUS
  Administered 2012-04-28: 1 [IU] via SUBCUTANEOUS
  Administered 2012-04-29: 2 [IU] via SUBCUTANEOUS
  Administered 2012-04-29: 1 [IU] via SUBCUTANEOUS
  Administered 2012-04-29: 2 [IU] via SUBCUTANEOUS
  Administered 2012-04-30: 3 [IU] via SUBCUTANEOUS
  Administered 2012-04-30: 2 [IU] via SUBCUTANEOUS
  Administered 2012-05-01: 1 [IU] via SUBCUTANEOUS
  Administered 2012-05-01: 2 [IU] via SUBCUTANEOUS
  Administered 2012-05-01: 1 [IU] via SUBCUTANEOUS
  Administered 2012-05-02: 13:00:00 via SUBCUTANEOUS

## 2012-04-27 MED ORDER — AMLODIPINE BESYLATE 2.5 MG PO TABS
2.5000 mg | ORAL_TABLET | Freq: Every day | ORAL | Status: DC
  Filled 2012-04-27: qty 1

## 2012-04-27 MED ORDER — SODIUM CHLORIDE 0.9 % IV SOLN: INTRAVENOUS | Status: DC

## 2012-04-27 MED ORDER — LISINOPRIL 5 MG PO TABS
5.0000 mg | ORAL_TABLET | Freq: Every day | ORAL | Status: DC
  Filled 2012-04-27: qty 1

## 2012-04-27 MED ORDER — ACETAMINOPHEN 325 MG PO TABS
650.0000 mg | ORAL_TABLET | Freq: Four times a day (QID) | ORAL | Status: DC | PRN
  Administered 2012-04-28 – 2012-05-02 (×4): 650 mg via ORAL
  Filled 2012-04-27 (×5): qty 2

## 2012-04-27 MED ORDER — HYOSCYAMINE SULFATE 0.125 MG PO TABS
0.1250 mg | ORAL_TABLET | ORAL | Status: DC | PRN
  Filled 2012-04-27: qty 1

## 2012-04-27 MED ORDER — HYDRALAZINE HCL 20 MG/ML IJ SOLN
10.0000 mg | Freq: Four times a day (QID) | INTRAMUSCULAR | Status: DC | PRN
  Administered 2012-04-28: 10 mg via INTRAVENOUS
  Filled 2012-04-27 (×2): qty 0.5

## 2012-04-27 MED ORDER — SODIUM CHLORIDE 0.9 % IV SOLN
Freq: Once | INTRAVENOUS | Status: AC
  Administered 2012-04-27: 16:00:00 via INTRAVENOUS

## 2012-04-27 MED ORDER — ACETAMINOPHEN 650 MG RE SUPP: 650.0000 mg | Freq: Four times a day (QID) | RECTAL | Status: DC | PRN

## 2012-04-27 MED ORDER — LEVOTHYROXINE SODIUM 50 MCG PO TABS
50.0000 ug | ORAL_TABLET | Freq: Every day | ORAL | Status: DC
  Administered 2012-04-28 – 2012-05-02 (×5): 50 ug via ORAL
  Filled 2012-04-27 (×5): qty 1

## 2012-04-27 MED ORDER — BISOPROLOL-HYDROCHLOROTHIAZIDE 5-6.25 MG PO TABS
1.0000 | ORAL_TABLET | Freq: Every day | ORAL | Status: DC
  Administered 2012-04-28 – 2012-05-02 (×5): 1 via ORAL
  Filled 2012-04-27 (×5): qty 1

## 2012-04-27 NOTE — Progress Notes (Signed)
Per consult from Dr. Estell Harpin, I contacted [facility] for patient placement.

## 2012-04-27 NOTE — ED Notes (Signed)
Attempted IV x 3 without success.  IV team paged for IV start.

## 2012-04-27 NOTE — Progress Notes (Signed)
  BRIEF PSYCHOSOCIAL ASSESSMENT 04/27/2012  Patient:  Megan [name], Megan [name]     Account Number:  [id number]     Admit date:  04/27/2012  Clinical Social Worker:  Illene Silver  Date/Time:  04/27/2012 09:00 PM  Referred by:  Physician  Date Referred:  04/27/2012 Referred for  SNF Placement   Other Referral:   Interview type:  Family Other interview type:    PSYCHOSOCIAL DATA Living Status:  FAMILY Admitted from facility:   Level of care:   Primary support name:  Megan [name], Megan [name] Primary support relationship to patient:  CHILD, ADULT Degree of support available:   Pt's daughters (as above) spend 7 days a week/24 hours caring for pt. Pt's husband (bilateral amputee) has his own caregiver.    CURRENT CONCERNS Current Concerns  Post-Acute Placement   Other Concerns:    SOCIAL WORK ASSESSMENT / PLAN Spoke with pt's daughters re: placement.  Family is undecided on plan of care for pt.  Her husband cannot care for herb/c of hx of strokes and bilateral amputations.  Pt has several children and they all plan to discuss care of pt this week.  Family is not agreeable to NHP at this time. They would like to see what the medical workup shows and what the MD rec. They also want to talk amongst themselves. Pt has no HCPOA, but all family members are expected to agree on plan.   Assessment/plan status:  Psychosocial Support/Ongoing Assessment of Needs Other assessment/ plan:   Information/referral to community resources:    PATIENT'S/FAMILY'S RESPONSE TO PLAN OF CARE: Pt sleeping/?dementia.  Daughters anxious re: what is going on with pt.  Emotional support provided.  Explained that CSW would be available to them/following pt.  Megan [name] can be reached at [phone] and Megan can be reached at [phone].

## 2012-04-27 NOTE — ED Notes (Signed)
Pt presents with generalized weakness X 1 month with an increase in intensity and lack of appetite X 1 week. Per family pt is baseline. Pt was admitted to hospital for ulcer 1 month earlier

## 2012-04-27 NOTE — ED Provider Notes (Signed)
History     CSN: [id number]  Arrival date & time 04/27/12  1315   First MD Initiated Contact with Patient 04/27/12 1355      Chief Complaint  Patient presents with  . Weakness    (Consider location/radiation/quality/duration/timing/severity/associated sxs/prior treatment) Patient is a 78 y.o. female presenting with weakness. The history is provided by a relative (pt not eating or drinking for 1 week). No language interpreter was used.  Weakness The symptoms began more than 1 week ago. The symptoms are unchanged. The neurological symptoms are multifocal. Context: nothing.  Additional symptoms include weakness. Medical issues do not include seizures. Workup history includes CT scan.    Past Medical History  Diagnosis Date  . Hypertension   . Diastolic dysfunction, left ventricle by ECHO 2011 03/25/2012  . Hypothyroidism 03/25/2012  . High cholesterol   . Type II diabetes mellitus   . H/O hiatal hernia   . Duodenal perforation 03/23/12  . Arthritis     "in my back"    Past Surgical History  Procedure Date  . Bladder tacked   . Repair perforated ulcer 03/23/12  . Diagnostic laparoscopy 03/25/12    REPAIR OF PERFORATED ULCER with omental patch  . Abcess drainage 03/25/12     of abdominal & pelvic abscesses  . Laparoscopy 03/24/2012    Procedure: LAPAROSCOPY DIAGNOSTIC;  Surgeon: Ardeth Sportsman, MD;  Location: [facility] OR;  Service: General;  Laterality: N/A;  drainage of abdominal and pelvic abcesses    History reviewed. No pertinent family history.  History  Substance Use Topics  . Smoking status: Never Smoker   . Smokeless tobacco: Current User    Types: Snuff   Comment: 03/28/12 "use snuff q once in while; not regular"  . Alcohol Use: No    OB History    Grav Para Term Preterm Abortions TAB SAB Ect Mult Living                  Review of Systems  Unable to perform ROS: Mental status change  Neurological: Positive for weakness.    Allergies  Review of patient's  allergies indicates no known allergies.  Home Medications   Current Outpatient Rx  Name Route Sig Dispense Refill  . AMLODIPINE BESYLATE 5 MG PO TABS Oral Take 5 mg by mouth daily.    Marland Kitchen BISOPROLOL-HYDROCHLOROTHIAZIDE 5-6.25 MG PO TABS Oral Take 1 tablet by mouth daily.    Marland Kitchen HYOSCYAMINE SULFATE 0.125 MG PO TABS Oral Take 0.125 mg by mouth every 4 (four) hours as needed. For cramping/bloating/diarrhea    . LEVOTHYROXINE SODIUM 50 MCG PO TABS Oral Take 50 mcg by mouth daily.    Marland Kitchen LISINOPRIL 20 MG PO TABS Oral Take 20 mg by mouth daily.    Marland Kitchen PANTOPRAZOLE SODIUM 40 MG PO TBEC Oral Take 40 mg by mouth daily.    . QUETIAPINE FUMARATE ER 50 MG PO TB24 Oral Take 50 mg by mouth daily.      BP 192/64  Pulse 62  Temp 97.5 F (36.4 C) (Oral)  SpO2 99%  Physical Exam  Constitutional: She appears well-developed.  HENT:  Head: Normocephalic and atraumatic.  Eyes: Conjunctivae and EOM are normal. No scleral icterus.  Neck: Neck supple. No thyromegaly present.  Cardiovascular: Normal rate and regular rhythm.  Exam reveals no gallop and no friction rub.   No murmur heard. Pulmonary/Chest: No stridor. She has no wheezes. She has no rales. She exhibits no tenderness.  Abdominal: She exhibits no distension.  There is no tenderness. There is no rebound.  Musculoskeletal: Normal range of motion. She exhibits no edema.  Lymphadenopathy:    She has no cervical adenopathy.  Neurological:       Moves all extr.  Pt lethargic and oriented to person only  Skin: No rash noted. No erythema.  Psychiatric: She has a normal mood and affect. Her behavior is normal.    ED Course  Procedures (including critical care time)  Labs Reviewed  COMPREHENSIVE METABOLIC PANEL - Abnormal; Notable for the following:    Glucose, Bld 115 (*)     Albumin 3.2 (*)     GFR calc non Af Amer 85 (*)     All other components within normal limits  URINALYSIS, ROUTINE W REFLEX MICROSCOPIC - Abnormal; Notable for the following:      Glucose, UA 100 (*)     Hgb urine dipstick SMALL (*)     Ketones, ur 15 (*)     All other components within normal limits  CBC WITH DIFFERENTIAL  URINE MICROSCOPIC-ADD ON   Dg Chest 2 View  04/25/2012  *RADIOLOGY REPORT*  Clinical Data: 78 year old female with confusion, altered level of consciousness.  CHEST - 2 VIEW  Comparison: 04/01/2012 and earlier.  Findings: Semi upright AP and lateral views of the chest.  Interval decreased but not completely resolved small pleural effusions. Interval improved bibasilar ventilation.  No areas of worsening ventilation.  No pneumothorax or edema. No acute osseous abnormality identified.  IMPRESSION: Decreased bilateral pleural effusions, small residuals.  Improved basilar ventilation with no new cardiopulmonary abnormality.  Original Report Authenticated By: Harley Hallmark, M.D.   Ct Head Wo Contrast  04/25/2012  *RADIOLOGY REPORT*  Clinical Data: 78 year old female with altered mental status confusion and pain.  CT HEAD WITHOUT CONTRAST  Technique:  Contiguous axial images were obtained from the base of the skull through the vertex without contrast.  Comparison: 03/24/2012 and earlier.  Findings: Visualized paranasal sinuses and mastoids are clear. Hyperostosis frontalis. No acute osseous abnormality identified. Visualized orbits and scalp soft tissues are within normal limits.  Calcified atherosclerosis at the skull base.  Stable cerebral volume.  No ventriculomegaly.  Confluent cerebral white matter hypodensity is stable. No midline shift, mass effect, or evidence of mass lesion.  No acute intracranial hemorrhage identified.  No suspicious intracranial vascular hyperdensity. No evidence of cortically based acute infarction identified.  Stable dural calcifications.  IMPRESSION: Stable chronic white matter disease. No acute intracranial abnormality.  Original Report Authenticated By: Harley Hallmark, M.D.     No diagnosis found.    MDM           Benny Lennert, MD 04/27/12 [phone]

## 2012-04-27 NOTE — ED Notes (Signed)
Pt to MRI

## 2012-04-27 NOTE — ED Notes (Signed)
Pt ready for transport to rm 3041. Pt return from MRI

## 2012-04-27 NOTE — H&P (Signed)
Patient's PCP: [name], MD  Chief Complaint: Altered mental status  History of Present Illness: Megan [name] is a 78 y.o. African American female with recent history of peritonitis status post laparoscopic surgery to repair perforated ulcer on 03/25/2012, hypertension, type 2 diabetes, hiatal hernia, and arthritis who presented today with altered mental status.  Patient was sleepy but arousable, 2 daughters by bedside who provided most of the history.  Per daughters after surgery patient was discharged home with home health and had been able to maintain good appetite.  However since 04/25/2012 patient has been having excess sleepiness and has been confused.  She was taken to her primary care physician's office yesterday and was prescribed Seroquel.  Yesterday she was not able to take her medications and may or may not have gotten Seroquel.  Today she did get a dose of Seroquel.  Given her excess somnolence she was brought to the emergency department for further evaluation.  The hospitalist service was asked to admit the patient for further care and management.  Her daughters has not had any fevers, chills, chest pain or shortness of breath.  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  Past Medical History  Diagnosis Date  . Hypertension   . Diastolic dysfunction, left ventricle by ECHO 2011 03/25/2012  . Hypothyroidism 03/25/2012  . High cholesterol   . Type II diabetes mellitus   . H/O hiatal hernia   . Duodenal perforation 03/23/12  . Arthritis     "in my back"   Past Surgical History  Procedure Date  . Bladder tacked   . Repair perforated ulcer 03/23/12  . Diagnostic laparoscopy 03/25/12    REPAIR OF PERFORATED ULCER with omental patch  . Abcess drainage 03/25/12     of abdominal & pelvic abscesses  . Laparoscopy 03/24/2012    Procedure: LAPAROSCOPY DIAGNOSTIC;  Surgeon: Ardeth Sportsman, MD;  Location: [facility] OR;  Service: General;  Laterality: N/A;   drainage of abdominal and pelvic abcesses   History reviewed. No pertinent family history. History   Social History  . Marital Status: Married    Spouse Name: N/A    Number of Children: N/A  . Years of Education: N/A   Occupational History  . Not on file.   Social History Main Topics  . Smoking status: Never Smoker   . Smokeless tobacco: Current User    Types: Snuff   Comment: 03/28/12 "use snuff q once in while; not regular"  . Alcohol Use: No  . Drug Use: No  . Sexually Active: Not Currently   Other Topics Concern  . Not on file   Social History Narrative   Ms. Kassem is married.  Her religion is [employer].  [place] not use alcohol or illegal drugs.  She lives at home with herhusband.Her daughter and granddaughter are involved.  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  She has been using a Quadra-Ped cane but does not have a walker at home.   Allergies: Review of patient's allergies indicates no known allergies.  Meds: Scheduled Meds:   . sodium chloride   Intravenous Once   Continuous Infusions:   . sodium chloride     PRN Meds:.  Family history: Cannot be obtained from the patient due to her confusion.  Review of Systems: Cannot be obtained from the patient due to confusion.  Physical Exam: Blood pressure 165/52, pulse 66, temperature 97.5  F (36.4 C), temperature source Oral, resp. rate 18, SpO2 98.00%. General: Somnolent but easily arousable, not oriented x3, No acute distress. HEENT: EOMI, Moist mucous membranes Neck: Supple CV: S1 and S2 Lungs: Clear to ascultation bilaterally Abdomen: Soft, Nontender, Nondistended, +bowel sounds. Ext: Good pulses. Trace edema. No clubbing or cyanosis noted. Neuro: Cannot be done due to patient's compliance.  Lab results:  [facility] 04/27/12 1531 04/25/12 2316  NA 138 137  K 3.7 3.6  CL 100 102  CO2 27 26  GLUCOSE 115* 107*  BUN 14  13  CREATININE 0.56 0.64  CALCIUM 9.8 9.7  MG -- --  PHOS -- --    Basename 04/27/12 1531 04/25/12 2316  AST 13 12  ALT <5 <5  ALKPHOS 53 51  BILITOT 0.8 0.4  PROT 7.0 6.5  ALBUMIN 3.2* 3.0*   No results found for this basename: LIPASE:2,AMYLASE:2 in the last 72 hours  Basename 04/27/12 1531 04/25/12 2316  WBC 10.1 9.8  NEUTROABS 6.5 5.1  HGB 12.7 10.8*  HCT 38.4 33.5*  MCV 85.3 85.9  PLT 159 161    Basename 04/25/12 2316  CKTOTAL 34  CKMB --  CKMBINDEX --  TROPONINI <0.30   No components found with this basename: POCBNP:3 No results found for this basename: DDIMER in the last 72 hours No results found for this basename: HGBA1C:2 in the last 72 hours No results found for this basename: CHOL:2,HDL:2,LDLCALC:2,TRIG:2,CHOLHDL:2,LDLDIRECT:2 in the last 72 hours No results found for this basename: TSH,T4TOTAL,FREET3,T3FREE,THYROIDAB in the last 72 hours No results found for this basename: VITAMINB12:2,FOLATE:2,FERRITIN:2,TIBC:2,IRON:2,RETICCTPCT:2 in the last 72 hours Imaging results:  Dg Chest 2 View  04/25/2012  *RADIOLOGY REPORT*  Clinical Data: 78 year old female with confusion, altered level of consciousness.  CHEST - 2 VIEW  Comparison: 04/01/2012 and earlier.  Findings: Semi upright AP and lateral views of the chest.  Interval decreased but not completely resolved small pleural effusions. Interval improved bibasilar ventilation.  No areas of worsening ventilation.  No pneumothorax or edema. No acute osseous abnormality identified.  IMPRESSION: Decreased bilateral pleural effusions, small residuals.  Improved basilar ventilation with no new cardiopulmonary abnormality.  Original Report Authenticated By: Harley Hallmark, M.D.   Dg Chest 2 View  04/01/2012  *RADIOLOGY REPORT*  Clinical Data: 78 year old female with low grade fever.  Abdominal pain, leukocytosis, hypertension diabetes.  CHEST - 2 VIEW  Comparison: 03/27/2012 and earlier.  Findings: Enteric tube has been  removed. Right lung base chest tube has been removed.  Right IJ central line has been removed.  No pneumothorax.  Small bilateral pleural effusions.  Decreased right perihilar airspace disease with mild residual which most resembles atelectasis.   No pulmonary edema. Stable cardiomegaly and mediastinal contours.  No acute osseous abnormality identified.  IMPRESSION: 1.  Lines and tubes removed.  No pneumothorax. 2.  Bilateral pleural effusions.  Decreased right perihilar opacity, probable atelectasis.  Original Report Authenticated By: Harley Hallmark, M.D.   Dg Abd 1 View  03/31/2012  *RADIOLOGY REPORT*  Clinical Data: Abdominal pain and worsening leukocytosis.  Recent pneumoperitoneum and peritonitis.  ABDOMEN - 1 VIEW  Comparison: CT dated 02/24/2012.  Findings: Normal bowel gas pattern.  Curvilinear calcifications in a left renal cyst seen on the previous CT.  Sclerosis on both sides of the right sacroiliac joint.  Lumbar spine degenerative changes and right hip degenerative changes.  No gross pneumoperitoneum.  IMPRESSION:  1.  No acute abnormality. 2.  Previously noted left renal Bosniak category II F lesion.  3.  Lumbar spine degenerative changes, right sacroiliitis and right hip degenerative changes.  Original Report Authenticated By: Darrol Angel, M.D.   Ct Head Wo Contrast  04/25/2012  *RADIOLOGY REPORT*  Clinical Data: 78 year old female with altered mental status confusion and pain.  CT HEAD WITHOUT CONTRAST  Technique:  Contiguous axial images were obtained from the base of the skull through the vertex without contrast.  Comparison: 03/24/2012 and earlier.  Findings: Visualized paranasal sinuses and mastoids are clear. Hyperostosis frontalis. No acute osseous abnormality identified. Visualized orbits and scalp soft tissues are within normal limits.  Calcified atherosclerosis at the skull base.  Stable cerebral volume.  No ventriculomegaly.  Confluent cerebral white matter hypodensity is stable. No  midline shift, mass effect, or evidence of mass lesion.  No acute intracranial hemorrhage identified.  No suspicious intracranial vascular hyperdensity. No evidence of cortically based acute infarction identified.  Stable dural calcifications.  IMPRESSION: Stable chronic white matter disease. No acute intracranial abnormality.  Original Report Authenticated By: Harley Hallmark, M.D.   Other results:   Assessment & Plan by Problem: Acute delirium/Altered mental status/Encephalopathy Etiology unclear.  Head CT done with results as indicated above.  Will get an MRI of the brain to rule out any stroke.  Hold Seroquel as this medication can be sedating.  Infectious workup negative with negative urinalysis and chest x-ray.  Per discussion with patient's daughters, has been declining mentally over the last few years, uncertain if patient has underlying dementia which has progressed.  Type 2 diabetes with complications Has been taken off of diabetic medications recently.  Sensitive SSI.  Recent peritonitis Will get abdominal x-ray for evaluation.  Does not appear to be an active issue at this time.  Hypertension Stable.  Continue home medications but at low doses.  Arthritis Stable  Hypothyroidism Continue synthroid.  Prophylaxis Lovenox.  CODE STATUS Full code.  Time spent on admission, talking to the patient, and coordinating care was: 60 mins.  Dow Blahnik A, MD 04/27/2012, 5:56 PM

## 2012-04-28 ENCOUNTER — Encounter (HOSPITAL_COMMUNITY): Payer: Self-pay | Admitting: *Deleted

## 2012-04-28 DIAGNOSIS — I10 Essential (primary) hypertension: Secondary | ICD-10-CM

## 2012-04-28 DIAGNOSIS — F05 Delirium due to known physiological condition: Secondary | ICD-10-CM

## 2012-04-28 DIAGNOSIS — E1165 Type 2 diabetes mellitus with hyperglycemia: Secondary | ICD-10-CM

## 2012-04-28 DIAGNOSIS — E032 Hypothyroidism due to medicaments and other exogenous substances: Secondary | ICD-10-CM

## 2012-04-28 LAB — BASIC METABOLIC PANEL
CO2: 24 mEq/L (ref 19–32)
Calcium: 9.3 mg/dL (ref 8.4–10.5)
Chloride: 102 mEq/L (ref 96–112)
Glucose, Bld: 121 mg/dL — ABNORMAL HIGH (ref 70–99)
Potassium: 3.9 mEq/L (ref 3.5–5.1)
Sodium: 137 mEq/L (ref 135–145)

## 2012-04-28 LAB — FOLATE: Folate: 11.6 ng/mL

## 2012-04-28 LAB — CBC
Hemoglobin: 11.3 g/dL — ABNORMAL LOW (ref 12.0–15.0)
MCH: 28.5 pg (ref 26.0–34.0)
MCV: 85.6 fL (ref 78.0–100.0)
Platelets: 131 10*3/uL — ABNORMAL LOW (ref 150–400)
RBC: 3.97 MIL/uL (ref 3.87–5.11)
WBC: 9 10*3/uL (ref 4.0–10.5)

## 2012-04-28 LAB — GLUCOSE, CAPILLARY: Glucose-Capillary: 137 mg/dL — ABNORMAL HIGH (ref 70–99)

## 2012-04-28 LAB — AMMONIA: Ammonia: 14 umol/L (ref 11–60)

## 2012-04-28 LAB — VITAMIN B12: Vitamin B-12: 451 pg/mL (ref 211–911)

## 2012-04-28 MED ORDER — LISINOPRIL 20 MG PO TABS
20.0000 mg | ORAL_TABLET | Freq: Every day | ORAL | Status: DC
  Administered 2012-04-28 – 2012-04-29 (×2): 20 mg via ORAL
  Filled 2012-04-28 (×4): qty 1

## 2012-04-28 MED ORDER — AMLODIPINE BESYLATE 5 MG PO TABS
5.0000 mg | ORAL_TABLET | Freq: Every day | ORAL | Status: DC
  Administered 2012-04-28 – 2012-04-29 (×2): 5 mg via ORAL
  Filled 2012-04-28 (×4): qty 1

## 2012-04-28 NOTE — Evaluation (Signed)
Clinical/Bedside Swallow Evaluation Patient Details  Name: Megan [name] MRN: [id number] Date of Birth: [birth year]  Today's Date: 04/28/2012 Time: 2725-3664 SLP Time Calculation (min): 14 min  Past Medical History:  Past Medical History  Diagnosis Date  . Hypertension   . Diastolic dysfunction, left ventricle by ECHO 2011 03/25/2012  . Hypothyroidism 03/25/2012  . High cholesterol   . Type II diabetes mellitus   . H/O hiatal hernia   . Duodenal perforation 03/23/12  . Arthritis     "in my back"   Past Surgical History:  Past Surgical History  Procedure Date  . Bladder tacked   . Repair perforated ulcer 03/23/12  . Diagnostic laparoscopy 03/25/12    REPAIR OF PERFORATED ULCER with omental patch  . Abcess drainage 03/25/12     of abdominal & pelvic abscesses  . Laparoscopy 03/24/2012    Procedure: LAPAROSCOPY DIAGNOSTIC;  Surgeon: Ardeth Sportsman, MD;  Location: [facility] [place] OR;  Service: General;  Laterality: N/A;  drainage of abdominal and pelvic abcesses   HPI:  78 yr old admitted with AMS.  Recent surgery 03/25/12 from perforated ulcer.  History of DM 2, hiatal hernia.  RN reported pt. having diffculty orally manipulating food at times.   Assessment / Plan / Recommendation Clinical Impression  Pt. exhibited mild-mod oral dysphagia with decreased oral preparation, bolus formation and transit with solid and left buccal cavity pocketing.   Pt. able to clear majority of pocketed food with verbal and visual cues.  No s/s aspiration with liquids or solids.  Recommend downgrade diet to Dys 3 (with chopped meats), thin liquids and pills whole in applesauce or crush if difficulty.    Aspiration Risk  Mild    Diet Recommendation Dysphagia 3 (Mechanical Soft);Thin liquid   Liquid Administration via: Straw;Cup Medication Administration: Whole meds with puree Supervision: Patient able to self feed;Full supervision/cueing for compensatory strategies Compensations: Small sips/bites;Slow  rate Postural Changes and/or Swallow Maneuvers: Seated upright 90 degrees;Upright 30-60 min after meal    Other  Recommendations Oral Care Recommendations: Oral care BID   Follow Up Recommendations  None    Frequency and Duration min 1 x/week  2 weeks       SLP Swallow Goals Patient will consume recommended diet without observed clinical signs of aspiration with: Minimal cueing Patient will utilize recommended strategies during swallow to increase swallowing safety with: Minimal cueing   Swallow Study Prior Functional Status       General HPI: 78 yr old admitted with AMS.  Recent surgery 03/25/12 from perforated ulcer.  History of DM 2, hiatal hernia.  RN reported pt. having diffculty orally manipulating food at times. Type of Study: Bedside swallow evaluation Diet Prior to this Study: Regular;Thin liquids Temperature Spikes Noted: No Respiratory Status: Room air Behavior/Cognition: Lethargic;Cooperative Oral Cavity - Dentition: Poor condition;Missing dentition (Missing some upper rear) Self-Feeding Abilities: Able to feed self;Needs assist Patient Positioning: Upright in bed Baseline Vocal Quality: Clear Volitional Cough:  (not assessed due to pt's intermittent distraction and partic)    Oral/Motor/Sensory Function Overall Oral Motor/Sensory Function: Impaired Labial ROM: Reduced left Labial Symmetry: Abnormal symmetry left Labial Strength: Reduced Lingual ROM:  (did not cooperate)   Ice Chips Ice chips: Not tested   Thin Liquid Thin Liquid: Within functional limits Presentation: Straw    Nectar Thick Nectar Thick Liquid: Not tested   Honey Thick Honey Thick Liquid: Not tested   Puree Puree: Within functional limits   Solid Solid: Impaired Oral Phase Impairments: Impaired anterior  to posterior transit;Reduced lingual movement/coordination Oral Phase Functional Implications: Left lateral sulci pocketing    Megan [name] Megan [name] M.Ed [employer]  [phone]  04/28/2012

## 2012-04-28 NOTE — Evaluation (Signed)
Physical Therapy Evaluation Patient Details Name: Megan [name] MRN: [id number] DOB: [birth year] Today's Date: 04/28/2012 Time: 8295-6213 PT Time Calculation (min): 10 min  PT Assessment / Plan / Recommendation Clinical Impression  Pt presents with acute delerium and impaired cognition and speech. Pt with significant functional decline, decreased strength and safety.  Pt will benefit from skilled PT in the acute care setting in order to maximize functional mobility prior to d/c to SNF    PT Assessment  Patient needs continued PT services    Follow Up Recommendations  Skilled nursing facility    Barriers to Discharge        Equipment Recommendations  Defer to next venue    Recommendations for Other Services     Frequency Min 3X/week    Precautions / Restrictions Precautions Precautions: Fall Restrictions Weight Bearing Restrictions: No         Mobility  Bed Mobility Bed Mobility: Rolling Left Rolling Left: 1: +2 Total assist Rolling Left: Patient Percentage: 0% Details for Bed Mobility Assistance: +2 assist to roll in order to adjust linens beneath pt. Pt resistent to bed mobility. Transfers Transfers: Not assessed    Exercises     PT Diagnosis: Difficulty walking  PT Problem List: Decreased strength;Decreased activity tolerance;Decreased mobility;Decreased knowledge of use of DME;Decreased safety awareness;Decreased knowledge of precautions PT Treatment Interventions: DME instruction;Gait training;Functional mobility training;Therapeutic activities;Therapeutic exercise;Neuromuscular re-education;Patient/family education;Cognitive remediation   PT Goals Acute Rehab PT Goals PT Goal Formulation: Patient unable to participate in goal setting Time For Goal Achievement: 05/05/12 Potential to Achieve Goals: Fair Pt will go Supine/Side to Sit: with min assist PT Goal: Supine/Side to Sit - Progress: Goal set today Pt will go Sit to Supine/Side: with min assist PT Goal:  Sit to Supine/Side - Progress: Goal set today Pt will go Sit to Stand: with min assist PT Goal: Sit to Stand - Progress: Goal set today Pt will go Stand to Sit: with min assist PT Goal: Stand to Sit - Progress: Goal set today Pt will Transfer Bed to Chair/Chair to Bed: with mod assist PT Transfer Goal: Bed to Chair/Chair to Bed - Progress: Goal set today Pt will Ambulate: with mod assist;with least restrictive assistive device;51 - 150 feet PT Goal: Ambulate - Progress: Goal set today  Visit Information  Last PT Received On: 04/28/12 Assistance Needed: +2 PT/OT Co-Evaluation/Treatment: Yes    Subjective Data      Prior Functioning  Home Living Additional Comments: Pt unable to provide home living information due to cognitive deficits.  Caregivers unavailable to provide information. Prior Function Comments: Pt unable to provide PLOF information due to cognitive deficits. Caregivers unavailable to provide information. Communication Communication:  (frequently mumbles, difficult to understand)    Cognition  Overall Cognitive Status: Impaired Area of Impairment: Attention;Following commands Arousal/Alertness: Lethargic Orientation Level: Disoriented to;Place;Time Behavior During Session: Agitated Current Attention Level: Focused Following Commands: Follows one step commands inconsistently Cognition - Other Comments: Pt frequently with eyes closed and unwilling to respond to therapist.  Pt reports she is in her bed at home.    Extremity/Trunk Assessment Right Upper Extremity Assessment RUE ROM/Strength/Tone: Unable to fully assess;Due to impaired cognition Left Upper Extremity Assessment LUE ROM/Strength/Tone: Unable to fully assess;Due to impaired cognition Right Lower Extremity Assessment RLE ROM/Strength/Tone: Unable to fully assess;Due to impaired cognition Left Lower Extremity Assessment LLE ROM/Strength/Tone: Unable to fully assess;Due to impaired cognition   Balance      End of Session PT - End of Session Activity  Tolerance: Treatment limited secondary to agitation;Patient limited by fatigue Patient left: in bed;with call bell/phone within reach;with bed alarm set Nurse Communication: Mobility status  GP Functional Assessment Tool Used: Clinical Judgement Functional Limitation: Mobility: Walking and moving around Mobility: Walking and Moving Around Current Status ([id number]): At least 80 percent but less than 100 percent impaired, limited or restricted Mobility: Walking and Moving Around Goal Status [phone]): At least 40 percent but less than 60 percent impaired, limited or restricted   Milana Kidney 04/28/2012, 4:46 PM  04/28/2012 Milana Kidney DPT PAGER: [phone] OFFICE: [phone]

## 2012-04-28 NOTE — Progress Notes (Signed)
Utilization Review Completed.Farhad Burleson T6/27/2013   

## 2012-04-28 NOTE — Clinical Social Work Note (Addendum)
CSW and RNCM met with pt's family to address placement options. Pt's family expressed concerns with insurance coverage. Pt's family reported that pt received home health services and they have ended pt's treatment prior to admission. CSW and RNCM addressed the potential concerns with insurance coverage. CSW provided as list of SNF and ALF. RNCM provided list of Private Duty Care Agencies. CSW will follow up with insurance company to inquire about SNF coverage as pt is considered "observation" however pt had a recent admission. CSW will initiate SNF search once PT and OT have completed evaluations and made discharge recommendations. CSW will continue to follow.   Dede Query, MSW, Theresia Majors [phone]  Addendum: CSW contacted Zelda with [employer] to inquire about insurance coverage. Per Zelda, pt does not need a 3 night qualifying stay nor does her observation classification cause an issue for coverage of a SNF. However, PT and OT must recommend SNF.  Dede Query, MSW, Theresia Majors

## 2012-04-28 NOTE — Progress Notes (Signed)
Occupational Therapy Evaluation Patient Details Name: Megan [name] MRN: [id number] DOB: 17-Jun-1927 Today's Date: 04/28/2012 Time: 1308-6578 OT Time Calculation (min): 12 min  OT Assessment / Plan / Recommendation Clinical Impression  Pt admitted with altered mental status. Recent surgery 03/25/12 from perforated ulcer. Will benefit from acute OT to address below problem list in prep for d/c to SNF.     OT Assessment  Patient needs continued OT Services    Follow Up Recommendations  Skilled nursing facility    Barriers to Discharge      Equipment Recommendations  Defer to next venue    Recommendations for Other Services Speech consult  Frequency  Min 2X/week    Precautions / Restrictions     Pertinent Vitals/Pain See vitals    ADL  Grooming: Performed;Applying makeup;Wash/dry face;Wash/dry hands;Minimal assistance Where Assessed - Grooming: Supine, head of bed up Lower Body Dressing: Performed;+1 Total assistance Where Assessed - Lower Body Dressing: Supine, head of bed up Transfers/Ambulation Related to ADLs: Pt refusing OOB activity. ADL Comments: ADL assessment limited due to pt becoming increasingly agitated with encouragement to participate and refusing to sit EOB. Pt required min assist with grooming due to attempting to apply chapstick with cap on and then tube upside down. +1 assist to don bil socks.    OT Diagnosis: Generalized weakness;Altered mental status;Cognitive deficits  OT Problem List: Decreased strength;Decreased activity tolerance;Decreased cognition OT Treatment Interventions: Self-care/ADL training;Therapeutic activities;Patient/family education;Cognitive remediation/compensation   OT Goals Acute Rehab OT Goals OT Goal Formulation: Patient unable to participate in goal setting Time For Goal Achievement: 05/12/12 Potential to Achieve Goals: Fair ADL Goals Pt Will Perform Grooming: with supervision;Sitting, edge of bed ADL Goal: Grooming - Progress:  Goal set today Pt Will Perform Upper Body Bathing: with supervision;Sitting, chair;Sitting, edge of bed ADL Goal: Upper Body Bathing - Progress: Goal set today Pt Will Perform Lower Body Bathing: with min assist;Sit to stand from chair;Sit to stand from bed ADL Goal: Lower Body Bathing - Progress: Goal set today Pt Will Transfer to Toilet: with max assist;Stand pivot transfer;with DME;3-in-1 ADL Goal: Toilet Transfer - Progress: Goal set today Miscellaneous OT Goals Miscellaneous OT Goal #1: Pt will be able to orient self to time and place with use of environmental cues with min verbal cueing. OT Goal: Miscellaneous Goal #1 - Progress: Goal set today  Visit Information  Last OT Received On: 04/28/12 Assistance Needed: +2 PT/OT Co-Evaluation/Treatment: Yes    Subjective Data  Subjective: I am in my bed at home.   Prior Functioning  Home Living Additional Comments: Pt unable to provide home living information due to cognitive deficits.  Caregivers unavailable to provide information. Prior Function Comments: Pt unable to provide PLOF information due to cognitive deficits. Caregivers unavailable to provide information. Communication Communication:  (frequently mumbles, difficult to understand)    Cognition  Overall Cognitive Status: Impaired Area of Impairment: Attention;Following commands Arousal/Alertness: Lethargic Orientation Level: Disoriented to;Place;Time Behavior During Session: Agitated Current Attention Level: Focused Following Commands: Follows one step commands inconsistently Cognition - Other Comments: Pt frequently with eyes closed and unwilling to respond to therapist.  Pt reports she is in her bed at home.    Extremity/Trunk Assessment Right Upper Extremity Assessment RUE ROM/Strength/Tone: Unable to fully assess;Due to impaired cognition Left Upper Extremity Assessment LUE ROM/Strength/Tone: Unable to fully assess;Due to impaired cognition   Mobility Bed  Mobility Bed Mobility: Rolling Left Rolling Left: 1: +2 Total assist Rolling Left: Patient Percentage: 0% Details for Bed Mobility Assistance: +  2 assist to roll in order to adjust linens beneath pt. Pt resistent to bed mobility.   Exercise    Balance    End of Session OT - End of Session Activity Tolerance: Treatment limited secondary to agitation Patient left: in bed;with call bell/phone within reach;with bed alarm set Nurse Communication: Other (comment) (agitated)  GO Functional Assessment Tool Used: clinical judgement Functional Limitation: Self care Self Care Current Status (Z6109): At least 80 percent but less than 100 percent impaired, limited or restricted Self Care Goal Status (U0454): At least 40 percent but less than 60 percent impaired, limited or restricted   04/28/2012 Cipriano Mile OTR/L Pager [phone] Office [phone]  Cipriano Mile 04/28/2012, 4:30 PM

## 2012-04-28 NOTE — Progress Notes (Signed)
Subjective: Patient awake talking this morning. Complaining of right sided headache. Oriented to self. Patient reported that she slept yesterday because she did not sleep properly 2 days ago.  Objective: Vital signs in last 24 hours: Filed Vitals:   04/27/12 1807 04/27/12 2000 04/28/12 0100 04/28/12 0541  BP: 163/54 196/78 197/72 179/85  Pulse: 64 69 76 87  Temp:  97.3 F (36.3 C) 97.6 F (36.4 C) 98.3 F (36.8 C)  TempSrc:  Oral Oral Oral  Resp: 16 16 16 16   Weight:  65.3 kg (143 lb 15.4 oz)    SpO2: 99% 97% 98% 98%   Weight change:   Intake/Output Summary (Last 24 hours) at 04/28/12 0928 Last data filed at 04/28/12 0546  Gross per 24 hour  Intake      0 ml  Output   1100 ml  Net  -1100 ml    Physical Exam: General: Awake, Oriented to self, No acute distress. HEENT: EOMI. Neck: Supple CV: S1 and S2 Lungs: Clear to ascultation bilaterally Abdomen: Soft, Nontender, Nondistended, +bowel sounds. Ext: Good pulses. Trace edema.  Lab Results: Basic Metabolic Panel:  Lab 04/28/12 0454 04/27/12 1531 04/25/12 2316  NA 137 138 137  K 3.9 3.7 3.6  CL 102 100 102  CO2 24 27 26   GLUCOSE 121* 115* 107*  BUN 12 14 13   CREATININE 0.56 0.56 0.64  CALCIUM 9.3 9.8 9.7  MG -- -- --  PHOS -- -- --   Liver Function Tests:  Lab 04/27/12 1531 04/25/12 2316  AST 13 12  ALT <5 <5  ALKPHOS 53 51  BILITOT 0.8 0.4  PROT 7.0 6.5  ALBUMIN 3.2* 3.0*   No results found for this basename: LIPASE:5,AMYLASE:5 in the last 168 hours No results found for this basename: AMMONIA:5 in the last 168 hours CBC:  Lab 04/28/12 0548 04/27/12 1531 04/25/12 2316  WBC 9.0 10.1 9.8  NEUTROABS -- 6.5 5.1  HGB 11.3* 12.7 10.8*  HCT 34.0* 38.4 33.5*  MCV 85.6 85.3 85.9  PLT 131* 159 161   Cardiac Enzymes:  Lab 04/25/12 2316  CKTOTAL 34  CKMB --  CKMBINDEX --  TROPONINI <0.30   BNP (last 3 results) No results found for this basename: PROBNP:3 in the last 8760 hours CBG:  Lab 04/28/12  0637 04/27/12 2143 04/25/12 2234  GLUCAP 115* 108* 91   No results found for this basename: HGBA1C:5 in the last 72 hours Other Labs: No components found with this basename: POCBNP:3 No results found for this basename: DDIMER:2 in the last 168 hours No results found for this basename: CHOL:2,HDL:2,LDLCALC:2,TRIG:2,CHOLHDL:2,LDLDIRECT:2 in the last 168 hours No results found for this basename: TSH,T4TOTAL,FREET3,T3FREE,FREET4,THYROIDAB in the last 168 hours No results found for this basename: VITAMINB12:2,FOLATE:2,FERRITIN:2,TIBC:2,IRON:2,RETICCTPCT:2 in the last 168 hours  Micro Results: No results found for this or any previous visit (from the past 240 hour(s)).  Studies/Results: Mr Sherrin Daisy Contrast  04/28/2012  *RADIOLOGY REPORT*  Clinical Data: Generalized weakness.  Altered mental status.  MRI HEAD WITHOUT CONTRAST  Technique:  Multiplanar, multiecho pulse sequences of the brain and surrounding structures were obtained according to standard protocol without intravenous contrast.  Comparison: MRI 01/15/2010  Findings: Negative for acute infarct.  Age appropriate atrophy.  Chronic ischemic changes are present in the white matter bilaterally, similar to the prior study.  Chronic ischemic changes are present in the thalami and pons bilaterally. Small chronic infarcts in the basal ganglia bilaterally.  These findings are similar to the prior MRI.  Negative  for mass or edema.  Negative for hemorrhage.  Vessels at the base of the brain appear patent.  Paranasal sinuses are clear.  IMPRESSION: Atrophy and chronic microvascular ischemic changes, similar to 2011.  No acute infarct.  Original Report Authenticated By: Camelia Phenes, M.D.   Dg Abd 2 Views  04/27/2012  *RADIOLOGY REPORT*  Clinical Data: Peritonitis  ABDOMEN - 2 VIEW  Comparison: 03/31/2012  Findings: No evidence of free intraperitoneal gas on the left decubitus abdominal film.  No air fluid levels.  No disproportionate dilatation of  bowel.  External objects project over the left upper quadrant of the abdomen.  Prominent stool throughout the colon.  Osteopenia.  Severe degenerative changes of the lower lumbar spine and right hip joint.  IMPRESSION: Nonobstructive bowel gas pattern.  No free intraperitoneal gas.  Original Report Authenticated By: Donavan Burnet, M.D.    Medications: I have reviewed the patient's current medications. Scheduled Meds:   . sodium chloride   Intravenous Once  . amLODipine  2.5 mg Oral Daily  . bisoprolol-hydrochlorothiazide  1 tablet Oral Daily  . enoxaparin  40 mg Subcutaneous Q24H  . insulin aspart  0-9 Units Subcutaneous TID WC  . levothyroxine  50 mcg Oral Daily  . lisinopril  5 mg Oral Daily  . pantoprazole  40 mg Oral Daily   Continuous Infusions:   . sodium chloride     PRN Meds:.acetaminophen, acetaminophen, hydrALAZINE, hyoscyamine, ondansetron (ZOFRAN) IV, ondansetron  Assessment/Plan: Acute delirium/Altered mental status/Encephalopathy  Etiology unclear. Head CT and MRI of the brain done no acute infarct. Continue to hold Seroquel as this medication can be sedating. Infectious workup negative with negative urinalysis and chest x-ray. Per discussion with patient's daughters, has been declining mentally over the last few years, uncertain if patient has underlying dementia which has progressed.   Type 2 diabetes with complications  Has been taken off of diabetic medications recently. Sensitive SSI. Diet controlled.  Recent peritonitis  Abdominal x-ray shows nonobstructive bowel gas patter and no free intraperitoneal gas. Not an active issue at this time.   Hypertension  Stable. Continue home medications but at low doses.   Arthritis  Stable   Hypothyroidism  Continue synthroid.   Prophylaxis  Lovenox.   CODE STATUS  Full code.  Disposition DC home with home health versus SNF depending on what family decides.   LOS: 1 day  Tamika Shropshire A, MD 04/28/2012, 9:28  AM

## 2012-04-29 DIAGNOSIS — E032 Hypothyroidism due to medicaments and other exogenous substances: Secondary | ICD-10-CM

## 2012-04-29 DIAGNOSIS — E1165 Type 2 diabetes mellitus with hyperglycemia: Secondary | ICD-10-CM

## 2012-04-29 DIAGNOSIS — F05 Delirium due to known physiological condition: Secondary | ICD-10-CM

## 2012-04-29 DIAGNOSIS — I10 Essential (primary) hypertension: Secondary | ICD-10-CM

## 2012-04-29 LAB — GLUCOSE, CAPILLARY
Glucose-Capillary: 132 mg/dL — ABNORMAL HIGH (ref 70–99)
Glucose-Capillary: 188 mg/dL — ABNORMAL HIGH (ref 70–99)

## 2012-04-29 MED ORDER — PNEUMOCOCCAL VAC POLYVALENT 25 MCG/0.5ML IJ INJ
0.5000 mL | INJECTION | INTRAMUSCULAR | Status: AC
  Administered 2012-04-30: 0.5 mL via INTRAMUSCULAR
  Filled 2012-04-29: qty 0.5

## 2012-04-29 NOTE — Discharge Summary (Addendum)
Discharge Summary  Megan [name] MR#: [id number]  DOB:[birth year]  Date of Admission: 04/27/2012 Date of Discharge: 05/02/2012  Patient's PCP: [name], MD  Attending Physician:Malvin Morrish A  Consults: None  Discharge Diagnoses: Principal Problem:  *Acute delirium Active Problems:  Peritonitis  DM (diabetes mellitus), type 2 with complications  Hypertension  Arthritis  Hypothyroidism   Brief Admitting History and Physical Megan [name] is a 78 y.o. African American female with recent history of peritonitis status post laparoscopic surgery to repair perforated ulcer on 03/25/2012, hypertension, type 2 diabetes, hiatal hernia, and arthritis who presented on 04/27/2012 with altered mental status.  Discharge Medications Medication List  As of 05/02/2012 10:38 AM   STOP taking these medications         QUEtiapine 50 MG Tb24         TAKE these medications         amLODipine 2.5 MG tablet   Commonly known as: NORVASC   Take 1 tablet (2.5 mg total) by mouth daily.      bisoprolol-hydrochlorothiazide 5-6.25 MG per tablet   Commonly known as: ZIAC   Take 1 tablet by mouth daily.      hyoscyamine 0.125 MG tablet   Commonly known as: LEVSIN, ANASPAZ   Take 0.125 mg by mouth every 4 (four) hours as needed. For cramping/bloating/diarrhea      ibuprofen 400 MG tablet   Commonly known as: ADVIL,MOTRIN   Take 1 tablet (400 mg total) by mouth every 6 (six) hours as needed.      levothyroxine 50 MCG tablet   Commonly known as: SYNTHROID, LEVOTHROID   Take 50 mcg by mouth daily.      lisinopril 5 MG tablet   Commonly known as: PRINIVIL,ZESTRIL   Take 1 tablet (5 mg total) by mouth daily.      pantoprazole 40 MG tablet   Commonly known as: PROTONIX   Take 40 mg by mouth daily.      polyvinyl alcohol 1.4 % ophthalmic solution   Commonly known as: LIQUIFILM TEARS   Place 1 drop into both eyes as needed.      QUEtiapine 12.5 mg Tabs   Commonly known as:  SEROQUEL   Take 0.5 tablets (12.5 mg total) by mouth at bedtime.            Hospital Course: Acute delirium/Altered mental status/Encephalopathy  Improved, "sundowning during the night." Head CT and MRI of the brain done no acute infarct.  Improved with Seroquel at night for sleep. Infectious workup negative with negative urinalysis and chest x-ray. Per discussion with patient's daughters, has been declining mentally over the last few years, uncertain if patient has underlying dementia which has progressed.   Type 2 diabetes with complications  Stable. Has been taken off of diabetic medications recently. Sensitive SSI. Diet controlled.  Recent peritonitis  Stable. Not an active issue at this time. Abdominal x-ray shows nonobstructive bowel gas patter and no free intraperitoneal gas.   Hypertension  Stable. Required holding lisinopril and amlodipine on 04/30/2012.  Continue decreased dose of lisinopril to 5 mg daily and amlodipine to 2.5 mg daily. Continue bisoprolol-HCTZ.  Further titration as outpatient.  Arthritis  Stable   Hypothyroidism  Continue synthroid.   Day of Discharge BP 135/82  Pulse 96  Temp 98.4 F (36.9 C) (Oral)  Resp 18  Ht 5\' 8"  (1.727 m)  Wt 64.91 kg (143 lb 1.6 oz)  BMI 21.76 kg/m2  SpO2 99%  Results for orders placed  during the hospital encounter of 04/27/12 (from the past 48 hour(s))  GLUCOSE, CAPILLARY     Status: Abnormal   Collection Time   04/30/12 11:14 AM      Component Value Range Comment   Glucose-Capillary 233 (*) 70 - 99 mg/dL   GLUCOSE, CAPILLARY     Status: Abnormal   Collection Time   04/30/12  4:43 PM      Component Value Range Comment   Glucose-Capillary 159 (*) 70 - 99 mg/dL   GLUCOSE, CAPILLARY     Status: Abnormal   Collection Time   04/30/12 10:27 PM      Component Value Range Comment   Glucose-Capillary 138 (*) 70 - 99 mg/dL   GLUCOSE, CAPILLARY     Status: Abnormal   Collection Time   05/01/12  7:04 AM      Component  Value Range Comment   Glucose-Capillary 137 (*) 70 - 99 mg/dL   GLUCOSE, CAPILLARY     Status: Abnormal   Collection Time   05/01/12 12:00 PM      Component Value Range Comment   Glucose-Capillary 186 (*) 70 - 99 mg/dL   GLUCOSE, CAPILLARY     Status: Abnormal   Collection Time   05/01/12  4:20 PM      Component Value Range Comment   Glucose-Capillary 142 (*) 70 - 99 mg/dL   GLUCOSE, CAPILLARY     Status: Abnormal   Collection Time   05/01/12 10:14 PM      Component Value Range Comment   Glucose-Capillary 155 (*) 70 - 99 mg/dL   GLUCOSE, CAPILLARY     Status: Abnormal   Collection Time   05/02/12  6:42 AM      Component Value Range Comment   Glucose-Capillary 135 (*) 70 - 99 mg/dL     Dg Chest 2 View  1/61/0960  *RADIOLOGY REPORT*  Clinical Data: 78 year old female with confusion, altered level of consciousness.  CHEST - 2 VIEW  Comparison: 04/01/2012 and earlier.  Findings: Semi upright AP and lateral views of the chest.  Interval decreased but not completely resolved small pleural effusions. Interval improved bibasilar ventilation.  No areas of worsening ventilation.  No pneumothorax or edema. No acute osseous abnormality identified.  IMPRESSION: Decreased bilateral pleural effusions, small residuals.  Improved basilar ventilation with no new cardiopulmonary abnormality.  Original Report Authenticated By: Harley Hallmark, M.D.   Dg Chest 2 View  04/01/2012  *RADIOLOGY REPORT*  Clinical Data: 78 year old female with low grade fever.  Abdominal pain, leukocytosis, hypertension diabetes.  CHEST - 2 VIEW  Comparison: 03/27/2012 and earlier.  Findings: Enteric tube has been removed. Right lung base chest tube has been removed.  Right IJ central line has been removed.  No pneumothorax.  Small bilateral pleural effusions.  Decreased right perihilar airspace disease with mild residual which most resembles atelectasis.   No pulmonary edema. Stable cardiomegaly and mediastinal contours.  No acute  osseous abnormality identified.  IMPRESSION: 1.  Lines and tubes removed.  No pneumothorax. 2.  Bilateral pleural effusions.  Decreased right perihilar opacity, probable atelectasis.  Original Report Authenticated By: Harley Hallmark, M.D.   Dg Abd 1 View  03/31/2012  *RADIOLOGY REPORT*  Clinical Data: Abdominal pain and worsening leukocytosis.  Recent pneumoperitoneum and peritonitis.  ABDOMEN - 1 VIEW  Comparison: CT dated 02/24/2012.  Findings: Normal bowel gas pattern.  Curvilinear calcifications in a left renal cyst seen on the previous CT.  Sclerosis on both sides of  the right sacroiliac joint.  Lumbar spine degenerative changes and right hip degenerative changes.  No gross pneumoperitoneum.  IMPRESSION:  1.  No acute abnormality. 2.  Previously noted left renal Bosniak category II F lesion. 3.  Lumbar spine degenerative changes, right sacroiliitis and right hip degenerative changes.  Original Report Authenticated By: Darrol Angel, M.D.   Ct Head Wo Contrast  04/25/2012  *RADIOLOGY REPORT*  Clinical Data: 78 year old female with altered mental status confusion and pain.  CT HEAD WITHOUT CONTRAST  Technique:  Contiguous axial images were obtained from the base of the skull through the vertex without contrast.  Comparison: 03/24/2012 and earlier.  Findings: Visualized paranasal sinuses and mastoids are clear. Hyperostosis frontalis. No acute osseous abnormality identified. Visualized orbits and scalp soft tissues are within normal limits.  Calcified atherosclerosis at the skull base.  Stable cerebral volume.  No ventriculomegaly.  Confluent cerebral white matter hypodensity is stable. No midline shift, mass effect, or evidence of mass lesion.  No acute intracranial hemorrhage identified.  No suspicious intracranial vascular hyperdensity. No evidence of cortically based acute infarction identified.  Stable dural calcifications.  IMPRESSION: Stable chronic white matter disease. No acute intracranial  abnormality.  Original Report Authenticated By: Harley Hallmark, M.D.   Mr Brain Wo Contrast  04/28/2012  *RADIOLOGY REPORT*  Clinical Data: Generalized weakness.  Altered mental status.  MRI HEAD WITHOUT CONTRAST  Technique:  Multiplanar, multiecho pulse sequences of the brain and surrounding structures were obtained according to standard protocol without intravenous contrast.  Comparison: MRI 01/15/2010  Findings: Negative for acute infarct.  Age appropriate atrophy.  Chronic ischemic changes are present in the white matter bilaterally, similar to the prior study.  Chronic ischemic changes are present in the thalami and pons bilaterally. Small chronic infarcts in the basal ganglia bilaterally.  These findings are similar to the prior MRI.  Negative for mass or edema.  Negative for hemorrhage.  Vessels at the base of the brain appear patent.  Paranasal sinuses are clear.  IMPRESSION: Atrophy and chronic microvascular ischemic changes, similar to 2011.  No acute infarct.  Original Report Authenticated By: Camelia Phenes, M.D.   Dg Abd 2 Views  04/27/2012  *RADIOLOGY REPORT*  Clinical Data: Peritonitis  ABDOMEN - 2 VIEW  Comparison: 03/31/2012  Findings: No evidence of free intraperitoneal gas on the left decubitus abdominal film.  No air fluid levels.  No disproportionate dilatation of bowel.  External objects project over the left upper quadrant of the abdomen.  Prominent stool throughout the colon.  Osteopenia.  Severe degenerative changes of the lower lumbar spine and right hip joint.  IMPRESSION: Nonobstructive bowel gas pattern.  No free intraperitoneal gas.  Original Report Authenticated By: Donavan Burnet, M.D.     Disposition: SNF  Diet: Dysphagia 3 diet with thin liquids.  Activity: Resume as tolerated.   Follow-up Appts: Discharge Orders    Future Orders Please Complete By Expires   Increase activity slowly      Discharge instructions      Comments:   Followup with [name], MD (PCP) in 1 week.  Diet: Dysphagia 3 diet with thin liquids.      TESTS THAT NEED FOLLOW-UP None  Time spent on discharge, talking to the patient, and coordinating care: 25 mins.   Signed: Cristal Ford, MD 05/02/2012, 10:38 AM

## 2012-04-29 NOTE — Progress Notes (Signed)
Subjective: Awake. No specific complaints.   Objective: Vital signs in last 24 hours: Filed Vitals:   04/28/12 2228 04/29/12 0230 04/29/12 0700 04/29/12 0958  BP: 136/70 194/84  169/84  Pulse: 74 75  79  Temp: 98.2 F (36.8 C) 97.9 F (36.6 C)  97.8 F (36.6 C)  TempSrc: Oral Oral  Oral  Resp: 18 18  18   Height:   5\' 8"  (1.727 m)   Weight:   64.955 kg (143 lb 3.2 oz)   SpO2: 98% 99%  99%   Weight change: -0.345 kg (-12.2 oz) No intake or output data in the 24 hours ending 04/29/12 1032  Physical Exam: General: Awake, No acute distress. HEENT: EOMI. Neck: Supple CV: S1 and S2 Lungs: Clear to ascultation bilaterally Abdomen: Soft, Nontender, Nondistended, +bowel sounds. Ext: Good pulses. Trace edema.  Lab Results: Basic Metabolic Panel:  Lab 04/28/12 1610 04/27/12 1531 04/25/12 2316  NA 137 138 137  K 3.9 3.7 3.6  CL 102 100 102  CO2 24 27 26   GLUCOSE 121* 115* 107*  BUN 12 14 13   CREATININE 0.56 0.56 0.64  CALCIUM 9.3 9.8 9.7  MG -- -- --  PHOS -- -- --   Liver Function Tests:  Lab 04/27/12 1531 04/25/12 2316  AST 13 12  ALT <5 <5  ALKPHOS 53 51  BILITOT 0.8 0.4  PROT 7.0 6.5  ALBUMIN 3.2* 3.0*   No results found for this basename: LIPASE:5,AMYLASE:5 in the last 168 hours  Lab 04/28/12 0917  AMMONIA 14   CBC:  Lab 04/28/12 0548 04/27/12 1531 04/25/12 2316  WBC 9.0 10.1 9.8  NEUTROABS -- 6.5 5.1  HGB 11.3* 12.7 10.8*  HCT 34.0* 38.4 33.5*  MCV 85.6 85.3 85.9  PLT 131* 159 161   Cardiac Enzymes:  Lab 04/25/12 2316  CKTOTAL 34  CKMB --  CKMBINDEX --  TROPONINI <0.30   BNP (last 3 results) No results found for this basename: PROBNP:3 in the last 8760 hours CBG:  Lab 04/29/12 0743 04/28/12 2224 04/28/12 1650 04/28/12 1146 04/28/12 0637  GLUCAP 132* 114* 228* 137* 115*   No results found for this basename: HGBA1C:5 in the last 72 hours Other Labs: No components found with this basename: POCBNP:3 No results found for this basename:  DDIMER:2 in the last 168 hours No results found for this basename: CHOL:2,HDL:2,LDLCALC:2,TRIG:2,CHOLHDL:2,LDLDIRECT:2 in the last 168 hours No results found for this basename: TSH,T4TOTAL,FREET3,T3FREE,FREET4,THYROIDAB in the last 168 hours  Lab 04/28/12 0925  VITAMINB12 451  FOLATE 11.6  FERRITIN --  TIBC --  IRON --  RETICCTPCT --    Micro Results: No results found for this or any previous visit (from the past 240 hour(s)).  Studies/Results: Mr Sherrin Daisy Contrast  04/28/2012  *RADIOLOGY REPORT*  Clinical Data: Generalized weakness.  Altered mental status.  MRI HEAD WITHOUT CONTRAST  Technique:  Multiplanar, multiecho pulse sequences of the brain and surrounding structures were obtained according to standard protocol without intravenous contrast.  Comparison: MRI 01/15/2010  Findings: Negative for acute infarct.  Age appropriate atrophy.  Chronic ischemic changes are present in the white matter bilaterally, similar to the prior study.  Chronic ischemic changes are present in the thalami and pons bilaterally. Small chronic infarcts in the basal ganglia bilaterally.  These findings are similar to the prior MRI.  Negative for mass or edema.  Negative for hemorrhage.  Vessels at the base of the brain appear patent.  Paranasal sinuses are clear.  IMPRESSION: Atrophy and chronic microvascular  ischemic changes, similar to 2011.  No acute infarct.  Original Report Authenticated By: Camelia Phenes, M.D.   Dg Abd 2 Views  04/27/2012  *RADIOLOGY REPORT*  Clinical Data: Peritonitis  ABDOMEN - 2 VIEW  Comparison: 03/31/2012  Findings: No evidence of free intraperitoneal gas on the left decubitus abdominal film.  No air fluid levels.  No disproportionate dilatation of bowel.  External objects project over the left upper quadrant of the abdomen.  Prominent stool throughout the colon.  Osteopenia.  Severe degenerative changes of the lower lumbar spine and right hip joint.  IMPRESSION: Nonobstructive bowel gas  pattern.  No free intraperitoneal gas.  Original Report Authenticated By: Donavan Burnet, M.D.    Medications: I have reviewed the patient's current medications. Scheduled Meds:    . amLODipine  5 mg Oral Daily  . bisoprolol-hydrochlorothiazide  1 tablet Oral Daily  . enoxaparin  40 mg Subcutaneous Q24H  . insulin aspart  0-9 Units Subcutaneous TID WC  . levothyroxine  50 mcg Oral Daily  . lisinopril  20 mg Oral Daily  . pantoprazole  40 mg Oral Daily  . pneumococcal 23 valent vaccine  0.5 mL Intramuscular Tomorrow-1000   Continuous Infusions:  PRN Meds:.acetaminophen, acetaminophen, hydrALAZINE, hyoscyamine, ondansetron (ZOFRAN) IV, ondansetron  Assessment/Plan: Acute delirium/Altered mental status/Encephalopathy  Improved and stable at this time. Head CT and MRI of the brain done no acute infarct. Continue to hold Seroquel. Infectious workup negative with negative urinalysis and chest x-ray. Per discussion with patient's daughters, has been declining mentally over the last few years, uncertain if patient has underlying dementia which has progressed.   Type 2 diabetes with complications  Has been taken off of diabetic medications recently. Sensitive SSI. Diet controlled.  Recent peritonitis  Abdominal x-ray shows nonobstructive bowel gas patter and no free intraperitoneal gas. Not an active issue at this time.   Hypertension  Stable. Continue home medications but at low doses.   Arthritis  Stable   Hypothyroidism  Continue synthroid.   Prophylaxis  Lovenox.   CODE STATUS  Full code.  Disposition DC to SNF once bed is available.    LOS: 2 days  Latima Hamza A, MD 04/29/2012, 10:32 AM

## 2012-04-29 NOTE — Clinical Social Work Placement (Addendum)
    Clinical Social Work Department CLINICAL SOCIAL WORK PLACEMENT NOTE 04/29/2012  Patient:  Megan [name], Megan [name]  Account Number:  [id number] Admit date:  04/27/2012  Clinical Social Worker:  Peggyann Shoals  Date/time:  04/29/2012 05:32 PM  Clinical Social Work is seeking post-discharge placement for this patient at the following level of care:   SKILLED NURSING   (*CSW will update this form in Epic as items are completed)   04/28/2012  Patient/family provided with [facility] Department of Clinical Social Work's list of facilities offering this level of care within the geographic area requested by the patient (or if unable, by the patient's family).  04/28/2012  Patient/family informed of their freedom to choose among providers that offer the needed level of care, that participate in Medicare, Medicaid or managed care program needed by the patient, have an available bed and are willing to accept the patient.  04/28/2012  Patient/family informed of [facility]' ownership interest in [facility], as well as of the fact that they are under no obligation to receive care at this facility.  PASARR submitted to EDS on 04/29/2012 PASARR number received from EDS on 04/29/2012  FL2 transmitted to all facilities in geographic area requested by pt/family on  04/29/2012 FL2 transmitted to all facilities within larger geographic area on   Patient informed that his/her managed care company has contracts with or will negotiate with  certain facilities, including the following:     Patient/family informed of bed offers received:  04/29/2012 Patient chooses bed at [facility] Physician recommends and patient chooses bed at  SNF  Patient to be transferred to [facility] on 05/02/2012 Patient to be transferred to facility by [facility]  The following physician request were entered in Epic:   Additional Comments:

## 2012-04-29 NOTE — Progress Notes (Signed)
Speech Language Pathology Dysphagia Treatment Patient Details Name: Megan [name] MRN: [id number] DOB: [birth year] Today's Date: 04/29/2012 Time: 4098-1191 SLP Time Calculation (min): 17 min  Assessment / Plan / Recommendation Clinical Impression  Pt. seen with breakfast for dysphagia treatment.  On arrival pt. appeared to have mucous in oral cavity and expectorated copious amount of thick whitish phelgm.  Pt. required mod verbal/tactile and visual cues to slow down, one bite at a time and check left side oral cavity for pocketed food ( moderate pocketing).  No s/s aspiration exhibited.  Educated tech to make sure pt.'s oral cavity is clear at end of meals.  Pt. would benefit from one more session to ensure Dys 3 diet is appropirate and efficient.    Diet Recommendation  Continue with Current Diet: Dysphagia 3 (mechanical soft);Thin liquid    SLP Plan Continue with current plan of care      Swallowing Goals  SLP Swallowing Goals Patient will consume recommended diet without observed clinical signs of aspiration with: Minimal assistance Swallow Study Goal #1 - Progress: Progressing toward goal Patient will utilize recommended strategies during swallow to increase swallowing safety with: Minimal assistance Swallow Study Goal #2 - Progress: Progressing toward goal  General Temperature Spikes Noted: No Respiratory Status: Room air Behavior/Cognition: Alert;Cooperative;Pleasant mood Patient Positioning: Upright in chair  Oral Cavity - Oral Hygiene Does patient have any of the following "at risk" factors?: Other - dysphagia Brush patient's teeth BID with toothbrush (using toothpaste with fluoride): Yes   Dysphagia Treatment Treatment focused on: Skilled observation of diet tolerance;Patient/family/caregiver education;Facilitation of oral preparatory phase;Facilitation of oral phase Treatment Methods/Modalities: Skilled observation Patient observed directly with PO's: Yes Type of PO's  observed: Dysphagia 3 (soft);Thin liquids Feeding: Able to feed self Liquids provided via: Straw Oral Phase Signs & Symptoms: Left pocketing;Prolonged oral phase Type of cueing: Verbal;Visual;Tactile Amount of cueing: Moderate   Breck Coons Dike.Ed [employer] [phone]  04/29/2012

## 2012-04-29 NOTE — Clinical Social Work Note (Signed)
CSW provided bed offers to pt and pt's daughter. CSW contacted pt's insurance company to inquire about SNF coverage from home. Insurance company confirmed that SNF will be covered either way, from home or hospital. Pt's daughter would like [facility], however they have not responded to referral. CSW will follow up on Monday. CSW made RNCM aware of possible discharge with home health over the weekend. CSW will cotninue to follow.   Dede Query, MSW, [place] [facility] [phone]

## 2012-04-30 DIAGNOSIS — I10 Essential (primary) hypertension: Secondary | ICD-10-CM

## 2012-04-30 DIAGNOSIS — IMO0001 Reserved for inherently not codable concepts without codable children: Secondary | ICD-10-CM

## 2012-04-30 DIAGNOSIS — E032 Hypothyroidism due to medicaments and other exogenous substances: Secondary | ICD-10-CM

## 2012-04-30 DIAGNOSIS — E1165 Type 2 diabetes mellitus with hyperglycemia: Secondary | ICD-10-CM

## 2012-04-30 DIAGNOSIS — F05 Delirium due to known physiological condition: Secondary | ICD-10-CM

## 2012-04-30 LAB — GLUCOSE, CAPILLARY
Glucose-Capillary: 130 mg/dL — ABNORMAL HIGH (ref 70–99)
Glucose-Capillary: 233 mg/dL — ABNORMAL HIGH (ref 70–99)

## 2012-04-30 NOTE — Progress Notes (Signed)
Subjective: Awake. No specific complaints. Not feeling hungry this morning.  Objective: Vital signs in last 24 hours: Filed Vitals:   04/29/12 1411 04/29/12 1801 04/29/12 2207 04/30/12 0700  BP: 166/83 123/73 148/63 142/64  Pulse: 65 71 68 65  Temp: 98.7 F (37.1 C) 99.4 F (37.4 C) 97.3 F (36.3 C) 97 F (36.1 C)  TempSrc: Oral Oral Oral Oral  Resp: 18 18 18 16   Height:      Weight:    65.998 kg (145 lb 8 oz)  SpO2: 99% 97% 97% 96%   Weight change: 1.043 kg (2 lb 4.8 oz) No intake or output data in the 24 hours ending 04/30/12 0910  Physical Exam: General: Awake, No acute distress. HEENT: EOMI. Neck: Supple CV: S1 and S2 Lungs: Clear to ascultation bilaterally Abdomen: Soft, Nontender, Nondistended, +bowel sounds. Ext: Good pulses. Trace edema.  Lab Results: Basic Metabolic Panel:  Lab 04/28/12 1610 04/27/12 1531 04/25/12 2316  NA 137 138 137  K 3.9 3.7 3.6  CL 102 100 102  CO2 24 27 26   GLUCOSE 121* 115* 107*  BUN 12 14 13   CREATININE 0.56 0.56 0.64  CALCIUM 9.3 9.8 9.7  MG -- -- --  PHOS -- -- --   Liver Function Tests:  Lab 04/27/12 1531 04/25/12 2316  AST 13 12  ALT <5 <5  ALKPHOS 53 51  BILITOT 0.8 0.4  PROT 7.0 6.5  ALBUMIN 3.2* 3.0*   No results found for this basename: LIPASE:5,AMYLASE:5 in the last 168 hours  Lab 04/28/12 0917  AMMONIA 14   CBC:  Lab 04/28/12 0548 04/27/12 1531 04/25/12 2316  WBC 9.0 10.1 9.8  NEUTROABS -- 6.5 5.1  HGB 11.3* 12.7 10.8*  HCT 34.0* 38.4 33.5*  MCV 85.6 85.3 85.9  PLT 131* 159 161   Cardiac Enzymes:  Lab 04/25/12 2316  CKTOTAL 34  CKMB --  CKMBINDEX --  TROPONINI <0.30   BNP (last 3 results) No results found for this basename: PROBNP:3 in the last 8760 hours CBG:  Lab 04/29/12 2136 04/29/12 1653 04/29/12 1147 04/29/12 0743 04/28/12 2224  GLUCAP 171* 155* 188* 132* 114*   No results found for this basename: HGBA1C:5 in the last 72 hours Other Labs: No components found with this basename:  POCBNP:3 No results found for this basename: DDIMER:2 in the last 168 hours No results found for this basename: CHOL:2,HDL:2,LDLCALC:2,TRIG:2,CHOLHDL:2,LDLDIRECT:2 in the last 168 hours No results found for this basename: TSH,T4TOTAL,FREET3,T3FREE,FREET4,THYROIDAB in the last 168 hours  Lab 04/28/12 0925  VITAMINB12 451  FOLATE 11.6  FERRITIN --  TIBC --  IRON --  RETICCTPCT --    Micro Results: No results found for this or any previous visit (from the past 240 hour(s)).  Studies/Results: No results found.  Medications: I have reviewed the patient's current medications. Scheduled Meds:    . amLODipine  5 mg Oral Daily  . bisoprolol-hydrochlorothiazide  1 tablet Oral Daily  . enoxaparin  40 mg Subcutaneous Q24H  . insulin aspart  0-9 Units Subcutaneous TID WC  . levothyroxine  50 mcg Oral Daily  . lisinopril  20 mg Oral Daily  . pantoprazole  40 mg Oral Daily  . pneumococcal 23 valent vaccine  0.5 mL Intramuscular Tomorrow-1000   Continuous Infusions:  PRN Meds:.acetaminophen, acetaminophen, hydrALAZINE, hyoscyamine, ondansetron (ZOFRAN) IV, ondansetron  Assessment/Plan: Acute delirium/Altered mental status/Encephalopathy  Improved and stable at this time. Head CT and MRI of the brain done no acute infarct. Continue to hold Seroquel. Infectious workup  negative with negative urinalysis and chest x-ray. Per discussion with patient's daughters, has been declining mentally over the last few years, uncertain if patient has underlying dementia which has progressed.   Type 2 diabetes with complications  Stable. Has been taken off of diabetic medications recently. Sensitive SSI. Diet controlled.  Recent peritonitis  Stable. Not an active issue at this time. Abdominal x-ray shows nonobstructive bowel gas patter and no free intraperitoneal gas.   Hypertension  Stable. Continue home medications at home doses.   Arthritis  Stable   Hypothyroidism  Continue synthroid.    Prophylaxis  Lovenox.   CODE STATUS  Full code.  Disposition DC to SNF once bed is available. Patient stable for discharge.   LOS: 3 days  Aylin Rhoads A, MD 04/30/2012, 9:10 AM

## 2012-04-30 NOTE — Progress Notes (Signed)
Per Environmental education officer at [facility], no bed available for pt today. Per weekday CSW note, pt would have SNF coverage if d/c home and pt/pt's family would facilitate via [facility] agency. Will f/u with [facility] tomorrow on bed status. CSW signing off if pt d/c prior to Sunday.  Dellie Burns, MSW, [place] [phone] (weekend)

## 2012-05-01 DIAGNOSIS — F05 Delirium due to known physiological condition: Secondary | ICD-10-CM

## 2012-05-01 DIAGNOSIS — I10 Essential (primary) hypertension: Secondary | ICD-10-CM

## 2012-05-01 DIAGNOSIS — E1165 Type 2 diabetes mellitus with hyperglycemia: Secondary | ICD-10-CM

## 2012-05-01 DIAGNOSIS — E032 Hypothyroidism due to medicaments and other exogenous substances: Secondary | ICD-10-CM

## 2012-05-01 LAB — GLUCOSE, CAPILLARY
Glucose-Capillary: 137 mg/dL — ABNORMAL HIGH (ref 70–99)
Glucose-Capillary: 155 mg/dL — ABNORMAL HIGH (ref 70–99)

## 2012-05-01 MED ORDER — LISINOPRIL 5 MG PO TABS
5.0000 mg | ORAL_TABLET | Freq: Every day | ORAL | Status: DC
  Administered 2012-05-01 – 2012-05-02 (×2): 5 mg via ORAL
  Filled 2012-05-01 (×2): qty 1

## 2012-05-01 MED ORDER — QUETIAPINE 12.5 MG HALF TABLET
12.5000 mg | ORAL_TABLET | Freq: Every day | ORAL | Status: DC
  Administered 2012-05-01: 12.5 mg via ORAL
  Filled 2012-05-01 (×2): qty 1

## 2012-05-01 MED ORDER — POLYVINYL ALCOHOL 1.4 % OP SOLN
1.0000 [drp] | OPHTHALMIC | Status: DC | PRN
  Administered 2012-05-01: 1 [drp] via OPHTHALMIC
  Filled 2012-05-01 (×2): qty 15

## 2012-05-01 MED ORDER — AMLODIPINE BESYLATE 2.5 MG PO TABS
2.5000 mg | ORAL_TABLET | Freq: Every day | ORAL | Status: DC
  Administered 2012-05-01 – 2012-05-02 (×2): 2.5 mg via ORAL
  Filled 2012-05-01 (×2): qty 1

## 2012-05-01 MED ORDER — IBUPROFEN 400 MG PO TABS
400.0000 mg | ORAL_TABLET | Freq: Four times a day (QID) | ORAL | Status: DC | PRN
  Administered 2012-05-01: 400 mg via ORAL
  Filled 2012-05-01 (×2): qty 1

## 2012-05-01 NOTE — Progress Notes (Signed)
Subjective: Per nursing had trouble sleeping last night, became confused and agitated, required a sitter. This morning sleeping but easily arousable, oriented to self, birth month and year.  No specific complaints.  Objective: Vital signs in last 24 hours: Filed Vitals:   04/30/12 1239 04/30/12 1441 04/30/12 2222 05/01/12 0900  BP: 105/61 130/73 128/65 133/74  Pulse: 74 67 70 89  Temp:  98.1 F (36.7 C) 98.9 F (37.2 C)   TempSrc:  Oral Oral   Resp:  18 18 18   Height:      Weight:      SpO2:  97% 98%    Weight change:  No intake or output data in the 24 hours ending 05/01/12 0915  Physical Exam: General: Awake, No acute distress. HEENT: EOMI. Neck: Supple CV: S1 and S2 Lungs: Clear to ascultation bilaterally Abdomen: Soft, Nontender, Nondistended, +bowel sounds. Ext: Good pulses. Trace edema.  Lab Results: Basic Metabolic Panel:  Lab 04/28/12 9604 04/27/12 1531 04/25/12 2316  NA 137 138 137  K 3.9 3.7 3.6  CL 102 100 102  CO2 24 27 26   GLUCOSE 121* 115* 107*  BUN 12 14 13   CREATININE 0.56 0.56 0.64  CALCIUM 9.3 9.8 9.7  MG -- -- --  PHOS -- -- --   Liver Function Tests:  Lab 04/27/12 1531 04/25/12 2316  AST 13 12  ALT <5 <5  ALKPHOS 53 51  BILITOT 0.8 0.4  PROT 7.0 6.5  ALBUMIN 3.2* 3.0*   No results found for this basename: LIPASE:5,AMYLASE:5 in the last 168 hours  Lab 04/28/12 0917  AMMONIA 14   CBC:  Lab 04/28/12 0548 04/27/12 1531 04/25/12 2316  WBC 9.0 10.1 9.8  NEUTROABS -- 6.5 5.1  HGB 11.3* 12.7 10.8*  HCT 34.0* 38.4 33.5*  MCV 85.6 85.3 85.9  PLT 131* 159 161   Cardiac Enzymes:  Lab 04/25/12 2316  CKTOTAL 34  CKMB --  CKMBINDEX --  TROPONINI <0.30   BNP (last 3 results) No results found for this basename: PROBNP:3 in the last 8760 hours CBG:  Lab 05/01/12 0704 04/30/12 2227 04/30/12 1643 04/30/12 1114 04/30/12 0710  GLUCAP 137* 138* 159* 233* 130*   No results found for this basename: HGBA1C:5 in the last 72 hours Other  Labs: No components found with this basename: POCBNP:3 No results found for this basename: DDIMER:2 in the last 168 hours No results found for this basename: CHOL:2,HDL:2,LDLCALC:2,TRIG:2,CHOLHDL:2,LDLDIRECT:2 in the last 168 hours No results found for this basename: TSH,T4TOTAL,FREET3,T3FREE,FREET4,THYROIDAB in the last 168 hours  Lab 04/28/12 0925  VITAMINB12 451  FOLATE 11.6  FERRITIN --  TIBC --  IRON --  RETICCTPCT --    Micro Results: No results found for this or any previous visit (from the past 240 hour(s)).  Studies/Results: No results found.  Medications: I have reviewed the patient's current medications. Scheduled Meds:    . amLODipine  2.5 mg Oral Daily  . bisoprolol-hydrochlorothiazide  1 tablet Oral Daily  . enoxaparin  40 mg Subcutaneous Q24H  . insulin aspart  0-9 Units Subcutaneous TID WC  . levothyroxine  50 mcg Oral Daily  . lisinopril  5 mg Oral Daily  . pantoprazole  40 mg Oral Daily  . pneumococcal 23 valent vaccine  0.5 mL Intramuscular Tomorrow-1000  . QUEtiapine  12.5 mg Oral QHS  . DISCONTD: amLODipine  5 mg Oral Daily  . DISCONTD: lisinopril  20 mg Oral Daily   Continuous Infusions:  PRN Meds:.acetaminophen, acetaminophen, hydrALAZINE, hyoscyamine, ondansetron (ZOFRAN)  IV, ondansetron  Assessment/Plan: Acute delirium/Altered mental status/Encephalopathy  Improved, "sundowning during the night." Head CT and MRI of the brain done no acute infarct. Start low dose seroquel to help with sleep at night. Infectious workup negative with negative urinalysis and chest x-ray. Per discussion with patient's daughters, has been declining mentally over the last few years, uncertain if patient has underlying dementia which has progressed.   Type 2 diabetes with complications  Stable. Has been taken off of diabetic medications recently. Sensitive SSI. Diet controlled.  Recent peritonitis  Stable. Not an active issue at this time. Abdominal x-ray shows  nonobstructive bowel gas patter and no free intraperitoneal gas.   Hypertension  Stable. Required holding lisinopril and amlodipine yesterday. Decrease the dose of lisinopril to 5 mg daily and decrease amlodipine to 2.5 mg daily. Continue bisoprolol-HCTZ.  Arthritis  Stable   Hypothyroidism  Continue synthroid.   Prophylaxis  Lovenox.   CODE STATUS  Full code.  Disposition DC to SNF once bed is available. Patient stable for discharge.   LOS: 4 days  Shreyan Hinz A, MD 05/01/2012, 9:15 AM

## 2012-05-02 DIAGNOSIS — R404 Transient alteration of awareness: Secondary | ICD-10-CM

## 2012-05-02 DIAGNOSIS — I10 Essential (primary) hypertension: Secondary | ICD-10-CM

## 2012-05-02 DIAGNOSIS — E039 Hypothyroidism, unspecified: Secondary | ICD-10-CM

## 2012-05-02 DIAGNOSIS — E118 Type 2 diabetes mellitus with unspecified complications: Secondary | ICD-10-CM

## 2012-05-02 MED ORDER — IBUPROFEN 400 MG PO TABS: 400.0000 mg | ORAL_TABLET | Freq: Four times a day (QID) | ORAL | Status: AC | PRN

## 2012-05-02 MED ORDER — QUETIAPINE 12.5 MG HALF TABLET: 12.5000 mg | ORAL_TABLET | Freq: Every day | ORAL | Status: DC

## 2012-05-02 MED ORDER — LISINOPRIL 5 MG PO TABS: 5.0000 mg | ORAL_TABLET | Freq: Every day | ORAL | Status: DC

## 2012-05-02 MED ORDER — POLYVINYL ALCOHOL 1.4 % OP SOLN: 1.0000 [drp] | OPHTHALMIC | Status: AC | PRN

## 2012-05-02 MED ORDER — AMLODIPINE BESYLATE 2.5 MG PO TABS: 2.5000 mg | ORAL_TABLET | Freq: Every day | ORAL | Status: DC

## 2012-05-02 NOTE — Progress Notes (Signed)
Physical Therapy Treatment Patient Details Name: Megan [name] MRN: [id number] DOB: 04-12-1927 Today's Date: 05/02/2012 Time: 0832-0902 PT Time Calculation (min): 30 min  PT Assessment / Plan / Recommendation Comments on Treatment Session  Difficulty getting pt to follow any commands. Only gets up when she wants to but when she initiates she does OK. Still needing +2 for all mobility because of balance and her unpredictability.     Follow Up Recommendations  Skilled nursing facility    Barriers to Discharge        Equipment Recommendations  Defer to next venue    Recommendations for Other Services    Frequency Min 2X/week   Plan Discharge plan remains appropriate;Frequency needs to be updated    Precautions / Restrictions Precautions Precautions: Fall Precaution Comments: confused       Mobility  Bed Mobility Bed Mobility: Supine to Sit Supine to Sit: 1: +2 Total assist Supine to Sit: Patient Percentage: 40% Details for Bed Mobility Assistance: attempted bed mobility initially but pt very resistant pushing therapist away, with time and need to use the bathroom pt more agreeable and able to sit up with facilitation to bring weight anteriorly and upright sitting; use of pad to scoot Transfers Transfers: Stand Pivot Transfers Stand Pivot Transfers: 1: +2 Total assist Stand Pivot Transfers: Patient Percentage: 40% Details for Transfer Assistance: SPT x2 bed->3in1->recliner; max cues to sequencing hand placement and stepping, taking increased time for task as pt trying to straighten the chair up prior to sitting    Exercises      PT Goals Acute Rehab PT Goals PT Goal: Supine/Side to Sit - Progress: Progressing toward goal PT Goal: Sit to Stand - Progress: Progressing toward goal PT Goal: Stand to Sit - Progress: Progressing toward goal PT Transfer Goal: Bed to Chair/Chair to Bed - Progress: Progressing toward goal PT Goal: Ambulate - Progress: Not progressing  Visit  Information  Last PT Received On: 05/02/12 Assistance Needed: +2    Subjective Data  Subjective: Ya'll don't need to be pulling on me. I'll do when I do.    Cognition  Overall Cognitive Status: Impaired Area of Impairment: Attention;Following commands;Memory;Safety/judgement;Awareness of errors;Awareness of deficits;Problem solving Arousal/Alertness: Awake/alert Orientation Level: Place;Time;Situation Behavior During Session: [facility] for tasks performed Current Attention Level: Sustained Following Commands: Follows one step commands with increased time;Follows one step commands inconsistently Safety/Judgement: Decreased awareness of safety precautions;Decreased safety judgement for tasks assessed;Decreased awareness of need for assistance Awareness of Errors: Assistance required to identify errors made;Assistance required to correct errors made    Balance     End of Session PT - End of Session Equipment Utilized During Treatment: Gait belt Activity Tolerance: Treatment limited secondary to agitation   GP     [facility] HELEN 05/02/2012, 11:20 AM

## 2012-05-02 NOTE — Progress Notes (Signed)
Pt d/c to SNF via [facility], will call to give report.

## 2012-05-02 NOTE — Clinical Social Work Note (Signed)
Pt is ready to discharge to [facility]. Facility has received discharge summary and is ready to accept to pt. Pt and family are agreeable to discharge plan. [facility] will provide transportation to facility. CSW is signing off as no further needs identified.   Dede Query, MSW, [place] [facility] [phone]

## 2012-05-02 NOTE — Progress Notes (Signed)
Subjective: Patient will slept better last night with Seroquel.  No specific complaints.  Objective: Vital signs in last 24 hours: Filed Vitals:   05/01/12 1318 05/01/12 1400 05/01/12 2113 05/02/12 0511  BP: 132/75 130/78 135/82   Pulse: 86 88 96   Temp:  98.6 F (37 C) 98.4 F (36.9 C)   TempSrc:  Oral Oral   Resp:  18 18   Height:      Weight:    64.91 kg (143 lb 1.6 oz)  SpO2:  98% 99%    Weight change:  No intake or output data in the 24 hours ending 05/02/12 1034  Physical Exam: General: Awake, No acute distress. HEENT: EOMI. Neck: Supple CV: S1 and S2 Lungs: Clear to ascultation bilaterally Abdomen: Soft, Nontender, Nondistended, +bowel sounds. Ext: Good pulses. Trace edema.  Lab Results: Basic Metabolic Panel:  Lab 04/28/12 1610 04/27/12 1531 04/25/12 2316  NA 137 138 137  K 3.9 3.7 3.6  CL 102 100 102  CO2 24 27 26   GLUCOSE 121* 115* 107*  BUN 12 14 13   CREATININE 0.56 0.56 0.64  CALCIUM 9.3 9.8 9.7  MG -- -- --  PHOS -- -- --   Liver Function Tests:  Lab 04/27/12 1531 04/25/12 2316  AST 13 12  ALT <5 <5  ALKPHOS 53 51  BILITOT 0.8 0.4  PROT 7.0 6.5  ALBUMIN 3.2* 3.0*   No results found for this basename: LIPASE:5,AMYLASE:5 in the last 168 hours  Lab 04/28/12 0917  AMMONIA 14   CBC:  Lab 04/28/12 0548 04/27/12 1531 04/25/12 2316  WBC 9.0 10.1 9.8  NEUTROABS -- 6.5 5.1  HGB 11.3* 12.7 10.8*  HCT 34.0* 38.4 33.5*  MCV 85.6 85.3 85.9  PLT 131* 159 161   Cardiac Enzymes:  Lab 04/25/12 2316  CKTOTAL 34  CKMB --  CKMBINDEX --  TROPONINI <0.30   BNP (last 3 results) No results found for this basename: PROBNP:3 in the last 8760 hours CBG:  Lab 05/02/12 0642 05/01/12 2214 05/01/12 1620 05/01/12 1200 05/01/12 0704  GLUCAP 135* 155* 142* 186* 137*   No results found for this basename: HGBA1C:5 in the last 72 hours Other Labs: No components found with this basename: POCBNP:3 No results found for this basename: DDIMER:2 in the last  168 hours No results found for this basename: CHOL:2,HDL:2,LDLCALC:2,TRIG:2,CHOLHDL:2,LDLDIRECT:2 in the last 168 hours No results found for this basename: TSH,T4TOTAL,FREET3,T3FREE,FREET4,THYROIDAB in the last 168 hours  Lab 04/28/12 0925  VITAMINB12 451  FOLATE 11.6  FERRITIN --  TIBC --  IRON --  RETICCTPCT --    Micro Results: No results found for this or any previous visit (from the past 240 hour(s)).  Studies/Results: No results found.  Medications: I have reviewed the patient's current medications. Scheduled Meds:    . amLODipine  2.5 mg Oral Daily  . bisoprolol-hydrochlorothiazide  1 tablet Oral Daily  . enoxaparin  40 mg Subcutaneous Q24H  . insulin aspart  0-9 Units Subcutaneous TID WC  . levothyroxine  50 mcg Oral Daily  . lisinopril  5 mg Oral Daily  . pantoprazole  40 mg Oral Daily  . QUEtiapine  12.5 mg Oral QHS   Continuous Infusions:  PRN Meds:.acetaminophen, acetaminophen, hydrALAZINE, hyoscyamine, ibuprofen, ondansetron (ZOFRAN) IV, ondansetron, polyvinyl alcohol  Assessment/Plan: Acute delirium/Altered mental status/Encephalopathy  Improved, "sundowning during the night." Head CT and MRI of the brain done no acute infarct.  Improved with Seroquel at night for sleep. Infectious workup negative with negative urinalysis and  chest x-ray. Per discussion with patient's daughters, has been declining mentally over the last few years, uncertain if patient has underlying dementia which has progressed.   Type 2 diabetes with complications  Stable. Has been taken off of diabetic medications recently. Sensitive SSI. Diet controlled.  Recent peritonitis  Stable. Not an active issue at this time. Abdominal x-ray shows nonobstructive bowel gas patter and no free intraperitoneal gas.   Hypertension  Stable. Required holding lisinopril and amlodipine on 04/30/2012.  Continue decreased dose of lisinopril to 5 mg daily and amlodipine to 2.5 mg daily. Continue  bisoprolol-HCTZ.  Further titration as outpatient.  Arthritis  Stable   Hypothyroidism  Continue synthroid.   Prophylaxis  Lovenox.   CODE STATUS  Full code.  Disposition DC to SNF once bed is available. Patient stable for discharge.   LOS: 5 days  Laurieann Friddle A, MD 05/02/2012, 10:34 AM

## 2012-05-03 NOTE — Care Management Note (Signed)
    Page 1 of 1   05/03/2012     10:00:29 AM   CARE MANAGEMENT NOTE 05/03/2012  Patient:  Megan [name], Megan [name]   Account Number:  [id number]  Date Initiated:  05/02/2012  Documentation initiated by:  Onnie Boer  Subjective/Objective Assessment:   PT WAS ADMITTED FOR AMS     Action/Plan:   PROGRESSION OF CARE AND DISCHARGE PLANNING   Anticipated DC Date:  05/02/2012   Anticipated DC Plan:  SKILLED NURSING FACILITY  In-house referral  Clinical Social Worker      DC Planning Services  CM consult      Choice offered to / List presented to:             Status of service:  Completed, signed off Medicare Important Message given?   (If response is "NO", the following Medicare IM given date fields will be blank) Date Medicare IM given:   Date Additional Medicare IM given:    Discharge Disposition:  SKILLED NURSING FACILITY  Per UR Regulation:  Reviewed for med. necessity/level of care/duration of stay  If discussed at Long Length of Stay Meetings, dates discussed:    Comments:  05/02/12 Onnie Boer, RN, BSN PT WAS [facility]'D [name]

## 2012-05-27 ENCOUNTER — Encounter (HOSPITAL_COMMUNITY): Payer: Self-pay

## 2012-05-27 ENCOUNTER — Emergency Department (HOSPITAL_COMMUNITY): Payer: Medicare Other

## 2012-05-27 ENCOUNTER — Inpatient Hospital Stay (HOSPITAL_COMMUNITY)
Admission: EM | Admit: 2012-05-27 | Discharge: 2012-06-11 | DRG: 336 | Disposition: A | Discharge status: Home / Self Care | Payer: Medicare Other | Attending: Surgery | Admitting: Surgery

## 2012-05-27 DIAGNOSIS — R410 Disorientation, unspecified: Secondary | ICD-10-CM

## 2012-05-27 DIAGNOSIS — D72829 Elevated white blood cell count, unspecified: Secondary | ICD-10-CM | POA: Diagnosis present

## 2012-05-27 DIAGNOSIS — R112 Nausea with vomiting, unspecified: Secondary | ICD-10-CM

## 2012-05-27 DIAGNOSIS — R1110 Vomiting, unspecified: Secondary | ICD-10-CM

## 2012-05-27 DIAGNOSIS — J9600 Acute respiratory failure, unspecified whether with hypoxia or hypercapnia: Secondary | ICD-10-CM

## 2012-05-27 DIAGNOSIS — N19 Unspecified kidney failure: Secondary | ICD-10-CM

## 2012-05-27 DIAGNOSIS — E876 Hypokalemia: Secondary | ICD-10-CM | POA: Diagnosis not present

## 2012-05-27 DIAGNOSIS — N179 Acute kidney failure, unspecified: Secondary | ICD-10-CM | POA: Diagnosis present

## 2012-05-27 DIAGNOSIS — R9431 Abnormal electrocardiogram [ECG] [EKG]: Secondary | ICD-10-CM

## 2012-05-27 DIAGNOSIS — E039 Hypothyroidism, unspecified: Secondary | ICD-10-CM | POA: Diagnosis present

## 2012-05-27 DIAGNOSIS — I10 Essential (primary) hypertension: Secondary | ICD-10-CM | POA: Diagnosis present

## 2012-05-27 DIAGNOSIS — E119 Type 2 diabetes mellitus without complications: Secondary | ICD-10-CM | POA: Diagnosis present

## 2012-05-27 DIAGNOSIS — F17200 Nicotine dependence, unspecified, uncomplicated: Secondary | ICD-10-CM | POA: Diagnosis present

## 2012-05-27 DIAGNOSIS — K56609 Unspecified intestinal obstruction, unspecified as to partial versus complete obstruction: Secondary | ICD-10-CM | POA: Diagnosis present

## 2012-05-27 DIAGNOSIS — K565 Intestinal adhesions [bands], unspecified as to partial versus complete obstruction: Principal | ICD-10-CM | POA: Diagnosis present

## 2012-05-27 DIAGNOSIS — E1129 Type 2 diabetes mellitus with other diabetic kidney complication: Secondary | ICD-10-CM | POA: Diagnosis present

## 2012-05-27 DIAGNOSIS — M1990 Unspecified osteoarthritis, unspecified site: Secondary | ICD-10-CM

## 2012-05-27 DIAGNOSIS — E118 Type 2 diabetes mellitus with unspecified complications: Secondary | ICD-10-CM

## 2012-05-27 DIAGNOSIS — I519 Heart disease, unspecified: Secondary | ICD-10-CM

## 2012-05-27 LAB — COMPREHENSIVE METABOLIC PANEL
BUN: 53 mg/dL — ABNORMAL HIGH (ref 6–23)
CO2: 27 mEq/L (ref 19–32)
Calcium: 10.2 mg/dL (ref 8.4–10.5)
Creatinine, Ser: 2.18 mg/dL — ABNORMAL HIGH (ref 0.50–1.10)
GFR calc Af Amer: 23 mL/min — ABNORMAL LOW (ref >90)
GFR calc non Af Amer: 20 mL/min — ABNORMAL LOW (ref >90)
Glucose, Bld: 247 mg/dL — ABNORMAL HIGH (ref 70–99)

## 2012-05-27 LAB — CBC WITH DIFFERENTIAL/PLATELET
Eosinophils Relative: 0 % (ref 0–5)
HCT: 39.3 % (ref 36.0–46.0)
Lymphocytes Relative: 12 % (ref 12–46)
Lymphs Abs: 0.9 10*3/uL (ref 0.7–4.0)
MCV: 86.8 fL (ref 78.0–100.0)
Monocytes Absolute: 0.8 10*3/uL (ref 0.1–1.0)
Monocytes Relative: 11 % (ref 3–12)
RBC: 4.53 MIL/uL (ref 3.87–5.11)
RDW: 15.4 % (ref 11.5–15.5)
WBC: 7.5 10*3/uL (ref 4.0–10.5)

## 2012-05-27 LAB — URINALYSIS, ROUTINE W REFLEX MICROSCOPIC
Glucose, UA: NEGATIVE mg/dL
Hgb urine dipstick: NEGATIVE
Protein, ur: NEGATIVE mg/dL

## 2012-05-27 LAB — CREATININE, SERUM
Creatinine, Ser: 2.17 mg/dL — ABNORMAL HIGH (ref 0.50–1.10)
GFR calc Af Amer: 23 mL/min — ABNORMAL LOW (ref >90)
GFR calc non Af Amer: 20 mL/min — ABNORMAL LOW (ref >90)

## 2012-05-27 LAB — URINE MICROSCOPIC-ADD ON

## 2012-05-27 LAB — CBC
Platelets: 214 10*3/uL (ref 150–400)
RDW: 15.5 % (ref 11.5–15.5)
WBC: 6.6 10*3/uL (ref 4.0–10.5)

## 2012-05-27 LAB — TYPE AND SCREEN
ABO/RH(D): O POS
Antibody Screen: NEGATIVE

## 2012-05-27 MED ORDER — ONDANSETRON HCL 4 MG/2ML IJ SOLN: 4.0000 mg | Freq: Three times a day (TID) | INTRAMUSCULAR | Status: AC | PRN

## 2012-05-27 MED ORDER — ONDANSETRON HCL 4 MG/2ML IJ SOLN
4.0000 mg | Freq: Four times a day (QID) | INTRAMUSCULAR | Status: DC | PRN
  Administered 2012-05-27 – 2012-05-31 (×6): 4 mg via INTRAVENOUS
  Filled 2012-05-27 (×6): qty 2

## 2012-05-27 MED ORDER — ONDANSETRON HCL 4 MG PO TABS
4.0000 mg | ORAL_TABLET | Freq: Four times a day (QID) | ORAL | Status: DC | PRN
  Administered 2012-06-03: 4 mg via ORAL
  Filled 2012-05-27: qty 1

## 2012-05-27 MED ORDER — DONEPEZIL HCL 5 MG PO TABS
5.0000 mg | ORAL_TABLET | Freq: Every day | ORAL | Status: DC
  Administered 2012-05-27 – 2012-06-03 (×8): 5 mg via ORAL
  Filled 2012-05-27 (×8): qty 1

## 2012-05-27 MED ORDER — KCL IN DEXTROSE-NACL 20-5-0.45 MEQ/L-%-% IV SOLN
INTRAVENOUS | Status: DC
  Administered 2012-05-27 – 2012-05-29 (×4): via INTRAVENOUS
  Administered 2012-05-29: 100 mL via INTRAVENOUS
  Administered 2012-05-30 (×2): 100 mL/h via INTRAVENOUS
  Administered 2012-05-31: 05:00:00 via INTRAVENOUS
  Administered 2012-06-01: 60 mL/h via INTRAVENOUS
  Administered 2012-06-01: 21:00:00 via INTRAVENOUS
  Administered 2012-06-02: 60 mL/h via INTRAVENOUS
  Administered 2012-06-03: 08:00:00 via INTRAVENOUS
  Filled 2012-05-27 (×19): qty 1000

## 2012-05-27 MED ORDER — ACETAMINOPHEN 650 MG RE SUPP: 650.0000 mg | Freq: Four times a day (QID) | RECTAL | Status: DC | PRN

## 2012-05-27 MED ORDER — TRAVOPROST (BAK FREE) 0.004 % OP SOLN
1.0000 [drp] | Freq: Every day | OPHTHALMIC | Status: DC
  Administered 2012-05-27 – 2012-06-10 (×15): 1 [drp] via OPHTHALMIC
  Filled 2012-05-27 (×2): qty 2.5

## 2012-05-27 MED ORDER — HEPARIN SODIUM (PORCINE) 5000 UNIT/ML IJ SOLN
5000.0000 [IU] | Freq: Three times a day (TID) | INTRAMUSCULAR | Status: DC
  Administered 2012-05-27 – 2012-06-11 (×42): 5000 [IU] via SUBCUTANEOUS
  Filled 2012-05-27 (×47): qty 1

## 2012-05-27 MED ORDER — PANTOPRAZOLE SODIUM 40 MG PO TBEC
40.0000 mg | DELAYED_RELEASE_TABLET | Freq: Every day | ORAL | Status: DC
  Administered 2012-05-27 – 2012-05-31 (×5): 40 mg via ORAL
  Filled 2012-05-27 (×4): qty 1

## 2012-05-27 MED ORDER — LEVOTHYROXINE SODIUM 50 MCG PO TABS
50.0000 ug | ORAL_TABLET | Freq: Every day | ORAL | Status: DC
  Administered 2012-05-27 – 2012-06-01 (×6): 50 ug via ORAL
  Filled 2012-05-27 (×6): qty 1

## 2012-05-27 MED ORDER — POLYVINYL ALCOHOL 1.4 % OP SOLN
1.0000 [drp] | OPHTHALMIC | Status: DC | PRN
  Filled 2012-05-27: qty 15

## 2012-05-27 MED ORDER — SODIUM CHLORIDE 0.9 % IV SOLN
INTRAVENOUS | Status: AC
  Administered 2012-05-27: 19:00:00 via INTRAVENOUS

## 2012-05-27 MED ORDER — INSULIN ASPART 100 UNIT/ML ~~LOC~~ SOLN
0.0000 [IU] | Freq: Three times a day (TID) | SUBCUTANEOUS | Status: DC
  Administered 2012-05-28: 3 [IU] via SUBCUTANEOUS
  Administered 2012-05-28: 5 [IU] via SUBCUTANEOUS
  Administered 2012-05-29: 3 [IU] via SUBCUTANEOUS
  Administered 2012-05-29 – 2012-05-30 (×3): 2 [IU] via SUBCUTANEOUS
  Administered 2012-05-30 – 2012-05-31 (×3): 3 [IU] via SUBCUTANEOUS
  Administered 2012-05-31 – 2012-06-01 (×2): 2 [IU] via SUBCUTANEOUS
  Administered 2012-06-01 – 2012-06-02 (×2): 3 [IU] via SUBCUTANEOUS
  Administered 2012-06-02: 2 [IU] via SUBCUTANEOUS
  Administered 2012-06-03 – 2012-06-04 (×3): 3 [IU] via SUBCUTANEOUS

## 2012-05-27 MED ORDER — SODIUM CHLORIDE 0.9 % IV BOLUS (SEPSIS)
1000.0000 mL | Freq: Once | INTRAVENOUS | Status: AC
  Administered 2012-05-27: 1000 mL via INTRAVENOUS

## 2012-05-27 MED ORDER — ACETAMINOPHEN 325 MG PO TABS
650.0000 mg | ORAL_TABLET | Freq: Four times a day (QID) | ORAL | Status: DC | PRN
  Administered 2012-06-02 – 2012-06-10 (×10): 650 mg via ORAL
  Filled 2012-05-27 (×7): qty 2
  Filled 2012-05-27: qty 1
  Filled 2012-05-27 (×3): qty 2

## 2012-05-27 NOTE — ED Notes (Signed)
IV team paged.  

## 2012-05-27 NOTE — ED Notes (Signed)
Pt here for vomiting all night long last night, denies pain

## 2012-05-27 NOTE — ED Provider Notes (Signed)
History     CSN: [id number]  Arrival date & time 05/27/12  1141   First MD Initiated Contact with Patient 05/27/12 1255      Chief Complaint  Patient presents with  . Abdominal Pain  . Emesis     The history is provided by the patient and medical records.   the patient reports she began vomiting last night and that her vomit is dark brown in color.  She denies melena or hematochezia.  She had a perforated ulcer that was repaired laparoscopicallyin May of 2013.  She was seen by her physician today and sent to the emergency department for evaluation.  She reports mild epigastric, pain at this time.  She reports generalized weakness.  She denies diarrhea.  She has no fevers or chills.  She denies chest pain or shortness of breath.  She reports decreased oral intake over the past 12 hours secondary vomiting.  Her symptoms are mild to moderate in severity.  Nothing worsens or improves her symptoms    Past Medical History  Diagnosis Date  . Hypertension   . Diastolic dysfunction, left ventricle by ECHO 2011 03/25/2012  . Hypothyroidism 03/25/2012  . High cholesterol   . Type II diabetes mellitus   . H/O hiatal hernia   . Duodenal perforation 03/23/12  . Arthritis     "in my back"    Past Surgical History  Procedure Date  . Bladder tacked   . Repair perforated ulcer 03/23/12  . Diagnostic laparoscopy 03/25/12    REPAIR OF PERFORATED ULCER with omental patch  . Abcess drainage 03/25/12     of abdominal & pelvic abscesses  . Laparoscopy 03/24/2012    Procedure: LAPAROSCOPY DIAGNOSTIC;  Surgeon: Ardeth Sportsman, MD;  Location: [facility] OR;  Service: General;  Laterality: N/A;  drainage of abdominal and pelvic abcesses    No family history on file.  History  Substance Use Topics  . Smoking status: Never Smoker   . Smokeless tobacco: Current User    Types: Snuff   Comment: 03/28/12 "use snuff q once in while; not regular"  . Alcohol Use: No    OB History    Grav Para Term Preterm  Abortions TAB SAB Ect Mult Living                  Review of Systems  All other systems reviewed and are negative.    Allergies  Review of patient's allergies indicates no known allergies.  Home Medications   Current Outpatient Rx  Name Route Sig Dispense Refill  . AMLODIPINE BESYLATE 5 MG PO TABS Oral Take 2.5 mg by mouth daily.    Marland Kitchen BISOPROLOL-HYDROCHLOROTHIAZIDE 5-6.25 MG PO TABS Oral Take 1 tablet by mouth daily.    Marland Kitchen CLORAZEPATE DIPOTASSIUM 7.5 MG PO TABS Oral Take 7.5 mg by mouth at bedtime.    . DONEPEZIL HCL 10 MG PO TABS Oral Take 5 mg by mouth daily.    Marland Kitchen LEVOTHYROXINE SODIUM 50 MCG PO TABS Oral Take 50 mcg by mouth daily.    Marland Kitchen LISINOPRIL 20 MG PO TABS Oral Take 20 mg by mouth daily.    Marland Kitchen PANTOPRAZOLE SODIUM 40 MG PO TBEC Oral Take 40 mg by mouth daily.    . QUETIAPINE FUMARATE ER 50 MG PO TB24 Oral Take 50 mg by mouth daily.    . TRAVOPROST (BAK FREE) 0.004 % OP SOLN Both Eyes Place 1 drop into both eyes at bedtime.    Marland Kitchen POLYVINYL ALCOHOL  1.4 % OP SOLN Both Eyes Place 1 drop into both eyes as needed. 15 mL     BP 96/62  Pulse 93  Resp 15  Ht 5\' 7"  (1.702 m)  Wt 140 lb (63.504 kg)  BMI 21.93 kg/m2  SpO2 99%  Physical Exam  Nursing note and vitals reviewed. Constitutional: She is oriented to person, place, and time. She appears well-developed and well-nourished. No distress.  HENT:  Head: Normocephalic and atraumatic.  Eyes: EOM are normal.  Neck: Normal range of motion.  Cardiovascular: Normal rate, regular rhythm and normal heart sounds.   Pulmonary/Chest: Effort normal and breath sounds normal.  Abdominal: Soft. She exhibits no distension. There is no tenderness.  Genitourinary:       Brown stool.  No gross blood  Musculoskeletal: Normal range of motion.  Neurological: She is alert and oriented to person, place, and time.  Skin: Skin is warm and dry.  Psychiatric: She has a normal mood and affect. Judgment normal.    ED Course  Procedures (including  critical care time)  Labs Reviewed  URINALYSIS, ROUTINE W REFLEX MICROSCOPIC - Abnormal; Notable for the following:    Color, Urine AMBER (*)  BIOCHEMICALS MAY BE AFFECTED BY COLOR   Bilirubin Urine SMALL (*)     Leukocytes, UA TRACE (*)     All other components within normal limits  COMPREHENSIVE METABOLIC PANEL - Abnormal; Notable for the following:    Chloride 92 (*)     Glucose, Bld 247 (*)     BUN 53 (*)     Creatinine, Ser 2.18 (*)     GFR calc non Af Amer 20 (*)     GFR calc Af Amer 23 (*)     All other components within normal limits  URINE MICROSCOPIC-ADD ON - Abnormal; Notable for the following:    Squamous Epithelial / LPF FEW (*)     Bacteria, UA MANY (*)     Casts HYALINE CASTS (*)     All other components within normal limits  CBC WITH DIFFERENTIAL  LIPASE, BLOOD  TYPE AND SCREEN  OCCULT BLOOD, POC DEVICE  ABO/RH   Dg Abd 2 Views  05/27/2012  *RADIOLOGY REPORT*  Clinical Data: Altered mental status.  Question free intraperitoneal air.  ABDOMEN - 2 VIEW  Comparison: Two views the abdomen 04/27/2012.  Findings: No free intraperitoneal air is identified.  There are gas filled and mildly dilated loops of small bowel measuring up to 3.4 cm in diameter with air-fluid levels present.  Lumbar scoliosis and degenerative change are noted.  Degenerative disease is also present about the hips, worse on the right.  IMPRESSION:  1.  Findings worrisome for small bowel obstruction. 2.  Negative for free intraperitoneal air.  Original Report Authenticated By: Bernadene Bell. Maricela Curet, M.D.    I personally reviewed the imaging tests through PACS system  I reviewed available ER/hospitalization records thought the EMR   1. Renal failure   2. Vomiting       MDM  The patient has evidence of new renal failure for which she'll need to be admitted and hydrated.  Her labs are otherwise without significant abnormality.  Hemoccult test is negative.  She'll need serial CBCs and workup of her  renal failure.  Hospitalist has been contacted to admit        Lyanne Co, MD 05/27/12 1544

## 2012-05-27 NOTE — ED Notes (Signed)
X 2 unsuccessful iv sticks.

## 2012-05-27 NOTE — H&P (Signed)
[facility] History and Physical  Megan [name] WUJ:[id number] DOB: 1927-01-25 DOA: 05/27/2012  Referring physician: Dr. [name] PCP: [name], MD   Chief Complaint: Weakness nausea and emesis  HPI:  Patient is an 78 y/o with history as indicated below but of not recent laparoscopic repair of perforated ulcer several months ago.  Is Presenting to the ED complaining of weakness, nausea, and emesis.  History is really limited in that patient is somnolent but arrousable and has limited responses to questions.  History is also obtained from chart.  Reportedly patient has had decrease in oral intake and reports that yesterday evening she had bouts of emesis.  Did not confirm when her nausea and emesis began.  Denied any abdominal pain associated with this.    In the Ed was found to have elevated BUN/Creatinine ratio 53/2.18.  Her WBC was WNL and patient was afebrile.  U/A showed a specific gravity of 1.026.  Patient also had an abdominal x ray which showed findings worrisome for small bowel obstruction.  Negative free intraperitoneal air.  (ED physician had concern about intraperitoneal air findings given recent history of abdominal surgery).    We were consulted for further evaluation and admission orders given patient's Acute renal failure and poor oral intake.  When I arrived at room in the ED patient did not have any IVF's running. I discussed with nurse who indicated that patient was a hard stick and that The IV access team had been called for placement of IV.    Review of Systems:  Unable to properly assess due to current condition and patient somnolence  Past Medical History  Diagnosis Date  . Hypertension   . Diastolic dysfunction, left ventricle by ECHO 2011 03/25/2012  . Hypothyroidism 03/25/2012  . High cholesterol   . Type II diabetes mellitus   . H/O hiatal hernia   . Duodenal perforation 03/23/12  . Arthritis     "in my back"   Past Surgical History    Procedure Date  . Bladder tacked   . Repair perforated ulcer 03/23/12  . Diagnostic laparoscopy 03/25/12    REPAIR OF PERFORATED ULCER with omental patch  . Abcess drainage 03/25/12     of abdominal & pelvic abscesses  . Laparoscopy 03/24/2012    Procedure: LAPAROSCOPY DIAGNOSTIC;  Surgeon: Ardeth Sportsman, MD;  Location: [facility] OR;  Service: General;  Laterality: N/A;  drainage of abdominal and pelvic abcesses   Social History:  reports that she has never smoked. Her smokeless tobacco use includes Snuff. She reports that she does not drink alcohol or use illicit drugs. Patient lives at home Unable to assess due to patient's somnolence and current clinical scenario:  Can patient participate in ADLs?  No Known Allergies  No family history on file. Patient stated that she did not know of any illnesses that ran in her family.  Prior to Admission medications   Medication Sig Start Date End Date Taking? Authorizing Provider  amLODipine (NORVASC) 5 MG tablet Take 2.5 mg by mouth daily.   Yes Historical Provider, MD  bisoprolol-hydrochlorothiazide (ZIAC) 5-6.25 MG per tablet Take 1 tablet by mouth daily.   Yes Historical Provider, MD  clorazepate (TRANXENE) 7.5 MG tablet Take 7.5 mg by mouth at bedtime.   Yes Historical Provider, MD  donepezil (ARICEPT) 10 MG tablet Take 5 mg by mouth daily.   Yes Historical Provider, MD  levothyroxine (SYNTHROID, LEVOTHROID) 50 MCG tablet Take 50 mcg by mouth daily.   Yes Historical  Provider, MD  lisinopril (PRINIVIL,ZESTRIL) 20 MG tablet Take 20 mg by mouth daily.   Yes Historical Provider, MD  pantoprazole (PROTONIX) 40 MG tablet Take 40 mg by mouth daily.   Yes Kathlen Mody, MD  QUEtiapine (SEROQUEL XR) 50 MG TB24 Take 50 mg by mouth daily.   Yes Historical Provider, MD  Travoprost, BAK Free, (TRAVATAN) 0.004 % SOLN ophthalmic solution Place 1 drop into both eyes at bedtime.   Yes Historical Provider, MD  polyvinyl alcohol (LIQUIFILM TEARS) 1.4 % ophthalmic  solution Place 1 drop into both eyes as needed. 05/02/12 06/01/12  Cristal Ford, MD   Physical Exam: Filed Vitals:   05/27/12 1143 05/27/12 1311  BP: 90/70 96/62  Pulse: 103 93  Resp: 18 15  Height:  5\' 7"  (1.702 m)  Weight:  63.504 kg (140 lb)  SpO2: 95% 99%     General:  Pt somnolent but arrousable in NAD  Eyes: EOMI, PERRLA  ENT: Dry mucous membranes, normal exterior appearance  Neck: supple, no massess  Cardiovascular: RRR, No MRG  Respiratory: CTA BL, No wheezes or rhales  Abdomen: Soft, ND, hypoactive bowel sounds  Skin: warm, dry.  With some skin tenting over abdomen  Musculoskeletal: no pain with movement of extremities  Psychiatric: Unable to properly assess due to somnolent state  Neurologic: Patient answers questions appropriately and moves all four extremities.  Labs on Admission:  Basic Metabolic Panel:  Lab 05/27/12 1610  NA 136  K 4.4  CL 92*  CO2 27  GLUCOSE 247*  BUN 53*  CREATININE 2.18*  CALCIUM 10.2  MG --  PHOS --   Liver Function Tests:  Lab 05/27/12 1151  AST 15  ALT 6  ALKPHOS 54  BILITOT 1.0  PROT 7.7  ALBUMIN 3.5    Lab 05/27/12 1151  LIPASE 12  AMYLASE --   No results found for this basename: AMMONIA:5 in the last 168 hours CBC:  Lab 05/27/12 1151  WBC 7.5  NEUTROABS 5.8  HGB 12.8  HCT 39.3  MCV 86.8  PLT 231   Cardiac Enzymes: No results found for this basename: CKTOTAL:5,CKMB:5,CKMBINDEX:5,TROPONINI:5 in the last 168 hours  BNP (last 3 results) No results found for this basename: PROBNP:3 in the last 8760 hours CBG: No results found for this basename: GLUCAP:5 in the last 168 hours  Radiological Exams on Admission: Dg Abd 2 Views  05/27/2012  *RADIOLOGY REPORT*  Clinical Data: Altered mental status.  Question free intraperitoneal air.  ABDOMEN - 2 VIEW  Comparison: Two views the abdomen 04/27/2012.  Findings: No free intraperitoneal air is identified.  There are gas filled and mildly dilated loops of  small bowel measuring up to 3.4 cm in diameter with air-fluid levels present.  Lumbar scoliosis and degenerative change are noted.  Degenerative disease is also present about the hips, worse on the right.  IMPRESSION:  1.  Findings worrisome for small bowel obstruction. 2.  Negative for free intraperitoneal air.  Original Report Authenticated By: Bernadene Bell. D'ALESSIO, M.D.    EKG: Independently reviewed. EKG last month reviewed and was sinus rhythm  Assessment/Plan Principal Problem:  *AKI (acute kidney injury) Active Problems:  DM (diabetes mellitus), type 2 with complications  Hypertension  Hypothyroidism Nausea and vomiting   1. AKI - Given physical findings and elevated BUN/Creatinine with concentrated urine given specific gravity this is most likely due to prerenal causes.  Will place patient NPO But place on IVF's.  Xray showed no free intraperitoneal air but  was worrisome for SBO.  Therefore will place on IVF's and monitor her creatinine and BUN levels. - Will consider further work up should creatinine level not improve with IVF hydration.  2. Hypertension - At this point will plan on holding patient's blood pressure medication her blood pressures in house have been low normal.  Most likely due to decreased intravascular state due to her poor oral intake and bouts of emesis  3. Nausea and Vomiting - Likely caused number 1 and 2.  Will plan on keeping patient NPO given possibility of SBO.  - Continue antiemetic - supportive therapy with IVF's at D5 1/2 NS with KCl. - Will also recommend fluid bolus prior to starting MIVF's - Should condition not improve will consider obtaining surgical consult.  4. Hypothyroidism - Continue home regimen.  Stable  5. DM - Once able to advance diet will place on diabetic diet - CBG checks - SSI  6) DVT prophylaxis - Heparin for DVT prophylaxis   Code Status: Unable to properly discuss with patient so for now continue prior Full  code Family Communication: None at bedside Disposition Plan: Pending clinical improvement  Time spent: > 60 minutes  Penny Pia [facility] Pager [phone]  If 7PM-7AM, please contact night-coverage [email or web address] Password [facility] 05/27/2012, 4:28 PM

## 2012-05-27 NOTE — ED Notes (Signed)
Called report to Megan [name]

## 2012-05-27 NOTE — ED Notes (Signed)
Family has information from physician office in the room. Son and daughter the bedside.

## 2012-05-28 ENCOUNTER — Inpatient Hospital Stay (HOSPITAL_COMMUNITY): Payer: Medicare Other

## 2012-05-28 DIAGNOSIS — K56609 Unspecified intestinal obstruction, unspecified as to partial versus complete obstruction: Secondary | ICD-10-CM

## 2012-05-28 LAB — CBC
HCT: 35.1 % — ABNORMAL LOW (ref 36.0–46.0)
Hemoglobin: 11.4 g/dL — ABNORMAL LOW (ref 12.0–15.0)
RBC: 4.04 MIL/uL (ref 3.87–5.11)

## 2012-05-28 LAB — HEMOGLOBIN A1C: Mean Plasma Glucose: 128 mg/dL — ABNORMAL HIGH (ref <117)

## 2012-05-28 LAB — BASIC METABOLIC PANEL
CO2: 30 mEq/L (ref 19–32)
Chloride: 100 mEq/L (ref 96–112)
Glucose, Bld: 255 mg/dL — ABNORMAL HIGH (ref 70–99)
Potassium: 3.4 mEq/L — ABNORMAL LOW (ref 3.5–5.1)
Sodium: 140 mEq/L (ref 135–145)

## 2012-05-28 LAB — CREATININE, SERUM
Creatinine, Ser: 1.02 mg/dL (ref 0.50–1.10)
GFR calc Af Amer: 58 mL/min — ABNORMAL LOW (ref >90)
GFR calc non Af Amer: 50 mL/min — ABNORMAL LOW (ref >90)

## 2012-05-28 LAB — GLUCOSE, CAPILLARY: Glucose-Capillary: 244 mg/dL — ABNORMAL HIGH (ref 70–99)

## 2012-05-28 MED ORDER — IOHEXOL 300 MG/ML  SOLN: 20.0000 mL | Freq: Once | INTRAMUSCULAR | Status: AC | PRN

## 2012-05-28 MED ORDER — IOHEXOL 300 MG/ML  SOLN
100.0000 mL | Freq: Once | INTRAMUSCULAR | Status: AC | PRN
  Administered 2012-05-28: 100 mL via INTRAVENOUS

## 2012-05-28 NOTE — Progress Notes (Addendum)
[facility] PROGRESS NOTE  Megan [name] BJY:[id number] DOB: 08/25/1927 DOA: 05/27/2012 PCP: [name], MD  Assessment/Plan: Principal Problem:  *AKI (acute kidney injury) Active Problems:  DM (diabetes mellitus), type 2 with complications  Hypertension  Hypothyroidism  Nausea with vomiting SBO  1. AKI - Improved on MIVF and due to prerenal causes.  - Creatinine is currently trending down  2 SBO - This morning patient was not having any emesis or complaints.  As a result I did not obtain a surgical consult and x rays of abdomen were suspicious for SBO.  Repeated abdominal x ray today and still suggestive of SBO.  Given emesis this evening will maintain NPO and discuss with surgery further interventions.  Patient is non distended.  - Addendum: Considering adding NG tube placement  3. Hypertension  - Patient is currently off of her antihypertensive medication.  Last blood pressure 138/59.  Will continue to monitor and make changes pending blood pressure readings.  4. Nausea and Vomiting  - Likely caused number 1.   - Continue antiemetic  - supportive therapy with IVF's at D5 1/2 NS with KCl.  -Likely secondary to suspected SBO. Place NG tube today and consider consulting surgery tomorrow.  5. Hypothyroidism  - Continue home regimen. Stable   6. DM  - Once able to advance diet will place on diabetic diet  - CBG checks  - SSI   7) DVT prophylaxis  - Heparin for DVT prophylaxis   Code Status:Full Family Communication: No family at bedside Disposition Plan: Pending clinical improvement in condition and resolution of symptoms suspicious for SBO   Brief narrative: Please refer to HPI  Consultants:  none  Procedures:  NG tube for today  Antibiotics:  none  HPI/Subjective: Patient this morning mentioned that she felt better and was denying any abdominal discomfort.  Mentioned that she did not have much of an appetite today.  No acute issues  reported overnight.  Denies any focal neurological weakness, bowel movement, BRBPR, hematuria, dysuria, fever, or chills.  Objective: Filed Vitals:   05/27/12 2130 05/28/12 0445 05/28/12 1150 05/28/12 1404  BP: 149/56 125/62 111/54 138/59  Pulse: 80 77 85 76  Temp: 98.4 F (36.9 C) 98.7 F (37.1 C) 98.8 F (37.1 C) 98.5 F (36.9 C)  TempSrc: Oral Oral Oral Oral  Resp: 18 17 18 18   Height:      Weight:      SpO2: 100% 99% 98% 100%    Intake/Output Summary (Last 24 hours) at 05/28/12 1710 Last data filed at 05/28/12 1653  Gross per 24 hour  Intake    935 ml  Output    525 ml  Net    410 ml    Exam:   General:  Pt in NAD, alert and awake  Cardiovascular: RRR, No MRG  Respiratory: CTA BL, no wheezes  Abdomen: Soft, NT, ND  Data Reviewed: Basic Metabolic Panel:  Lab 05/28/12 0865 05/28/12 0510 05/27/12 1900 05/27/12 1151  NA -- 140 -- 136  K -- 3.4* -- 4.4  CL -- 100 -- 92*  CO2 -- 30 -- 27  GLUCOSE -- 255* -- 247*  BUN -- 51* -- 53*  CREATININE 1.02 1.39* 2.17* 2.18*  CALCIUM -- 9.1 -- 10.2  MG -- -- -- --  PHOS -- -- -- --   Liver Function Tests:  Lab 05/27/12 1151  AST 15  ALT 6  ALKPHOS 54  BILITOT 1.0  PROT 7.7  ALBUMIN 3.5  Lab 05/27/12 1151  LIPASE 12  AMYLASE --   No results found for this basename: AMMONIA:5 in the last 168 hours CBC:  Lab 05/28/12 0510 05/27/12 1900 05/27/12 1151  WBC 5.1 6.6 7.5  NEUTROABS -- -- 5.8  HGB 11.4* 12.3 12.8  HCT 35.1* 37.3 39.3  MCV 86.9 86.1 86.8  PLT 193 214 231   Cardiac Enzymes: No results found for this basename: CKTOTAL:5,CKMB:5,CKMBINDEX:5,TROPONINI:5 in the last 168 hours BNP (last 3 results) No results found for this basename: PROBNP:3 in the last 8760 hours CBG:  Lab 05/28/12 1153 05/28/12 0735  GLUCAP 92 244*    No results found for this or any previous visit (from the past 240 hour(s)).   Studies: Dg Abd 2 Views  05/28/2012  *RADIOLOGY REPORT*  Clinical Data: 78 year old  female with abdominal pain and distention.  Possible small bowel obstruction.  ABDOMEN - 2 VIEW  Comparison: 05/27/2012 and prior abdominal radiographs  Findings: Gas and fluid filled small bowel loops are again identified with the dilated loop in the left lower abdomen/pelvis now not visualized. A small amount of gas in the colon is noted. There is no evidence of pneumoperitoneum. Bibasilar atelectasis and degenerative changes in the lumbar spine and hips are again noted.  IMPRESSION: Nonspecific bowel gas pattern again identified without pneumoperitoneum. Apparent slight improvement of distended bowel loops in the left lower abdomen. Small bowel obstruction is not excluded given these findings.  Original Report Authenticated By: Rosendo Gros, M.D.   Dg Abd 2 Views  05/27/2012  *RADIOLOGY REPORT*  Clinical Data: Altered mental status.  Question free intraperitoneal air.  ABDOMEN - 2 VIEW  Comparison: Two views the abdomen 04/27/2012.  Findings: No free intraperitoneal air is identified.  There are gas filled and mildly dilated loops of small bowel measuring up to 3.4 cm in diameter with air-fluid levels present.  Lumbar scoliosis and degenerative change are noted.  Degenerative disease is also present about the hips, worse on the right.  IMPRESSION:  1.  Findings worrisome for small bowel obstruction. 2.  Negative for free intraperitoneal air.  Original Report Authenticated By: Bernadene Bell. D'ALESSIO, M.D.    Scheduled Meds:   . sodium chloride   Intravenous STAT  . donepezil  5 mg Oral Daily  . heparin  5,000 Units Subcutaneous Q8H  . insulin aspart  0-15 Units Subcutaneous TID WC  . levothyroxine  50 mcg Oral Daily  . pantoprazole  40 mg Oral Daily  . sodium chloride  1,000 mL Intravenous Once  . Travoprost (BAK Free)  1 drop Both Eyes QHS   Continuous Infusions:   . dextrose 5 % and 0.45 % NaCl with KCl 20 mEq/L 125 mL/hr at 05/27/12 2104    Principal Problem:  *AKI (acute kidney  injury) Active Problems:  DM (diabetes mellitus), type 2 with complications  Hypertension  Hypothyroidism  Nausea with vomiting    Time spent: > 35 minutes    Penny Pia  [facility] Pager [phone]. If 8PM-8AM, please contact night-coverage at [email or web address], password [facility] 05/28/2012, 5:10 PM  LOS: 1 day

## 2012-05-28 NOTE — Consult Note (Signed)
Reason for Consult:abdominal distension Referring Physician: Shanigua Gibb is an 78 y.o. female.  HPI: Asked to see patient due to abdominal pain and distension for 2 days.  Last BM yesterday and still  having some flatus.  Vomiting is less today.  Plain films nonspecific but question bowel obstruction.  Pain is crampy and episodic.     5/10 intensity.  Nothing helps.  Past Medical History  Diagnosis Date  . Hypertension   . Diastolic dysfunction, left ventricle by ECHO 2011 03/25/2012  . Hypothyroidism 03/25/2012  . High cholesterol   . Type II diabetes mellitus   . H/O hiatal hernia   . Duodenal perforation 03/23/12  . Arthritis     "in my back"    Past Surgical History  Procedure Date  . Bladder tacked   . Repair perforated ulcer 03/23/12  . Diagnostic laparoscopy 03/25/12    REPAIR OF PERFORATED ULCER with omental patch  . Abcess drainage 03/25/12     of abdominal & pelvic abscesses  . Laparoscopy 03/24/2012    Procedure: LAPAROSCOPY DIAGNOSTIC;  Surgeon: Ardeth Sportsman, MD;  Location: [facility] OR;  Service: General;  Laterality: N/A;  drainage of abdominal and pelvic abcesses    No family history on file.  Social History:  reports that she has never smoked. Her smokeless tobacco use includes Snuff. She reports that she does not drink alcohol or use illicit drugs.  Allergies: No Known Allergies  Medications:  Prior to Admission:  Prescriptions prior to admission  Medication Sig Dispense Refill  . amLODipine (NORVASC) 5 MG tablet Take 2.5 mg by mouth daily.      . bisoprolol-hydrochlorothiazide (ZIAC) 5-6.25 MG per tablet Take 1 tablet by mouth daily.      . clorazepate (TRANXENE) 7.5 MG tablet Take 7.5 mg by mouth at bedtime.      . donepezil (ARICEPT) 10 MG tablet Take 5 mg by mouth daily.      Marland Kitchen levothyroxine (SYNTHROID, LEVOTHROID) 50 MCG tablet Take 50 mcg by mouth daily.      Marland Kitchen lisinopril (PRINIVIL,ZESTRIL) 20 MG tablet Take 20 mg by mouth daily.      .  pantoprazole (PROTONIX) 40 MG tablet Take 40 mg by mouth daily.      . QUEtiapine (SEROQUEL XR) 50 MG TB24 Take 50 mg by mouth daily.      . Travoprost, BAK Free, (TRAVATAN) 0.004 % SOLN ophthalmic solution Place 1 drop into both eyes at bedtime.      . polyvinyl alcohol (LIQUIFILM TEARS) 1.4 % ophthalmic solution Place 1 drop into both eyes as needed.  15 mL     Scheduled:   . sodium chloride   Intravenous STAT  . donepezil  5 mg Oral Daily  . heparin  5,000 Units Subcutaneous Q8H  . insulin aspart  0-15 Units Subcutaneous TID WC  . levothyroxine  50 mcg Oral Daily  . pantoprazole  40 mg Oral Daily  . sodium chloride  1,000 mL Intravenous Once  . Travoprost (BAK Free)  1 drop Both Eyes QHS    Results for orders placed during the hospital encounter of 05/27/12 (from the past 48 hour(s))  CBC WITH DIFFERENTIAL     Status: Normal   Collection Time   05/27/12 11:51 AM      Component Value Range Comment   WBC 7.5  4.0 - 10.5 K/uL    RBC 4.53  3.87 - 5.11 MIL/uL    Hemoglobin 12.8  12.0 -  15.0 g/dL    HCT 91.4  78.2 - 95.6 %    MCV 86.8  78.0 - 100.0 fL    MCH 28.3  26.0 - 34.0 pg    MCHC 32.6  30.0 - 36.0 g/dL    RDW 21.3  08.6 - 57.8 %    Platelets 231  150 - 400 K/uL    Neutrophils Relative 77  43 - 77 %    Neutro Abs 5.8  1.7 - 7.7 K/uL    Lymphocytes Relative 12  12 - 46 %    Lymphs Abs 0.9  0.7 - 4.0 K/uL    Monocytes Relative 11  3 - 12 %    Monocytes Absolute 0.8  0.1 - 1.0 K/uL    Eosinophils Relative 0  0 - 5 %    Eosinophils Absolute 0.0  0.0 - 0.7 K/uL    Basophils Relative 0  0 - 1 %    Basophils Absolute 0.0  0.0 - 0.1 K/uL   COMPREHENSIVE METABOLIC PANEL     Status: Abnormal   Collection Time   05/27/12 11:51 AM      Component Value Range Comment   Sodium 136  135 - 145 mEq/L    Potassium 4.4  3.5 - 5.1 mEq/L    Chloride 92 (*) 96 - 112 mEq/L    CO2 27  19 - 32 mEq/L    Glucose, Bld 247 (*) 70 - 99 mg/dL    BUN 53 (*) 6 - 23 mg/dL    Creatinine, Ser 4.69 (*)  0.50 - 1.10 mg/dL    Calcium 62.9  8.4 - 10.5 mg/dL    Total Protein 7.7  6.0 - 8.3 g/dL    Albumin 3.5  3.5 - 5.2 g/dL    AST 15  0 - 37 U/L HEMOLYSIS AT THIS LEVEL MAY AFFECT RESULT   ALT 6  0 - 35 U/L    Alkaline Phosphatase 54  39 - 117 U/L    Total Bilirubin 1.0  0.3 - 1.2 mg/dL    GFR calc non Af Amer 20 (*) >90 mL/min    GFR calc Af Amer 23 (*) >90 mL/min   LIPASE, BLOOD     Status: Normal   Collection Time   05/27/12 11:51 AM      Component Value Range Comment   Lipase 12  11 - 59 U/L   OCCULT BLOOD, POC DEVICE     Status: Normal   Collection Time   05/27/12  1:49 PM      Component Value Range Comment   Fecal Occult Bld NEGATIVE     TYPE AND SCREEN     Status: Normal   Collection Time   05/27/12  2:00 PM      Component Value Range Comment   ABO/RH(D) O POS      Antibody Screen NEG      Sample Expiration 05/30/2012     ABO/RH     Status: Normal   Collection Time   05/27/12  2:00 PM      Component Value Range Comment   ABO/RH(D) O POS     URINALYSIS, ROUTINE W REFLEX MICROSCOPIC     Status: Abnormal   Collection Time   05/27/12  3:14 PM      Component Value Range Comment   Color, Urine AMBER (*) YELLOW BIOCHEMICALS MAY BE AFFECTED BY COLOR   APPearance CLEAR  CLEAR    Specific Gravity, Urine 1.026  1.005 -  1.030    pH 5.0  5.0 - 8.0    Glucose, UA NEGATIVE  NEGATIVE mg/dL    Hgb urine dipstick NEGATIVE  NEGATIVE    Bilirubin Urine SMALL (*) NEGATIVE    Ketones, ur NEGATIVE  NEGATIVE mg/dL    Protein, ur NEGATIVE  NEGATIVE mg/dL    Urobilinogen, UA 0.2  0.0 - 1.0 mg/dL    Nitrite NEGATIVE  NEGATIVE    Leukocytes, UA TRACE (*) NEGATIVE   URINE MICROSCOPIC-ADD ON     Status: Abnormal   Collection Time   05/27/12  3:14 PM      Component Value Range Comment   Squamous Epithelial / LPF FEW (*) RARE    RBC / HPF 0-2  <3 RBC/hpf    Bacteria, UA MANY (*) RARE    Casts HYALINE CASTS (*) NEGATIVE   CBC     Status: Normal   Collection Time   05/27/12  7:00 PM       Component Value Range Comment   WBC 6.6  4.0 - 10.5 K/uL    RBC 4.33  3.87 - 5.11 MIL/uL    Hemoglobin 12.3  12.0 - 15.0 g/dL    HCT 16.1  09.6 - 04.5 %    MCV 86.1  78.0 - 100.0 fL    MCH 28.4  26.0 - 34.0 pg    MCHC 33.0  30.0 - 36.0 g/dL    RDW 40.9  81.1 - 91.4 %    Platelets 214  150 - 400 K/uL   CREATININE, SERUM     Status: Abnormal   Collection Time   05/27/12  7:00 PM      Component Value Range Comment   Creatinine, Ser 2.17 (*) 0.50 - 1.10 mg/dL    GFR calc non Af Amer 20 (*) >90 mL/min    GFR calc Af Amer 23 (*) >90 mL/min   HEMOGLOBIN A1C     Status: Abnormal   Collection Time   05/27/12  7:00 PM      Component Value Range Comment   Hemoglobin A1C 6.1 (*) <5.7 %    Mean Plasma Glucose 128 (*) <117 mg/dL   CBC     Status: Abnormal   Collection Time   05/28/12  5:10 AM      Component Value Range Comment   WBC 5.1  4.0 - 10.5 K/uL    RBC 4.04  3.87 - 5.11 MIL/uL    Hemoglobin 11.4 (*) 12.0 - 15.0 g/dL    HCT 78.2 (*) 95.6 - 46.0 %    MCV 86.9  78.0 - 100.0 fL    MCH 28.2  26.0 - 34.0 pg    MCHC 32.5  30.0 - 36.0 g/dL    RDW 21.3 (*) 08.6 - 15.5 %    Platelets 193  150 - 400 K/uL   BASIC METABOLIC PANEL     Status: Abnormal   Collection Time   05/28/12  5:10 AM      Component Value Range Comment   Sodium 140  135 - 145 mEq/L    Potassium 3.4 (*) 3.5 - 5.1 mEq/L DELTA CHECK NOTED   Chloride 100  96 - 112 mEq/L    CO2 30  19 - 32 mEq/L    Glucose, Bld 255 (*) 70 - 99 mg/dL    BUN 51 (*) 6 - 23 mg/dL    Creatinine, Ser 5.78 (*) 0.50 - 1.10 mg/dL DELTA CHECK NOTED   Calcium 9.1  8.4 - 10.5 mg/dL    GFR calc non Af Amer 34 (*) >90 mL/min    GFR calc Af Amer 40 (*) >90 mL/min   GLUCOSE, CAPILLARY     Status: Abnormal   Collection Time   05/28/12  7:35 AM      Component Value Range Comment   Glucose-Capillary 244 (*) 70 - 99 mg/dL   CREATININE, SERUM     Status: Abnormal   Collection Time   05/28/12 11:35 AM      Component Value Range Comment   Creatinine, Ser  1.02  0.50 - 1.10 mg/dL    GFR calc non Af Amer 50 (*) >90 mL/min    GFR calc Af Amer 58 (*) >90 mL/min   GLUCOSE, CAPILLARY     Status: Normal   Collection Time   05/28/12 11:53 AM      Component Value Range Comment   Glucose-Capillary 92  70 - 99 mg/dL   GLUCOSE, CAPILLARY     Status: Abnormal   Collection Time   05/28/12  5:23 PM      Component Value Range Comment   Glucose-Capillary 183 (*) 70 - 99 mg/dL     Dg Abd 2 Views  06/12/9146  *RADIOLOGY REPORT*  Clinical Data: 77 year old female with abdominal pain and distention.  Possible small bowel obstruction.  ABDOMEN - 2 VIEW  Comparison: 05/27/2012 and prior abdominal radiographs  Findings: Gas and fluid filled small bowel loops are again identified with the dilated loop in the left lower abdomen/pelvis now not visualized. A small amount of gas in the colon is noted. There is no evidence of pneumoperitoneum. Bibasilar atelectasis and degenerative changes in the lumbar spine and hips are again noted.  IMPRESSION: Nonspecific bowel gas pattern again identified without pneumoperitoneum. Apparent slight improvement of distended bowel loops in the left lower abdomen. Small bowel obstruction is not excluded given these findings.  Original Report Authenticated By: Rosendo Gros, M.D.   Dg Abd 2 Views  05/27/2012  *RADIOLOGY REPORT*  Clinical Data: Altered mental status.  Question free intraperitoneal air.  ABDOMEN - 2 VIEW  Comparison: Two views the abdomen 04/27/2012.  Findings: No free intraperitoneal air is identified.  There are gas filled and mildly dilated loops of small bowel measuring up to 3.4 cm in diameter with air-fluid levels present.  Lumbar scoliosis and degenerative change are noted.  Degenerative disease is also present about the hips, worse on the right.  IMPRESSION:  1.  Findings worrisome for small bowel obstruction. 2.  Negative for free intraperitoneal air.  Original Report Authenticated By: Bernadene Bell. Maricela Curet, M.D.    Review  of Systems  Constitutional: Negative for fever and chills.  HENT: Negative.   Eyes: Negative.   Respiratory: Negative.   Cardiovascular: Negative.   Gastrointestinal: Positive for nausea, vomiting and abdominal pain.  Genitourinary: Negative.   Musculoskeletal: Positive for joint pain.  Skin: Negative.   Neurological: Negative.   Endo/Heme/Allergies: Negative.   Psychiatric/Behavioral: Negative.    Blood pressure 128/53, pulse 84, temperature 98.8 F (37.1 C), temperature source Oral, resp. rate 17, height 5\' 7"  (1.702 m), weight 140 lb (63.504 kg), SpO2 99.00%. Physical Exam  Constitutional: She is oriented to person, place, and time. She appears well-developed and well-nourished.  HENT:  Head: Normocephalic and atraumatic.  Eyes: EOM are normal. Pupils are equal, round, and reactive to light.  Neck: Normal range of motion. Neck supple.  Cardiovascular: Normal rate and regular rhythm.  Respiratory: Effort normal and breath sounds normal.  GI: Soft. She exhibits distension. She exhibits no mass. There is no tenderness. There is no rebound and no guarding.  Neurological: She is alert and oriented to person, place, and time.  Skin: Skin is warm and dry.  Psychiatric: She has a normal mood and affect. Her behavior is normal. Judgment and thought content normal.    Assessment/Plan: Abdominal pain and distension.  Plain films not helpful.  CT with oral contrast to evaluate further.  Keep NPO.  NGT if she vomits.  Demesha Boorman A. 05/28/2012, 6:20 PM

## 2012-05-29 DIAGNOSIS — N179 Acute kidney failure, unspecified: Secondary | ICD-10-CM

## 2012-05-29 LAB — BASIC METABOLIC PANEL
BUN: 39 mg/dL — ABNORMAL HIGH (ref 6–23)
Calcium: 9.1 mg/dL (ref 8.4–10.5)
Chloride: 102 mEq/L (ref 96–112)
Creatinine, Ser: 0.85 mg/dL (ref 0.50–1.10)
GFR calc Af Amer: 72 mL/min — ABNORMAL LOW (ref >90)

## 2012-05-29 NOTE — Progress Notes (Signed)
[facility] PROGRESS NOTE  Megan [name] JXB:[id number] DOB: 1927/05/23 DOA: 05/27/2012 PCP: [name], MD  Assessment/Plan: Principal Problem:  *SBO (small bowel obstruction) Active Problems:  AKI (acute kidney injury)  DM (diabetes mellitus), type 2 with complications  Hypertension  Hypothyroidism  Nausea with vomiting  1. AKI - Improved on MIVF and due to prerenal causes.  - Creatinine is currently trending down with last creatinine 0.8  2 SBO  - Placed surgery consult and very much appreciate their input - Maintain patient NPO with MIVF at this time. - Will f/u with surgeons recommendations.  3. Hypertension  - Patient is currently off of her antihypertensive medication. Last blood pressure 131/47. Will continue to monitor and make changes pending blood pressure readings.   4. Nausea and Vomiting  - Likely caused by number 1.  - Continue antiemetic  - supportive therapy with IVF's at D5 1/2 NS with KCl.  -Did not place NG tube given her history of recent laparoscopic repair of peptic ulcer and deferred decision to surgery.  5. Hypothyroidism  - Continue home regimen. Stable   6. DM  - Once able to advance diet will place on diabetic diet  - CBG checks  - SSI   7) DVT prophylaxis  - Heparin for DVT prophylaxis   Code Status:Full  Family Communication: No family at bedside  Disposition Plan: Pending clinical improvement in condition and resolution of symptoms suspicious for SBO   Brief narrative:  Pt is an 78 y/o with recent h/o 5/13 of laparoscopic repair of peptic ulcer who presented to the ED c/o poor oral intake weakness.  Initial x ray's were suspicious for SBO and patient was kept NPO.  The following day in the late afternoon patient developed emesis and repeat abd. xrays were still suspicious for SBO as a result Surgery consult was placed.  Pt continues to be NPO.  Consultants:  none Procedures:  NG tube for today Antibiotics:   none   HPI/Subjective: Patient states that yesterday she had bout of emesis.  No new complaints today.  Denies any recent bowel movements, abdominal discomfort, fever, or chills.  Objective: Filed Vitals:   05/28/12 2100 05/29/12 0600 05/29/12 0848 05/29/12 1300  BP: 128/95 113/78 128/45 131/47  Pulse: 97 95 86 81  Temp: 98.8 F (37.1 C) 98.2 F (36.8 C) 97.5 F (36.4 C) 98.1 F (36.7 C)  TempSrc: Oral Oral Oral Oral  Resp: 18 18 17 18   Height:      Weight: 64.456 kg (142 lb 1.6 oz)     SpO2: 96% 97% 98% 100%    Intake/Output Summary (Last 24 hours) at 05/29/12 1608 Last data filed at 05/29/12 1300  Gross per 24 hour  Intake      0 ml  Output    250 ml  Net   -250 ml    Exam:  General: Pt in NAD, alert and awake  Cardiovascular: RRR, No MRG  Respiratory: CTA BL, no wheezes  Abdomen: Soft, NT, ND  Data Reviewed: Basic Metabolic Panel:  Lab 05/29/12 1308 05/28/12 1135 05/28/12 0510 05/27/12 1900 05/27/12 1151  NA 140 -- 140 -- 136  K 3.9 -- 3.4* -- 4.4  CL 102 -- 100 -- 92*  CO2 28 -- 30 -- 27  GLUCOSE 161* -- 255* -- 247*  BUN 39* -- 51* -- 53*  CREATININE 0.85 1.02 1.39* 2.17* 2.18*  CALCIUM 9.1 -- 9.1 -- 10.2  MG -- -- -- -- --  PHOS -- -- -- -- --  Liver Function Tests:  Lab 05/27/12 1151  AST 15  ALT 6  ALKPHOS 54  BILITOT 1.0  PROT 7.7  ALBUMIN 3.5    Lab 05/27/12 1151  LIPASE 12  AMYLASE --   No results found for this basename: AMMONIA:5 in the last 168 hours CBC:  Lab 05/28/12 0510 05/27/12 1900 05/27/12 1151  WBC 5.1 6.6 7.5  NEUTROABS -- -- 5.8  HGB 11.4* 12.3 12.8  HCT 35.1* 37.3 39.3  MCV 86.9 86.1 86.8  PLT 193 214 231   Cardiac Enzymes: No results found for this basename: CKTOTAL:5,CKMB:5,CKMBINDEX:5,TROPONINI:5 in the last 168 hours BNP (last 3 results) No results found for this basename: PROBNP:3 in the last 8760 hours CBG:  Lab 05/29/12 1142 05/29/12 0801 05/28/12 2113 05/28/12 1723 05/28/12 1153  GLUCAP 149* 138*  164* 183* 92    No results found for this or any previous visit (from the past 240 hour(s)).   Studies: Ct Abdomen Pelvis W Contrast  05/28/2012  *RADIOLOGY REPORT*  Clinical Data: Abdominal pain  CT ABDOMEN AND PELVIS WITH CONTRAST  Technique:  Multidetector CT imaging of the abdomen and pelvis was performed following the standard protocol during bolus administration of intravenous contrast.  Contrast: OMNIPAQUE IOHEXOL 300 MG/ML  SOLN  Comparison: 03/25/2012  Findings: Tiny bilateral pleural effusions.  Bibasilar atelectasis.  Cardiomegaly  Hiatal hernia  The stomach and several small bowel loops are distended.  Distal ileum is relatively decompressed.  The transition point is in the right lower quadrant without conspicuous cause.  Small amount of free fluid is seen in the right paracolic gutter. Free fluid is seen layering in the pelvis.  No loculated abscess.  Diverticulosis of the descending colon is noted.  There is noticeable stranding in the fat adjacent to the descending colon as well as in the omentum.  Peritoneal carcinomatosis has a similar appearance.  This may represent edema or inflammatory changes.  Benign appearing renal cysts.  Pancreatic atrophy.  Pancreatic and common bile duct are prominent but this might simply reflect bowel obstruction.  Duodenal diverticuli are present.  Nonspecific hypodensity at the dome of the liver is stable.  IMPRESSION: Small bowel obstruction pattern.  The transition point is in the distal ileum.  There is stranding within the fat adjacent to the descending colon and omentum.  This might reflect edema, inflammatory changes, or peritoneal carcinomatosis.  Colonic diverticulosis is associated.  Original Report Authenticated By: Donavan Burnet, M.D.   Dg Abd 2 Views  05/28/2012  *RADIOLOGY REPORT*  Clinical Data: 78 year old female with abdominal pain and distention.  Possible small bowel obstruction.  ABDOMEN - 2 VIEW  Comparison: 05/27/2012 and prior  abdominal radiographs  Findings: Gas and fluid filled small bowel loops are again identified with the dilated loop in the left lower abdomen/pelvis now not visualized. A small amount of gas in the colon is noted. There is no evidence of pneumoperitoneum. Bibasilar atelectasis and degenerative changes in the lumbar spine and hips are again noted.  IMPRESSION: Nonspecific bowel gas pattern again identified without pneumoperitoneum. Apparent slight improvement of distended bowel loops in the left lower abdomen. Small bowel obstruction is not excluded given these findings.  Original Report Authenticated By: Rosendo Gros, M.D.   Dg Abd 2 Views  05/27/2012  *RADIOLOGY REPORT*  Clinical Data: Altered mental status.  Question free intraperitoneal air.  ABDOMEN - 2 VIEW  Comparison: Two views the abdomen 04/27/2012.  Findings: No free intraperitoneal air is identified.  There are gas filled and mildly dilated loops of small bowel measuring up to 3.4 cm in diameter with air-fluid levels present.  Lumbar scoliosis and degenerative change are noted.  Degenerative disease is also present about the hips, worse on the right.  IMPRESSION:  1.  Findings worrisome for small bowel obstruction. 2.  Negative for free intraperitoneal air.  Original Report Authenticated By: Bernadene Bell. D'ALESSIO, M.D.    Scheduled Meds:   . donepezil  5 mg Oral Daily  . heparin  5,000 Units Subcutaneous Q8H  . insulin aspart  0-15 Units Subcutaneous TID WC  . levothyroxine  50 mcg Oral Daily  . pantoprazole  40 mg Oral Daily  . Travoprost (BAK Free)  1 drop Both Eyes QHS   Continuous Infusions:   . dextrose 5 % and 0.45 % NaCl with KCl 20 mEq/L 100 mL/hr at 05/29/12 0825    Principal Problem:  *SBO (small bowel obstruction) Active Problems:  AKI (acute kidney injury)  DM (diabetes mellitus), type 2 with complications  Hypertension  Hypothyroidism  Nausea with vomiting    Time spent: > 35 minutes    Penny Pia  [facility] Pager [phone]. If 8PM-8AM, please contact night-coverage at [email or web address], password [facility] 05/29/2012, 4:08 PM  LOS: 2 days

## 2012-05-29 NOTE — Progress Notes (Signed)
Subjective: Reports mild cramping abdominal pain +flatus No further emesis  Objective: Vital signs in last 24 hours: Temp:  [97.5 F (36.4 C)-98.8 F (37.1 C)] 97.5 F (36.4 C) (07/28 0848) Pulse Rate:  [76-97] 86  (07/28 0848) Resp:  [17-18] 17  (07/28 0848) BP: (111-138)/(45-95) 128/45 mmHg (07/28 0848) SpO2:  [96 %-100 %] 98 % (07/28 0848) Weight:  [142 lb 1.6 oz (64.456 kg)] 142 lb 1.6 oz (64.456 kg) (07/27 2100)    Intake/Output from previous day: 07/27 0701 - 07/28 0700 In: 935 [P.O.:60; I.V.:875] Out: 525 [Urine:500; Emesis/NG output:25] Intake/Output this shift:    Comfortable in appearance Abdomen soft, mildly tender right side, +BS  Lab Results:   Basename 05/28/12 0510 05/27/12 1900  WBC 5.1 6.6  HGB 11.4* 12.3  HCT 35.1* 37.3  PLT 193 214   BMET  Basename 05/29/12 0505 05/28/12 1135 05/28/12 0510  NA 140 -- 140  K 3.9 -- 3.4*  CL 102 -- 100  CO2 28 -- 30  GLUCOSE 161* -- 255*  BUN 39* -- 51*  CREATININE 0.85 1.02 --  CALCIUM 9.1 -- 9.1   PT/INR No results found for this basename: LABPROT:2,INR:2 in the last 72 hours ABG No results found for this basename: PHART:2,PCO2:2,PO2:2,HCO3:2 in the last 72 hours  Studies/Results: Ct Abdomen Pelvis W Contrast  05/28/2012  *RADIOLOGY REPORT*  Clinical Data: Abdominal pain  CT ABDOMEN AND PELVIS WITH CONTRAST  Technique:  Multidetector CT imaging of the abdomen and pelvis was performed following the standard protocol during bolus administration of intravenous contrast.  Contrast: OMNIPAQUE IOHEXOL 300 MG/ML  SOLN  Comparison: 03/25/2012  Findings: Tiny bilateral pleural effusions.  Bibasilar atelectasis.  Cardiomegaly  Hiatal hernia  The stomach and several small bowel loops are distended.  Distal ileum is relatively decompressed.  The transition point is in the right lower quadrant without conspicuous cause.  Small amount of free fluid is seen in the right paracolic gutter. Free fluid is seen layering  in the pelvis.  No loculated abscess.  Diverticulosis of the descending colon is noted.  There is noticeable stranding in the fat adjacent to the descending colon as well as in the omentum.  Peritoneal carcinomatosis has a similar appearance.  This may represent edema or inflammatory changes.  Benign appearing renal cysts.  Pancreatic atrophy.  Pancreatic and common bile duct are prominent but this might simply reflect bowel obstruction.  Duodenal diverticuli are present.  Nonspecific hypodensity at the dome of the liver is stable.  IMPRESSION: Small bowel obstruction pattern.  The transition point is in the distal ileum.  There is stranding within the fat adjacent to the descending colon and omentum.  This might reflect edema, inflammatory changes, or peritoneal carcinomatosis.  Colonic diverticulosis is associated.  Original Report Authenticated By: Donavan Burnet, M.D.   Dg Abd 2 Views  05/28/2012  *RADIOLOGY REPORT*  Clinical Data: 78 year old female with abdominal pain and distention.  Possible small bowel obstruction.  ABDOMEN - 2 VIEW  Comparison: 05/27/2012 and prior abdominal radiographs  Findings: Gas and fluid filled small bowel loops are again identified with the dilated loop in the left lower abdomen/pelvis now not visualized. A small amount of gas in the colon is noted. There is no evidence of pneumoperitoneum. Bibasilar atelectasis and degenerative changes in the lumbar spine and hips are again noted.  IMPRESSION: Nonspecific bowel gas pattern again identified without pneumoperitoneum. Apparent slight improvement of distended bowel loops in the left lower abdomen. Small  bowel obstruction is not excluded given these findings.  Original Report Authenticated By: Rosendo Gros, M.D.   Dg Abd 2 Views  05/27/2012  *RADIOLOGY REPORT*  Clinical Data: Altered mental status.  Question free intraperitoneal air.  ABDOMEN - 2 VIEW  Comparison: Two views the abdomen 04/27/2012.  Findings: No free  intraperitoneal air is identified.  There are gas filled and mildly dilated loops of small bowel measuring up to 3.4 cm in diameter with air-fluid levels present.  Lumbar scoliosis and degenerative change are noted.  Degenerative disease is also present about the hips, worse on the right.  IMPRESSION:  1.  Findings worrisome for small bowel obstruction. 2.  Negative for free intraperitoneal air.  Original Report Authenticated By: Bernadene Bell. Maricela Curet, M.D.    Anti-infectives: Anti-infectives    None      Assessment/Plan: s/p * No surgery found *   Partial SBO  Continue bowel rest. Repeat xrays in am  LOS: 2 days    Megan [name] A 05/29/2012

## 2012-05-30 ENCOUNTER — Inpatient Hospital Stay (HOSPITAL_COMMUNITY): Payer: Medicare Other

## 2012-05-30 LAB — GLUCOSE, CAPILLARY: Glucose-Capillary: 134 mg/dL — ABNORMAL HIGH (ref 70–99)

## 2012-05-30 MED ORDER — BIOTENE DRY MOUTH MT LIQD
15.0000 mL | Freq: Two times a day (BID) | OROMUCOSAL | Status: DC
  Administered 2012-05-31 – 2012-06-10 (×20): 15 mL via OROMUCOSAL

## 2012-05-30 MED ORDER — CHLORHEXIDINE GLUCONATE 0.12 % MT SOLN
15.0000 mL | Freq: Two times a day (BID) | OROMUCOSAL | Status: DC
  Administered 2012-05-30 – 2012-06-11 (×20): 15 mL via OROMUCOSAL
  Filled 2012-05-30 (×28): qty 15

## 2012-05-30 NOTE — Progress Notes (Signed)
[facility] PROGRESS NOTE  Megan [name] ZOX:[id number] DOB: [birth year] DOA: 05/27/2012 PCP: [name], MD  Assessment/Plan: Principal Problem:  *SBO (small bowel obstruction) Active Problems:  AKI (acute kidney injury)  DM (diabetes mellitus), type 2 with complications  Hypertension  Hypothyroidism  Nausea with vomiting  1. AKI - Improved on MIVF and due to prerenal causes.  - Creatinine is currently trending down with last creatinine 0.8  - Will recheck level tomorrow.  2 SBO  - Placed surgery consult and very much appreciate their input  - Maintain patient NPO with MIVF at this time.  - Will f/u with surgeons recommendations. - F/u abd x ray still showing obstruction pattern. Have discussed with patient and family.   3. Hypertension  - Patient is currently off of her antihypertensive medication.  Will continue to monitor and make changes pending blood pressure readings.   4. Nausea and Vomiting  - Likely caused by number 1.  - Continue antiemetic  - supportive therapy with IVF's at D5 1/2 NS with KCl.  -Did not place NG tube given her history of recent laparoscopic repair of peptic ulcer and deferred decision to surgery.   5. Hypothyroidism  - Continue home regimen. Stable   6. DM  - Once able to advance diet will place on diabetic diet  - CBG checks  - SSI   7) DVT prophylaxis  - Heparin for DVT prophylaxis   Code Status:Full  Family Communication: Spoke at length with patient and daughter and answered their questions to their satisfaction.  Disposition Plan: Pending clinical improvement in condition and resolution of symptoms suspicious for SBO   Brief narrative:  Pt is an 78 y/o with recent h/o 5/13 of laparoscopic repair of peptic ulcer who presented to the ED c/o poor oral intake weakness. Initial x ray's were suspicious for SBO and patient was kept NPO. The following day in the late afternoon patient developed emesis and repeat abd. xrays  were still suspicious for SBO as a result Surgery consult was placed. Pt continues to be NPO.   Consultants:  General Surgery Procedures:  none Antibiotics:  none    HPI/Subjective: Pt denies any abdominal discomfort.  No fever, chills, HA's, BM's.  No acute issues reported overnight  Objective: Filed Vitals:   05/29/12 2119 05/30/12 0515 05/30/12 0949 05/30/12 1401  BP: 152/69 120/88 149/78 145/81  Pulse: 86 84 85 85  Temp: 98.4 F (36.9 C) 97.3 F (36.3 C) 98.1 F (36.7 C) 99.2 F (37.3 C)  TempSrc: Oral Oral Oral Oral  Resp: 16 18 18 20   Height:      Weight: 65.7 kg (144 lb 13.5 oz)     SpO2: 97% 99% 94% 100%    Intake/Output Summary (Last 24 hours) at 05/30/12 1542 Last data filed at 05/29/12 1700  Gross per 24 hour  Intake   1200 ml  Output      0 ml  Net   1200 ml    Exam:  General: Pt in NAD, alert and awake  Cardiovascular: RRR, No MRG  Respiratory: CTA BL, no wheezes  Abdomen: Soft, NT, ND  Data Reviewed: Basic Metabolic Panel:  Lab 05/29/12 8119 05/28/12 1135 05/28/12 0510 05/27/12 1900 05/27/12 1151  NA 140 -- 140 -- 136  K 3.9 -- 3.4* -- 4.4  CL 102 -- 100 -- 92*  CO2 28 -- 30 -- 27  GLUCOSE 161* -- 255* -- 247*  BUN 39* -- 51* -- 53*  CREATININE 0.85  1.02 1.39* 2.17* 2.18*  CALCIUM 9.1 -- 9.1 -- 10.2  MG -- -- -- -- --  PHOS -- -- -- -- --   Liver Function Tests:  Lab 05/27/12 1151  AST 15  ALT 6  ALKPHOS 54  BILITOT 1.0  PROT 7.7  ALBUMIN 3.5    Lab 05/27/12 1151  LIPASE 12  AMYLASE --   No results found for this basename: AMMONIA:5 in the last 168 hours CBC:  Lab 05/28/12 0510 05/27/12 1900 05/27/12 1151  WBC 5.1 6.6 7.5  NEUTROABS -- -- 5.8  HGB 11.4* 12.3 12.8  HCT 35.1* 37.3 39.3  MCV 86.9 86.1 86.8  PLT 193 214 231   Cardiac Enzymes: No results found for this basename: CKTOTAL:5,CKMB:5,CKMBINDEX:5,TROPONINI:5 in the last 168 hours BNP (last 3 results) No results found for this basename: PROBNP:3 in the last  8760 hours CBG:  Lab 05/30/12 1137 05/30/12 0819 05/29/12 2123 05/29/12 1645 05/29/12 1142  GLUCAP 128* 197* 103* 179* 149*    No results found for this or any previous visit (from the past 240 hour(s)).   Studies: Ct Abdomen Pelvis W Contrast  05/28/2012  *RADIOLOGY REPORT*  Clinical Data: Abdominal pain  CT ABDOMEN AND PELVIS WITH CONTRAST  Technique:  Multidetector CT imaging of the abdomen and pelvis was performed following the standard protocol during bolus administration of intravenous contrast.  Contrast: OMNIPAQUE IOHEXOL 300 MG/ML  SOLN  Comparison: 03/25/2012  Findings: Tiny bilateral pleural effusions.  Bibasilar atelectasis.  Cardiomegaly  Hiatal hernia  The stomach and several small bowel loops are distended.  Distal ileum is relatively decompressed.  The transition point is in the right lower quadrant without conspicuous cause.  Small amount of free fluid is seen in the right paracolic gutter. Free fluid is seen layering in the pelvis.  No loculated abscess.  Diverticulosis of the descending colon is noted.  There is noticeable stranding in the fat adjacent to the descending colon as well as in the omentum.  Peritoneal carcinomatosis has a similar appearance.  This may represent edema or inflammatory changes.  Benign appearing renal cysts.  Pancreatic atrophy.  Pancreatic and common bile duct are prominent but this might simply reflect bowel obstruction.  Duodenal diverticuli are present.  Nonspecific hypodensity at the dome of the liver is stable.  IMPRESSION: Small bowel obstruction pattern.  The transition point is in the distal ileum.  There is stranding within the fat adjacent to the descending colon and omentum.  This might reflect edema, inflammatory changes, or peritoneal carcinomatosis.  Colonic diverticulosis is associated.  Original Report Authenticated By: Donavan Burnet, M.D.   Dg Abd 2 Views  05/30/2012  *RADIOLOGY REPORT*  Clinical Data: Abdominal discomfort.   Distention.  ABDOMEN - 2 VIEW  Comparison: CT abdomen pelvis 05/28/2012 and abdominal radiograph 05/28/2012.  Findings: There is a relative paucity of gas in the abdomen. Dilated small bowel loops and air-fluid levels are seen in the right upper quadrant.  Curvilinear calcification in the left abdomen corresponds to the wall of a low attenuation lesion off the lower pole left kidney, better seen on 05/28/2012.  Degenerative changes are seen in the spine, sacroiliac joints, hips and symphysis pubis.  Incidental imaging of the chest shows a layering left pleural effusion.  IMPRESSION:  Relative paucity of gas in the abdomen with mildly dilated loops of small bowel in the right upper quadrant and associated air fluid levels, indicating a persistent obstructive pattern.  Original Report Authenticated By: Joanna Hews.  Fredirick Lathe, M.D.   Dg Abd 2 Views  05/28/2012  *RADIOLOGY REPORT*  Clinical Data: 78 year old female with abdominal pain and distention.  Possible small bowel obstruction.  ABDOMEN - 2 VIEW  Comparison: 05/27/2012 and prior abdominal radiographs  Findings: Gas and fluid filled small bowel loops are again identified with the dilated loop in the left lower abdomen/pelvis now not visualized. A small amount of gas in the colon is noted. There is no evidence of pneumoperitoneum. Bibasilar atelectasis and degenerative changes in the lumbar spine and hips are again noted.  IMPRESSION: Nonspecific bowel gas pattern again identified without pneumoperitoneum. Apparent slight improvement of distended bowel loops in the left lower abdomen. Small bowel obstruction is not excluded given these findings.  Original Report Authenticated By: Rosendo Gros, M.D.   Dg Abd 2 Views  05/27/2012  *RADIOLOGY REPORT*  Clinical Data: Altered mental status.  Question free intraperitoneal air.  ABDOMEN - 2 VIEW  Comparison: Two views the abdomen 04/27/2012.  Findings: No free intraperitoneal air is identified.  There are gas filled and  mildly dilated loops of small bowel measuring up to 3.4 cm in diameter with air-fluid levels present.  Lumbar scoliosis and degenerative change are noted.  Degenerative disease is also present about the hips, worse on the right.  IMPRESSION:  1.  Findings worrisome for small bowel obstruction. 2.  Negative for free intraperitoneal air.  Original Report Authenticated By: Bernadene Bell. D'ALESSIO, M.D.    Scheduled Meds:   . antiseptic oral rinse  15 mL Mouth Rinse q12n4p  . chlorhexidine  15 mL Mouth Rinse BID  . donepezil  5 mg Oral Daily  . heparin  5,000 Units Subcutaneous Q8H  . insulin aspart  0-15 Units Subcutaneous TID WC  . levothyroxine  50 mcg Oral Daily  . pantoprazole  40 mg Oral Daily  . Travoprost (BAK Free)  1 drop Both Eyes QHS   Continuous Infusions:   . dextrose 5 % and 0.45 % NaCl with KCl 20 mEq/L 100 mL/hr (05/30/12 0909)    Principal Problem:  *SBO (small bowel obstruction) Active Problems:  AKI (acute kidney injury)  DM (diabetes mellitus), type 2 with complications  Hypertension  Hypothyroidism  Nausea with vomiting    Time spent: > 35 minutes discussing current clinical scenario with patient and family (daughter) spent > 15 minutes answering questions and discussing findings.    Penny Pia  [facility] Pager [phone]. If 8PM-8AM, please contact night-coverage at [email or web address], password [facility] 05/30/2012, 3:42 PM  LOS: 3 days

## 2012-05-30 NOTE — Progress Notes (Signed)
No significant pain or vomiting but does not feel well Abd soft but bloated KUB not that helpful as bowel is fluid filled, but still dilated loop RUQ Appears to have some ongoing obstruction.  Continue close observation  Mariella Saa MD, FACS  05/30/2012, 9:50 AM

## 2012-05-30 NOTE — Progress Notes (Signed)
Patient ID: Megan [name], female   DOB: 1927-03-10, 78 y.o.   MRN: [id number]    Subjective: No reports of  cramping abdominal painm, states that she is [place]. +flatus No further emesis  Objective: Vital signs in last 24 hours: Temp:  [97.3 F (36.3 C)-98.5 F (36.9 C)] 97.3 F (36.3 C) (07/29 0515) Pulse Rate:  [81-88] 84  (07/29 0515) Resp:  [16-18] 18  (07/29 0515) BP: (120-152)/(45-88) 120/88 mmHg (07/29 0515) SpO2:  [97 %-100 %] 99 % (07/29 0515) Weight:  [144 lb 13.5 oz (65.7 kg)] 144 lb 13.5 oz (65.7 kg) (07/28 2119) Last BM Date: 05/28/12  Intake/Output from previous day: 07/28 0701 - 07/29 0700 In: 1200 [I.V.:1200] Out: 225 [Urine:225] Intake/Output this shift:    Comfortable in appearance Abdomen soft, still mildly tender right side,  Minimal BS  Lab Results:   Basename 05/28/12 0510 05/27/12 1900  WBC 5.1 6.6  HGB 11.4* 12.3  HCT 35.1* 37.3  PLT 193 214   BMET  Basename 05/29/12 0505 05/28/12 1135 05/28/12 0510  NA 140 -- 140  K 3.9 -- 3.4*  CL 102 -- 100  CO2 28 -- 30  GLUCOSE 161* -- 255*  BUN 39* -- 51*  CREATININE 0.85 1.02 --  CALCIUM 9.1 -- 9.1   PT/INR No results found for this basename: LABPROT:2,INR:2 in the last 72 hours ABG No results found for this basename: PHART:2,PCO2:2,PO2:2,HCO3:2 in the last 72 hours  Studies/Results: Ct Abdomen Pelvis W Contrast  05/28/2012  *RADIOLOGY REPORT*  Clinical Data: Abdominal pain  CT ABDOMEN AND PELVIS WITH CONTRAST  Technique:  Multidetector CT imaging of the abdomen and pelvis was performed following the standard protocol during bolus administration of intravenous contrast.  Contrast: OMNIPAQUE IOHEXOL 300 MG/ML  SOLN  Comparison: 03/25/2012  Findings: Tiny bilateral pleural effusions.  Bibasilar atelectasis.  Cardiomegaly  Hiatal hernia  The stomach and several small bowel loops are distended.  Distal ileum is relatively decompressed.  The transition point is in the right lower quadrant  without conspicuous cause.  Small amount of free fluid is seen in the right paracolic gutter. Free fluid is seen layering in the pelvis.  No loculated abscess.  Diverticulosis of the descending colon is noted.  There is noticeable stranding in the fat adjacent to the descending colon as well as in the omentum.  Peritoneal carcinomatosis has a similar appearance.  This may represent edema or inflammatory changes.  Benign appearing renal cysts.  Pancreatic atrophy.  Pancreatic and common bile duct are prominent but this might simply reflect bowel obstruction.  Duodenal diverticuli are present.  Nonspecific hypodensity at the dome of the liver is stable.  IMPRESSION: Small bowel obstruction pattern.  The transition point is in the distal ileum.  There is stranding within the fat adjacent to the descending colon and omentum.  This might reflect edema, inflammatory changes, or peritoneal carcinomatosis.  Colonic diverticulosis is associated.  Original Report Authenticated By: Donavan Burnet, M.D.   Dg Abd 2 Views  05/30/2012  *RADIOLOGY REPORT*  Clinical Data: Abdominal discomfort.  Distention.  ABDOMEN - 2 VIEW  Comparison: CT abdomen pelvis 05/28/2012 and abdominal radiograph 05/28/2012.  Findings: There is a relative paucity of gas in the abdomen. Dilated small bowel loops and air-fluid levels are seen in the right upper quadrant.  Curvilinear calcification in the left abdomen corresponds to the wall of a low attenuation lesion off the lower pole left kidney, better seen on 05/28/2012.  Degenerative changes are  seen in the spine, sacroiliac joints, hips and symphysis pubis.  Incidental imaging of the chest shows a layering left pleural effusion.  IMPRESSION:  Relative paucity of gas in the abdomen with mildly dilated loops of small bowel in the right upper quadrant and associated air fluid levels, indicating a persistent obstructive pattern.  Original Report Authenticated By: Reyes Ivan, M.D.   Dg Abd 2  Views  05/28/2012  *RADIOLOGY REPORT*  Clinical Data: 78 year old female with abdominal pain and distention.  Possible small bowel obstruction.  ABDOMEN - 2 VIEW  Comparison: 05/27/2012 and prior abdominal radiographs  Findings: Gas and fluid filled small bowel loops are again identified with the dilated loop in the left lower abdomen/pelvis now not visualized. A small amount of gas in the colon is noted. There is no evidence of pneumoperitoneum. Bibasilar atelectasis and degenerative changes in the lumbar spine and hips are again noted.  IMPRESSION: Nonspecific bowel gas pattern again identified without pneumoperitoneum. Apparent slight improvement of distended bowel loops in the left lower abdomen. Small bowel obstruction is not excluded given these findings.  Original Report Authenticated By: Rosendo Gros, M.D.    Anti-infectives: Anti-infectives    None      Assessment/Plan: s/p * No surgery found *   Partial SBO  Continue bowel rest. Abd X-rays have been done, results pending.  LOS: 3 days    Tori Dattilio 05/30/2012

## 2012-05-31 ENCOUNTER — Inpatient Hospital Stay (HOSPITAL_COMMUNITY): Payer: Medicare Other

## 2012-05-31 LAB — CBC
Hemoglobin: 11.9 g/dL — ABNORMAL LOW (ref 12.0–15.0)
MCH: 27.9 pg (ref 26.0–34.0)
MCHC: 31.9 g/dL (ref 30.0–36.0)

## 2012-05-31 LAB — BASIC METABOLIC PANEL
CO2: 26 mEq/L (ref 19–32)
Chloride: 103 mEq/L (ref 96–112)
GFR calc non Af Amer: 81 mL/min — ABNORMAL LOW (ref >90)
Glucose, Bld: 188 mg/dL — ABNORMAL HIGH (ref 70–99)
Potassium: 4.1 mEq/L (ref 3.5–5.1)
Sodium: 135 mEq/L (ref 135–145)

## 2012-05-31 LAB — GLUCOSE, CAPILLARY
Glucose-Capillary: 163 mg/dL — ABNORMAL HIGH (ref 70–99)
Glucose-Capillary: 80 mg/dL (ref 70–99)
Glucose-Capillary: 135 mg/dL — ABNORMAL HIGH (ref 70–99)

## 2012-05-31 MED ORDER — PANTOPRAZOLE SODIUM 40 MG IV SOLR
40.0000 mg | INTRAVENOUS | Status: DC
  Administered 2012-05-31 – 2012-06-09 (×10): 40 mg via INTRAVENOUS
  Filled 2012-05-31 (×10): qty 40

## 2012-05-31 NOTE — Progress Notes (Signed)
She has bilious vomiting today.  Denies abdominal pain.  Abdomen remains NT. Agree with PA note. Will place NG.  Discussed laparotomy tomorrow unless signs of improvement  Mariella Saa MD, FACS  05/31/2012, 1:45 PM

## 2012-05-31 NOTE — Progress Notes (Signed)
[facility] PROGRESS NOTE  Megan [name] AOZ:[id number] DOB: 07/14/1927 DOA: 05/27/2012 PCP: Megan Corwin, MD  Assessment/Plan: Principal Problem:  *SBO (small bowel obstruction) Active Problems:  AKI (acute kidney injury)  DM (diabetes mellitus), type 2 with complications  Hypertension  Hypothyroidism  Nausea with vomiting 1. AKI - Improved on MIVF and due to prerenal causes.  - Creatinine is currently trending down with last creatinine 0.63 and back at baseline  2 SBO  - Placed surgery consult and very much appreciate their input  - Maintain patient NPO with MIVF at this time.  - Will f/u with surgeons recommendations and they still recommending bowel rest.  - F/u abd x ray on 7/30 still showing obstruction pattern. Have discussed with patient.   3. Hypertension  - Patient is currently off of her antihypertensive medication. Will continue to monitor and make changes pending blood pressure readings.   4. Nausea and Vomiting  - Likely caused by number 2.  - Continue antiemetic  - supportive therapy with IVF's at D5 1/2 NS with KCl.  -Did not place NG tube given her history of recent laparoscopic repair of peptic ulcer and deferred decision to surgery.   5. Hypothyroidism  - Continue home regimen. Stable   6. DM  - Once able to advance diet will place on diabetic diet  - CBG checks  - SSI   7) DVT prophylaxis  - Heparin for DVT prophylaxis   Code Status:Full  Family Communication: Spoke at length with patient and daughter and answered their questions to their satisfaction.  Disposition Plan: Pending clinical improvement in condition and resolution of symptoms consistent with SBO    HPI/Subjective: Patient mentions that she has been vomiting today.  Had some vomiting overnight.  Xray was subsequently ordered which still shows obstructive pattern. Patient denies any flatus or BM's  Objective: Filed Vitals:   05/30/12 1758 05/30/12 2131 05/31/12 0528  05/31/12 0949  BP: 156/85 127/52 94/77 136/52  Pulse: 85 90 89 81  Temp: 99 F (37.2 C) 99.2 F (37.3 C) 98.4 F (36.9 C) 98.8 F (37.1 C)  TempSrc: Oral Oral Oral Oral  Resp: 19 18 17 18   Height:      Weight:  69.9 kg (154 lb 1.6 oz)    SpO2: 99% 100% 97% 99%    Intake/Output Summary (Last 24 hours) at 05/31/12 1107 Last data filed at 05/31/12 0520  Gross per 24 hour  Intake   1000 ml  Output      0 ml  Net   1000 ml    Exam:  General: Pt in NAD, alert and awake  Cardiovascular: RRR, No MRG  Respiratory: CTA BL, no wheezes  Abdomen: Soft, NT, more distended today  Data Reviewed: Basic Metabolic Panel:  Lab 05/31/12 9629 05/29/12 0505 05/28/12 1135 05/28/12 0510 05/27/12 1900 05/27/12 1151  NA 135 140 -- 140 -- 136  K 4.1 3.9 -- 3.4* -- 4.4  CL 103 102 -- 100 -- 92*  CO2 26 28 -- 30 -- 27  GLUCOSE 188* 161* -- 255* -- 247*  BUN 20 39* -- 51* -- 53*  CREATININE 0.63 0.85 1.02 1.39* 2.17* --  CALCIUM 8.5 9.1 -- 9.1 -- 10.2  MG -- -- -- -- -- --  PHOS -- -- -- -- -- --   Liver Function Tests:  Lab 05/27/12 1151  AST 15  ALT 6  ALKPHOS 54  BILITOT 1.0  PROT 7.7  ALBUMIN 3.5    Lab  05/27/12 1151  LIPASE 12  AMYLASE --   No results found for this basename: AMMONIA:5 in the last 168 hours CBC:  Lab 05/31/12 1031 05/28/12 0510 05/27/12 1900 05/27/12 1151  WBC 6.4 5.1 6.6 7.5  NEUTROABS -- -- -- 5.8  HGB 11.9* 11.4* 12.3 12.8  HCT 37.3 35.1* 37.3 39.3  MCV 87.4 86.9 86.1 86.8  PLT 195 193 214 231   Cardiac Enzymes: No results found for this basename: CKTOTAL:5,CKMB:5,CKMBINDEX:5,TROPONINI:5 in the last 168 hours BNP (last 3 results) No results found for this basename: PROBNP:3 in the last 8760 hours CBG:  Lab 05/31/12 0748 05/30/12 2130 05/30/12 1707 05/30/12 1137 05/30/12 0819  GLUCAP 163* 134* 153* 128* 197*    No results found for this or any previous visit (from the past 240 hour(s)).   Studies: Ct Abdomen Pelvis W Contrast  05/28/2012   *RADIOLOGY REPORT*  Clinical Data: Abdominal pain  CT ABDOMEN AND PELVIS WITH CONTRAST  Technique:  Multidetector CT imaging of the abdomen and pelvis was performed following the standard protocol during bolus administration of intravenous contrast.  Contrast: OMNIPAQUE IOHEXOL 300 MG/ML  SOLN  Comparison: 03/25/2012  Findings: Tiny bilateral pleural effusions.  Bibasilar atelectasis.  Cardiomegaly  Hiatal hernia  The stomach and several small bowel loops are distended.  Distal ileum is relatively decompressed.  The transition point is in the right lower quadrant without conspicuous cause.  Small amount of free fluid is seen in the right paracolic gutter. Free fluid is seen layering in the pelvis.  No loculated abscess.  Diverticulosis of the descending colon is noted.  There is noticeable stranding in the fat adjacent to the descending colon as well as in the omentum.  Peritoneal carcinomatosis has a similar appearance.  This may represent edema or inflammatory changes.  Benign appearing renal cysts.  Pancreatic atrophy.  Pancreatic and common bile duct are prominent but this might simply reflect bowel obstruction.  Duodenal diverticuli are present.  Nonspecific hypodensity at the dome of the liver is stable.  IMPRESSION: Small bowel obstruction pattern.  The transition point is in the distal ileum.  There is stranding within the fat adjacent to the descending colon and omentum.  This might reflect edema, inflammatory changes, or peritoneal carcinomatosis.  Colonic diverticulosis is associated.  Original Report Authenticated By: Donavan Burnet, M.D.   Dg Abd 2 Views  05/30/2012  *RADIOLOGY REPORT*  Clinical Data: Abdominal discomfort.  Distention.  ABDOMEN - 2 VIEW  Comparison: CT abdomen pelvis 05/28/2012 and abdominal radiograph 05/28/2012.  Findings: There is a relative paucity of gas in the abdomen. Dilated small bowel loops and air-fluid levels are seen in the right upper quadrant.  Curvilinear  calcification in the left abdomen corresponds to the wall of a low attenuation lesion off the lower pole left kidney, better seen on 05/28/2012.  Degenerative changes are seen in the spine, sacroiliac joints, hips and symphysis pubis.  Incidental imaging of the chest shows a layering left pleural effusion.  IMPRESSION:  Relative paucity of gas in the abdomen with mildly dilated loops of small bowel in the right upper quadrant and associated air fluid levels, indicating a persistent obstructive pattern.  Original Report Authenticated By: Reyes Ivan, M.D.   Dg Abd 2 Views  05/28/2012  *RADIOLOGY REPORT*  Clinical Data: 78 year old female with abdominal pain and distention.  Possible small bowel obstruction.  ABDOMEN - 2 VIEW  Comparison: 05/27/2012 and prior abdominal radiographs  Findings: Gas and fluid filled  small bowel loops are again identified with the dilated loop in the left lower abdomen/pelvis now not visualized. A small amount of gas in the colon is noted. There is no evidence of pneumoperitoneum. Bibasilar atelectasis and degenerative changes in the lumbar spine and hips are again noted.  IMPRESSION: Nonspecific bowel gas pattern again identified without pneumoperitoneum. Apparent slight improvement of distended bowel loops in the left lower abdomen. Small bowel obstruction is not excluded given these findings.  Original Report Authenticated By: Rosendo Gros, M.D.   Dg Abd 2 Views  05/27/2012  *RADIOLOGY REPORT*  Clinical Data: Altered mental status.  Question free intraperitoneal air.  ABDOMEN - 2 VIEW  Comparison: Two views the abdomen 04/27/2012.  Findings: No free intraperitoneal air is identified.  There are gas filled and mildly dilated loops of small bowel measuring up to 3.4 cm in diameter with air-fluid levels present.  Lumbar scoliosis and degenerative change are noted.  Degenerative disease is also present about the hips, worse on the right.  IMPRESSION:  1.  Findings worrisome for  small bowel obstruction. 2.  Negative for free intraperitoneal air.  Original Report Authenticated By: Bernadene Bell. Maricela Curet, M.D.   Dg Abd Portable 2v  05/31/2012  *RADIOLOGY REPORT*  Clinical Data: Vomiting, possible small bowel obstruction.  PORTABLE ABDOMEN - 2 VIEW  Comparison: 05/30/2012  Findings: No interval change in bowel gas pattern.  Relative paucity of small bowel gas in keeping with known fluid filled loops.  Air within mildly dilated bowel loops of the right upper quadrant, similar to prior. The lung bases/hemidiaphragms are essentially excluded from image.  Degenerative changes of the lumbar spine and SI joints.  IMPRESSION: No significant change in the fluid-filled small bowel obstruction pattern.  Original Report Authenticated By: Waneta Martins, M.D.    Scheduled Meds:   . antiseptic oral rinse  15 mL Mouth Rinse q12n4p  . chlorhexidine  15 mL Mouth Rinse BID  . donepezil  5 mg Oral Daily  . heparin  5,000 Units Subcutaneous Q8H  . insulin aspart  0-15 Units Subcutaneous TID WC  . levothyroxine  50 mcg Oral Daily  . pantoprazole (PROTONIX) IV  40 mg Intravenous Q24H  . Travoprost (BAK Free)  1 drop Both Eyes QHS  . DISCONTD: pantoprazole  40 mg Oral Daily   Continuous Infusions:   . dextrose 5 % and 0.45 % NaCl with KCl 20 mEq/L 100 mL/hr at 05/31/12 0520    Principal Problem:  *SBO (small bowel obstruction) Active Problems:  AKI (acute kidney injury)  DM (diabetes mellitus), type 2 with complications  Hypertension  Hypothyroidism  Nausea with vomiting    Time spent: > 35 minutes    Penny Pia  [facility] Pager [phone]. If 8PM-8AM, please contact night-coverage at [email or web address], password [facility] 05/31/2012, 11:07 AM  LOS: 4 days

## 2012-05-31 NOTE — Progress Notes (Signed)
Patient ID: Megan [name], female   DOB: 1927-03-26, 78 y.o.   MRN: [id number] Patient ID: Megan [name], female   DOB: [birth year], 78 y.o.   MRN: [id number]    Subjective: No reports of cramping abdominal pain, states that she is [place]. +flatus Has had 2 episodes of emesis this morning.  Objective: Vital signs in last 24 hours: Temp:  [98.4 F (36.9 C)-99.2 F (37.3 C)] 98.8 F (37.1 C) (07/30 0949) Pulse Rate:  [81-90] 81  (07/30 0949) Resp:  [17-20] 18  (07/30 0949) BP: (94-156)/(52-85) 136/52 mmHg (07/30 0949) SpO2:  [97 %-100 %] 99 % (07/30 0949) Weight:  [154 lb 1.6 oz (69.9 kg)] 154 lb 1.6 oz (69.9 kg) 06/03/23 2131) Last BM Date: 05/28/12  Intake/Output from previous day: 03-Jun-2023 0701 - 07/30 0700 In: 1000 [I.V.:1000] Out: -  Intake/Output this shift:    Comfortable in appearance Abdomen soft, non tender. Minimal BS,  Xrays report: No significant change in the fluid-filled small bowel obstruction  pattern.  Chest: CTA Extremities: no edema  Lab Results:  No results found for this basename: WBC:2,HGB:2,HCT:2,PLT:2 in the last 72 hours BMET  [facility] 05/31/12 0520 05/29/12 0505  NA 135 140  K 4.1 3.9  CL 103 102  CO2 26 28  GLUCOSE 188* 161*  BUN 20 39*  CREATININE 0.63 0.85  CALCIUM 8.5 9.1   PT/INR No results found for this basename: LABPROT:2,INR:2 in the last 72 hours ABG No results found for this basename: PHART:2,PCO2:2,PO2:2,HCO3:2 in the last 72 hours  Studies/Results: Dg Abd 2 Views  06/02/2012  *RADIOLOGY REPORT*  Clinical Data: Abdominal discomfort.  Distention.  ABDOMEN - 2 VIEW  Comparison: CT abdomen pelvis 05/28/2012 and abdominal radiograph 05/28/2012.  Findings: There is a relative paucity of gas in the abdomen. Dilated small bowel loops and air-fluid levels are seen in the right upper quadrant.  Curvilinear calcification in the left abdomen corresponds to the wall of a low attenuation lesion off the lower pole left kidney, better seen on  05/28/2012.  Degenerative changes are seen in the spine, sacroiliac joints, hips and symphysis pubis.  Incidental imaging of the chest shows a layering left pleural effusion.  IMPRESSION:  Relative paucity of gas in the abdomen with mildly dilated loops of small bowel in the right upper quadrant and associated air fluid levels, indicating a persistent obstructive pattern.  Original Report Authenticated By: Reyes Ivan, M.D.   Dg Abd Portable 2v  05/31/2012  *RADIOLOGY REPORT*  Clinical Data: Vomiting, possible small bowel obstruction.  PORTABLE ABDOMEN - 2 VIEW  Comparison: June 02, 2012  Findings: No interval change in bowel gas pattern.  Relative paucity of small bowel gas in keeping with known fluid filled loops.  Air within mildly dilated bowel loops of the right upper quadrant, similar to prior. The lung bases/hemidiaphragms are essentially excluded from image.  Degenerative changes of the lumbar spine and SI joints.  IMPRESSION: No significant change in the fluid-filled small bowel obstruction pattern.  Original Report Authenticated By: Waneta Martins, M.D.    Anti-infectives: Anti-infectives    None      Assessment/Plan: s/p * No surgery found *   Partial SBO  Continue bowel rest.   LOS: 4 days    Azaryah Oleksy 05/31/2012

## 2012-05-31 NOTE — Progress Notes (Signed)
Pt noted with persistant nausea and foul smelling vomit, appears to be vomiting liquid stool (approx 50-75cc). Pt has no new complaints of pain or discomfort, states her bowels wont move. Notified Maren Reamer, NP ([facility]), orders received. Will continue monitoring

## 2012-06-01 ENCOUNTER — Inpatient Hospital Stay (HOSPITAL_COMMUNITY): Payer: Medicare Other

## 2012-06-01 DIAGNOSIS — R404 Transient alteration of awareness: Secondary | ICD-10-CM

## 2012-06-01 LAB — BASIC METABOLIC PANEL
BUN: 13 mg/dL (ref 6–23)
Chloride: 105 mEq/L (ref 96–112)
GFR calc Af Amer: >90 mL/min (ref >90)
GFR calc non Af Amer: 80 mL/min — ABNORMAL LOW (ref >90)
Potassium: 4 mEq/L (ref 3.5–5.1)
Sodium: 138 mEq/L (ref 135–145)

## 2012-06-01 LAB — CBC
HCT: 32.7 % — ABNORMAL LOW (ref 36.0–46.0)
Hemoglobin: 10.6 g/dL — ABNORMAL LOW (ref 12.0–15.0)
MCHC: 32.4 g/dL (ref 30.0–36.0)
RBC: 3.74 MIL/uL — ABNORMAL LOW (ref 3.87–5.11)
WBC: 6.3 10*3/uL (ref 4.0–10.5)

## 2012-06-01 LAB — GLUCOSE, CAPILLARY
Glucose-Capillary: 185 mg/dL — ABNORMAL HIGH (ref 70–99)
Glucose-Capillary: 118 mg/dL — ABNORMAL HIGH (ref 70–99)
Glucose-Capillary: 121 mg/dL — ABNORMAL HIGH (ref 70–99)
Glucose-Capillary: 81 mg/dL (ref 70–99)

## 2012-06-01 MED ORDER — BISACODYL 10 MG RE SUPP
10.0000 mg | Freq: Once | RECTAL | Status: AC
  Administered 2012-06-01: 10 mg via RECTAL
  Filled 2012-06-01: qty 1

## 2012-06-01 MED ORDER — BISACODYL 10 MG RE SUPP
10.0000 mg | Freq: Every day | RECTAL | Status: DC
  Administered 2012-06-01: 10 mg via RECTAL
  Filled 2012-06-01 (×2): qty 1

## 2012-06-01 MED ORDER — LEVOTHYROXINE SODIUM 100 MCG IV SOLR
25.0000 ug | Freq: Every day | INTRAVENOUS | Status: DC
  Administered 2012-06-01 – 2012-06-03 (×3): 26 ug via INTRAVENOUS
  Filled 2012-06-01 (×3): qty 1.3

## 2012-06-01 NOTE — Progress Notes (Signed)
Patient interviewed and examined, agree with NP note above. Her abdomen is soft and nontender. Abdominal x-rays are improved with more gas in the colon. Still however no flatus or bowel movements. She seems improved. Continue NG suction today the Mariella Saa MD, FACS  06/01/2012 4:14 PM

## 2012-06-01 NOTE — Progress Notes (Signed)
Patient ID: Megan [name]  female  AVW:[id number]    DOB: 1927-01-24    DOA: 05/27/2012  PCP: [name], MD  Subjective: Denies any significant abdominal pain, NG in place. Denies any nausea vomiting.  Objective: Weight change: -3 kg (-6 lb 9.8 oz)  Intake/Output Summary (Last 24 hours) at 06/01/12 1216 Last data filed at 06/01/12 0800  Gross per 24 hour  Intake 2666.67 ml  Output   2050 ml  Net 616.67 ml   Blood pressure 139/64, pulse 69, temperature 98.7 F (37.1 C), temperature source Oral, resp. rate 18, height 5\' 7"  (1.702 m), weight 66.9 kg (147 lb 7.8 oz), SpO2 100.00%.  Physical Exam: General: Alert and awake, oriented x3, not in any acute distress. NG in place HEENT: anicteric sclera, pupils reactive to light and accommodation, EOMI CVS: S1-S2 clear, no murmur rubs or gallops Chest: clear to auscultation bilaterally, no wheezing, rales or rhonchi Abdomen: soft nontender, mild distended, hypoactive bowel sounds, no organomegaly Extremities: no cyanosis, clubbing or edema noted bilaterally Neuro: Cranial nerves II-XII intact, no focal neurological deficits  Lab Results: Basic Metabolic Panel:  Lab 06/01/12 8295 05/31/12 0520  NA 138 135  K 4.0 4.1  CL 105 103  CO2 27 26  GLUCOSE 199* 188*  BUN 13 20  CREATININE 0.65 0.63  CALCIUM 8.1* 8.5  MG -- --  PHOS -- --   Liver Function Tests:  Lab 05/27/12 1151  AST 15  ALT 6  ALKPHOS 54  BILITOT 1.0  PROT 7.7  ALBUMIN 3.5    Lab 05/27/12 1151  LIPASE 12  AMYLASE --   No results found for this basename: AMMONIA:2 in the last 168 hours CBC:  Lab 06/01/12 0615 05/31/12 1031 05/27/12 1151  WBC 6.3 6.4 --  NEUTROABS -- -- 5.8  HGB 10.6* 11.9* --  HCT 32.7* 37.3 --  MCV 87.4 87.4 --  PLT 241 195 --   CBG:  Lab 06/01/12 1150 06/01/12 0742 05/31/12 2118 05/31/12 1635 05/31/12 1118  GLUCAP 118* 185* 135* 80 124*      Studies/Results: Dg Abd 1 View  05/31/2012  *RADIOLOGY REPORT*   Clinical Data: Nasogastric tube placement with fluoroscopic guidance by the fluoroscopic technologist.  ABDOMEN - 1 VIEW  Comparison: Earlier today.  Findings: Interval nasogastric tube with its tip in the mid stomach.  Lumbar spine degenerative changes.  IMPRESSION: Nasogastric tube tip in the mid stomach.  Original Report Authenticated By: Darrol Angel, M.D.   Ct Abdomen Pelvis W Contrast  05/28/2012  *RADIOLOGY REPORT*  Clinical Data: Abdominal pain  CT ABDOMEN AND PELVIS WITH CONTRAST  Technique:  Multidetector CT imaging of the abdomen and pelvis was performed following the standard protocol during bolus administration of intravenous contrast.  Contrast: OMNIPAQUE IOHEXOL 300 MG/ML  SOLN  Comparison: 03/25/2012  Findings: Tiny bilateral pleural effusions.  Bibasilar atelectasis.  Cardiomegaly  Hiatal hernia  The stomach and several small bowel loops are distended.  Distal ileum is relatively decompressed.  The transition point is in the right lower quadrant without conspicuous cause.  Small amount of free fluid is seen in the right paracolic gutter. Free fluid is seen layering in the pelvis.  No loculated abscess.  Diverticulosis of the descending colon is noted.  There is noticeable stranding in the fat adjacent to the descending colon as well as in the omentum.  Peritoneal carcinomatosis has a similar appearance.  This may represent edema or inflammatory changes.  Benign appearing renal cysts.  Pancreatic atrophy.  Pancreatic and common bile duct are prominent but this might simply reflect bowel obstruction.  Duodenal diverticuli are present.  Nonspecific hypodensity at the dome of the liver is stable.  IMPRESSION: Small bowel obstruction pattern.  The transition point is in the distal ileum.  There is stranding within the fat adjacent to the descending colon and omentum.  This might reflect edema, inflammatory changes, or peritoneal carcinomatosis.  Colonic diverticulosis is associated.  Original  Report Authenticated By: Donavan Burnet, M.D.   Dg Abd 2 Views  06/01/2012  *RADIOLOGY REPORT*  Clinical Data: Small bowel obstruction  ABDOMEN - 2 VIEW  Findings:  An enteric tube overlies the left upper abdominal quadrant, tip overlying the expected location of the gastric antrum. There is an overall paucity of bowel gas.  Moderate colonic stool burden.  No pneumoperitoneum, pneumatosis or portal venous gas.  Limited visualization lower thorax demonstrate small bilateral effusions and bibasilar heterogeneous / consolidative opacities.  Lumbar spine and bilateral hip degenerative change, right greater than left.  Right-sided SI joint degenerative change.  Vascular calcifications.  IMPRESSION: 1.  Paucity of bowel gas without definite evidence of obstruction.  2. Small bilateral pleural effusions and basilar heterogeneous opacities, atelectasis versus infiltrate.  Further evaluation with dedicated chest radiograph may be performed as clinically indicated.  This was made a call report.  Original Report Authenticated By: Waynard Reeds, M.D.   Dg Abd 2 Views  05/30/2012  *RADIOLOGY REPORT*  Clinical Data: Abdominal discomfort.  Distention.  ABDOMEN - 2 VIEW  Comparison: CT abdomen pelvis 05/28/2012 and abdominal radiograph 05/28/2012.  Findings: There is a relative paucity of gas in the abdomen. Dilated small bowel loops and air-fluid levels are seen in the right upper quadrant.  Curvilinear calcification in the left abdomen corresponds to the wall of a low attenuation lesion off the lower pole left kidney, better seen on 05/28/2012.  Degenerative changes are seen in the spine, sacroiliac joints, hips and symphysis pubis.  Incidental imaging of the chest shows a layering left pleural effusion.  IMPRESSION:  Relative paucity of gas in the abdomen with mildly dilated loops of small bowel in the right upper quadrant and associated air fluid levels, indicating a persistent obstructive pattern.  Original Report  Authenticated By: Reyes Ivan, M.D.   Dg Abd 2 Views  05/28/2012  *RADIOLOGY REPORT*  Clinical Data: 78 year old female with abdominal pain and distention.  Possible small bowel obstruction.  ABDOMEN - 2 VIEW  Comparison: 05/27/2012 and prior abdominal radiographs  Findings: Gas and fluid filled small bowel loops are again identified with the dilated loop in the left lower abdomen/pelvis now not visualized. A small amount of gas in the colon is noted. There is no evidence of pneumoperitoneum. Bibasilar atelectasis and degenerative changes in the lumbar spine and hips are again noted.  IMPRESSION: Nonspecific bowel gas pattern again identified without pneumoperitoneum. Apparent slight improvement of distended bowel loops in the left lower abdomen. Small bowel obstruction is not excluded given these findings.  Original Report Authenticated By: Rosendo Gros, M.D.   Dg Abd 2 Views  05/27/2012  *RADIOLOGY REPORT*  Clinical Data: Altered mental status.  Question free intraperitoneal air.  ABDOMEN - 2 VIEW  Comparison: Two views the abdomen 04/27/2012.  Findings: No free intraperitoneal air is identified.  There are gas filled and mildly dilated loops of small bowel measuring up to 3.4 cm in diameter with air-fluid levels present.  Lumbar scoliosis and degenerative  change are noted.  Degenerative disease is also present about the hips, worse on the right.  IMPRESSION:  1.  Findings worrisome for small bowel obstruction. 2.  Negative for free intraperitoneal air.  Original Report Authenticated By: Bernadene Bell. Maricela Curet, M.D.   Dg Abd Portable 2v  05/31/2012  *RADIOLOGY REPORT*  Clinical Data: Vomiting, possible small bowel obstruction.  PORTABLE ABDOMEN - 2 VIEW  Comparison: 05/30/2012  Findings: No interval change in bowel gas pattern.  Relative paucity of small bowel gas in keeping with known fluid filled loops.  Air within mildly dilated bowel loops of the right upper quadrant, similar to prior. The lung  bases/hemidiaphragms are essentially excluded from image.  Degenerative changes of the lumbar spine and SI joints.  IMPRESSION: No significant change in the fluid-filled small bowel obstruction pattern.  Original Report Authenticated By: Waneta Martins, M.D.   Dg Naso G Tube Plc W/fl-no Rad  05/31/2012  CLINICAL DATA: SBO, unable to place tube at bedside   NASO G TUBE PLACEMENT WITH FLUORO  Fluoroscopy was utilized by the requesting physician.  No radiographic  interpretation.      Medications: Scheduled Meds:   . antiseptic oral rinse  15 mL Mouth Rinse q12n4p  . chlorhexidine  15 mL Mouth Rinse BID  . donepezil  5 mg Oral Daily  . heparin  5,000 Units Subcutaneous Q8H  . insulin aspart  0-15 Units Subcutaneous TID WC  . levothyroxine  50 mcg Oral Daily  . pantoprazole (PROTONIX) IV  40 mg Intravenous Q24H  . Travoprost (BAK Free)  1 drop Both Eyes QHS   Continuous Infusions:   . dextrose 5 % and 0.45 % NaCl with KCl 20 mEq/L 100 mL/hr at 05/31/12 0520     Assessment/Plan: Principal Problem:  *SBO (small bowel obstruction) - Continue n.p.o., bowel rest, NG, surgery following - Abdominal KUB done today without definitive obstruction await for further plan from surgery, ? Start ice chips/clears   Active Problems:  AKI (acute kidney injury): Resolved, likely prerenal, continue gentle hydration (and decreased rate, developing pleural effusions.)    DM (diabetes mellitus), type 2 with complications: - Continue sliding scale insulin, no adjustments until started on diet    Hypertension: Stable    Hypothyroidism: - Change to IV Synthroid, check TSH   DVT Prophylaxis:Heparin subcutaneous   Code Status:  Disposition:Not medically ready    LOS: 5 days   Salayah Meares M.D. [facility] 06/01/2012, 12:16 PM Pager: [phone]  If 7PM-7AM, please contact night-coverage [email or web address] Password [email or web address]

## 2012-06-01 NOTE — Progress Notes (Signed)
Speech Pathology Late Entry: G-code entry based on chart review   05/23/12 1357  SLP G-Codes **NOT FOR INPATIENT CLASS**  Functional Assessment Tool Used clinical judgement based on chart review  Functional Limitations Swallowing  Swallow Current Status (Z6109) CJ  Swallow Goal Status (U0454) CI  SLP Evaluations  $ SLP Speech Visit 1 Procedure  SLP Evaluations  $BSS Swallow 1 Procedure   Myra Rude, M.S.,CCC-SLP Pager [phone]

## 2012-06-01 NOTE — Progress Notes (Signed)
Pt reported feeling hungry during morning assessment. NP came in to see pt at this time and notified pt it may be a few more days before diet will be changed. RN notified NP of decrease in output from ng tube since initial placement. Pt agreed to medical advice and tx plan given by NP. Was given ice chips for sore throat r/t ng tube in place and NP to order throat spray. Will continue to monitor. - Christell Faith, RN

## 2012-06-01 NOTE — Progress Notes (Signed)
Patient ID: NATARSHA HURWITZ, female   DOB: 1927/06/17, 78 y.o.   MRN: [id number] Patient ID: JANETE QUILLING, female   DOB: 1927/04/19, 78 y.o.   MRN: [id number] Patient ID: JOHNNYE SANDFORD, female   DOB: Jan 07, 1927, 78 y.o.   MRN: [id number]    Subjective: No reports of cramping abdominal pain, states that she is [place]. +flatus, ng in place.   Objective: Vital signs in last 24 hours: Temp:  [98.5 F (36.9 C)-98.8 F (37.1 C)] 98.5 F (36.9 C) (07/31 0865) Pulse Rate:  [74-92] 74  (07/31 0608) Resp:  [17-18] 18  (07/31 0608) BP: (125-151)/(52-68) 139/68 mmHg (07/31 0608) SpO2:  [96 %-99 %] 96 % (07/31 0608) Weight:  [147 lb 7.8 oz (66.9 kg)] 147 lb 7.8 oz (66.9 kg) (07/30 2131) Last BM Date: 05/31/12  Intake/Output from previous day: 07/30 0701 - 07/31 0700 In: -  Out: 1150 [Emesis/NG output:1150] Intake/Output this shift:    Comfortable in appearance Abdomen soft, non tender. Minimal BS, NG tube in place ( past 24 hours) Chest: CTA Extremities: no edema  Lab Results:   Basename 06/01/12 0615 05/31/12 1031  WBC 6.3 6.4  HGB 10.6* 11.9*  HCT 32.7* 37.3  PLT 241 195   BMET  Basename 06/01/12 0615 05/31/12 0520  NA 138 135  K 4.0 4.1  CL 105 103  CO2 27 26  GLUCOSE 199* 188*  BUN 13 20  CREATININE 0.65 0.63  CALCIUM 8.1* 8.5   PT/INR No results found for this basename: LABPROT:2,INR:2 in the last 72 hours ABG No results found for this basename: PHART:2,PCO2:2,PO2:2,HCO3:2 in the last 72 hours  Studies/Results: Dg Abd 1 View  05/31/2012  *RADIOLOGY REPORT*  Clinical Data: Nasogastric tube placement with fluoroscopic guidance by the fluoroscopic technologist.  ABDOMEN - 1 VIEW  Comparison: Earlier today.  Findings: Interval nasogastric tube with its tip in the mid stomach.  Lumbar spine degenerative changes.  IMPRESSION: Nasogastric tube tip in the mid stomach.  Original Report Authenticated By: Darrol Angel, M.D.   Dg Abd Portable 2v  05/31/2012   *RADIOLOGY REPORT*  Clinical Data: Vomiting, possible small bowel obstruction.  PORTABLE ABDOMEN - 2 VIEW  Comparison: 05/30/2012  Findings: No interval change in bowel gas pattern.  Relative paucity of small bowel gas in keeping with known fluid filled loops.  Air within mildly dilated bowel loops of the right upper quadrant, similar to prior. The lung bases/hemidiaphragms are essentially excluded from image.  Degenerative changes of the lumbar spine and SI joints.  IMPRESSION: No significant change in the fluid-filled small bowel obstruction pattern.  Original Report Authenticated By: Waneta Martins, M.D.   Dg Naso G Tube Plc W/fl-no Rad  05/31/2012  CLINICAL DATA: SBO, unable to place tube at bedside   NASO G TUBE PLACEMENT WITH FLUORO  Fluoroscopy was utilized by the requesting physician.  No radiographic  interpretation.      Anti-infectives: Anti-infectives    None      Assessment/Plan: s/p * No surgery found *   Partial SBO Continue bowel rest. Will recheck abd films today   LOS: 5 days    Benino Korinek 06/01/2012

## 2012-06-01 NOTE — Progress Notes (Signed)
Speech Pathology Late Entry 06/01/12: G-code entry based on chart review.  Myra Rude, M.S.,CCC-SLP Pager [phone]

## 2012-06-02 ENCOUNTER — Inpatient Hospital Stay (HOSPITAL_COMMUNITY): Payer: Medicare Other

## 2012-06-02 LAB — GLUCOSE, CAPILLARY
Glucose-Capillary: 88 mg/dL (ref 70–99)
Glucose-Capillary: 164 mg/dL — ABNORMAL HIGH (ref 70–99)
Glucose-Capillary: 155 mg/dL — ABNORMAL HIGH (ref 70–99)

## 2012-06-02 MED ORDER — BOOST / RESOURCE BREEZE PO LIQD
1.0000 | Freq: Three times a day (TID) | ORAL | Status: DC
  Administered 2012-06-02 – 2012-06-03 (×2): 1 via ORAL

## 2012-06-02 NOTE — Progress Notes (Signed)
Patient ID: Megan [name]  female  AOZ:[id number]    DOB: 1927-04-08    DOA: 05/27/2012  PCP: [name], MD  Subjective: Denies any nausea, vomiting, abdominal pain, large watery bowel movement today.  Objective: Weight change:   Intake/Output Summary (Last 24 hours) at 06/02/12 1443 Last data filed at 06/02/12 0855  Gross per 24 hour  Intake 1562.67 ml  Output      1 ml  Net 1561.67 ml   Blood pressure 145/46, pulse 63, temperature 98.3 F (36.8 C), temperature source Oral, resp. rate 20, height 5\' 7"  (1.702 m), weight 66.9 kg (147 lb 7.8 oz), SpO2 100.00%.  Physical Exam: General: Alert and awake, oriented x3, not in any acute distress. NG clamped HEENT: anicteric sclera, pupils reactive to light and accommodation, EOMI CVS: S1-S2 clear, no murmur rubs or gallops Chest: clear to auscultation bilaterally, no wheezing, rales or rhonchi Abdomen: soft nontender, mild distended, hypoactive bowel sounds, no organomegaly Extremities: no cyanosis, clubbing or edema noted bilaterally  Lab Results: Basic Metabolic Panel:  Lab 06/01/12 9629 05/31/12 0520  NA 138 135  K 4.0 4.1  CL 105 103  CO2 27 26  GLUCOSE 199* 188*  BUN 13 20  CREATININE 0.65 0.63  CALCIUM 8.1* 8.5  MG -- --  PHOS -- --   Liver Function Tests:  Lab 05/27/12 1151  AST 15  ALT 6  ALKPHOS 54  BILITOT 1.0  PROT 7.7  ALBUMIN 3.5    Lab 05/27/12 1151  LIPASE 12  AMYLASE --   No results found for this basename: AMMONIA:2 in the last 168 hours CBC:  Lab 06/01/12 0615 05/31/12 1031 05/27/12 1151  WBC 6.3 6.4 --  NEUTROABS -- -- 5.8  HGB 10.6* 11.9* --  HCT 32.7* 37.3 --  MCV 87.4 87.4 --  PLT 241 195 --   CBG:  Lab 06/02/12 1149 06/02/12 0851 06/01/12 2120 06/01/12 1739 06/01/12 1150  GLUCAP 88 160* 81 121* 118*      Studies/Results: Dg Abd 1 View  05/31/2012  *RADIOLOGY REPORT*  Clinical Data: Nasogastric tube placement with fluoroscopic guidance by the fluoroscopic  technologist.  ABDOMEN - 1 VIEW  Comparison: Earlier today.  Findings: Interval nasogastric tube with its tip in the mid stomach.  Lumbar spine degenerative changes.  IMPRESSION: Nasogastric tube tip in the mid stomach.  Original Report Authenticated By: Darrol Angel, M.D.   Ct Abdomen Pelvis W Contrast  05/28/2012  *RADIOLOGY REPORT*  Clinical Data: Abdominal pain  CT ABDOMEN AND PELVIS WITH CONTRAST  Technique:  Multidetector CT imaging of the abdomen and pelvis was performed following the standard protocol during bolus administration of intravenous contrast.  Contrast: OMNIPAQUE IOHEXOL 300 MG/ML  SOLN  Comparison: 03/25/2012  Findings: Tiny bilateral pleural effusions.  Bibasilar atelectasis.  Cardiomegaly  Hiatal hernia  The stomach and several small bowel loops are distended.  Distal ileum is relatively decompressed.  The transition point is in the right lower quadrant without conspicuous cause.  Small amount of free fluid is seen in the right paracolic gutter. Free fluid is seen layering in the pelvis.  No loculated abscess.  Diverticulosis of the descending colon is noted.  There is noticeable stranding in the fat adjacent to the descending colon as well as in the omentum.  Peritoneal carcinomatosis has a similar appearance.  This may represent edema or inflammatory changes.  Benign appearing renal cysts.  Pancreatic atrophy.  Pancreatic and common bile duct are prominent but this might  simply reflect bowel obstruction.  Duodenal diverticuli are present.  Nonspecific hypodensity at the dome of the liver is stable.  IMPRESSION: Small bowel obstruction pattern.  The transition point is in the distal ileum.  There is stranding within the fat adjacent to the descending colon and omentum.  This might reflect edema, inflammatory changes, or peritoneal carcinomatosis.  Colonic diverticulosis is associated.  Original Report Authenticated By: Donavan Burnet, M.D.   Dg Abd 2 Views  06/01/2012  *RADIOLOGY  REPORT*  Clinical Data: Small bowel obstruction  ABDOMEN - 2 VIEW  Findings:  An enteric tube overlies the left upper abdominal quadrant, tip overlying the expected location of the gastric antrum. There is an overall paucity of bowel gas.  Moderate colonic stool burden.  No pneumoperitoneum, pneumatosis or portal venous gas.  Limited visualization lower thorax demonstrate small bilateral effusions and bibasilar heterogeneous / consolidative opacities.  Lumbar spine and bilateral hip degenerative change, right greater than left.  Right-sided SI joint degenerative change.  Vascular calcifications.  IMPRESSION: 1.  Paucity of bowel gas without definite evidence of obstruction.  2. Small bilateral pleural effusions and basilar heterogeneous opacities, atelectasis versus infiltrate.  Further evaluation with dedicated chest radiograph may be performed as clinically indicated.  This was made a call report.  Original Report Authenticated By: Waynard Reeds, M.D.   Dg Abd 2 Views  05/30/2012  *RADIOLOGY REPORT*  Clinical Data: Abdominal discomfort.  Distention.  ABDOMEN - 2 VIEW  Comparison: CT abdomen pelvis 05/28/2012 and abdominal radiograph 05/28/2012.  Findings: There is a relative paucity of gas in the abdomen. Dilated small bowel loops and air-fluid levels are seen in the right upper quadrant.  Curvilinear calcification in the left abdomen corresponds to the wall of a low attenuation lesion off the lower pole left kidney, better seen on 05/28/2012.  Degenerative changes are seen in the spine, sacroiliac joints, hips and symphysis pubis.  Incidental imaging of the chest shows a layering left pleural effusion.  IMPRESSION:  Relative paucity of gas in the abdomen with mildly dilated loops of small bowel in the right upper quadrant and associated air fluid levels, indicating a persistent obstructive pattern.  Original Report Authenticated By: Reyes Ivan, M.D.   Dg Abd 2 Views  05/28/2012  *RADIOLOGY REPORT*   Clinical Data: 78 year old female with abdominal pain and distention.  Possible small bowel obstruction.  ABDOMEN - 2 VIEW  Comparison: 05/27/2012 and prior abdominal radiographs  Findings: Gas and fluid filled small bowel loops are again identified with the dilated loop in the left lower abdomen/pelvis now not visualized. A small amount of gas in the colon is noted. There is no evidence of pneumoperitoneum. Bibasilar atelectasis and degenerative changes in the lumbar spine and hips are again noted.  IMPRESSION: Nonspecific bowel gas pattern again identified without pneumoperitoneum. Apparent slight improvement of distended bowel loops in the left lower abdomen. Small bowel obstruction is not excluded given these findings.  Original Report Authenticated By: Rosendo Gros, M.D.   Dg Abd 2 Views  05/27/2012  *RADIOLOGY REPORT*  Clinical Data: Altered mental status.  Question free intraperitoneal air.  ABDOMEN - 2 VIEW  Comparison: Two views the abdomen 04/27/2012.  Findings: No free intraperitoneal air is identified.  There are gas filled and mildly dilated loops of small bowel measuring up to 3.4 cm in diameter with air-fluid levels present.  Lumbar scoliosis and degenerative change are noted.  Degenerative disease is also present about the hips, worse  on the right.  IMPRESSION:  1.  Findings worrisome for small bowel obstruction. 2.  Negative for free intraperitoneal air.  Original Report Authenticated By: Bernadene Bell. Maricela Curet, M.D.   Dg Abd Portable 2v  05/31/2012  *RADIOLOGY REPORT*  Clinical Data: Vomiting, possible small bowel obstruction.  PORTABLE ABDOMEN - 2 VIEW  Comparison: 05/30/2012  Findings: No interval change in bowel gas pattern.  Relative paucity of small bowel gas in keeping with known fluid filled loops.  Air within mildly dilated bowel loops of the right upper quadrant, similar to prior. The lung bases/hemidiaphragms are essentially excluded from image.  Degenerative changes of the lumbar  spine and SI joints.  IMPRESSION: No significant change in the fluid-filled small bowel obstruction pattern.  Original Report Authenticated By: Waneta Martins, M.D.   Dg Naso G Tube Plc W/fl-no Rad  05/31/2012  CLINICAL DATA: SBO, unable to place tube at bedside   NASO G TUBE PLACEMENT WITH FLUORO  Fluoroscopy was utilized by the requesting physician.  No radiographic  interpretation.      Medications: Scheduled Meds:    . antiseptic oral rinse  15 mL Mouth Rinse q12n4p  . bisacodyl  10 mg Rectal Daily  . chlorhexidine  15 mL Mouth Rinse BID  . donepezil  5 mg Oral Daily  . heparin  5,000 Units Subcutaneous Q8H  . insulin aspart  0-15 Units Subcutaneous TID WC  . levothyroxine  26 mcg Intravenous Daily  . pantoprazole (PROTONIX) IV  40 mg Intravenous Q24H  . Travoprost (BAK Free)  1 drop Both Eyes QHS   Continuous Infusions:    . dextrose 5 % and 0.45 % NaCl with KCl 20 mEq/L 60 mL/hr (06/02/12 1404)     Assessment/Plan: Principal Problem:  *SBO (small bowel obstruction) - Patient had a large bowel movement today, watery, NG clamped - Discussed with general surgery, and recommended starting clears today  Active Problems:  AKI (acute kidney injury): Resolved, likely prerenal, continue gentle hydration    DM (diabetes mellitus), type 2 with complications: - Continue sliding scale insulin, no adjustments until tolerating diet    Hypertension: Stable    Hypothyroidism: - Continue IV Synthroid, TSH 3.9  DVT Prophylaxis:Heparin subcutaneous   Code Status:  Disposition:Not medically ready, hopefully DC in 1-2 days when tolerating diet    LOS: 6 days   RAI,RIPUDEEP M.D. [facility] 06/02/2012, 2:43 PM Pager: [phone]  If 7PM-7AM, please contact night-coverage [email or web address] Password [email or web address]

## 2012-06-02 NOTE — Progress Notes (Signed)
INITIAL ADULT NUTRITION ASSESSMENT Date: 06/02/2012   Time: 2:55 PM Reason for Assessment: health history   INTERVENTION: 1. Resource Breeze po TID, each supplement provides 250 kcal and  grams of protein. 2. RD will continue to follow    ASSESSMENT: Female 78 y.o.  Dx: SBO (small bowel obstruction)  Hx:  Past Medical History  Diagnosis Date  . Hypertension   . Diastolic dysfunction, left ventricle by ECHO 2011 03/25/2012  . Hypothyroidism 03/25/2012  . High cholesterol   . Type II diabetes mellitus   . H/O hiatal hernia   . Duodenal perforation 03/23/12  . Arthritis     "in my back"    Related Meds:     . antiseptic oral rinse  15 mL Mouth Rinse q12n4p  . bisacodyl  10 mg Rectal Daily  . chlorhexidine  15 mL Mouth Rinse BID  . donepezil  5 mg Oral Daily  . heparin  5,000 Units Subcutaneous Q8H  . insulin aspart  0-15 Units Subcutaneous TID WC  . levothyroxine  26 mcg Intravenous Daily  . pantoprazole (PROTONIX) IV  40 mg Intravenous Q24H  . Travoprost (BAK Free)  1 drop Both Eyes QHS     Ht: 5\' 7"  (170.2 cm)  Wt: 147 lb 7.8 oz (66.9 kg)  Ideal Wt: 61.4 kg  % Ideal Wt: 109%  Usual Wt:  Wt Readings from Last 5 Encounters:  05/31/12 147 lb 7.8 oz (66.9 kg)  05/02/12 143 lb 1.6 oz (64.91 kg)  03/31/12 160 lb (72.576 kg)  03/31/12 160 lb (72.576 kg)   Admission weight on 5/23- 149 lbs (68 kg)   % Usual Wt: 9%  Body mass index is 23.10 kg/(m^2). WNL per current BMI   Food/Nutrition Related Hx: Pt unable/unwilling to tell me any nutrition information    Labs:  CMP     Component Value Date/Time   NA 138 06/01/2012 0615   K 4.0 06/01/2012 0615   CL 105 06/01/2012 0615   CO2 27 06/01/2012 0615   GLUCOSE 199* 06/01/2012 0615   BUN 13 06/01/2012 0615   CREATININE 0.65 06/01/2012 0615   CALCIUM 8.1* 06/01/2012 0615   PROT 7.7 05/27/2012 1151   ALBUMIN 3.5 05/27/2012 1151   AST 15 05/27/2012 1151   ALT 6 05/27/2012 1151   ALKPHOS 54 05/27/2012 1151   BILITOT 1.0  05/27/2012 1151   GFRNONAA 80* 06/01/2012 0615   GFRAA >90 06/01/2012 0615     Intake/Output Summary (Last 24 hours) at 06/02/12 1503 Last data filed at 06/02/12 0855  Gross per 24 hour  Intake 1562.67 ml  Output      1 ml  Net 1561.67 ml     Diet Order: Clear Liquid  Supplements/Tube Feeding: none  IVF:    dextrose 5 % and 0.45 % NaCl with KCl 20 mEq/L Last Rate: 60 mL/hr (06/02/12 1404)    Estimated Nutritional Needs:   Kcal: 1400-1600 Protein: 65-75 gm  Fluid:  1.5-1.7 L   Pt admitted with SBO. Weight from last RD note, 5/30, pt was 156 lbs. At this admission pt was 140 lbs. This shows a 10% weight loss over about 2 months. Since admission, pt has gained 7 lbs, likely some weight gain r/t fluids and retention with SBO. Also likely dehydrated on admission given N/V was her chief complaint. Weight loss is likely not significant.   Pt now with NGT with suction, taking clear liquids.   NUTRITION DIAGNOSIS: -Inadequate protein energy intake (NI-5.3).  Status:  Ongoing  RELATED TO: diet provision   AS EVIDENCE BY: clear liquid diet provides minimal kcal and protein  MONITORING/EVALUATION(Goals): Goal: Diet advance as medically able   Monitor: diet advance, weight, labs  EDUCATION NEEDS: -No education needs identified at this time    DOCUMENTATION CODES Per approved criteria  -Not Applicable   Clarene Duke RD, LDN Pager [phone] After Hours pager [phone]

## 2012-06-02 NOTE — Progress Notes (Signed)
Patient ID: ALEIAH MOHAMMED, female   DOB: [birth year], 78 y.o.   MRN: [id number] Patient ID: ALISHIA LEBO, female   DOB: Apr 20, 1927, 78 y.o.   MRN: [id number] Patient ID: ZALIAH WISSNER, female   DOB: [birth year], 78 y.o.   MRN: [id number] Patient ID: SHANIYA TASHIRO, female   DOB: Jun 23, 1927, 78 y.o.   MRN: [id number]    Subjective: No reports of cramping abdominal pain, states that she is [place]. +flatus, ng in place.   Objective: Vital signs in last 24 hours: Temp:  [97.7 F (36.5 C)-98.9 F (37.2 C)] 97.7 F (36.5 C) (08/01 0607) Pulse Rate:  [62-72] 62  (08/01 0607) Resp:  [18] 18  (08/01 0607) BP: (134-146)/(46-70) 136/46 mmHg (08/01 0607) SpO2:  [95 %-100 %] 97 % (08/01 0607) Last BM Date: 06/01/12  Intake/Output from previous day: 07/31 0701 - 08/01 0700 In: 4229.3 [I.V.:4229.3] Out: 900 [Emesis/NG output:900] Intake/Output this shift:    Comfortable in appearance Abdomen soft, non tender. Minimal BS, NG tube in place ( past 24 hours) Chest: CTA Extremities: no edema Positive response to dulcolax suppository BM X 2 recorded  Lab Results:   Basename 06/01/12 0615 05/31/12 1031  WBC 6.3 6.4  HGB 10.6* 11.9*  HCT 32.7* 37.3  PLT 241 195   BMET  Basename 06/01/12 0615 05/31/12 0520  NA 138 135  K 4.0 4.1  CL 105 103  CO2 27 26  GLUCOSE 199* 188*  BUN 13 20  CREATININE 0.65 0.63  CALCIUM 8.1* 8.5   PT/INR No results found for this basename: LABPROT:2,INR:2 in the last 72 hours ABG No results found for this basename: PHART:2,PCO2:2,PO2:2,HCO3:2 in the last 72 hours  Studies/Results: Dg Abd 1 View  06/02/2012  *RADIOLOGY REPORT*  Clinical Data: NG tube placement  ABDOMEN - 1 VIEW  Comparison: June 01, 2012  Findings: The tip of the NG tube is at the expected region of the gastric antrum, unchanged compared with yesterday's film.  IMPRESSION: NG tube tip at the expected region of gastric antrum.  Original Report Authenticated By: Brandon Melnick, M.D.    Dg Abd 1 View  05/31/2012  *RADIOLOGY REPORT*  Clinical Data: Nasogastric tube placement with fluoroscopic guidance by the fluoroscopic technologist.  ABDOMEN - 1 VIEW  Comparison: Earlier today.  Findings: Interval nasogastric tube with its tip in the mid stomach.  Lumbar spine degenerative changes.  IMPRESSION: Nasogastric tube tip in the mid stomach.  Original Report Authenticated By: Darrol Angel, M.D.   Dg Abd 2 Views  06/01/2012  *RADIOLOGY REPORT*  Clinical Data: Small bowel obstruction  ABDOMEN - 2 VIEW  Findings:  An enteric tube overlies the left upper abdominal quadrant, tip overlying the expected location of the gastric antrum. There is an overall paucity of bowel gas.  Moderate colonic stool burden.  No pneumoperitoneum, pneumatosis or portal venous gas.  Limited visualization lower thorax demonstrate small bilateral effusions and bibasilar heterogeneous / consolidative opacities.  Lumbar spine and bilateral hip degenerative change, right greater than left.  Right-sided SI joint degenerative change.  Vascular calcifications.  IMPRESSION: 1.  Paucity of bowel gas without definite evidence of obstruction.  2. Small bilateral pleural effusions and basilar heterogeneous opacities, atelectasis versus infiltrate.  Further evaluation with dedicated chest radiograph may be performed as clinically indicated.  This was made a call report.  Original Report Authenticated By: Waynard Reeds, M.D.   Dg Naso G Tube Plc W/fl-no Rad  05/31/2012  CLINICAL DATA: SBO,  unable to place tube at bedside   NASO G TUBE PLACEMENT WITH FLUORO  Fluoroscopy was utilized by the requesting physician.  No radiographic  interpretation.      Anti-infectives: Anti-infectives    None      Assessment/Plan: s/p * No surgery found *   Partial SBO (resolving slowly) Clamp NG Trial of sips of clears (patient having BM's)   LOS: 6 days    Rokia Bosket 06/02/2012

## 2012-06-02 NOTE — Progress Notes (Signed)
Patient interviewed and examined, agree with NP note above. Has had loose BM, appears to be improving  Mariella Saa MD, FACS  06/02/2012 2:00 PM

## 2012-06-03 ENCOUNTER — Encounter (HOSPITAL_COMMUNITY): Payer: Self-pay | Admitting: *Deleted

## 2012-06-03 ENCOUNTER — Inpatient Hospital Stay (HOSPITAL_COMMUNITY): Payer: Medicare Other

## 2012-06-03 ENCOUNTER — Inpatient Hospital Stay (HOSPITAL_COMMUNITY): Payer: Medicare Other | Admitting: *Deleted

## 2012-06-03 ENCOUNTER — Encounter (HOSPITAL_COMMUNITY): Admission: EM | Disposition: A | Discharge status: Home / Self Care | Payer: Self-pay | Source: Home / Self Care

## 2012-06-03 DIAGNOSIS — K565 Intestinal adhesions [bands], unspecified as to partial versus complete obstruction: Secondary | ICD-10-CM

## 2012-06-03 HISTORY — PX: LAPAROTOMY: SHX154

## 2012-06-03 LAB — GLUCOSE, CAPILLARY
Glucose-Capillary: 153 mg/dL — ABNORMAL HIGH (ref 70–99)
Glucose-Capillary: 56 mg/dL — ABNORMAL LOW (ref 70–99)
Glucose-Capillary: 136 mg/dL — ABNORMAL HIGH (ref 70–99)
Glucose-Capillary: 108 mg/dL — ABNORMAL HIGH (ref 70–99)

## 2012-06-03 LAB — CBC
MCH: 27.7 pg (ref 26.0–34.0)
Platelets: 211 10*3/uL (ref 150–400)
RBC: 4.51 MIL/uL (ref 3.87–5.11)
RDW: 15 % (ref 11.5–15.5)
WBC: 10.8 10*3/uL — ABNORMAL HIGH (ref 4.0–10.5)

## 2012-06-03 LAB — BASIC METABOLIC PANEL
CO2: 28 mEq/L (ref 19–32)
Calcium: 9.1 mg/dL (ref 8.4–10.5)
GFR calc Af Amer: >90 mL/min (ref >90)
Sodium: 137 mEq/L (ref 135–145)

## 2012-06-03 SURGERY — LAPAROTOMY, EXPLORATORY
Anesthesia: General | Site: Abdomen | Wound class: Clean

## 2012-06-03 MED ORDER — KCL IN DEXTROSE-NACL 20-5-0.45 MEQ/L-%-% IV SOLN
INTRAVENOUS | Status: DC
  Administered 2012-06-03 – 2012-06-07 (×9): via INTRAVENOUS
  Administered 2012-06-08: 100 mL/h via INTRAVENOUS
  Administered 2012-06-08 – 2012-06-09 (×4): via INTRAVENOUS
  Filled 2012-06-03 (×18): qty 1000

## 2012-06-03 MED ORDER — ONDANSETRON HCL 4 MG/2ML IJ SOLN: 4.0000 mg | Freq: Once | INTRAMUSCULAR | Status: AC | PRN

## 2012-06-03 MED ORDER — LIDOCAINE HCL (CARDIAC) 20 MG/ML IV SOLN
INTRAVENOUS | Status: DC | PRN
  Administered 2012-06-03: 70 mg via INTRAVENOUS

## 2012-06-03 MED ORDER — LEVOTHYROXINE SODIUM 50 MCG PO TABS
50.0000 ug | ORAL_TABLET | Freq: Every day | ORAL | Status: DC
  Filled 2012-06-03: qty 1

## 2012-06-03 MED ORDER — LACTATED RINGERS IV SOLN
INTRAVENOUS | Status: DC | PRN
  Administered 2012-06-03 (×2): via INTRAVENOUS

## 2012-06-03 MED ORDER — DEXTROSE 50 % IV SOLN
INTRAVENOUS | Status: AC
  Administered 2012-06-03: 50 mL
  Filled 2012-06-03: qty 50

## 2012-06-03 MED ORDER — ALBUMIN HUMAN 5 % IV SOLN
INTRAVENOUS | Status: DC | PRN
  Administered 2012-06-03 (×2): via INTRAVENOUS

## 2012-06-03 MED ORDER — LEVOTHYROXINE SODIUM 25 MCG PO TABS: 25.0000 ug | ORAL_TABLET | Freq: Every day | ORAL | Status: DC

## 2012-06-03 MED ORDER — ROCURONIUM BROMIDE 100 MG/10ML IV SOLN
INTRAVENOUS | Status: DC | PRN
  Administered 2012-06-03: 20 mg via INTRAVENOUS
  Administered 2012-06-03: 30 mg via INTRAVENOUS

## 2012-06-03 MED ORDER — LEVOTHYROXINE SODIUM 100 MCG IV SOLR
25.0000 ug | Freq: Every day | INTRAVENOUS | Status: DC
  Administered 2012-06-04 – 2012-06-09 (×6): 26 ug via INTRAVENOUS
  Filled 2012-06-03 (×6): qty 1.3

## 2012-06-03 MED ORDER — PROPOFOL 10 MG/ML IV BOLUS
INTRAVENOUS | Status: DC | PRN
  Administered 2012-06-03: 100 mg via INTRAVENOUS

## 2012-06-03 MED ORDER — MORPHINE SULFATE 2 MG/ML IJ SOLN
2.0000 mg | INTRAMUSCULAR | Status: DC | PRN
  Administered 2012-06-04 – 2012-06-09 (×5): 2 mg via INTRAVENOUS
  Filled 2012-06-03 (×5): qty 1

## 2012-06-03 MED ORDER — ERTAPENEM SODIUM 1 G IJ SOLR
1.0000 g | Freq: Once | INTRAMUSCULAR | Status: AC
  Administered 2012-06-03: 1 g via INTRAVENOUS
  Filled 2012-06-03: qty 1

## 2012-06-03 MED ORDER — SUCCINYLCHOLINE CHLORIDE 20 MG/ML IJ SOLN
INTRAMUSCULAR | Status: DC | PRN
  Administered 2012-06-03: 100 mg via INTRAVENOUS

## 2012-06-03 MED ORDER — SUFENTANIL CITRATE 50 MCG/ML IV SOLN
INTRAVENOUS | Status: DC | PRN
  Administered 2012-06-03: 10 ug via INTRAVENOUS
  Administered 2012-06-03: 5 ug via INTRAVENOUS

## 2012-06-03 MED ORDER — ESMOLOL HCL 10 MG/ML IV SOLN
INTRAVENOUS | Status: DC | PRN
  Administered 2012-06-03 (×2): 30 mg via INTRAVENOUS

## 2012-06-03 MED ORDER — HYDROMORPHONE HCL PF 1 MG/ML IJ SOLN: 0.2500 mg | INTRAMUSCULAR | Status: DC | PRN

## 2012-06-03 SURGICAL SUPPLY — 47 items
BLADE SURG ROTATE 9660 (MISCELLANEOUS) IMPLANT
CANISTER SUCTION 2500CC (MISCELLANEOUS) ×2 IMPLANT
CHLORAPREP W/TINT 26ML (MISCELLANEOUS) ×2 IMPLANT
CLOTH BEACON ORANGE TIMEOUT ST (SAFETY) ×2 IMPLANT
COVER SURGICAL LIGHT HANDLE (MISCELLANEOUS) ×2 IMPLANT
DRAPE LAPAROSCOPIC ABDOMINAL (DRAPES) ×2 IMPLANT
DRAPE UTILITY 15X26 W/TAPE STR (DRAPE) ×4 IMPLANT
DRAPE WARM FLUID 44X44 (DRAPE) ×2 IMPLANT
ELECT BLADE 6.5 EXT (BLADE) IMPLANT
ELECT REM PT RETURN 9FT ADLT (ELECTROSURGICAL) ×2
ELECTRODE REM PT RTRN 9FT ADLT (ELECTROSURGICAL) ×1 IMPLANT
GLOVE BIOGEL PI IND STRL 7.5 (GLOVE) IMPLANT
GLOVE BIOGEL PI IND STRL 8 (GLOVE) ×1 IMPLANT
GLOVE BIOGEL PI INDICATOR 7.5 (GLOVE) ×1
GLOVE BIOGEL PI INDICATOR 8 (GLOVE) ×1
GLOVE SS BIOGEL STRL SZ 7.5 (GLOVE) ×1 IMPLANT
GLOVE SUPERSENSE BIOGEL SZ 7.5 (GLOVE) ×2
GLOVE SURG SS PI 7.5 STRL IVOR (GLOVE) ×1 IMPLANT
GOWN STRL NON-REIN LRG LVL3 (GOWN DISPOSABLE) ×4 IMPLANT
GOWN STRL REIN XL XLG (GOWN DISPOSABLE) ×2 IMPLANT
KIT BASIN OR (CUSTOM PROCEDURE TRAY) ×2 IMPLANT
KIT ROOM TURNOVER OR (KITS) ×2 IMPLANT
LIGASURE IMPACT 36 18CM CVD LR (INSTRUMENTS) IMPLANT
MANIFOLD NEPTUNE WASTE (CANNULA) ×1 IMPLANT
NS IRRIG 1000ML POUR BTL (IV SOLUTION) ×4 IMPLANT
PACK GENERAL/GYN (CUSTOM PROCEDURE TRAY) ×2 IMPLANT
PAD ARMBOARD 7.5X6 YLW CONV (MISCELLANEOUS) ×2 IMPLANT
SPECIMEN JAR X LARGE (MISCELLANEOUS) IMPLANT
SPONGE GAUZE 4X4 12PLY (GAUZE/BANDAGES/DRESSINGS) ×1 IMPLANT
SPONGE LAP 18X18 X RAY DECT (DISPOSABLE) IMPLANT
STAPLER VISISTAT 35W (STAPLE) ×2 IMPLANT
SUCTION POOLE TIP (SUCTIONS) ×1 IMPLANT
SUT NOVA 1 T20/GS 25DT (SUTURE) IMPLANT
SUT NOVA NAB GS-21 0 18 T12 DT (SUTURE) IMPLANT
SUT PDS AB 1 CTX 36 (SUTURE) ×2 IMPLANT
SUT PDS AB 1 TP1 96 (SUTURE) ×2 IMPLANT
SUT SILK 2 0 SH CR/8 (SUTURE) ×1 IMPLANT
SUT SILK 2 0 TIES 10X30 (SUTURE) ×2 IMPLANT
SUT SILK 3 0 SH CR/8 (SUTURE) ×2 IMPLANT
SUT SILK 3 0 TIES 10X30 (SUTURE) ×2 IMPLANT
SYR BULB IRRIGATION 50ML (SYRINGE) ×1 IMPLANT
TAPE CLOTH SURG 6X10 WHT LF (GAUZE/BANDAGES/DRESSINGS) ×1 IMPLANT
TOWEL OR 17X24 6PK STRL BLUE (TOWEL DISPOSABLE) ×2 IMPLANT
TOWEL OR 17X26 10 PK STRL BLUE (TOWEL DISPOSABLE) ×2 IMPLANT
TRAY FOLEY CATH 14FRSI W/METER (CATHETERS) IMPLANT
WATER STERILE IRR 1000ML POUR (IV SOLUTION) ×1 IMPLANT
YANKAUER SUCT BULB TIP NO VENT (SUCTIONS) ×1 IMPLANT

## 2012-06-03 NOTE — Progress Notes (Signed)
Patient ID: FERRIS TALLY, female   DOB: Nov 07, 1926, 78 y.o.   MRN: [id number] Patient ID: NUHA DEGNER, female   DOB: [birth year], 78 y.o.   MRN: [id number] Patient ID: ANISSA ABBS, female   DOB: 1927-08-24, 78 y.o.   MRN: [id number] Patient ID: HALENA MOHAR, female   DOB: 10-04-1927, 78 y.o.   MRN: [id number] Patient ID: LACRYSTAL BARBE, female   DOB: November 05, 1926, 78 y.o.   MRN: [id number]    Subjective: No reports of cramping abdominal pain, states that had a BM this morning. +flatus, ng in place.   Objective: Vital signs in last 24 hours: Temp:  [98.3 F (36.8 C)-99.3 F (37.4 C)] 98.5 F (36.9 C) (08/02 0928) Pulse Rate:  [61-78] 71  (08/02 0928) Resp:  [17-20] 18  (08/02 0928) BP: (145-162)/(46-73) 148/73 mmHg (08/02 0928) SpO2:  [96 %-100 %] 98 % (08/02 0928) Weight:  [143 lb 1.3 oz (64.9 kg)] 143 lb 1.3 oz (64.9 kg) (08/01 2045) Last BM Date: 06/02/12  Intake/Output from previous day: 08/01 0701 - 08/02 0700 In: 640 [P.O.:640] Out: 1 [Stool:1] Intake/Output this shift:    Comfortable in appearance Abdomen soft, non tender. Minimal BS, NG tube in place (clamped past 24 hours) Chest: CTA Extremities: no edema   Lab Results:   [facility] 06/01/12 0615  WBC 6.3  HGB 10.6*  HCT 32.7*  PLT 241   BMET  Basename 06/01/12 0615  NA 138  K 4.0  CL 105  CO2 27  GLUCOSE 199*  BUN 13  CREATININE 0.65  CALCIUM 8.1*   PT/INR No results found for this basename: LABPROT:2,INR:2 in the last 72 hours ABG No results found for this basename: PHART:2,PCO2:2,PO2:2,HCO3:2 in the last 72 hours  Studies/Results: Dg Abd 1 View  06/02/2012  *RADIOLOGY REPORT*  Clinical Data: NG tube placement  ABDOMEN - 1 VIEW  Comparison: June 01, 2012  Findings: The tip of the NG tube is at the expected region of the gastric antrum, unchanged compared with yesterday's film.  IMPRESSION: NG tube tip at the expected region of gastric antrum.  Original Report Authenticated By: Brandon Melnick,  M.D.   Dg Naso G Tube Plc W/fl-no Rad  06/02/2012  CLINICAL DATA: replace ngt under fluoro   NASO G TUBE PLACEMENT WITH FLUORO  Fluoroscopy was utilized by the requesting physician.  No radiographic  interpretation.      Anti-infectives: Anti-infectives    None      Assessment/Plan: s/p * No surgery found *   Partial SBO (resolving) Continue with current diet for now, may advance to full liquids after discussion with Dr. Johna Sheriff. Will recheck abd films today. If okay will D/C NG.     LOS: 7 days    Zarinah Oviatt 06/03/2012

## 2012-06-03 NOTE — Anesthesia Preprocedure Evaluation (Signed)
Anesthesia Evaluation  Patient identified by MRN, date of birth, ID band Patient awake    Reviewed: Allergy & Precautions, H&P , NPO status , Patient's Chart, lab work & pertinent test results  Airway Mallampati: I TM Distance: >3 FB Neck ROM: full    Dental   Pulmonary          Cardiovascular hypertension, Rhythm:regular Rate:Normal     Neuro/Psych    GI/Hepatic hiatal hernia,   Endo/Other  Type 2, Oral Hypoglycemic Agents  Renal/GU      Musculoskeletal   Abdominal   Peds  Hematology   Anesthesia Other Findings   Reproductive/Obstetrics                           Anesthesia Physical Anesthesia Plan  ASA: III  Anesthesia Plan: General   Post-op Pain Management:    Induction: Intravenous  Airway Management Planned: Oral ETT  Additional Equipment:   Intra-op Plan:   Post-operative Plan: Possible Post-op intubation/ventilation  Informed Consent: I have reviewed the patients History and Physical, chart, labs and discussed the procedure including the risks, benefits and alternatives for the proposed anesthesia with the patient or authorized representative who has indicated his/her understanding and acceptance.     Plan Discussed with: CRNA, Anesthesiologist and Surgeon  Anesthesia Plan Comments:         Anesthesia Quick Evaluation

## 2012-06-03 NOTE — Progress Notes (Signed)
Patient ID: Megan [name], female   DOB: June 19, 1927, 78 y.o.   MRN: [id number]   Repeat abdominal x-rays today have returned showing persistent and actually worsening SBO. Abdomen remains non tender and mildly distended.  It does not appear we are making any progress with non operative management. I have recommended proceeding with laparotomy. Discussed with patient and family and they understand and agree.  Mariella Saa MD, FACS  06/03/2012, 3:58 PM

## 2012-06-03 NOTE — Progress Notes (Signed)
Patient ID: Megan [name]  female  HQI:[id number]    DOB: 12/17/1926    DOA: 05/27/2012  PCP: [name], MD  Subjective: Denies any nausea, vomiting, abdominal pain. Frustrated with her NG.  Objective: Weight change:   Intake/Output Summary (Last 24 hours) at 06/03/12 1339 Last data filed at 06/02/12 2041  Gross per 24 hour  Intake    640 ml  Output      0 ml  Net    640 ml   Blood pressure 148/73, pulse 71, temperature 98.5 F (36.9 C), temperature source Oral, resp. rate 18, height 5\' 7"  (1.702 m), weight 64.9 kg (143 lb 1.3 oz), SpO2 98.00%.  Physical Exam: General: Alert and awake, oriented x3, not in any acute distress. NG clamped HEENT: anicteric sclera, pupils reactive to light and accommodation, EOMI CVS: S1-S2 clear, no murmur rubs or gallops Chest: clear to auscultation bilaterally, no wheezing, rales or rhonchi Abdomen: soft nontender, mild distended, hypoactive bowel sounds, no organomegaly Extremities: no cyanosis, clubbing or edema noted bilaterally  Lab Results: Basic Metabolic Panel:  Lab 06/01/12 1324 05/31/12 0520  NA 138 135  K 4.0 4.1  CL 105 103  CO2 27 26  GLUCOSE 199* 188*  BUN 13 20  CREATININE 0.65 0.63  CALCIUM 8.1* 8.5  MG -- --  PHOS -- --   Liver Function Tests: No results found for this basename: AST:2,ALT:2,ALKPHOS:2,BILITOT:2,PROT:2,ALBUMIN:2 in the last 168 hours No results found for this basename: LIPASE:2,AMYLASE:2 in the last 168 hours No results found for this basename: AMMONIA:2 in the last 168 hours CBC:  Lab 06/01/12 0615 05/31/12 1031  WBC 6.3 6.4  NEUTROABS -- --  HGB 10.6* 11.9*  HCT 32.7* 37.3  MCV 87.4 87.4  PLT 241 195   CBG:  Lab 06/03/12 1159 06/03/12 0745 06/02/12 2051 06/02/12 1656 06/02/12 1149  GLUCAP 156* 153* 155* 164* 88      Studies/Results: Dg Abd 1 View  05/31/2012  *RADIOLOGY REPORT*  Clinical Data: Nasogastric tube placement with fluoroscopic guidance by the fluoroscopic  technologist.  ABDOMEN - 1 VIEW  Comparison: Earlier today.  Findings: Interval nasogastric tube with its tip in the mid stomach.  Lumbar spine degenerative changes.  IMPRESSION: Nasogastric tube tip in the mid stomach.  Original Report Authenticated By: Darrol Angel, M.D.   Ct Abdomen Pelvis W Contrast  05/28/2012  *RADIOLOGY REPORT*  Clinical Data: Abdominal pain  CT ABDOMEN AND PELVIS WITH CONTRAST  Technique:  Multidetector CT imaging of the abdomen and pelvis was performed following the standard protocol during bolus administration of intravenous contrast.  Contrast: OMNIPAQUE IOHEXOL 300 MG/ML  SOLN  Comparison: 03/25/2012  Findings: Tiny bilateral pleural effusions.  Bibasilar atelectasis.  Cardiomegaly  Hiatal hernia  The stomach and several small bowel loops are distended.  Distal ileum is relatively decompressed.  The transition point is in the right lower quadrant without conspicuous cause.  Small amount of free fluid is seen in the right paracolic gutter. Free fluid is seen layering in the pelvis.  No loculated abscess.  Diverticulosis of the descending colon is noted.  There is noticeable stranding in the fat adjacent to the descending colon as well as in the omentum.  Peritoneal carcinomatosis has a similar appearance.  This may represent edema or inflammatory changes.  Benign appearing renal cysts.  Pancreatic atrophy.  Pancreatic and common bile duct are prominent but this might simply reflect bowel obstruction.  Duodenal diverticuli are present.  Nonspecific hypodensity at the dome  of the liver is stable.  IMPRESSION: Small bowel obstruction pattern.  The transition point is in the distal ileum.  There is stranding within the fat adjacent to the descending colon and omentum.  This might reflect edema, inflammatory changes, or peritoneal carcinomatosis.  Colonic diverticulosis is associated.  Original Report Authenticated By: Donavan Burnet, M.D.   Dg Abd 2 Views  06/01/2012  *RADIOLOGY  REPORT*  Clinical Data: Small bowel obstruction  ABDOMEN - 2 VIEW  Findings:  An enteric tube overlies the left upper abdominal quadrant, tip overlying the expected location of the gastric antrum. There is an overall paucity of bowel gas.  Moderate colonic stool burden.  No pneumoperitoneum, pneumatosis or portal venous gas.  Limited visualization lower thorax demonstrate small bilateral effusions and bibasilar heterogeneous / consolidative opacities.  Lumbar spine and bilateral hip degenerative change, right greater than left.  Right-sided SI joint degenerative change.  Vascular calcifications.  IMPRESSION: 1.  Paucity of bowel gas without definite evidence of obstruction.  2. Small bilateral pleural effusions and basilar heterogeneous opacities, atelectasis versus infiltrate.  Further evaluation with dedicated chest radiograph may be performed as clinically indicated.  This was made a call report.  Original Report Authenticated By: Waynard Reeds, M.D.   Dg Abd 2 Views  05/30/2012  *RADIOLOGY REPORT*  Clinical Data: Abdominal discomfort.  Distention.  ABDOMEN - 2 VIEW  Comparison: CT abdomen pelvis 05/28/2012 and abdominal radiograph 05/28/2012.  Findings: There is a relative paucity of gas in the abdomen. Dilated small bowel loops and air-fluid levels are seen in the right upper quadrant.  Curvilinear calcification in the left abdomen corresponds to the wall of a low attenuation lesion off the lower pole left kidney, better seen on 05/28/2012.  Degenerative changes are seen in the spine, sacroiliac joints, hips and symphysis pubis.  Incidental imaging of the chest shows a layering left pleural effusion.  IMPRESSION:  Relative paucity of gas in the abdomen with mildly dilated loops of small bowel in the right upper quadrant and associated air fluid levels, indicating a persistent obstructive pattern.  Original Report Authenticated By: Reyes Ivan, M.D.   Dg Abd 2 Views  05/28/2012  *RADIOLOGY REPORT*   Clinical Data: 78 year old female with abdominal pain and distention.  Possible small bowel obstruction.  ABDOMEN - 2 VIEW  Comparison: 05/27/2012 and prior abdominal radiographs  Findings: Gas and fluid filled small bowel loops are again identified with the dilated loop in the left lower abdomen/pelvis now not visualized. A small amount of gas in the colon is noted. There is no evidence of pneumoperitoneum. Bibasilar atelectasis and degenerative changes in the lumbar spine and hips are again noted.  IMPRESSION: Nonspecific bowel gas pattern again identified without pneumoperitoneum. Apparent slight improvement of distended bowel loops in the left lower abdomen. Small bowel obstruction is not excluded given these findings.  Original Report Authenticated By: Rosendo Gros, M.D.   Dg Abd 2 Views  05/27/2012  *RADIOLOGY REPORT*  Clinical Data: Altered mental status.  Question free intraperitoneal air.  ABDOMEN - 2 VIEW  Comparison: Two views the abdomen 04/27/2012.  Findings: No free intraperitoneal air is identified.  There are gas filled and mildly dilated loops of small bowel measuring up to 3.4 cm in diameter with air-fluid levels present.  Lumbar scoliosis and degenerative change are noted.  Degenerative disease is also present about the hips, worse on the right.  IMPRESSION:  1.  Findings worrisome for small bowel obstruction. 2.  Negative for free intraperitoneal air.  Original Report Authenticated By: Bernadene Bell. Maricela Curet, M.D.   Dg Abd Portable 2v  05/31/2012  *RADIOLOGY REPORT*  Clinical Data: Vomiting, possible small bowel obstruction.  PORTABLE ABDOMEN - 2 VIEW  Comparison: 05/30/2012  Findings: No interval change in bowel gas pattern.  Relative paucity of small bowel gas in keeping with known fluid filled loops.  Air within mildly dilated bowel loops of the right upper quadrant, similar to prior. The lung bases/hemidiaphragms are essentially excluded from image.  Degenerative changes of the lumbar  spine and SI joints.  IMPRESSION: No significant change in the fluid-filled small bowel obstruction pattern.  Original Report Authenticated By: Waneta Martins, M.D.   Dg Naso G Tube Plc W/fl-no Rad  05/31/2012  CLINICAL DATA: SBO, unable to place tube at bedside   NASO G TUBE PLACEMENT WITH FLUORO  Fluoroscopy was utilized by the requesting physician.  No radiographic  interpretation.      Medications: Scheduled Meds:    . antiseptic oral rinse  15 mL Mouth Rinse q12n4p  . bisacodyl  10 mg Rectal Daily  . chlorhexidine  15 mL Mouth Rinse BID  . donepezil  5 mg Oral Daily  . feeding supplement  1 Container Oral TID BM  . heparin  5,000 Units Subcutaneous Q8H  . insulin aspart  0-15 Units Subcutaneous TID WC  . levothyroxine  50 mcg Oral QAC breakfast  . pantoprazole (PROTONIX) IV  40 mg Intravenous Q24H  . Travoprost (BAK Free)  1 drop Both Eyes QHS  . DISCONTD: levothyroxine  26 mcg Intravenous Daily  . DISCONTD: levothyroxine  25 mcg Oral QAC breakfast   Continuous Infusions:    . dextrose 5 % and 0.45 % NaCl with KCl 20 mEq/L 60 mL/hr at 06/03/12 9604     Assessment/Plan: Principal Problem:  *SBO (small bowel obstruction) - NG clamped since yesterday, tolerating clears.  - Abdominal x-ray obtained this morning with persistent loops and air-fluid levels c/w partial mechanical SBO. Will await surgery recommendations.  Active Problems:  AKI (acute kidney injury): Resolved, likely prerenal, continue gentle hydration - Recheck BMET in a.m.    DM (diabetes mellitus), type 2 with complications: - Continue sliding scale insulin, no adjustments until tolerating diet    Hypertension: Stable    Hypothyroidism: - Continue IV Synthroid, TSH 3.9  DVT Prophylaxis:Heparin subcutaneous   Code Status:  Disposition:Not medically ready   LOS: 7 days   RAI,RIPUDEEP M.D. [facility] 06/03/2012, 1:39 PM Pager: [phone]  If 7PM-7AM, please contact  night-coverage [email or web address] Password [email or web address]

## 2012-06-03 NOTE — Progress Notes (Signed)
Patient came back from PACU. Report taken over from PACU nurse. Patient awake,conscious and coherent, not in any form of distress. Connections checked. Initial assessment done, vital signs checked and recorded. Noted for midline abdomen surgical dressing, no active bleeding from site noted. Will continue to monitor.

## 2012-06-03 NOTE — Anesthesia Postprocedure Evaluation (Signed)
Anesthesia Post Note  Patient: Megan [name]  Procedure(s) Performed: Procedure(s) (LRB): EXPLORATORY LAPAROTOMY (N/A)  Anesthesia type: general  Patient location: PACU  Post pain: Pain level controlled  Post assessment: Patient's Cardiovascular Status Stable  Last Vitals:  Filed Vitals:   06/03/12 1900  BP:   Pulse:   Temp: 36.8 C  Resp:     Post vital signs: Reviewed and stable  Level of consciousness: sedated  Complications: No apparent anesthesia complications

## 2012-06-03 NOTE — Progress Notes (Signed)
Referred to on-call MD if we have to re-connect patient to intermittent low wall suction via NGT post surgery, responded that intermittent low suction via NG should be resumed post surgery. Patient also placed on cardiac monitor per order. Noted for CVC double lumen to her right subclavian, however no chest x-ray was done to confirm placement. CVC not in use at the moment, Infusion going through peripheral line. Will continue to monitor

## 2012-06-03 NOTE — Transfer of Care (Signed)
Immediate Anesthesia Transfer of Care Note  Patient: Megan [name]  Procedure(s) Performed: Procedure(s) (LRB): EXPLORATORY LAPAROTOMY (N/A)  Patient Location: PACU  Anesthesia Type: General  Level of Consciousness: awake  Airway & Oxygen Therapy: Patient Spontanous Breathing and Patient connected to face mask oxygen  Post-op Assessment: Report given to PACU RN and Post -op Vital signs reviewed and stable  Post vital signs: Reviewed and stable  Complications: No apparent anesthesia complications

## 2012-06-03 NOTE — Progress Notes (Signed)
Spoke with dr. Michelle Piper regarding chest xray for line placement of r svc cvc. He did not wish to do xray.

## 2012-06-03 NOTE — Preoperative (Signed)
Beta Blockers   Reason not to administer Beta Blockers:Not Applicable 

## 2012-06-03 NOTE — Progress Notes (Signed)
Agree with NP note above.  Mariella Saa MD, FACS  06/03/2012 2:17 PM

## 2012-06-03 NOTE — Op Note (Signed)
Preoperative Diagnosis: Small Bowel Obstruction  Postoprative Diagnosis: Same secondary to adhesions  Procedure: Procedure(s): EXPLORATORY LAPAROTOMY & Lysis of Adhesions   Surgeon: Glenna Fellows T   Assistants: Frederik Schmidt M.D.  Anesthesia:  General endotracheal anesthesiaDiagnos  Indications:   Patient is an 78 year old female with a previous history of laparoscopic closure of perforated duodenal ulcer. She presents with small bowel obstruction confirmed on CT scan and several days of NG suction and bowel rest has not improved and actually today has increased distention of proximal small bowel loops. I therefore recommended proceeding with exploratory laparotomy for small bowel obstruction. I discussed the indications for the procedure, its nature, and risks of bleeding, infection, anesthetic complications, cardiorespiratory problems and intestinal injury with the patient and her daughter and they agreed to proceed.  Procedure Detail:  Patient is brought to the operating room, placed in the supine position on the operating table, and general endotracheal anesthesia induced. Foley catheter was placed. She had been given broad-spectrum IV antibiotics preoperatively. The abdomen was widely sterilely prepped and draped. Patient timeout was performed and correct procedure verified. A midline incision skirting the umbilicus was used to dissect carried down through the subcutaneous tissue and midline fascia. The peritoneum was entered under direct vision. There were filmy adhesions of the omentum and bowel to the anterior abdominal wall which were taken down with blunt and sharp dissection. There were obviously dilated loops of proximal small bowel. The ligament of Treitz was identified and the small intestine and traced distally. In the ileum were 2 fairly dense bands of scar tissue down to the retroperitoneum that were draped over the bowel with obvious points of obstruction. These were sharply  lysed and the distal bowel was very decompressed and was traced all the way down to the ileocecal valve with no other abnormalities found. The bowel was again run along its entire length at this point was completely free. The other was copiously irrigated with warm saline. Hemostasis was assured. The viscera were returned to their anatomic position and the midline fascia closed with running looped #1 PDS begun at either end of the incision and tied centrally. The subcutaneous tissue was irrigated and the skin closed with staples. Sponge needle and instrument counts were correct.  Findings: Small bowel obstruction secondary to adhesive band  Estimated Blood Loss:  Minimal         Drains: none  Blood Given: none          Specimens: none        Complications:  * No complications entered in OR log *         Disposition: PACU - hemodynamically stable.         Condition: stable  Mariella Saa MD, FACS  06/03/2012, 6:47 PM

## 2012-06-04 ENCOUNTER — Inpatient Hospital Stay (HOSPITAL_COMMUNITY): Payer: Medicare Other

## 2012-06-04 DIAGNOSIS — M129 Arthropathy, unspecified: Secondary | ICD-10-CM

## 2012-06-04 LAB — GLUCOSE, CAPILLARY: Glucose-Capillary: 184 mg/dL — ABNORMAL HIGH (ref 70–99)

## 2012-06-04 LAB — BASIC METABOLIC PANEL
CO2: 27 mEq/L (ref 19–32)
Calcium: 8.3 mg/dL — ABNORMAL LOW (ref 8.4–10.5)
GFR calc Af Amer: >90 mL/min (ref >90)
GFR calc non Af Amer: 80 mL/min — ABNORMAL LOW (ref >90)
Sodium: 137 mEq/L (ref 135–145)

## 2012-06-04 LAB — CBC
MCV: 85.2 fL (ref 78.0–100.0)
Platelets: 252 10*3/uL (ref 150–400)
RBC: 4.33 MIL/uL (ref 3.87–5.11)
RDW: 15.3 % (ref 11.5–15.5)
WBC: 16 10*3/uL — ABNORMAL HIGH (ref 4.0–10.5)

## 2012-06-04 MED ORDER — POTASSIUM CHLORIDE 10 MEQ/100ML IV SOLN
10.0000 meq | INTRAVENOUS | Status: AC
  Administered 2012-06-04 (×3): 10 meq via INTRAVENOUS
  Filled 2012-06-04 (×3): qty 100

## 2012-06-04 MED ORDER — PHENOL 1.4 % MT LIQD
1.0000 | OROMUCOSAL | Status: DC | PRN
  Administered 2012-06-04 – 2012-06-06 (×2): 1 via OROMUCOSAL
  Filled 2012-06-04: qty 177

## 2012-06-04 MED ORDER — SODIUM CHLORIDE 0.9 % IJ SOLN
10.0000 mL | INTRAMUSCULAR | Status: DC | PRN
  Administered 2012-06-04: 20 mL
  Administered 2012-06-05 – 2012-06-11 (×7): 10 mL

## 2012-06-04 MED ORDER — INSULIN ASPART 100 UNIT/ML ~~LOC~~ SOLN
0.0000 [IU] | SUBCUTANEOUS | Status: DC
  Administered 2012-06-04: 2 [IU] via SUBCUTANEOUS
  Administered 2012-06-04: 1 [IU] via SUBCUTANEOUS
  Administered 2012-06-04: 2 [IU] via SUBCUTANEOUS
  Administered 2012-06-05 (×3): 1 [IU] via SUBCUTANEOUS
  Administered 2012-06-05: 2 [IU] via SUBCUTANEOUS
  Administered 2012-06-06: 1 [IU] via SUBCUTANEOUS
  Administered 2012-06-06: 2 [IU] via SUBCUTANEOUS
  Administered 2012-06-06 – 2012-06-07 (×3): 1 [IU] via SUBCUTANEOUS
  Administered 2012-06-07: 2 [IU] via SUBCUTANEOUS
  Administered 2012-06-07 – 2012-06-10 (×6): 1 [IU] via SUBCUTANEOUS

## 2012-06-04 NOTE — Progress Notes (Signed)
Patient ID: Megan [name], female   DOB: [birth year], 78 y.o.   MRN: [id number] 1 Day Post-Op  Subjective: C/o sore throat only  Objective: Vital signs in last 24 hours: Temp:  [97 F (36.1 C)-99.3 F (37.4 C)] 98 F (36.7 C) (08/03 0800) Pulse Rate:  [71-109] 108  (08/03 0800) Resp:  [16-18] 17  (08/03 0800) BP: (131-169)/(58-95) 143/64 mmHg (08/03 0800) SpO2:  [95 %-100 %] 95 % (08/03 0800) Weight:  [138 lb 3.7 oz (62.7 kg)] 138 lb 3.7 oz (62.7 kg) (08/02 2215) Last BM Date: 06/02/12  Intake/Output from previous day: 08/02 0701 - 08/03 0700 In: 1500 [I.V.:1000; IV Piggyback:500] Out: 1150 [Urine:1100; Blood:50] Intake/Output this shift: Total I/O In: 0  Out: 200 [Emesis/NG output:200]  General appearance: alert, cooperative and no distress GI: abnormal findings:  mild tenderness around incision Incision/Wound: Dressing clean and dry  Lab Results:   Basename 06/04/12 0457 06/03/12 1556  WBC 16.0* 10.8*  HGB 12.6 12.5  HCT 36.9 38.0  PLT 252 211   BMET  Basename 06/04/12 0457 06/03/12 1556  NA 137 137  K 3.2* 3.3*  CL 101 101  CO2 27 28  GLUCOSE 157* 68*  BUN 4* 3*  CREATININE 0.65 0.63  CALCIUM 8.3* 9.1     Studies/Results: Dg Chest Port 1 View  06/04/2012  *RADIOLOGY REPORT*  Clinical Data: Confirms CBC placement.  PORTABLE CHEST - 1 VIEW  Comparison: 04/25/2012  Findings: Shallow inspiration.  Mild cardiac enlargement with normal pulmonary vascularity.  Small left pleural effusion with basilar atelectasis.  Calcified and tortuous aorta.  Since the previous study, there has been interval placement of an enteric tube.  The tip is not visible but is below the left hemidiaphragm consistent with location at least in the stomach.  A right central venous catheter has been placed with tip projected over the expected location of the SVC / RA junction.  No pneumothorax. Degenerative changes in the spine and shoulders.  IMPRESSION: Appliances positioned as described.   Mild cardiac enlargement. Small left pleural effusion and basilar atelectasis.  Original Report Authenticated By: Marlon Pel, M.D.   Dg Abd 2 Views  06/03/2012  *RADIOLOGY REPORT*  Clinical Data: History of small-bowel obstruction.  Follow-up.  ABDOMEN - 2 VIEW  Comparison: 06/01/2012. CT 05/28/2012.  Findings: Tip of enteric tube terminates in the area of the body of the stomach.  Multiple dilated loops of small intestine are seen. Multiple dilated loops of small intestine are again evident.  The loops appear more distended than on the previous study.  A loop of small intestine in the left mid abdomen is dilated up to caliber of 3.9 cm.  There is paucity of colonic gas.  There is moderate scoliosis convexity to the right.  Osteophytes are present in the spine.  There is narrowing of right hip joint space. Osteoarthritic changes are seen.  There is blunting of the left costophrenic angle consistent with small joint effusion. No opaque calculi are evident.  IMPRESSION: Enteric tube in place terminating in the area of the distal body of the stomach.  Dilated loops of small intestine with air-fluid levels on upright examination.  The degree of distention has increased in some of the loops since the previous study.  Findings are consistent with partial mechanical small-bowel obstruction.  No evidence of pneumoperitoneum,  Small left pleural effusion.  No pneumoperitoneum evident.  Original Report Authenticated By: Crawford Givens, M.D.    Anti-infectives: Anti-infectives     Start  Dose/Rate Route Frequency Ordered Stop   06/03/12 1600   ertapenem (INVANZ) 1 g in sodium chloride 0.9 % 50 mL IVPB        1 g 100 mL/hr over 30 Minutes Intravenous  Once 06/03/12 1552 06/03/12 1743          Assessment/Plan: s/p Procedure(s): EXPLORATORY LAPAROTOMY Stable post op Cont NG OOB   LOS: 8 days    Clinton Dragone T 06/04/2012

## 2012-06-04 NOTE — Progress Notes (Signed)
[id number] Encouraged to get OOB pt claims she feels so tired today ; sleepy but arousable; c/o sorethroat med given.

## 2012-06-04 NOTE — Progress Notes (Addendum)
Patient ID: Megan [name]  female  JYN:[id number]    DOB: 1926-12-21    DOA: 05/27/2012  PCP: [name], MD  Subjective: Somewhat lethargic but arousable, denies any pain, fever or chills, NG to IWS, s/p ex lap last night  Objective: Weight change: -2.2 kg (-4 lb 13.6 oz)  Intake/Output Summary (Last 24 hours) at 06/04/12 1058 Last data filed at 06/04/12 0810  Gross per 24 hour  Intake   1500 ml  Output   1350 ml  Net    150 ml   Blood pressure 131/95, pulse 109, temperature 98.1 F (36.7 C), temperature source Axillary, resp. rate 18, height 5\' 7"  (1.702 m), weight 62.7 kg (138 lb 3.7 oz), SpO2 98.00%.  Physical Exam: General: somewhat lethargic but arousable, not in any acute distress. NG  HEENT: anicteric sclera, pupils reactive to light and accommodation, EOMI CVS: S1-S2 clear, no murmur rubs or gallops Chest: clear to auscultation bilaterally, no wheezing, rales or rhonchi Abdomen: dressing intact Extremities: no cyanosis, clubbing or edema noted bilaterally  Lab Results: Basic Metabolic Panel:  Lab 06/04/12 8657 06/03/12 1556  NA 137 137  K 3.2* 3.3*  CL 101 101  CO2 27 28  GLUCOSE 157* 68*  BUN 4* 3*  CREATININE 0.65 0.63  CALCIUM 8.3* 9.1  MG -- --  PHOS -- --   CBC:  Lab 06/04/12 0457 06/03/12 1556  WBC 16.0* 10.8*  NEUTROABS -- --  HGB 12.6 12.5  HCT 36.9 38.0  MCV 85.2 84.3  PLT 252 211   CBG:  Lab 06/04/12 0757 06/03/12 2143 06/03/12 1909 06/03/12 1642 06/03/12 1617  GLUCAP 184* 108* 70 136* 56*      Studies/Results: Dg Abd 1 View  05/31/2012  *RADIOLOGY REPORT*  Clinical Data: Nasogastric tube placement with fluoroscopic guidance by the fluoroscopic technologist.  ABDOMEN - 1 VIEW  Comparison: Earlier today.  Findings: Interval nasogastric tube with its tip in the mid stomach.  Lumbar spine degenerative changes.  IMPRESSION: Nasogastric tube tip in the mid stomach.  Original Report Authenticated By: Darrol Angel, M.D.   Ct  Abdomen Pelvis W Contrast  05/28/2012  *RADIOLOGY REPORT*  Clinical Data: Abdominal pain  CT ABDOMEN AND PELVIS WITH CONTRAST  Technique:  Multidetector CT imaging of the abdomen and pelvis was performed following the standard protocol during bolus administration of intravenous contrast.  Contrast: OMNIPAQUE IOHEXOL 300 MG/ML  SOLN  Comparison: 03/25/2012  Findings: Tiny bilateral pleural effusions.  Bibasilar atelectasis.  Cardiomegaly  Hiatal hernia  The stomach and several small bowel loops are distended.  Distal ileum is relatively decompressed.  The transition point is in the right lower quadrant without conspicuous cause.  Small amount of free fluid is seen in the right paracolic gutter. Free fluid is seen layering in the pelvis.  No loculated abscess.  Diverticulosis of the descending colon is noted.  There is noticeable stranding in the fat adjacent to the descending colon as well as in the omentum.  Peritoneal carcinomatosis has a similar appearance.  This may represent edema or inflammatory changes.  Benign appearing renal cysts.  Pancreatic atrophy.  Pancreatic and common bile duct are prominent but this might simply reflect bowel obstruction.  Duodenal diverticuli are present.  Nonspecific hypodensity at the dome of the liver is stable.  IMPRESSION: Small bowel obstruction pattern.  The transition point is in the distal ileum.  There is stranding within the fat adjacent to the descending colon and omentum.  This might reflect  edema, inflammatory changes, or peritoneal carcinomatosis.  Colonic diverticulosis is associated.  Original Report Authenticated By: Donavan Burnet, M.D.   Dg Abd 2 Views  06/01/2012  *RADIOLOGY REPORT*  Clinical Data: Small bowel obstruction  ABDOMEN - 2 VIEW  Findings:  An enteric tube overlies the left upper abdominal quadrant, tip overlying the expected location of the gastric antrum. There is an overall paucity of bowel gas.  Moderate colonic stool burden.  No  pneumoperitoneum, pneumatosis or portal venous gas.  Limited visualization lower thorax demonstrate small bilateral effusions and bibasilar heterogeneous / consolidative opacities.  Lumbar spine and bilateral hip degenerative change, right greater than left.  Right-sided SI joint degenerative change.  Vascular calcifications.  IMPRESSION: 1.  Paucity of bowel gas without definite evidence of obstruction.  2. Small bilateral pleural effusions and basilar heterogeneous opacities, atelectasis versus infiltrate.  Further evaluation with dedicated chest radiograph may be performed as clinically indicated.  This was made a call report.  Original Report Authenticated By: Waynard Reeds, M.D.   Dg Abd 2 Views  05/30/2012  *RADIOLOGY REPORT*  Clinical Data: Abdominal discomfort.  Distention.  ABDOMEN - 2 VIEW  Comparison: CT abdomen pelvis 05/28/2012 and abdominal radiograph 05/28/2012.  Findings: There is a relative paucity of gas in the abdomen. Dilated small bowel loops and air-fluid levels are seen in the right upper quadrant.  Curvilinear calcification in the left abdomen corresponds to the wall of a low attenuation lesion off the lower pole left kidney, better seen on 05/28/2012.  Degenerative changes are seen in the spine, sacroiliac joints, hips and symphysis pubis.  Incidental imaging of the chest shows a layering left pleural effusion.  IMPRESSION:  Relative paucity of gas in the abdomen with mildly dilated loops of small bowel in the right upper quadrant and associated air fluid levels, indicating a persistent obstructive pattern.  Original Report Authenticated By: Reyes Ivan, M.D.   Dg Abd 2 Views  05/28/2012  *RADIOLOGY REPORT*  Clinical Data: 78 year old female with abdominal pain and distention.  Possible small bowel obstruction.  ABDOMEN - 2 VIEW  Comparison: 05/27/2012 and prior abdominal radiographs  Findings: Gas and fluid filled small bowel loops are again identified with the dilated loop in  the left lower abdomen/pelvis now not visualized. A small amount of gas in the colon is noted. There is no evidence of pneumoperitoneum. Bibasilar atelectasis and degenerative changes in the lumbar spine and hips are again noted.  IMPRESSION: Nonspecific bowel gas pattern again identified without pneumoperitoneum. Apparent slight improvement of distended bowel loops in the left lower abdomen. Small bowel obstruction is not excluded given these findings.  Original Report Authenticated By: Rosendo Gros, M.D.   Dg Abd 2 Views  05/27/2012  *RADIOLOGY REPORT*  Clinical Data: Altered mental status.  Question free intraperitoneal air.  ABDOMEN - 2 VIEW  Comparison: Two views the abdomen 04/27/2012.  Findings: No free intraperitoneal air is identified.  There are gas filled and mildly dilated loops of small bowel measuring up to 3.4 cm in diameter with air-fluid levels present.  Lumbar scoliosis and degenerative change are noted.  Degenerative disease is also present about the hips, worse on the right.  IMPRESSION:  1.  Findings worrisome for small bowel obstruction. 2.  Negative for free intraperitoneal air.  Original Report Authenticated By: Bernadene Bell. Maricela Curet, M.D.   Dg Abd Portable 2v  05/31/2012  *RADIOLOGY REPORT*  Clinical Data: Vomiting, possible small bowel obstruction.  PORTABLE ABDOMEN -  2 VIEW  Comparison: 05/30/2012  Findings: No interval change in bowel gas pattern.  Relative paucity of small bowel gas in keeping with known fluid filled loops.  Air within mildly dilated bowel loops of the right upper quadrant, similar to prior. The lung bases/hemidiaphragms are essentially excluded from image.  Degenerative changes of the lumbar spine and SI joints.  IMPRESSION: No significant change in the fluid-filled small bowel obstruction pattern.  Original Report Authenticated By: Waneta Martins, M.D.   Dg Naso G Tube Plc W/fl-no Rad  05/31/2012  CLINICAL DATA: SBO, unable to place tube at bedside   NASO G  TUBE PLACEMENT WITH FLUORO  Fluoroscopy was utilized by the requesting physician.  No radiographic  interpretation.      Medications: Scheduled Meds:    . antiseptic oral rinse  15 mL Mouth Rinse q12n4p  . chlorhexidine  15 mL Mouth Rinse BID  . dextrose      . ertapenem  1 g Intravenous Once  . heparin  5,000 Units Subcutaneous Q8H  . insulin aspart  0-15 Units Subcutaneous TID WC  . levothyroxine  26 mcg Intravenous Daily  . pantoprazole (PROTONIX) IV  40 mg Intravenous Q24H  . Travoprost (BAK Free)  1 drop Both Eyes QHS  . DISCONTD: bisacodyl  10 mg Rectal Daily  . DISCONTD: donepezil  5 mg Oral Daily  . DISCONTD: feeding supplement  1 Container Oral TID BM  . DISCONTD: levothyroxine  25 mcg Oral QAC breakfast  . DISCONTD: levothyroxine  50 mcg Oral QAC breakfast   Continuous Infusions:    . dextrose 5 % and 0.45 % NaCl with KCl 20 mEq/L 100 mL/hr at 06/04/12 0630  . DISCONTD: dextrose 5 % and 0.45 % NaCl with KCl 20 mEq/L 60 mL/hr at 06/03/12 1610     Assessment/Plan: Principal Problem:  *SBO (small bowel obstruction) no significant improvement: status post surgery postop day 1 - s/p an exploratory laparotomy, SBO secondary to adhesive band -Further management per [facility] surgery  Hypokalemia: Replaced IV   AKI (acute kidney injury): Resolved, likely prerenal, continue gentle hydration - Recheck BMET in a.m.    DM (diabetes mellitus), type 2 with complications: - Changed CBGs q4 hours, continue sliding scale insulin   Hypertension: Stable    Hypothyroidism: - Continue IV Synthroid, TSH 3.9  Leukocytosis: Possibly stress leukocytosis/demargination, afebrile - If patient develops any fevers, will obtain blood cultures, may need antibiotics, follow closely  DVT Prophylaxis:Heparin subcutaneous   Code Status:  Discussed with Dr. Johna Sheriff ([facility]) this morning, patient is status post exploratory laparotomy, postop day 1, [facility] will now assume care as primary service, [facility]  will remain as consult for medical issues.   LOS: 8 days   Minha Fulco M.D. [facility] 06/04/2012, 10:58 AM Pager: [phone]  If 7PM-7AM, please contact night-coverage [email or web address] Password [email or web address]

## 2012-06-05 DIAGNOSIS — I519 Heart disease, unspecified: Secondary | ICD-10-CM

## 2012-06-05 DIAGNOSIS — J9600 Acute respiratory failure, unspecified whether with hypoxia or hypercapnia: Secondary | ICD-10-CM

## 2012-06-05 LAB — GLUCOSE, CAPILLARY
Glucose-Capillary: 137 mg/dL — ABNORMAL HIGH (ref 70–99)
Glucose-Capillary: 158 mg/dL — ABNORMAL HIGH (ref 70–99)

## 2012-06-05 MED ORDER — POTASSIUM CHLORIDE 10 MEQ/100ML IV SOLN
10.0000 meq | INTRAVENOUS | Status: AC
  Administered 2012-06-05 (×3): 10 meq via INTRAVENOUS
  Filled 2012-06-05 (×3): qty 100

## 2012-06-05 NOTE — Progress Notes (Signed)
PT Cancellation Note  Treatment cancelled today due to patient's refusal to participate.  Will reattempt PT eval tomorrow;   Van Clines [facility] 06/05/2012, 5:00 PM

## 2012-06-05 NOTE — Progress Notes (Signed)
Patient ID: Megan [name], female   DOB: 1927-09-12, 78 y.o.   MRN: [id number] 2 Days Post-Op  Subjective: NGT is main C/O, sore throat.  No flatus or BM  Objective: Vital signs in last 24 hours: Temp:  [98.4 F (36.9 C)-99.5 F (37.5 C)] 98.4 F (36.9 C) (08/04 0555) Pulse Rate:  [93-111] 93  (08/04 0555) Resp:  [18] 18  (08/04 0555) BP: (145-170)/(60-76) 157/69 mmHg (08/04 0555) SpO2:  [92 %-98 %] 94 % (08/04 0555) Weight:  [137 lb 12.6 oz (62.5 kg)] 137 lb 12.6 oz (62.5 kg) (08/03 2035) Last BM Date: 06/02/12  Intake/Output from previous day: 08/03 0701 - 08/04 0700 In: 3271.7 [I.V.:3261.7; IV Piggyback:10] Out: 725 [Urine:525; Emesis/NG output:200] Intake/Output this shift:    General appearance: alert and no distress GI: normal findings: soft, non-tender Incision/Wound: Dressing clean and dry  Lab Results:   Basename 06/04/12 0457 06/03/12 1556  WBC 16.0* 10.8*  HGB 12.6 12.5  HCT 36.9 38.0  PLT 252 211   BMET  Basename 06/04/12 0457 06/03/12 1556  NA 137 137  K 3.2* 3.3*  CL 101 101  CO2 27 28  GLUCOSE 157* 68*  BUN 4* 3*  CREATININE 0.65 0.63  CALCIUM 8.3* 9.1     Studies/Results: Dg Chest Port 1 View  06/04/2012  *RADIOLOGY REPORT*  Clinical Data: Confirms CBC placement.  PORTABLE CHEST - 1 VIEW  Comparison: 04/25/2012  Findings: Shallow inspiration.  Mild cardiac enlargement with normal pulmonary vascularity.  Small left pleural effusion with basilar atelectasis.  Calcified and tortuous aorta.  Since the previous study, there has been interval placement of an enteric tube.  The tip is not visible but is below the left hemidiaphragm consistent with location at least in the stomach.  A right central venous catheter has been placed with tip projected over the expected location of the SVC / RA junction.  No pneumothorax. Degenerative changes in the spine and shoulders.  IMPRESSION: Appliances positioned as described.  Mild cardiac enlargement. Small left  pleural effusion and basilar atelectasis.  Original Report Authenticated By: Marlon Pel, M.D.   Dg Abd 2 Views  06/03/2012  *RADIOLOGY REPORT*  Clinical Data: History of small-bowel obstruction.  Follow-up.  ABDOMEN - 2 VIEW  Comparison: 06/01/2012. CT 05/28/2012.  Findings: Tip of enteric tube terminates in the area of the body of the stomach.  Multiple dilated loops of small intestine are seen. Multiple dilated loops of small intestine are again evident.  The loops appear more distended than on the previous study.  A loop of small intestine in the left mid abdomen is dilated up to caliber of 3.9 cm.  There is paucity of colonic gas.  There is moderate scoliosis convexity to the right.  Osteophytes are present in the spine.  There is narrowing of right hip joint space. Osteoarthritic changes are seen.  There is blunting of the left costophrenic angle consistent with small joint effusion. No opaque calculi are evident.  IMPRESSION: Enteric tube in place terminating in the area of the distal body of the stomach.  Dilated loops of small intestine with air-fluid levels on upright examination.  The degree of distention has increased in some of the loops since the previous study.  Findings are consistent with partial mechanical small-bowel obstruction.  No evidence of pneumoperitoneum,  Small left pleural effusion.  No pneumoperitoneum evident.  Original Report Authenticated By: Crawford Givens, M.D.    Anti-infectives: Anti-infectives     Start  Dose/Rate Route Frequency Ordered Stop   06/03/12 1600   ertapenem (INVANZ) 1 g in sodium chloride 0.9 % 50 mL IVPB        1 g 100 mL/hr over 30 Minutes Intravenous  Once 06/03/12 1552 06/03/12 1743          Assessment/Plan: s/p Procedure(s): EXPLORATORY LAPAROTOMY Stable post op Expected ileus-cont NG for now Needs mobilization   LOS: 9 days    [name] 06/05/2012

## 2012-06-05 NOTE — Progress Notes (Signed)
Patient ID: Megan [name]  female  ZOX:[id number]    DOB: 1926-11-04    DOA: 05/27/2012  PCP: [name], MD  Medicine Consult f/u progress note:  Subjective: Status post exploratory laparotomy, postop day2, burping but no nausea vomiting, no flatus or BM  Objective: Weight change: -0.2 kg (-7.1 oz)  Intake/Output Summary (Last 24 hours) at 06/05/12 1110 Last data filed at 06/05/12 0700  Gross per 24 hour  Intake 3271.66 ml  Output    525 ml  Net 2746.66 ml   Blood pressure 148/57, pulse 91, temperature 98.6 F (37 C), temperature source Oral, resp. rate 18, height 5\' 7"  (1.702 m), weight 62.5 kg (137 lb 12.6 oz), SpO2 98.00%.  Physical Exam: General: somewhat lethargic but arousable, not in any acute distress. NG  HEENT: anicteric sclera, pupils reactive to light and accommodation, EOMI CVS: S1-S2 clear, no murmur rubs or gallops Chest: clear to auscultation bilaterally, no wheezing, rales or rhonchi Abdomen: dressing intact Extremities: no cyanosis, clubbing or edema noted bilaterally  Lab Results: Basic Metabolic Panel:  Lab 06/04/12 8119 06/03/12 1556  NA 137 137  K 3.2* 3.3*  CL 101 101  CO2 27 28  GLUCOSE 157* 68*  BUN 4* 3*  CREATININE 0.65 0.63  CALCIUM 8.3* 9.1  MG -- --  PHOS -- --   CBC:  Lab 06/04/12 0457 06/03/12 1556  WBC 16.0* 10.8*  NEUTROABS -- --  HGB 12.6 12.5  HCT 36.9 38.0  MCV 85.2 84.3  PLT 252 211   CBG:  Lab 06/05/12 0734 06/05/12 0401 06/05/12 0015 06/04/12 2040 06/04/12 1704  GLUCAP 149* 137* 137* 143* 173*      Studies/Results: Dg Abd 1 View  05/31/2012  *RADIOLOGY REPORT*  Clinical Data: Nasogastric tube placement with fluoroscopic guidance by the fluoroscopic technologist.  ABDOMEN - 1 VIEW  Comparison: Earlier today.  Findings: Interval nasogastric tube with its tip in the mid stomach.  Lumbar spine degenerative changes.  IMPRESSION: Nasogastric tube tip in the mid stomach.  Original Report Authenticated By:  Darrol Angel, M.D.   Ct Abdomen Pelvis W Contrast  05/28/2012  *RADIOLOGY REPORT*  Clinical Data: Abdominal pain  CT ABDOMEN AND PELVIS WITH CONTRAST  Technique:  Multidetector CT imaging of the abdomen and pelvis was performed following the standard protocol during bolus administration of intravenous contrast.  Contrast: OMNIPAQUE IOHEXOL 300 MG/ML  SOLN  Comparison: 03/25/2012  Findings: Tiny bilateral pleural effusions.  Bibasilar atelectasis.  Cardiomegaly  Hiatal hernia  The stomach and several small bowel loops are distended.  Distal ileum is relatively decompressed.  The transition point is in the right lower quadrant without conspicuous cause.  Small amount of free fluid is seen in the right paracolic gutter. Free fluid is seen layering in the pelvis.  No loculated abscess.  Diverticulosis of the descending colon is noted.  There is noticeable stranding in the fat adjacent to the descending colon as well as in the omentum.  Peritoneal carcinomatosis has a similar appearance.  This may represent edema or inflammatory changes.  Benign appearing renal cysts.  Pancreatic atrophy.  Pancreatic and common bile duct are prominent but this might simply reflect bowel obstruction.  Duodenal diverticuli are present.  Nonspecific hypodensity at the dome of the liver is stable.  IMPRESSION: Small bowel obstruction pattern.  The transition point is in the distal ileum.  There is stranding within the fat adjacent to the descending colon and omentum.  This might reflect edema, inflammatory changes,  or peritoneal carcinomatosis.  Colonic diverticulosis is associated.  Original Report Authenticated By: Donavan Burnet, M.D.   Dg Abd 2 Views  06/01/2012  *RADIOLOGY REPORT*  Clinical Data: Small bowel obstruction  ABDOMEN - 2 VIEW  Findings:  An enteric tube overlies the left upper abdominal quadrant, tip overlying the expected location of the gastric antrum. There is an overall paucity of bowel gas.  Moderate  colonic stool burden.  No pneumoperitoneum, pneumatosis or portal venous gas.  Limited visualization lower thorax demonstrate small bilateral effusions and bibasilar heterogeneous / consolidative opacities.  Lumbar spine and bilateral hip degenerative change, right greater than left.  Right-sided SI joint degenerative change.  Vascular calcifications.  IMPRESSION: 1.  Paucity of bowel gas without definite evidence of obstruction.  2. Small bilateral pleural effusions and basilar heterogeneous opacities, atelectasis versus infiltrate.  Further evaluation with dedicated chest radiograph may be performed as clinically indicated.  This was made a call report.  Original Report Authenticated By: Waynard Reeds, M.D.   Dg Abd 2 Views  05/30/2012  *RADIOLOGY REPORT*  Clinical Data: Abdominal discomfort.  Distention.  ABDOMEN - 2 VIEW  Comparison: CT abdomen pelvis 05/28/2012 and abdominal radiograph 05/28/2012.  Findings: There is a relative paucity of gas in the abdomen. Dilated small bowel loops and air-fluid levels are seen in the right upper quadrant.  Curvilinear calcification in the left abdomen corresponds to the wall of a low attenuation lesion off the lower pole left kidney, better seen on 05/28/2012.  Degenerative changes are seen in the spine, sacroiliac joints, hips and symphysis pubis.  Incidental imaging of the chest shows a layering left pleural effusion.  IMPRESSION:  Relative paucity of gas in the abdomen with mildly dilated loops of small bowel in the right upper quadrant and associated air fluid levels, indicating a persistent obstructive pattern.  Original Report Authenticated By: Reyes Ivan, M.D.   Dg Abd 2 Views  05/28/2012  *RADIOLOGY REPORT*  Clinical Data: 78 year old female with abdominal pain and distention.  Possible small bowel obstruction.  ABDOMEN - 2 VIEW  Comparison: 05/27/2012 and prior abdominal radiographs  Findings: Gas and fluid filled small bowel loops are again  identified with the dilated loop in the left lower abdomen/pelvis now not visualized. A small amount of gas in the colon is noted. There is no evidence of pneumoperitoneum. Bibasilar atelectasis and degenerative changes in the lumbar spine and hips are again noted.  IMPRESSION: Nonspecific bowel gas pattern again identified without pneumoperitoneum. Apparent slight improvement of distended bowel loops in the left lower abdomen. Small bowel obstruction is not excluded given these findings.  Original Report Authenticated By: Rosendo Gros, M.D.   Dg Abd 2 Views  05/27/2012  *RADIOLOGY REPORT*  Clinical Data: Altered mental status.  Question free intraperitoneal air.  ABDOMEN - 2 VIEW  Comparison: Two views the abdomen 04/27/2012.  Findings: No free intraperitoneal air is identified.  There are gas filled and mildly dilated loops of small bowel measuring up to 3.4 cm in diameter with air-fluid levels present.  Lumbar scoliosis and degenerative change are noted.  Degenerative disease is also present about the hips, worse on the right.  IMPRESSION:  1.  Findings worrisome for small bowel obstruction. 2.  Negative for free intraperitoneal air.  Original Report Authenticated By: Bernadene Bell. Maricela Curet, M.D.   Dg Abd Portable 2v  05/31/2012  *RADIOLOGY REPORT*  Clinical Data: Vomiting, possible small bowel obstruction.  PORTABLE ABDOMEN - 2 VIEW  Comparison: 05/30/2012  Findings: No interval change in bowel gas pattern.  Relative paucity of small bowel gas in keeping with known fluid filled loops.  Air within mildly dilated bowel loops of the right upper quadrant, similar to prior. The lung bases/hemidiaphragms are essentially excluded from image.  Degenerative changes of the lumbar spine and SI joints.  IMPRESSION: No significant change in the fluid-filled small bowel obstruction pattern.  Original Report Authenticated By: Waneta Martins, M.D.   Dg Naso G Tube Plc W/fl-no Rad  05/31/2012  CLINICAL DATA: SBO,  unable to place tube at bedside   NASO G TUBE PLACEMENT WITH FLUORO  Fluoroscopy was utilized by the requesting physician.  No radiographic  interpretation.      Medications: Scheduled Meds:    . antiseptic oral rinse  15 mL Mouth Rinse q12n4p  . chlorhexidine  15 mL Mouth Rinse BID  . heparin  5,000 Units Subcutaneous Q8H  . insulin aspart  0-9 Units Subcutaneous Q4H  . levothyroxine  26 mcg Intravenous Daily  . pantoprazole (PROTONIX) IV  40 mg Intravenous Q24H  . potassium chloride  10 mEq Intravenous Q1 Hr x 3  . potassium chloride  10 mEq Intravenous Q1 Hr x 3  . Travoprost (BAK Free)  1 drop Both Eyes QHS   Continuous Infusions:    . dextrose 5 % and 0.45 % NaCl with KCl 20 mEq/L 100 mL/hr at 06/05/12 1610     Assessment/Plan: Principal Problem:  *SBO (small bowel obstruction) no significant improvement: status post surgery postop day 1 - s/p an exploratory laparotomy, SBO secondary to adhesive band -Further management per [facility] surgery  Hypokalemia: Replaced IV   AKI (acute kidney injury): Resolved, likely prerenal     DM (diabetes mellitus), type 2 with complications: BS in stable range - Continue CBGs q4 hours, continue sliding scale insulin   Hypertension: Stable    Hypothyroidism: - Continue IV Synthroid, TSH 3.9  Leukocytosis: Possibly stress leukocytosis/demargination, afebrile - If patient develops any fevers, will obtain blood cultures, may need antibiotics, follow closely  DVT Prophylaxis:Heparin subcutaneous   Code Status:    LOS: 9 days   RAI,RIPUDEEP M.D. [facility] 06/05/2012, 11:10 AM Pager: [phone]  If 7PM-7AM, please contact night-coverage [email or web address] Password [email or web address]

## 2012-06-06 ENCOUNTER — Encounter (HOSPITAL_COMMUNITY): Payer: Self-pay | Admitting: General Surgery

## 2012-06-06 LAB — CBC
HCT: 30.9 % — ABNORMAL LOW (ref 36.0–46.0)
Hemoglobin: 10.4 g/dL — ABNORMAL LOW (ref 12.0–15.0)
MCV: 84.9 fL (ref 78.0–100.0)
Platelets: 160 10*3/uL (ref 150–400)
RBC: 3.64 MIL/uL — ABNORMAL LOW (ref 3.87–5.11)
WBC: 9.8 10*3/uL (ref 4.0–10.5)

## 2012-06-06 LAB — TYPE AND SCREEN: Unit division: 0

## 2012-06-06 LAB — BASIC METABOLIC PANEL
CO2: 28 mEq/L (ref 19–32)
Chloride: 103 mEq/L (ref 96–112)
Creatinine, Ser: 0.63 mg/dL (ref 0.50–1.10)
Glucose, Bld: 147 mg/dL — ABNORMAL HIGH (ref 70–99)

## 2012-06-06 LAB — GLUCOSE, CAPILLARY
Glucose-Capillary: 131 mg/dL — ABNORMAL HIGH (ref 70–99)
Glucose-Capillary: 145 mg/dL — ABNORMAL HIGH (ref 70–99)

## 2012-06-06 NOTE — Progress Notes (Signed)
3 Days Post-Op  Subjective: Resting comfortably, says her stomach is rumbling, no other c/io offered.  Objective: Vital signs in last 24 hours: Temp:  [98.3 F (36.8 C)-99.7 F (37.6 C)] 98.3 F (36.8 C) (08/05 0450) Pulse Rate:  [79-94] 79  (08/05 0450) Resp:  [16-18] 16  (08/05 0450) BP: (144-175)/(57-78) 144/66 mmHg (08/05 0450) SpO2:  [94 %-98 %] 94 % (08/05 0450) Weight:  [138 lb 10.7 oz (62.9 kg)] 138 lb 10.7 oz (62.9 kg) (08/04 2019) Last BM Date: 06/02/12  Intake/Output from previous day: 08/04 0701 - 08/05 0700 In: 2681.7 [I.V.:2371.7; IV Piggyback:310] Out: 3075 [Urine:2950; Emesis/NG output:125] Intake/Output this shift:    General appearance: alert, cooperative, appears stated age and no distress Chest: CTA bilaterally Abdomen: soft, appropriately tender, staples intact no erythema or drainage noted, dressing is clean and dry. No BS heard. Ng remains in place (125 ml past 24 hours) VSS, afebrile.   Lab Results:   Basename 06/06/12 0500 06/04/12 0457  WBC 9.8 16.0*  HGB 10.4* 12.6  HCT 30.9* 36.9  PLT 160 252   BMET  Basename 06/06/12 0500 06/04/12 0457  NA 136 137  K 3.9 3.2*  CL 103 101  CO2 28 27  GLUCOSE 147* 157*  BUN 4* 4*  CREATININE 0.63 0.65  CALCIUM 8.4 8.3*   PT/INR No results found for this basename: LABPROT:2,INR:2 in the last 72 hours ABG No results found for this basename: PHART:2,PCO2:2,PO2:2,HCO3:2 in the last 72 hours  Studies/Results: No results found.  Anti-infectives: Anti-infectives     Start     Dose/Rate Route Frequency Ordered Stop   06/03/12 1600   ertapenem (INVANZ) 1 g in sodium chloride 0.9 % 50 mL IVPB        1 g 100 mL/hr over 30 Minutes Intravenous  Once 06/03/12 1552 06/03/12 1743          Assessment/Plan: s/p Procedure(s) (LRB): EXPLORATORY LAPAROTOMY, enterolysis - [name] - 8/2/013 Illeus post-op (expected) Stable  1. Keep NPO for now 2. Continue IVF 3. PT/OT, mobilization, OOB 4. Recheck  abd films in am   LOS: 10 days    DUANNE, JEREMY 06/06/2012  Agree with above.  Ovidio Kin, MD, [facility] Pager: [phone] Office phone:  [phone]

## 2012-06-06 NOTE — Progress Notes (Signed)
Physical Therapy Evaluation Patient Details Name: Megan [name] MRN: [id number] DOB: 01/13/1927 Today's Date: 06/06/2012 Time: 4696-2952 PT Time Calculation (min): 20 min  PT Assessment / Plan / Recommendation Clinical Impression  78 yo female admitted with SBO, now s/p abdominal surgery; Presents with decr independence and safety with mobility; Will benefit from acute PT to maximize independence and safety with mobility, and to facilitate dc planning    PT Assessment  Patient needs continued PT services    Follow Up Recommendations  Skilled nursing facility;Supervision/Assistance - 24 hour    Barriers to Discharge Other (comment) Will need more info re: pt's home situation, and availabe assist she'll have at home; If she has 24 hour reliable assist, then will consider dc home with HHPT follow-up    Equipment Recommendations  Defer to next venue;Rolling walker with 5" wheels;3 in 1 bedside comode    Recommendations for Other Services OT consult   Frequency Min 3X/week    Precautions / Restrictions Precautions Precautions: Fall Precaution Comments: Pt also with NGT   Pertinent Vitals/Pain Abdominal pain with mobility; did not rate; grimace with moving; repostitioned      Mobility  Bed Mobility Bed Mobility: Rolling Left;Left Sidelying to Sit;Sitting - Scoot to [place] of Bed Rolling Left: 4: Min assist;With rail Left Sidelying to Sit: 3: Mod assist;With rails Sitting - Scoot to Edge of Bed: 4: Min assist;With rail Details for Bed Mobility Assistance: Cues for logroll technique to minimize abdominal pain Transfers Transfers: Sit to Stand;Stand to Sit Sit to Stand: 3: Mod assist;From bed Stand to Sit: 4: Min assist;To chair/3-in-1;With armrests Details for Transfer Assistance: Cues to initiate; Pt naturally reached for UE support from PT and tech for successful sit to stand Ambulation/Gait Ambulation/Gait Assistance: 3: Mod assist Ambulation Distance (Feet): 2 Feet (pivot  steps bed to chair) Assistive device: 2 person hand held assist Ambulation/Gait Assistance Details: shorts steps, trunk flexed Gait Pattern: Trunk flexed    Exercises     PT Diagnosis: Difficulty walking;Acute pain;Generalized weakness  PT Problem List: Decreased strength;Decreased activity tolerance;Decreased balance;Decreased mobility;Decreased coordination;Decreased knowledge of use of DME;Decreased safety awareness;Pain;Decreased knowledge of precautions PT Treatment Interventions: DME instruction;Gait training;Stair training;Functional mobility training;Therapeutic activities;Therapeutic exercise;Balance training;Patient/family education   PT Goals Acute Rehab PT Goals PT Goal Formulation: With patient Time For Goal Achievement: 06/20/12 Potential to Achieve Goals: Fair Pt will go Supine/Side to Sit: with modified independence PT Goal: Supine/Side to Sit - Progress: Goal set today Pt will go Sit to Supine/Side: with modified independence PT Goal: Sit to Supine/Side - Progress: Goal set today Pt will go Sit to Stand: with modified independence PT Goal: Sit to Stand - Progress: Goal set today Pt will go Stand to Sit: with modified independence PT Goal: Stand to Sit - Progress: Goal set today Pt will Ambulate: >150 feet;with modified independence;with least restrictive assistive device PT Goal: Ambulate - Progress: Goal set today Pt will Go Up / Down Stairs: 3-5 stairs;with min assist;with least restrictive assistive device PT Goal: Up/Down Stairs - Progress: Goal set today  Visit Information  Last PT Received On: 06/06/12 Assistance Needed: +1 (+2 may be helpful with progressive ambulation)    Subjective Data  Subjective: Agreeable to OOB Patient Stated Goal: Wants to be out of the hospital   Prior Functioning  Home Living Lives With: Alone (Need to verify) Available Help at Discharge: Family;Available PRN/intermittently (Reports has "girls in town") Type of Home:  House Home Access:  (to be determined) Home Layout: Two level;Laundry  or work area in basement;Able to live on main level with bedroom/bathroom (apparently doesn't need to access ground floor) Bathroom Shower/Tub:  (to be determined) Home Adaptive Equipment: Walker - rolling (need to verify if she has more home adaptive equip) Prior Function Level of Independence:  (To be determined) Communication Communication: Other (comment) (Can be difficult to understand her speech)    Cognition  Overall Cognitive Status: Appears within functional limits for tasks assessed/performed Arousal/Alertness: Awake/alert Orientation Level: Appears intact for tasks assessed Behavior During Session: [facility] for tasks performed    Extremity/Trunk Assessment Right Upper Extremity Assessment RUE ROM/Strength/Tone: [facility] for tasks assessed Left Upper Extremity Assessment LUE ROM/Strength/Tone: WFL for tasks assessed Right Lower Extremity Assessment RLE ROM/Strength/Tone: Deficits RLE ROM/Strength/Tone Deficits: Generalized weakness, with dependence on UE support for sit to stnad Left Lower Extremity Assessment LLE ROM/Strength/Tone: Deficits LLE ROM/Strength/Tone Deficits: Generalized weakness, with dependence on UE support for sit to stnad Trunk Assessment Trunk Assessment: Other exceptions Trunk Exceptions: Painful abdomen from surgery   Balance    End of Session PT - End of Session Activity Tolerance: Patient limited by pain;Patient limited by fatigue Patient left: in chair;with call bell/phone within reach Nurse Communication: Mobility status  GP     [facility] [place], [place] [phone]  06/06/2012, 12:12 PM

## 2012-06-06 NOTE — Progress Notes (Signed)
Patient ID: Megan [name]  female  WUJ:[id number]    DOB: [birth year]    DOA: 05/27/2012  PCP: [name], MD  Medicine Consult f/u progress note:  Subjective: Status post exploratory laparotomy and enterolysis, postop day3, no specific complaints  Objective: Weight change: 0.4 kg (14.1 oz)  Intake/Output Summary (Last 24 hours) at 06/06/12 1558 Last data filed at 06/06/12 1500  Gross per 24 hour  Intake   3510 ml  Output   3175 ml  Net    335 ml   Blood pressure 157/66, pulse 87, temperature 98.5 F (36.9 C), temperature source Oral, resp. rate 18, height 5\' 7"  (1.702 m), weight 62.9 kg (138 lb 10.7 oz), SpO2 98.00%.  Physical Exam: General: Alert and awake, not in any acute distress. NG  HEENT: anicteric sclera, pupils reactive to light and accommodation, EOMI CVS: S1-S2 clear, no murmur rubs or gallops Chest: clear to auscultation bilaterally, no wheezing, rales or rhonchi Abdomen: dressing intact,  Extremities: no cyanosis, clubbing or edema noted bilaterally  Lab Results: Basic Metabolic Panel:  Lab 06/06/12 9562 06/04/12 0457  NA 136 137  K 3.9 3.2*  CL 103 101  CO2 28 27  GLUCOSE 147* 157*  BUN 4* 4*  CREATININE 0.63 0.65  CALCIUM 8.4 8.3*  MG -- --  PHOS -- --   CBC:  Lab 06/06/12 0500 06/04/12 0457  WBC 9.8 16.0*  NEUTROABS -- --  HGB 10.4* 12.6  HCT 30.9* 36.9  MCV 84.9 85.2  PLT 160 252   CBG:  Lab 06/06/12 1211 06/06/12 0732 06/06/12 0423 06/06/12 0005 06/05/12 2027  GLUCAP 98 143* 153* 116* 131*      Studies/Results: Dg Abd 1 View  05/31/2012  *RADIOLOGY REPORT*  Clinical Data: Nasogastric tube placement with fluoroscopic guidance by the fluoroscopic technologist.  ABDOMEN - 1 VIEW  Comparison: Earlier today.  Findings: Interval nasogastric tube with its tip in the mid stomach.  Lumbar spine degenerative changes.  IMPRESSION: Nasogastric tube tip in the mid stomach.  Original Report Authenticated By: Darrol Angel, M.D.   Ct  Abdomen Pelvis W Contrast  05/28/2012  *RADIOLOGY REPORT*  Clinical Data: Abdominal pain  CT ABDOMEN AND PELVIS WITH CONTRAST  Technique:  Multidetector CT imaging of the abdomen and pelvis was performed following the standard protocol during bolus administration of intravenous contrast.  Contrast: OMNIPAQUE IOHEXOL 300 MG/ML  SOLN  Comparison: 03/25/2012  Findings: Tiny bilateral pleural effusions.  Bibasilar atelectasis.  Cardiomegaly  Hiatal hernia  The stomach and several small bowel loops are distended.  Distal ileum is relatively decompressed.  The transition point is in the right lower quadrant without conspicuous cause.  Small amount of free fluid is seen in the right paracolic gutter. Free fluid is seen layering in the pelvis.  No loculated abscess.  Diverticulosis of the descending colon is noted.  There is noticeable stranding in the fat adjacent to the descending colon as well as in the omentum.  Peritoneal carcinomatosis has a similar appearance.  This may represent edema or inflammatory changes.  Benign appearing renal cysts.  Pancreatic atrophy.  Pancreatic and common bile duct are prominent but this might simply reflect bowel obstruction.  Duodenal diverticuli are present.  Nonspecific hypodensity at the dome of the liver is stable.  IMPRESSION: Small bowel obstruction pattern.  The transition point is in the distal ileum.  There is stranding within the fat adjacent to the descending colon and omentum.  This might reflect edema, inflammatory changes,  or peritoneal carcinomatosis.  Colonic diverticulosis is associated.  Original Report Authenticated By: Donavan Burnet, M.D.   Dg Abd 2 Views  06/01/2012  *RADIOLOGY REPORT*  Clinical Data: Small bowel obstruction  ABDOMEN - 2 VIEW  Findings:  An enteric tube overlies the left upper abdominal quadrant, tip overlying the expected location of the gastric antrum. There is an overall paucity of bowel gas.  Moderate colonic stool burden.  No  pneumoperitoneum, pneumatosis or portal venous gas.  Limited visualization lower thorax demonstrate small bilateral effusions and bibasilar heterogeneous / consolidative opacities.  Lumbar spine and bilateral hip degenerative change, right greater than left.  Right-sided SI joint degenerative change.  Vascular calcifications.  IMPRESSION: 1.  Paucity of bowel gas without definite evidence of obstruction.  2. Small bilateral pleural effusions and basilar heterogeneous opacities, atelectasis versus infiltrate.  Further evaluation with dedicated chest radiograph may be performed as clinically indicated.  This was made a call report.  Original Report Authenticated By: Waynard Reeds, M.D.   Dg Abd 2 Views  05/30/2012  *RADIOLOGY REPORT*  Clinical Data: Abdominal discomfort.  Distention.  ABDOMEN - 2 VIEW  Comparison: CT abdomen pelvis 05/28/2012 and abdominal radiograph 05/28/2012.  Findings: There is a relative paucity of gas in the abdomen. Dilated small bowel loops and air-fluid levels are seen in the right upper quadrant.  Curvilinear calcification in the left abdomen corresponds to the wall of a low attenuation lesion off the lower pole left kidney, better seen on 05/28/2012.  Degenerative changes are seen in the spine, sacroiliac joints, hips and symphysis pubis.  Incidental imaging of the chest shows a layering left pleural effusion.  IMPRESSION:  Relative paucity of gas in the abdomen with mildly dilated loops of small bowel in the right upper quadrant and associated air fluid levels, indicating a persistent obstructive pattern.  Original Report Authenticated By: Reyes Ivan, M.D.   Dg Abd 2 Views  05/28/2012  *RADIOLOGY REPORT*  Clinical Data: 78 year old female with abdominal pain and distention.  Possible small bowel obstruction.  ABDOMEN - 2 VIEW  Comparison: 05/27/2012 and prior abdominal radiographs  Findings: Gas and fluid filled small bowel loops are again identified with the dilated loop in  the left lower abdomen/pelvis now not visualized. A small amount of gas in the colon is noted. There is no evidence of pneumoperitoneum. Bibasilar atelectasis and degenerative changes in the lumbar spine and hips are again noted.  IMPRESSION: Nonspecific bowel gas pattern again identified without pneumoperitoneum. Apparent slight improvement of distended bowel loops in the left lower abdomen. Small bowel obstruction is not excluded given these findings.  Original Report Authenticated By: Rosendo Gros, M.D.   Dg Abd 2 Views  05/27/2012  *RADIOLOGY REPORT*  Clinical Data: Altered mental status.  Question free intraperitoneal air.  ABDOMEN - 2 VIEW  Comparison: Two views the abdomen 04/27/2012.  Findings: No free intraperitoneal air is identified.  There are gas filled and mildly dilated loops of small bowel measuring up to 3.4 cm in diameter with air-fluid levels present.  Lumbar scoliosis and degenerative change are noted.  Degenerative disease is also present about the hips, worse on the right.  IMPRESSION:  1.  Findings worrisome for small bowel obstruction. 2.  Negative for free intraperitoneal air.  Original Report Authenticated By: Bernadene Bell. Maricela Curet, M.D.   Dg Abd Portable 2v  05/31/2012  *RADIOLOGY REPORT*  Clinical Data: Vomiting, possible small bowel obstruction.  PORTABLE ABDOMEN - 2 VIEW  Comparison: 05/30/2012  Findings: No interval change in bowel gas pattern.  Relative paucity of small bowel gas in keeping with known fluid filled loops.  Air within mildly dilated bowel loops of the right upper quadrant, similar to prior. The lung bases/hemidiaphragms are essentially excluded from image.  Degenerative changes of the lumbar spine and SI joints.  IMPRESSION: No significant change in the fluid-filled small bowel obstruction pattern.  Original Report Authenticated By: Waneta Martins, M.D.   Dg Naso G Tube Plc W/fl-no Rad  05/31/2012  CLINICAL DATA: SBO, unable to place tube at bedside   NASO G  TUBE PLACEMENT WITH FLUORO  Fluoroscopy was utilized by the requesting physician.  No radiographic  interpretation.      Medications: Scheduled Meds:    . antiseptic oral rinse  15 mL Mouth Rinse q12n4p  . chlorhexidine  15 mL Mouth Rinse BID  . heparin  5,000 Units Subcutaneous Q8H  . insulin aspart  0-9 Units Subcutaneous Q4H  . levothyroxine  26 mcg Intravenous Daily  . pantoprazole (PROTONIX) IV  40 mg Intravenous Q24H  . Travoprost (BAK Free)  1 drop Both Eyes QHS   Continuous Infusions:    . dextrose 5 % and 0.45 % NaCl with KCl 20 mEq/L 100 mL/hr at 06/06/12 0417     Assessment/Plan: Principal Problem:  *SBO (small bowel obstruction) no significant improvement: status post surgery on 06/03/12 - s/p an exploratory laparotomy, SBO secondary to adhesive band - Further management per [facility] surgery (now primary)  Hypokalemia: resolved   AKI (acute kidney injury): Resolved, likely prerenal     DM (diabetes mellitus), type 2 with complications: BS in stable range - Continue CBGs q4 hours, continue sliding scale insulin   Hypertension: Stable    Hypothyroidism: - Continue IV Synthroid, TSH 3.9  Leukocytosis: resolved, possibly stress leukocytosis/demargination, afebrile  DVT Prophylaxis:Heparin subcutaneous   Code Status:    LOS: 10 days   Moni Rothrock M.D. [facility] 06/06/2012, 3:58 PM Pager: [phone]  If 7PM-7AM, please contact night-coverage [email or web address] Password [email or web address]

## 2012-06-07 ENCOUNTER — Inpatient Hospital Stay (HOSPITAL_COMMUNITY): Payer: Medicare Other

## 2012-06-07 DIAGNOSIS — R9431 Abnormal electrocardiogram [ECG] [EKG]: Secondary | ICD-10-CM

## 2012-06-07 LAB — GLUCOSE, CAPILLARY
Glucose-Capillary: 135 mg/dL — ABNORMAL HIGH (ref 70–99)
Glucose-Capillary: 137 mg/dL — ABNORMAL HIGH (ref 70–99)
Glucose-Capillary: 151 mg/dL — ABNORMAL HIGH (ref 70–99)

## 2012-06-07 LAB — BASIC METABOLIC PANEL
BUN: 4 mg/dL — ABNORMAL LOW (ref 6–23)
CO2: 29 mEq/L (ref 19–32)
Chloride: 101 mEq/L (ref 96–112)
Creatinine, Ser: 0.61 mg/dL (ref 0.50–1.10)
Glucose, Bld: 106 mg/dL — ABNORMAL HIGH (ref 70–99)
Potassium: 3.5 mEq/L (ref 3.5–5.1)

## 2012-06-07 MED ORDER — METOPROLOL TARTRATE 1 MG/ML IV SOLN
2.5000 mg | Freq: Three times a day (TID) | INTRAVENOUS | Status: DC | PRN
  Filled 2012-06-07: qty 5

## 2012-06-07 MED ORDER — METOPROLOL TARTRATE 1 MG/ML IV SOLN: 2.5000 mg | Freq: Three times a day (TID) | INTRAVENOUS | Status: DC

## 2012-06-07 NOTE — Progress Notes (Signed)
Occupational Therapy Evaluation Patient Details Name: Megan [name] MRN: [id number] DOB: 22-Sep-1927 Today's Date: 06/07/2012 Time: 9629-5284 OT Time Calculation (min): 16 min  OT Assessment / Plan / Recommendation Clinical Impression  78 yo s/o SBO. PTA, pt lived at home with husband who is BLE amputee and has PCA 5 days/wk. Pt appears to be somewhat self limiting in what she is capable of doing. Pt will benefit from OT with adequate participation to incresae mobility and independence for ADL. Pt will need SNF if 24/7 S is not available at D/C.    OT Assessment  Patient needs continued OT Services    Follow Up Recommendations  Home health OT;Skilled nursing facility;Other (comment) (pending progress)    Barriers to Discharge Other (comment) (unsure of D/C environment)    Equipment Recommendations  Rolling walker with 5" wheels;3 in 1 bedside comode;Tub/shower seat    Recommendations for Other Services    Frequency  Min 2X/week    Precautions / Restrictions Precautions Precautions: Fall Precaution Comments: Pt also with NGT   Pertinent Vitals/Pain none    ADL  Eating/Feeding: NPO Grooming: Simulated;Set up Where Assessed - Grooming: Supported sitting Upper Body Bathing: Simulated;Set up Where Assessed - Upper Body Bathing: Supported sitting Lower Body Bathing: Simulated;Minimal assistance Upper Body Dressing: Simulated;Set up Lower Body Dressing: Simulated;Minimal assistance Transfers/Ambulation Related to ADLs: pt declined any mobility    OT Diagnosis: Generalized weakness  OT Problem List: Decreased strength;Decreased safety awareness;Decreased knowledge of use of DME or AE OT Treatment Interventions: Self-care/ADL training;Energy conservation;DME and/or AE instruction;Patient/family education;Therapeutic activities   OT Goals Acute Rehab OT Goals OT Goal Formulation: With patient Time For Goal Achievement: 06/14/12 Potential to Achieve Goals: Fair ADL Goals Pt  Will Perform Lower Body Bathing: with modified independence;Sit to stand from chair;Unsupported ADL Goal: Lower Body Bathing - Progress: Goal set today Pt Will Perform Lower Body Dressing: with modified independence;Sit to stand from chair;Unsupported ADL Goal: Lower Body Dressing - Progress: Goal set today Pt Will Transfer to Toilet: with modified independence;with DME;3-in-1 ADL Goal: Toilet Transfer - Progress: Goal set today Pt Will Perform Toileting - Clothing Manipulation: with modified independence;Standing ADL Goal: Toileting - Clothing Manipulation - Progress: Goal set today Pt Will Perform Toileting - Hygiene: with modified independence;Standing at 3-in-1/toilet ADL Goal: Toileting - Hygiene - Progress: Goal set today Additional ADL Goal #1: complete functional mobility RW level @ room during ADl task @ mod i level. ADL Goal: Additional Goal #1 - Progress: Goal set today  Visit Information  Last OT Received On: 06/07/12 Assistance Needed: +1    Subjective Data      Prior Functioning  Vision/Perception  Home Living Lives With: Spouse;Family Available Help at Discharge: Family;Personal care attendant (has PCA who assists with husband) Type of Home: House Home Access: Other (comment) ("someone helps him in and out of the house") Home Layout: Two level;Laundry or work area in basement;Able to live on main level with bedroom/bathroom Bathroom Shower/Tub: Engineer, manufacturing systems: Standard Home Adaptive Equipment: Walker - rolling Prior Function Level of Independence: Independent;Independent with assistive device(s) (walker or cane at times) Driving: Yes Communication Communication: No difficulties Dominant Hand: Right      Cognition  Overall Cognitive Status: No family/caregiver present to determine baseline cognitive functioning Arousal/Alertness: Awake/alert Orientation Level: Appears intact for tasks assessed Behavior During Session: Agitated Cognition -  Other Comments: appears somewhat cautious in answering questions    Extremity/Trunk Assessment Right Upper Extremity Assessment RUE ROM/Strength/Tone: [facility] for tasks assessed Left  Upper Extremity Assessment LUE ROM/Strength/Tone: [facility] for tasks assessed   Mobility Bed Mobility Bed Mobility: Supine to Sit;Sitting - Scoot to Edge of Bed Supine to Sit: 4: Min assist;With rails;HOB elevated (slightly) Sitting - Scoot to Edge of Bed: 4: Min assist;With rail Details for Bed Mobility Assistance: Needign cues to initiate and follow-through with mivement Transfers Sit to Stand: 4: Min guard;From bed;With upper extremity assist Stand to Sit: 4: Min guard;With armrests;To chair/3-in-1 Details for Transfer Assistance: Cues for safety and hand placement; Pt very much wanting to do task of standing independently, but does pull up on RW   Exercise    Balance    End of Session OT - End of Session Activity Tolerance: Other (comment) (treatment limited by self limiting behavior) Patient left: in bed;with call bell/phone within reach  GO     [facility] 06/07/2012, 4:54 PM [facility], OTR/L  [phone] 06/07/2012

## 2012-06-07 NOTE — Progress Notes (Signed)
Patient ID: Megan [name], female   DOB: December 22, 1926, 78 y.o.   MRN: [id number] 4 Days Post-Op   Subjective: Pt without complaints.  Has urinated in the bed.  No flatus.  Not ambulating much at all.  Refused PT eval yesterday.  Objective: Vital signs in last 24 hours: Temp:  [98.5 F (36.9 C)-99.9 F (37.7 C)] 98.9 F (37.2 C) (08/06 0417) Pulse Rate:  [61-101] 94  (08/06 0417) Resp:  [18] 18  (08/06 0417) BP: (150-165)/(54-72) 156/68 mmHg (08/06 0417) SpO2:  [96 %-100 %] 100 % (08/06 0417) Weight:  [141 lb 15.6 oz (64.4 kg)] 141 lb 15.6 oz (64.4 kg) (08/05 2007) Last BM Date: 06/02/12  Intake/Output from previous day: 08/05 0701 - 08/06 0700 In: 2405 [I.V.:2405] Out: 2100 [Urine:2000; Emesis/NG output:100] Intake/Output this shift:    PE: Abd: soft, minimally tender, -BS, incision c/i, some old bloody drainage with a clot noted on her incision Ht: irregularly irregular Heart: CTAB  Lab Results:   Basename 06/06/12 0500  WBC 9.8  HGB 10.4*  HCT 30.9*  PLT 160   BMET  Basename 06/06/12 0500  NA 136  K 3.9  CL 103  CO2 28  GLUCOSE 147*  BUN 4*  CREATININE 0.63  CALCIUM 8.4   PT/INR No results found for this basename: LABPROT:2,INR:2 in the last 72 hours CMP     Component Value Date/Time   NA 136 06/06/2012 0500   K 3.9 06/06/2012 0500   CL 103 06/06/2012 0500   CO2 28 06/06/2012 0500   GLUCOSE 147* 06/06/2012 0500   BUN 4* 06/06/2012 0500   CREATININE 0.63 06/06/2012 0500   CALCIUM 8.4 06/06/2012 0500   PROT 7.7 05/27/2012 1151   ALBUMIN 3.5 05/27/2012 1151   AST 15 05/27/2012 1151   ALT 6 05/27/2012 1151   ALKPHOS 54 05/27/2012 1151   BILITOT 1.0 05/27/2012 1151   GFRNONAA 81* 06/06/2012 0500   GFRAA >90 06/06/2012 0500   Lipase     Component Value Date/Time   LIPASE 12 05/27/2012 1151    Studies/Results: No results found.  Anti-infectives: Anti-infectives     Start     Dose/Rate Route Frequency Ordered Stop   06/03/12 1600   ertapenem (INVANZ) 1 g in sodium  chloride 0.9 % 50 mL IVPB        1 g 100 mL/hr over 30 Minutes Intravenous  Once 06/03/12 1552 06/03/12 1743           Assessment/Plan  1. S/p ex lap with LOA, post-op ileus.  enterolysis - B. [name] - 8/2/013  2. New onset A. Fib, do not see a hx of this. 3. Multiple other medical problems 4. Deconditioning  Plan: 1. Cont NGT and NPO due to post-op ileus. 2. Pt needs to mobilize better.  Continue to work with PT.  Will likely need SNF at discharge. 3. New onset intermittent A. Fib.  Will contact the hospitalist to make them aware of this and see how they want to proceed.   LOS: 11 days    [name],[name] 06/07/2012  Agree with above. Husband at bedside.  Patient getting up to urinate.  No bowel movement yet.  Ovidio Kin, MD, [facility] Surgery Pager: [phone] Office phone:  [phone]

## 2012-06-07 NOTE — Progress Notes (Signed)
Patient ID: Megan [name]  female  ZOX:[id number]    DOB: May 21, 1927    DOA: 05/27/2012  PCP: [name], MD  Medicine Consult f/u progress note:  Subjective: Status post exploratory laparotomy and enterolysis, postop day4. Called by surgery PA today a.m. for possible A. fib intermittent. EKG done today, showed frequent PVCs and PACs but sinus rhythm, diffuse TWI in V2-V6, III, AVF and frequent PVCs, marked sinus arrhythmias (new from previous EKG in 04/2012)  Objective: Weight change: 1.5 kg (3 lb 4.9 oz)  Intake/Output Summary (Last 24 hours) at 06/07/12 1559 Last data filed at 06/07/12 1249  Gross per 24 hour  Intake 1576.67 ml  Output   1950 ml  Net -373.33 ml   Blood pressure 146/70, pulse 87, temperature 98.6 F (37 C), temperature source Oral, resp. rate 18, height 5\' 7"  (1.702 m), weight 64.4 kg (141 lb 15.6 oz), SpO2 96.00%.  Physical Exam: General: Alert and awake, not in any acute distress. NG  HEENT: anicteric sclera, pupils reactive to light and accommodation, EOMI CVS: S1-S2 clear, no murmur rubs or gallops Chest: clear to auscultation bilaterally, no wheezing, rales or rhonchi Abdomen: dressing intact,  Extremities: no cyanosis, clubbing or edema noted bilaterally  Lab Results: Basic Metabolic Panel:  Lab 06/07/12 8119 06/06/12 0500  NA 136 136  K 3.5 3.9  CL 101 103  CO2 29 28  GLUCOSE 106* 147*  BUN 4* 4*  CREATININE 0.61 0.63  CALCIUM 8.8 8.4  MG 1.4* --  PHOS -- --   CBC:  Lab 06/06/12 0500 06/04/12 0457  WBC 9.8 16.0*  NEUTROABS -- --  HGB 10.4* 12.6  HCT 30.9* 36.9  MCV 84.9 85.2  PLT 160 252   CBG:  Lab 06/07/12 1136 06/07/12 0727 06/07/12 0413 06/07/12 0012 06/06/12 1954  GLUCAP 108* 130* 151* 135* 145*      Studies/Results: Dg Abd 1 View  05/31/2012  *RADIOLOGY REPORT*  Clinical Data: Nasogastric tube placement with fluoroscopic guidance by the fluoroscopic technologist.  ABDOMEN - 1 VIEW  Comparison: Earlier today.   Findings: Interval nasogastric tube with its tip in the mid stomach.  Lumbar spine degenerative changes.  IMPRESSION: Nasogastric tube tip in the mid stomach.  Original Report Authenticated By: Darrol Angel, M.D.   Ct Abdomen Pelvis W Contrast  05/28/2012  *RADIOLOGY REPORT*  Clinical Data: Abdominal pain  CT ABDOMEN AND PELVIS WITH CONTRAST  Technique:  Multidetector CT imaging of the abdomen and pelvis was performed following the standard protocol during bolus administration of intravenous contrast.  Contrast: OMNIPAQUE IOHEXOL 300 MG/ML  SOLN  Comparison: 03/25/2012  Findings: Tiny bilateral pleural effusions.  Bibasilar atelectasis.  Cardiomegaly  Hiatal hernia  The stomach and several small bowel loops are distended.  Distal ileum is relatively decompressed.  The transition point is in the right lower quadrant without conspicuous cause.  Small amount of free fluid is seen in the right paracolic gutter. Free fluid is seen layering in the pelvis.  No loculated abscess.  Diverticulosis of the descending colon is noted.  There is noticeable stranding in the fat adjacent to the descending colon as well as in the omentum.  Peritoneal carcinomatosis has a similar appearance.  This may represent edema or inflammatory changes.  Benign appearing renal cysts.  Pancreatic atrophy.  Pancreatic and common bile duct are prominent but this might simply reflect bowel obstruction.  Duodenal diverticuli are present.  Nonspecific hypodensity at the dome of the liver is stable.  IMPRESSION:  Small bowel obstruction pattern.  The transition point is in the distal ileum.  There is stranding within the fat adjacent to the descending colon and omentum.  This might reflect edema, inflammatory changes, or peritoneal carcinomatosis.  Colonic diverticulosis is associated.  Original Report Authenticated By: Donavan Burnet, M.D.   Dg Abd 2 Views  06/01/2012  *RADIOLOGY REPORT*  Clinical Data: Small bowel obstruction  ABDOMEN - 2  VIEW  Findings:  An enteric tube overlies the left upper abdominal quadrant, tip overlying the expected location of the gastric antrum. There is an overall paucity of bowel gas.  Moderate colonic stool burden.  No pneumoperitoneum, pneumatosis or portal venous gas.  Limited visualization lower thorax demonstrate small bilateral effusions and bibasilar heterogeneous / consolidative opacities.  Lumbar spine and bilateral hip degenerative change, right greater than left.  Right-sided SI joint degenerative change.  Vascular calcifications.  IMPRESSION: 1.  Paucity of bowel gas without definite evidence of obstruction.  2. Small bilateral pleural effusions and basilar heterogeneous opacities, atelectasis versus infiltrate.  Further evaluation with dedicated chest radiograph may be performed as clinically indicated.  This was made a call report.  Original Report Authenticated By: Waynard Reeds, M.D.   Dg Abd 2 Views  05/30/2012  *RADIOLOGY REPORT*  Clinical Data: Abdominal discomfort.  Distention.  ABDOMEN - 2 VIEW  Comparison: CT abdomen pelvis 05/28/2012 and abdominal radiograph 05/28/2012.  Findings: There is a relative paucity of gas in the abdomen. Dilated small bowel loops and air-fluid levels are seen in the right upper quadrant.  Curvilinear calcification in the left abdomen corresponds to the wall of a low attenuation lesion off the lower pole left kidney, better seen on 05/28/2012.  Degenerative changes are seen in the spine, sacroiliac joints, hips and symphysis pubis.  Incidental imaging of the chest shows a layering left pleural effusion.  IMPRESSION:  Relative paucity of gas in the abdomen with mildly dilated loops of small bowel in the right upper quadrant and associated air fluid levels, indicating a persistent obstructive pattern.  Original Report Authenticated By: Reyes Ivan, M.D.   Dg Abd 2 Views  05/28/2012  *RADIOLOGY REPORT*  Clinical Data: 78 year old female with abdominal pain and  distention.  Possible small bowel obstruction.  ABDOMEN - 2 VIEW  Comparison: 05/27/2012 and prior abdominal radiographs  Findings: Gas and fluid filled small bowel loops are again identified with the dilated loop in the left lower abdomen/pelvis now not visualized. A small amount of gas in the colon is noted. There is no evidence of pneumoperitoneum. Bibasilar atelectasis and degenerative changes in the lumbar spine and hips are again noted.  IMPRESSION: Nonspecific bowel gas pattern again identified without pneumoperitoneum. Apparent slight improvement of distended bowel loops in the left lower abdomen. Small bowel obstruction is not excluded given these findings.  Original Report Authenticated By: Rosendo Gros, M.D.   Dg Abd 2 Views  05/27/2012  *RADIOLOGY REPORT*  Clinical Data: Altered mental status.  Question free intraperitoneal air.  ABDOMEN - 2 VIEW  Comparison: Two views the abdomen 04/27/2012.  Findings: No free intraperitoneal air is identified.  There are gas filled and mildly dilated loops of small bowel measuring up to 3.4 cm in diameter with air-fluid levels present.  Lumbar scoliosis and degenerative change are noted.  Degenerative disease is also present about the hips, worse on the right.  IMPRESSION:  1.  Findings worrisome for small bowel obstruction. 2.  Negative for free intraperitoneal air.  Original Report Authenticated By: Bernadene Bell. Maricela Curet, M.D.   Dg Abd Portable 2v  05/31/2012  *RADIOLOGY REPORT*  Clinical Data: Vomiting, possible small bowel obstruction.  PORTABLE ABDOMEN - 2 VIEW  Comparison: 05/30/2012  Findings: No interval change in bowel gas pattern.  Relative paucity of small bowel gas in keeping with known fluid filled loops.  Air within mildly dilated bowel loops of the right upper quadrant, similar to prior. The lung bases/hemidiaphragms are essentially excluded from image.  Degenerative changes of the lumbar spine and SI joints.  IMPRESSION: No significant change in  the fluid-filled small bowel obstruction pattern.  Original Report Authenticated By: Waneta Martins, M.D.   Dg Naso G Tube Plc W/fl-no Rad  05/31/2012  CLINICAL DATA: SBO, unable to place tube at bedside   NASO G TUBE PLACEMENT WITH FLUORO  Fluoroscopy was utilized by the requesting physician.  No radiographic  interpretation.      Medications: Scheduled Meds:    . antiseptic oral rinse  15 mL Mouth Rinse q12n4p  . chlorhexidine  15 mL Mouth Rinse BID  . heparin  5,000 Units Subcutaneous Q8H  . insulin aspart  0-9 Units Subcutaneous Q4H  . levothyroxine  26 mcg Intravenous Daily  . pantoprazole (PROTONIX) IV  40 mg Intravenous Q24H  . Travoprost (BAK Free)  1 drop Both Eyes QHS   Continuous Infusions:    . dextrose 5 % and 0.45 % NaCl with KCl 20 mEq/L 100 mL/hr at 06/06/12 2014     Assessment/Plan: Principal Problem:  *SBO (small bowel obstruction) no significant improvement: status post surgery on 06/03/12 - s/p an exploratory laparotomy, SBO secondary to adhesive band - Further management per [facility] surgery (now primary)  Abnormal EKG: Does not appear to be afib, patient has marked sinus arrhythmia with PVCs however T wave inversions are concerning which are new, no chest pain or shortness of breath - Stat BMET obtained, shows no significant electrolyte abnormalities - Order 2-D echocardiogram for ischemia workup, patient is n.p.o., limited options, placed on IV PRN Lopressor for elevated BP or tachycardia.   AKI (acute kidney injury): Resolved, likely prerenal     DM (diabetes mellitus), type 2 with complications: BS in stable range - Continue CBGs q4 hours, continue sliding scale insulin   Hypertension: Stable    Hypothyroidism: - Continue IV Synthroid, TSH 3.9  Leukocytosis: resolved, possibly stress leukocytosis/demargination, afebrile  DVT Prophylaxis:Heparin subcutaneous     LOS: 11 days   Aster Eckrich M.D. [facility] 06/07/2012, 3:59  PM Pager: [phone]  If 7PM-7AM, please contact night-coverage [email or web address] Password [email or web address]

## 2012-06-07 NOTE — Progress Notes (Signed)
Physical Therapy Treatment Patient Details Name: Megan [name] MRN: [id number] DOB: Feb 08, 1927 Today's Date: 06/07/2012 Time: 4098-1191 PT Time Calculation (min): 39 min  PT Assessment / Plan / Recommendation Comments on Treatment Session  Ambulated in hallway today! Good progress with mobility; Still recommending SNF ffor rehab, but if family is able to provide 24 hour assist to pt, she can go home with HHPT follow-up (which is clearly what the pt would prefer); Updated SW    Follow Up Recommendations  Skilled nursing facility;Supervision/Assistance - 24 hour (maybe home if family can arrange for 24 hour assist)    Barriers to Discharge        Equipment Recommendations  Rolling walker with 5" wheels;3 in 1 bedside comode;Tub/shower seat    Recommendations for Other Services OT consult  Frequency Min 3X/week   Plan Discharge plan remains appropriate    Precautions / Restrictions Precautions Precautions: Fall Precaution Comments: Pt also with NGT   Pertinent Vitals/Pain Denies pain    Mobility  Bed Mobility Bed Mobility: Supine to Sit;Sitting - Scoot to Edge of Bed Supine to Sit: 4: Min assist;With rails;HOB elevated (slightly) Sitting - Scoot to Edge of Bed: 4: Min assist;With rail Details for Bed Mobility Assistance: Needign cues to initiate and follow-through with mivement Transfers Transfers: Sit to Stand;Stand to Sit Sit to Stand: 4: Min guard;From bed;With upper extremity assist Stand to Sit: 4: Min guard;With armrests;To chair/3-in-1 Details for Transfer Assistance: Cues for safety and hand placement; Pt very much wanting to do task of standing independently, but does pull up on RW Ambulation/Gait Ambulation/Gait Assistance: 4: Min assist Ambulation Distance (Feet): 100 Feet Assistive device: Rolling walker Ambulation/Gait Assistance Details: Second person to push chair behind for safety as this was the first time walking since surgery; Overall tolerated walking  well, with occasional cues for posture Gait Pattern: Trunk flexed    Exercises     PT Diagnosis:    PT Problem List:   PT Treatment Interventions:     PT Goals Acute Rehab PT Goals Time For Goal Achievement: 06/20/12 Potential to Achieve Goals: Good Pt will go Supine/Side to Sit: with modified independence PT Goal: Supine/Side to Sit - Progress: Progressing toward goal Pt will go Sit to Stand: with modified independence PT Goal: Sit to Stand - Progress: Progressing toward goal Pt will go Stand to Sit: with modified independence PT Goal: Stand to Sit - Progress: Progressing toward goal Pt will Ambulate: >150 feet;with modified independence;with least restrictive assistive device PT Goal: Ambulate - Progress: Progressing toward goal  Visit Information  Last PT Received On: 06/07/12 Assistance Needed: +1    Subjective Data  Subjective: Agreeable to OOB Patient Stated Goal: Wants to be out of the hospital   Cognition  Overall Cognitive Status: Appears within functional limits for tasks assessed/performed Arousal/Alertness: Awake/alert Orientation Level: Appears intact for tasks assessed Behavior During Session: [facility] for tasks performed    Balance     End of Session PT - End of Session Equipment Utilized During Treatment:  (second person pushing chair behind) Activity Tolerance: Patient tolerated treatment well Patient left: in chair;with chair alarm set;with call bell/phone within reach Nurse Communication: Mobility status (Need to hook NGT back to wall)   GP     Olen Pel [place], [place] [phone]  06/07/2012, 3:14 PM

## 2012-06-07 NOTE — Progress Notes (Signed)
Pt refused to go to X ray to get the X ray done.

## 2012-06-07 NOTE — Progress Notes (Signed)
Utilization review completed.  

## 2012-06-07 NOTE — Clinical Social Work Psychosocial (Signed)
     Clinical Social Work Department BRIEF PSYCHOSOCIAL ASSESSMENT 06/07/2012  Patient:  Megan [name], Megan [name]     Account Number:  [id number]     Admit date:  05/27/2012  Clinical Social Worker:  Delmer Islam  Date/Time:  06/07/2012 05:10 AM  Referred by:  Physician  Date Referred:  06/06/2012 Referred for  SNF Placement   Other Referral:   Interview type:  Patient Other interview type:   CSW also spoke briefly (w/patient's permission) to patient's daughter-in-law Benn Moulder (wife to patient's son Gabriel Rung).    PSYCHOSOCIAL DATA Living Status:  HUSBAND Admitted from facility:   Level of care:   Primary support name:   Primary support relationship to patient:   Degree of support available:   *Patient's husband is in the home but has his own health issues.  *Patient has 9 children (1 deceased) and her husband  has 9 children. *Some of her children live in [place] and patient talked about 1 daughter1 who lives in [place].    CURRENT CONCERNS Current Concerns  Post-Acute Placement   Other Concerns:   CSW talked with patient about home with 24 hr supervision vs SNF.    SOCIAL WORK ASSESSMENT / PLAN CSW talked with patient about discharge plans and my conversation with the physical therapist and her recommendation of home with 2/7 supervision vs SNF if 24/7 care can't be provided.    Patient stated that her 2 youngest daughters Renea Ee Ratliff & Warnell Bureau) plan to stay with her a week each and her daughter in [place] plans to come at the end of August to be with her. When asked patient could not give CSW phone numbers, however daughter-in-law Lendell Caprice was given CSW's card and will give my number to patient's daughters.   Assessment/plan status:  Psychosocial Support/Ongoing Assessment of Needs Other assessment/ plan:   Information/referral to community resources:    PATIENTS/FAMILYS RESPONSE TO PLAN OF CARE: Patient is very alert, friendly and open to talking  with CSW. Her plan is to discharge home with family support.    CSW will talk with daughters to talk with them about patient's need for 24/7 supervision and confirm plan.

## 2012-06-08 DIAGNOSIS — I359 Nonrheumatic aortic valve disorder, unspecified: Secondary | ICD-10-CM

## 2012-06-08 LAB — GLUCOSE, CAPILLARY
Glucose-Capillary: 145 mg/dL — ABNORMAL HIGH (ref 70–99)
Glucose-Capillary: 112 mg/dL — ABNORMAL HIGH (ref 70–99)
Glucose-Capillary: 126 mg/dL — ABNORMAL HIGH (ref 70–99)

## 2012-06-08 LAB — CARDIAC PANEL(CRET KIN+CKTOT+MB+TROPI)
CK, MB: 1.5 ng/mL (ref 0.3–4.0)
Relative Index: INVALID (ref 0.0–2.5)
Total CK: 31 U/L (ref 7–177)
Troponin I: <0.3 ng/mL (ref <0.30)

## 2012-06-08 LAB — BASIC METABOLIC PANEL
BUN: <3 mg/dL — ABNORMAL LOW (ref 6–23)
CO2: 30 mEq/L (ref 19–32)
Calcium: 8.5 mg/dL (ref 8.4–10.5)
Chloride: 102 mEq/L (ref 96–112)
Creatinine, Ser: 0.55 mg/dL (ref 0.50–1.10)
GFR calc Af Amer: >90 mL/min (ref >90)
GFR calc non Af Amer: 85 mL/min — ABNORMAL LOW (ref >90)
Glucose, Bld: 158 mg/dL — ABNORMAL HIGH (ref 70–99)
Potassium: 3.2 mEq/L — ABNORMAL LOW (ref 3.5–5.1)
Sodium: 137 mEq/L (ref 135–145)

## 2012-06-08 LAB — CBC
Hemoglobin: 9.7 g/dL — ABNORMAL LOW (ref 12.0–15.0)
MCH: 28 pg (ref 26.0–34.0)
MCHC: 33.3 g/dL (ref 30.0–36.0)
Platelets: 159 10*3/uL (ref 150–400)

## 2012-06-08 MED ORDER — HYDRALAZINE HCL 20 MG/ML IJ SOLN
10.0000 mg | Freq: Three times a day (TID) | INTRAMUSCULAR | Status: DC | PRN
  Administered 2012-06-09: 10 mg via INTRAVENOUS
  Filled 2012-06-08 (×2): qty 0.5

## 2012-06-08 MED ORDER — POTASSIUM CHLORIDE 10 MEQ/100ML IV SOLN
10.0000 meq | INTRAVENOUS | Status: AC
  Administered 2012-06-08 (×3): 10 meq via INTRAVENOUS
  Filled 2012-06-08 (×3): qty 100

## 2012-06-08 NOTE — Progress Notes (Addendum)
HR dropped to 40s unsustained on telemetry. On assessment of pt, BP 159/77, pulse 62. Pt stable other than headache. Dr. Arbutus Leas text paged. Will continue to monitor. [name], [name]

## 2012-06-08 NOTE — Progress Notes (Signed)
  Echocardiogram 2D Echocardiogram has been performed.  Megan [name] 06/08/2012, 2:45 PM

## 2012-06-08 NOTE — Progress Notes (Signed)
Subjective: Patient denies abdominal pain.  No nausea, no chest pain.  Objective: Filed Vitals:   June 13, 2012 2058 06/08/12 0548 06/08/12 0900 06/08/12 0950  BP: 156/64 147/66 171/79 159/77  Pulse: 86 87 95 62  Temp: 99 F (37.2 C) 97.4 F (36.3 C) 98.4 F (36.9 C)   TempSrc: Oral Oral Oral   Resp: 17 18 18    Height:      Weight:      SpO2: 96% 95% 100%    Weight change:    General: Alert, awake, oriented x3, in no acute distress.  HEENT: No bruits, no goiter.  Heart: Regular rate and rhythm, without murmurs, rubs, gallops.  Lungs: CTA, bilateral air movement.  Abdomen: Soft, nontender, nondistended, positive bowel sounds.  Extremities; No edema.    Lab Results:  Basename 06/08/12 0500 June 13, 2012 0944  NA 137 136  K 3.2* 3.5  CL 102 101  CO2 30 29  GLUCOSE 158* 106*  BUN <3* 4*  CREATININE 0.55 0.61  CALCIUM 8.5 8.8  MG -- 1.4*  PHOS -- --   Basename 06/08/12 0500 06/06/12 0500  WBC 8.3 9.8  NEUTROABS -- --  HGB 9.7* 10.4*  HCT 29.1* 30.9*  MCV 84.1 84.9  PLT 159 160    Basename 06/08/12 1351  CKTOTAL 31  CKMB 1.5  CKMBINDEX --  TROPONINI <0.30   Micro Results: No results found for this or any previous visit (from the past 240 hour(s)).  Studies/Results: Dg Abd 2 Views  13-Jun-2012  *RADIOLOGY REPORT*  Clinical Data: Evaluate ileus.  ABDOMEN - 2 VIEW  Comparison: Abdominal radiograph 06/03/2012.  Findings: There is a paucity of bowel gas, however, there does appear be some distal colonic and rectal gas and stool.  No definite pneumoperitoneum.  A focal collection of gas in the right upper quadrant of the abdomen is of uncertain etiology and significance, but does appear to have an air-fluid level in it, particularly on the right lateral decubitus view.  Nasogastric tube is seen extending to the proximal stomach.  Surgical staples are seen projecting over the right lower quadrant of the abdomen.  IMPRESSION: 1.  Findings of a bowel obstruction noted on the prior  study have resolved. 2. There is now a collection of gas and fluid projecting over the right upper quadrant of the abdomen that is of uncertain etiology and significance.  Conceivably, this could represent gas within the hepatic flexure of the colon, however, the appearance is unusual, and the possibility of a focal gas and fluid collection (such as a postoperative collection or early developing abscess) is difficult to exclude.  At the very least, attention on follow-up radiographs is recommended.  Alternatively, this could be further evaluated with a contrast enhanced CT of the abdomen at this time if clinically indicated. 3.  Postoperative changes and support apparatus, as above.  Original Report Authenticated By: Florencia Reasons, M.D.    Medications: I have reviewed the patient's current medications.  1-SBO (small bowel obstruction): status post surgery on 06/03/12  - s/p an exploratory laparotomy, SBO secondary to adhesive band.  - Further management per [facility] surgery (now primary)   2-Abnormal EKG: Does not appear to be afib, patient has marked sinus arrhythmia with PVCs however T wave inversions are concerning which are new, no chest pain or shortness of breath  -ECHO pending. -Check Cardiac enzymes.  -Repeat EKG am.  -Patient denies chest pain or dyspnea.   3-AKI (acute kidney injury): Resolved, likely prerenal  4-DM (diabetes  mellitus), type 2 with complications:  - Continue CBGs q4 hours, continue sliding scale insulin  5-Hypertension: PRN Hydralazine.  6-Hypothyroidism:  - Continue IV Synthroid, TSH 3.9  7-Leukocytosis: resolved, possibly stress leukocytosis/demargination, afebrile  8-DVT Prophylaxis:Heparin subcutaneous  9-Hypokalemia: replace with IV ckl.     LOS: 12 days   Nazanin Kinner M.D.  [facility] 06/08/2012, 4:35 PM

## 2012-06-08 NOTE — Clinical Social Work Note (Addendum)
CSW talked with patient's son Joe regarding talking with his sisters Renea Ee and Scarlette Calico. CSW spoke with both daughters, Warnell Bureau ([phone]) and Lyndee Hensen [phone]) regarding care arrangements at discharge. Both daughters confirmed that they will be taking turns being with patient and this was already in place before she came to the hospital.  Nurse care manager Drinda Butts talked with Ms. Wood regarding home health services and DME. CSW was advised that brother Gabriel Rung [phone]) can be contacted when patient ready for discharge (if no family already there).  CSW signing off as no CSW needs at this time.  Genelle Bal, MSW, LCSW [phone]

## 2012-06-08 NOTE — Progress Notes (Signed)
Physical Therapy Treatment Patient Details Name: Megan [name] MRN: [id number] DOB: May 10, 1927 Today's Date: 06/08/2012 Time: 1533-1600 PT Time Calculation (min): 27 min  PT Assessment / Plan / Recommendation Comments on Treatment Session  Continuing progress with amb and acitivity; Noted family is able to provide 24 hour assist at dc, so will update dc plan to home with family assist prn; Pt ok to walk in hallway with staff/famiy -- just instructed family to notify staff, so they can arrange IV pole, etc    Follow Up Recommendations  Home health PT;Supervision/Assistance - 24 hour    Barriers to Discharge        Equipment Recommendations  Rolling walker with 5" wheels;3 in 1 bedside comode;Tub/shower seat    Recommendations for Other Services    Frequency Min 3X/week   Plan Discharge plan needs to be updated    Precautions / Restrictions Precautions Precautions: Fall Precaution Comments: Pt also with NGT   Pertinent Vitals/Pain Denies pain    Mobility  Bed Mobility Details for Bed Mobility Assistance: Presented sitting EOB Transfers Transfers: Sit to Stand;Stand to Sit Sit to Stand: 4: Min guard;From bed;With upper extremity assist Stand to Sit: 4: Min guard;With armrests;To chair/3-in-1 Details for Transfer Assistance: Cues for safety and hand placement; Pt very much wanting to do task of standing independently, but does pull up on RW Ambulation/Gait Ambulation/Gait Assistance: 4: Min guard (with and without physical contact) Ambulation Distance (Feet): 160 Feet Assistive device: Rolling walker Ambulation/Gait Assistance Details: Cues to stay close to RW and for fully upright posture Gait Pattern: Trunk flexed    Exercises     PT Diagnosis:    PT Problem List:   PT Treatment Interventions:     PT Goals Acute Rehab PT Goals Time For Goal Achievement: 06/20/12 Potential to Achieve Goals: Good Pt will go Sit to Stand: with modified independence PT Goal: Sit to  Stand - Progress: Progressing toward goal Pt will go Stand to Sit: with modified independence PT Goal: Stand to Sit - Progress: Progressing toward goal Pt will Ambulate: >150 feet;with modified independence;with least restrictive assistive device PT Goal: Ambulate - Progress: Progressing toward goal  Visit Information  Last PT Received On: 06/08/12 Assistance Needed: +1    Subjective Data  Subjective: Wanting to amb Patient Stated Goal: Wants to be out of the hospital   Cognition  Overall Cognitive Status: Appears within functional limits for tasks assessed/performed Arousal/Alertness: Awake/alert Orientation Level: Appears intact for tasks assessed Behavior During Session: [name] for tasks performed    Balance     End of Session PT - End of Session Activity Tolerance: Patient tolerated treatment well Patient left: in chair;with chair alarm set;with family/visitor present Nurse Communication: Mobility status   GP     Olen Pel [place], [place] [phone]  06/08/2012, 4:39 PM

## 2012-06-08 NOTE — Progress Notes (Signed)
Patient ID: Megan [name], female   DOB: 1927-07-14, 78 y.o.   MRN: [id number] 5 Days Post-Op  Subjective: Pt without c/o.  Thinks she may be passing some flatus, but not sure.  Laying in bed and doesn't mobilize much.  Objective: Vital signs in last 24 hours: Temp:  [97.4 F (36.3 C)-99 F (37.2 C)] 97.4 F (36.3 C) (08/07 0548) Pulse Rate:  [86-87] 87  (08/07 0548) Resp:  [17-18] 18  (08/07 0548) BP: (146-156)/(64-70) 147/66 mmHg (08/07 0548) SpO2:  [95 %-96 %] 95 % (08/07 0548) Last BM Date: 06/03/12  Intake/Output from previous day: 07-08-23 0701 - 08/07 0700 In: 2423.3 [I.V.:2423.3] Out: 2500 [Urine:2300; Emesis/NG output:200] Intake/Output this shift:    PE: Abd: soft, minimally tender, few tinkles of BS, ND, NGT with minimal watered-down output. Ht: irregular Lungs: CTAB Abdomen:  Incision looks good.  To leave bandage off.  Has a few BS.  No tenderness.  Lab Results:   Basename 06/08/12 0500 06/06/12 0500  WBC 8.3 9.8  HGB 9.7* 10.4*  HCT 29.1* 30.9*  PLT 159 160   BMET  Basename 06/08/12 0500 Jul 07, 2012 0944  NA 137 136  K 3.2* 3.5  CL 102 101  CO2 30 29  GLUCOSE 158* 106*  BUN <3* 4*  CREATININE 0.55 0.61  CALCIUM 8.5 8.8   PT/INR No results found for this basename: LABPROT:2,INR:2 in the last 72 hours CMP     Component Value Date/Time   NA 137 06/08/2012 0500   K 3.2* 06/08/2012 0500   CL 102 06/08/2012 0500   CO2 30 06/08/2012 0500   GLUCOSE 158* 06/08/2012 0500   BUN <3* 06/08/2012 0500   CREATININE 0.55 06/08/2012 0500   CALCIUM 8.5 06/08/2012 0500   PROT 7.7 05/27/2012 1151   ALBUMIN 3.5 05/27/2012 1151   AST 15 05/27/2012 1151   ALT 6 05/27/2012 1151   ALKPHOS 54 05/27/2012 1151   BILITOT 1.0 05/27/2012 1151   GFRNONAA 85* 06/08/2012 0500   GFRAA >90 06/08/2012 0500   Lipase     Component Value Date/Time   LIPASE 12 05/27/2012 1151       Studies/Results: Dg Abd 2 Views  07/07/12  *RADIOLOGY REPORT*  Clinical Data: Evaluate ileus.  ABDOMEN - 2 VIEW   Comparison: Abdominal radiograph 06/03/2012.  Findings: There is a paucity of bowel gas, however, there does appear be some distal colonic and rectal gas and stool.  No definite pneumoperitoneum.  A focal collection of gas in the right upper quadrant of the abdomen is of uncertain etiology and significance, but does appear to have an air-fluid level in it, particularly on the right lateral decubitus view.  Nasogastric tube is seen extending to the proximal stomach.  Surgical staples are seen projecting over the right lower quadrant of the abdomen.  IMPRESSION: 1.  Findings of a bowel obstruction noted on the prior study have resolved. 2. There is now a collection of gas and fluid projecting over the right upper quadrant of the abdomen that is of uncertain etiology and significance.  Conceivably, this could represent gas within the hepatic flexure of the colon, however, the appearance is unusual, and the possibility of a focal gas and fluid collection (such as a postoperative collection or early developing abscess) is difficult to exclude.  At the very least, attention on follow-up radiographs is recommended.  Alternatively, this could be further evaluated with a contrast enhanced CT of the abdomen at this time if clinically indicated. 3.  Postoperative  changes and support apparatus, as above.  Original Report Authenticated By: Florencia Reasons, M.D.    Anti-infectives: Anti-infectives     Start     Dose/Rate Route Frequency Ordered Stop   06/03/12 1600   ertapenem (INVANZ) 1 g in sodium chloride 0.9 % 50 mL IVPB        1 g 100 mL/hr over 30 Minutes Intravenous  Once 06/03/12 1552 06/03/12 1743           Assessment/Plan  1. S/p ex lap with LOA  enterolysis - B. [name] - 8/2/013  2. New onset a. Fib Patient Active Problem List  Diagnosis  . Peritonitis  . Intra-abdominal free air of unknown etiology  . AKI (acute kidney injury)  . Delirium  . DM (diabetes mellitus), type 2 with  complications  . Hypertension  . Diastolic dysfunction, left ventricle by ECHO 2011  . Arthritis  . Hypothyroidism  . Acute respiratory failure  . Acute delirium  . Nausea with vomiting  . SBO (small bowel obstruction)  . Abnormal EKG    Plan: 1. Films from yesterday initially refused, but taken.  Findings of an air bubble noted but clinically benign.  She has no fever, increase WBC, or pain.  Will follow. 2. Clamp NGT, if tolerates will dc this. 3. Other medical problems per hospitalist. 4. Will need to d/w family her dispo as it's recommended she has either 24 assist at home or SNF.   LOS: 12 days    [name],[name] 06/08/2012  Agree with above.  Ovidio Kin, MD, [facility] Surgery Pager: [phone] Office phone:  [phone]  Agree with above.  I don't know what to make of "gas bubble" on KUB - but patient has no symptoms. Daughter and great grandchildren in room. Passing flatus.  NGT clamped all day.  Needs to ambulate. Seems to be slowly getting better.  Ovidio Kin, MD, [facility] Surgery Pager: [phone] Office phone:  [phone]

## 2012-06-08 NOTE — Progress Notes (Signed)
   CARE MANAGEMENT NOTE 06/08/2012  Patient:  Megan [name], Megan [name]   Account Number:  [id number]  Date Initiated:  06/07/2012  Documentation initiated by:  Donn Pierini  Subjective/Objective Assessment:   Pt admitted  abd pain with coffee ground emesis s/p exp lap on 8/2     Action/Plan:   PTA pt lived at home with family was active with [facility] for HH-RN   Anticipated DC Date:  06/10/2012   Anticipated DC Plan:  SKILLED NURSING FACILITY      DC Planning Services  CM consult      Choice offered to / List presented to:             Status of service:  In process, will continue to follow Medicare Important Message given?   (If response is "NO", the following Medicare IM given date fields will be blank) Date Medicare IM given:   Date Additional Medicare IM given:    Discharge Disposition:    Per UR Regulation:  Reviewed for med. necessity/level of care/duration of stay  If discussed at Long Length of Stay Meetings, dates discussed:   06/07/2012    Comments:  PCP- Lucky Cowboy DAVID  06/08/2012  1200 Darlyne Russian RN, CCM  [phone] Spoke with Thelma Barge (daughter) regarding discharge planning. The family is able to provide care upon discharge. DME in the home: wheelchair, walker, commode chair. Will discuss selection of home care agency.  CM to continue to follow for discharge planning needs.  06/07/12- 1450- Donn Pierini RN, BSN [phone] UR completed, pt post op exp lap with post op ileus, NGT still in place no flatus yet, remains NPO. PT eval pending, Pt may need SNF placement for rehab at discharge. NCM to cont. to follow for d/c planning and needs

## 2012-06-08 NOTE — Progress Notes (Signed)
Nutrition Follow-up  Intervention:   1. Recommend nutrition support if unable to advance diet, pt with prolonged NPO/Clear Liquid diet order and is at risk for malnutrition. 2. RD to continue to follow  Assessment:   Underwent exp lap and lysis of adhesions on 8/2. RN reports NGT currently clamped. Remains NPO.  Pt is at risk for malnutrition given prolonged NPO/Clear Liquid status. Unable to assess weight status given highly fluctuating weights during this admission. Pt was admitted at 140 lb and is currently 141 lb.  Diet Order:  NPO Supplements: discontinued  Meds: Scheduled Meds:   . antiseptic oral rinse  15 mL Mouth Rinse q12n4p  . chlorhexidine  15 mL Mouth Rinse BID  . heparin  5,000 Units Subcutaneous Q8H  . insulin aspart  0-9 Units Subcutaneous Q4H  . levothyroxine  26 mcg Intravenous Daily  . pantoprazole (PROTONIX) IV  40 mg Intravenous Q24H  . Travoprost (BAK Free)  1 drop Both Eyes QHS  . DISCONTD: metoprolol  2.5 mg Intravenous Q8H   Continuous Infusions:   . dextrose 5 % and 0.45 % NaCl with KCl 20 mEq/L 100 mL/hr at 06/08/12 0700   PRN Meds:.acetaminophen, acetaminophen, HYDROmorphone (DILAUDID) injection, metoprolol, morphine injection, ondansetron (ZOFRAN) IV, ondansetron, phenol, polyvinyl alcohol, sodium chloride  Labs:  CMP     Component Value Date/Time   NA 137 06/08/2012 0500   K 3.2* 06/08/2012 0500   CL 102 06/08/2012 0500   CO2 30 06/08/2012 0500   GLUCOSE 158* 06/08/2012 0500   BUN <3* 06/08/2012 0500   CREATININE 0.55 06/08/2012 0500   CALCIUM 8.5 06/08/2012 0500   PROT 7.7 05/27/2012 1151   ALBUMIN 3.5 05/27/2012 1151   AST 15 05/27/2012 1151   ALT 6 05/27/2012 1151   ALKPHOS 54 05/27/2012 1151   BILITOT 1.0 05/27/2012 1151   GFRNONAA 85* 06/08/2012 0500   GFRAA >90 06/08/2012 0500     Intake/Output Summary (Last 24 hours) at 06/08/12 1100 Last data filed at 06/08/12 1041  Gross per 24 hour  Intake 2423.33 ml  Output   2125 ml  Net 298.33 ml  BM on  8/2  Weight Status:  64.4 kg, wt consistent with admission weight  Re-estimated needs:  1400 - 1600 kcal, 65 - 75 grams protein  Nutrition Dx:  Inadequate protein energy intake r/t diet provision AEB clear liquid diet provides minimal kcal and protein. Ongoing.  Goal:  Diet advance as medically able - unmet  Monitor:  Diet advancement, weights, labs  Jarold Motto MS, RD, LDN Pager: [phone] After-hours pager: [phone]

## 2012-06-09 LAB — BASIC METABOLIC PANEL
Calcium: 8.6 mg/dL (ref 8.4–10.5)
Chloride: 105 mEq/L (ref 96–112)
Creatinine, Ser: 0.55 mg/dL (ref 0.50–1.10)
GFR calc Af Amer: >90 mL/min (ref >90)
GFR calc non Af Amer: 85 mL/min — ABNORMAL LOW (ref >90)

## 2012-06-09 LAB — GLUCOSE, CAPILLARY
Glucose-Capillary: 127 mg/dL — ABNORMAL HIGH (ref 70–99)
Glucose-Capillary: 115 mg/dL — ABNORMAL HIGH (ref 70–99)
Glucose-Capillary: 112 mg/dL — ABNORMAL HIGH (ref 70–99)

## 2012-06-09 MED ORDER — PANTOPRAZOLE SODIUM 40 MG PO TBEC
40.0000 mg | DELAYED_RELEASE_TABLET | Freq: Every day | ORAL | Status: DC
  Administered 2012-06-09 – 2012-06-11 (×3): 40 mg via ORAL
  Filled 2012-06-09 (×4): qty 1

## 2012-06-09 MED ORDER — LEVOTHYROXINE SODIUM 50 MCG PO TABS
50.0000 ug | ORAL_TABLET | Freq: Every day | ORAL | Status: DC
  Administered 2012-06-09 – 2012-06-11 (×3): 50 ug via ORAL
  Filled 2012-06-09 (×3): qty 1

## 2012-06-09 MED ORDER — AMLODIPINE BESYLATE 2.5 MG PO TABS
2.5000 mg | ORAL_TABLET | Freq: Every day | ORAL | Status: DC
  Administered 2012-06-09 – 2012-06-11 (×3): 2.5 mg via ORAL
  Filled 2012-06-09 (×3): qty 1

## 2012-06-09 MED ORDER — BOOST / RESOURCE BREEZE PO LIQD
1.0000 | Freq: Three times a day (TID) | ORAL | Status: DC
  Administered 2012-06-09 (×2): 1 via ORAL

## 2012-06-09 NOTE — Progress Notes (Signed)
Nutrition Follow-up  Intervention:   1. Resource Breeze po TID, each supplement provides 250 kcal and 9 grams of protein. 2. Recommend nutrition support if unable to advance diet, pt with prolonged NPO/Clear Liquid diet order and is at risk for malnutrition. 3. RD to continue to follow  Assessment:   Pt advanced to clear liquids today. Able to consume 75% of breakfast. NGT clamped. Pt refusing PT and OT.  Diet Order: Clear Liquids Supplements: none  Meds: Scheduled Meds:    . antiseptic oral rinse  15 mL Mouth Rinse q12n4p  . chlorhexidine  15 mL Mouth Rinse BID  . heparin  5,000 Units Subcutaneous Q8H  . insulin aspart  0-9 Units Subcutaneous Q4H  . levothyroxine  26 mcg Intravenous Daily  . pantoprazole (PROTONIX) IV  40 mg Intravenous Q24H  . potassium chloride  10 mEq Intravenous Q1 Hr x 3  . Travoprost (BAK Free)  1 drop Both Eyes QHS   Continuous Infusions:    . dextrose 5 % and 0.45 % NaCl with KCl 20 mEq/L 100 mL/hr at 06/09/12 1100   PRN Meds:.acetaminophen, acetaminophen, hydrALAZINE, morphine injection, ondansetron (ZOFRAN) IV, ondansetron, phenol, polyvinyl alcohol, sodium chloride, DISCONTD:  HYDROmorphone (DILAUDID) injection  Labs:  CMP     Component Value Date/Time   NA 138 06/09/2012 0614   K 3.5 06/09/2012 0614   CL 105 06/09/2012 0614   CO2 28 06/09/2012 0614   GLUCOSE 87 06/09/2012 0614   BUN <3* 06/09/2012 0614   CREATININE 0.55 06/09/2012 0614   CALCIUM 8.6 06/09/2012 0614   PROT 7.7 05/27/2012 1151   ALBUMIN 3.5 05/27/2012 1151   AST 15 05/27/2012 1151   ALT 6 05/27/2012 1151   ALKPHOS 54 05/27/2012 1151   BILITOT 1.0 05/27/2012 1151   GFRNONAA 85* 06/09/2012 0614   GFRAA >90 06/09/2012 0614     Intake/Output Summary (Last 24 hours) at 06/09/12 1501 Last data filed at 06/09/12 1100  Gross per 24 hour  Intake    510 ml  Output    775 ml  Net   -265 ml  BM on 8/2  Weight Status:  64.4 kg, wt consistent with admission weight  Re-estimated needs:  1400 - 1600  kcal, 65 - 75 grams protein  Nutrition Dx:  Inadequate protein energy intake r/t diet provision AEB clear liquid diet provides minimal kcal and protein. Ongoing.  Goal:  Diet advance as medically able - unmet  Monitor:  Diet advancement, weights, labs  Jarold Motto MS, RD, LDN Pager: [phone] After-hours pager: [phone]

## 2012-06-09 NOTE — Progress Notes (Signed)
Patient ID: WAHNETA DEROCHER, female   DOB: 1926/11/29, 78 y.o.   MRN: [id number] Patient ID: DALIA JOLLIE, female   DOB: 05/15/1927, 78 y.o.   MRN: [id number] 6 Days Post-Op  Subjective: Pt without c/o.  Laying in bed and doesn't mobilize much.  Objective: Vital signs in last 24 hours: Temp:  [98.1 F (36.7 C)-98.8 F (37.1 C)] 98.1 F (36.7 C) (08/08 0923) Pulse Rate:  [62-102] 65  (08/08 0923) Resp:  [18] 18  (08/08 0923) BP: (127-163)/(66-85) 134/74 mmHg (08/08 0923) SpO2:  [95 %-100 %] 96 % (08/08 0923) Weight:  [141 lb 15.6 oz (64.4 kg)] 141 lb 15.6 oz (64.4 kg) (08/07 2027) Last BM Date: 06/03/12  Intake/Output from previous day: 08/07 0701 - 08/08 0700 In: 100 [I.V.:100] Out: 1275 [Urine:1275] Intake/Output this shift: Total I/O In: 10 [I.V.:10] Out: -   General appearance: Abd: soft, non tender, minimal BS, non distended, no N/V, NG has been removed.  Cardiac: irregular Lungs: CTA bilaterally  Lab Results:   Basename 06/08/12 0500  WBC 8.3  HGB 9.7*  HCT 29.1*  PLT 159   BMET  Basename 06/09/12 0614 06/08/12 0500  NA 138 137  K 3.5 3.2*  CL 105 102  CO2 28 30  GLUCOSE 87 158*  BUN <3* <3*  CREATININE 0.55 0.55  CALCIUM 8.6 8.5   PT/INR No results found for this basename: LABPROT:2,INR:2 in the last 72 hours CMP     Component Value Date/Time   NA 138 06/09/2012 0614   K 3.5 06/09/2012 0614   CL 105 06/09/2012 0614   CO2 28 06/09/2012 0614   GLUCOSE 87 06/09/2012 0614   BUN <3* 06/09/2012 0614   CREATININE 0.55 06/09/2012 0614   CALCIUM 8.6 06/09/2012 0614   PROT 7.7 05/27/2012 1151   ALBUMIN 3.5 05/27/2012 1151   AST 15 05/27/2012 1151   ALT 6 05/27/2012 1151   ALKPHOS 54 05/27/2012 1151   BILITOT 1.0 05/27/2012 1151   GFRNONAA 85* 06/09/2012 0614   GFRAA >90 06/09/2012 0614   Lipase     Component Value Date/Time   LIPASE 12 05/27/2012 1151       Studies/Results: Dg Abd 2 Views  2012-07-04  *RADIOLOGY REPORT*  Clinical Data: Evaluate ileus.  ABDOMEN - 2  VIEW  Comparison: Abdominal radiograph 06/03/2012.  Findings: There is a paucity of bowel gas, however, there does appear be some distal colonic and rectal gas and stool.  No definite pneumoperitoneum.  A focal collection of gas in the right upper quadrant of the abdomen is of uncertain etiology and significance, but does appear to have an air-fluid level in it, particularly on the right lateral decubitus view.  Nasogastric tube is seen extending to the proximal stomach.  Surgical staples are seen projecting over the right lower quadrant of the abdomen.  IMPRESSION: 1.  Findings of a bowel obstruction noted on the prior study have resolved. 2. There is now a collection of gas and fluid projecting over the right upper quadrant of the abdomen that is of uncertain etiology and significance.  Conceivably, this could represent gas within the hepatic flexure of the colon, however, the appearance is unusual, and the possibility of a focal gas and fluid collection (such as a postoperative collection or early developing abscess) is difficult to exclude.  At the very least, attention on follow-up radiographs is recommended.  Alternatively, this could be further evaluated with a contrast enhanced CT of the abdomen at this time if clinically  indicated. 3.  Postoperative changes and support apparatus, as above.  Original Report Authenticated By: Florencia Reasons, M.D.    Anti-infectives: Anti-infectives     Start     Dose/Rate Route Frequency Ordered Stop   06/03/12 1600   ertapenem (INVANZ) 1 g in sodium chloride 0.9 % 50 mL IVPB        1 g 100 mL/hr over 30 Minutes Intravenous  Once 06/03/12 1552 06/03/12 1743           Assessment/Plan  1. S/p ex lap with LOA   .Enterolysis - B. [name] - 8/2/013  2. New onset A. Fib (being managed by medicine team)  Patient Active Problem List  Diagnosis  . Peritonitis  . Intra-abdominal free air of unknown etiology  . AKI (acute kidney injury)  . Delirium  . DM  (diabetes mellitus), type 2 with complications  . Hypertension  . Diastolic dysfunction, left ventricle by ECHO 2011  . Arthritis  . Hypothyroidism  . Acute respiratory failure  . Acute delirium  . Nausea with vomiting  . SBO (small bowel obstruction)  . Abnormal EKG   Plan: 1. Medical management per hospitalist. 2. Will need to d/w family her dispo as it's recommended she has either 24 assist at home or SNF.  3. ? Start with sips of clears (will discuss with Dr. Ezzard Standing).   LOS: 13 days  DUANNE, JEREMY 06/09/2012  NGT out.  Started on clear liquids. Slowly improving.  Ovidio Kin, MD, [facility] Surgery Pager: [phone] Office phone:  [phone]

## 2012-06-09 NOTE — Progress Notes (Signed)
PT Cancellation Note  Treatment cancelled today due to patient's refusal to participate. Pt stated that she is too tired and will participate in therapy tomorrow.  06/09/2012 Milana Kidney DPT PAGER: [phone] OFFICE: [phone]    Milana Kidney 06/09/2012, 2:17 PM

## 2012-06-09 NOTE — Progress Notes (Signed)
Subjective: Feeling well, wants to go home. Tolerates some jello.  Think she is passing flatus. No BM.  Objective: Filed Vitals:   06/08/12 2027 06/09/12 0403 06/09/12 0923 06/09/12 1332  BP: 163/85 127/66 134/74 146/79  Pulse: 102 67 65 67  Temp: 98.1 F (36.7 C) 98.6 F (37 C) 98.1 F (36.7 C) 98.7 F (37.1 C)  TempSrc: Oral Oral Axillary Oral  Resp: 18 18 18 18   Height:      Weight: 64.4 kg (141 lb 15.6 oz)     SpO2: 95% 96% 96% 100%   Weight change:    General: Alert, awake, oriented x3, in no acute distress.  HEENT: No bruits, no goiter.  Heart: Regular rate and rhythm, without murmurs, rubs, gallops.  Lungs: CTA, bilateral air movement.  Abdomen: Soft, nontender, nondistended, positive bowel sounds, incision appears clean healing. .  Extremities; no edema.    Lab Results:  Basename 06/09/12 0614 06/08/12 0500 06/07/12 0944  NA 138 137 --  K 3.5 3.2* --  CL 105 102 --  CO2 28 30 --  GLUCOSE 87 158* --  BUN <3* <3* --  CREATININE 0.55 0.55 --  CALCIUM 8.6 8.5 --  MG -- -- 1.4*  PHOS -- -- --    Basename 06/08/12 0500  WBC 8.3  NEUTROABS --  HGB 9.7*  HCT 29.1*  MCV 84.1  PLT 159    Basename 06/08/12 1351  CKTOTAL 31  CKMB 1.5  CKMBINDEX --  TROPONINI <0.30   Medications: I have reviewed the patient's current medications.  1-SBO (small bowel obstruction): status post surgery on 06/03/12  - s/p an exploratory laparotomy, SBO secondary to adhesive band.  - Further management per [facility] surgery (now primary)   2-Abnormal EKG: Does not appear to be afib, patient has marked sinus arrhythmia with PVCs however T wave inversions are concerning which are new, no chest pain or shortness of breath. EKG changes probably related to electrolytes abnormalities.  -ECHO, Normal,  Left ventricle: The cavity size was normal. Wall thickness was increased in a pattern of mild LVH. Systolic function was normal. The estimated ejection fraction was in the range of 55% to  60%. Aortic valve: Mild regurgitation. Mitral valve: Calcified annulus. Mild regurgitation. Atrial septum: No defect or patent foramen ovale was identified. Repeated EKG no T wave inversion, occasional PVC.  - Cardiac enzymes negative.  -Patient denies chest pain or dyspnea.  3-AKI (acute kidney injury): Resolved, likely prerenal  4-DM (diabetes mellitus), type 2 with complications:  - Continue CBGs q4 hours, continue sliding scale insulin  5-Hypertension: Will resume home medication. Will hold lisinopril due to initial episode of renal insufficiency.  6-Hypothyroidism:  -Change synthroid to oral.  7-Leukocytosis: resolved, possibly stress leukocytosis/demargination, afebrile  8-DVT Prophylaxis:Heparin subcutaneous  9-Hypokalemia: replaced  Frequent ambulation.  Disposition; per surgery.       LOS: 13 days   Teasia Zapf M.D.  [facility] 06/09/2012, 4:11 PM

## 2012-06-09 NOTE — Progress Notes (Signed)
Occupational Therapy Treatment Patient Details Name: Megan [name] MRN: [id number] DOB: 09-27-1927 Today's Date: 06/09/2012 Time: 4098-1191 OT Time Calculation (min): 11 min  OT Assessment / Plan / Recommendation Comments on Treatment Session Pt presented either ill-tempered or confused. Pt refused to answer any questions regarding home or complete any functional activities except to complete toileting (pt presented on 3n1). Pt repeatedly saying, "You're nosey. Why are you asking all these questions?" despite education on OT and my role in her stay and d/c home. Pt alluded to fact that family is not around 24hrs- but pt questionable historian. Strongly encourage 24hr supervision upon d/c    Follow Up Recommendations  Home health OT;Skilled nursing facility;Other (comment)    Barriers to Discharge       Equipment Recommendations  Rolling walker with 5" wheels;3 in 1 bedside comode;Tub/shower seat    Recommendations for Other Services    Frequency     Plan Discharge plan remains appropriate    Precautions / Restrictions Precautions Precautions: Fall Restrictions Weight Bearing Restrictions: No   Pertinent Vitals/Pain Pt neither confirmed nor denied any pain during session    ADL  Grooming: Performed;Wash/dry hands;Min guard Where Assessed - Grooming: Supported standing Lower Body Dressing: Simulated;Min guard (don/doff socks) Toilet Transfer: Performed;Min guard Statistician Method: Sit to Barista: Materials engineer and Hygiene: Performed;Minimal assistance Where Assessed - Engineer, mining and Hygiene: Standing Transfers/Ambulation Related to ADLs: Pt Min guard A for furniture walking from 3n1 to bed- refused RW    OT Diagnosis:    OT Problem List:   OT Treatment Interventions:     OT Goals ADL Goals ADL Goal: Statistician - Progress: Progressing toward goals ADL Goal: Toileting - Clothing  Manipulation - Progress: Progressing toward goals ADL Goal: Toileting - Hygiene - Progress: Progressing toward goals ADL Goal: Additional Goal #1 - Progress: Not progressing  Visit Information  Last OT Received On: 06/09/12 Assistance Needed: +1    Subjective Data      Prior Functioning       Cognition  Overall Cognitive Status: Impaired Arousal/Alertness: Awake/alert Orientation Level:  (refused to answer orientation questions) Behavior During Session: Agitated Cognition - Other Comments: Pt confused? When asked questions about home or when encouraged to perform or simulate functional activities during session, pt repeatedly saying, "You have to take care of yourself and I have to take care of me." "They     Mobility Bed Mobility Bed Mobility: Sit to Supine Sit to Supine: 5: Supervision;HOB flat Transfers Sit to Stand: 4: Min guard;From bed Stand to Sit: 4: Min guard;To chair/3-in-1;With armrests Details for Transfer Assistance: for safety. good hand placement used during session   Exercises    Balance    End of Session    GO     Disaya Walt 06/09/2012, 2:24 PM

## 2012-06-10 LAB — BASIC METABOLIC PANEL
Calcium: 8.8 mg/dL (ref 8.4–10.5)
GFR calc Af Amer: >90 mL/min (ref >90)
GFR calc non Af Amer: 83 mL/min — ABNORMAL LOW (ref >90)
Potassium: 3.4 mEq/L — ABNORMAL LOW (ref 3.5–5.1)
Sodium: 140 mEq/L (ref 135–145)

## 2012-06-10 LAB — GLUCOSE, CAPILLARY
Glucose-Capillary: 104 mg/dL — ABNORMAL HIGH (ref 70–99)
Glucose-Capillary: 108 mg/dL — ABNORMAL HIGH (ref 70–99)

## 2012-06-10 MED ORDER — LISINOPRIL 10 MG PO TABS
10.0000 mg | ORAL_TABLET | Freq: Every day | ORAL | Status: DC
  Administered 2012-06-10 – 2012-06-11 (×2): 10 mg via ORAL
  Filled 2012-06-10 (×2): qty 1

## 2012-06-10 MED ORDER — POTASSIUM CHLORIDE 20 MEQ/15ML (10%) PO LIQD
40.0000 meq | Freq: Once | ORAL | Status: DC
  Filled 2012-06-10: qty 30

## 2012-06-10 MED ORDER — POTASSIUM CHLORIDE 20 MEQ/15ML (10%) PO LIQD
20.0000 meq | Freq: Once | ORAL | Status: AC
  Administered 2012-06-10: 20 meq via ORAL
  Filled 2012-06-10: qty 15

## 2012-06-10 NOTE — Progress Notes (Addendum)
Patient ID: Megan [name], female   DOB: 1927-03-05, 78 y.o.   MRN: [id number] Subjective: Feeling well, wants to go home. Asks for food.  Hoping to be discharged soon. . No BM.   Complains that her right eye hurts from glaucoma, she relates is not new, she usually have aye pain.   Objective: Filed Vitals:   06/09/12 2016 06/09/12 2057 06/10/12 0410 06/10/12 0900  BP: 157/127 172/62 148/53 146/70  Pulse: 81 72 84 91  Temp: 98.9 F (37.2 C)  99 F (37.2 C) 98.6 F (37 C)  TempSrc: Oral  Oral Oral  Resp: 18  18 18   Height:      Weight: 63 kg (138 lb 14.2 oz)     SpO2: 93%  99% 99%   Weight change: -1.4 kg (-3 lb 1.4 oz)   General: Alert, awake, oriented x3, in no acute distress.  HEENT: No bruits, no goiter.  Heart: Regular rate and rhythm, without murmurs, rubs, gallops.  Lungs: CTA, bilateral air movement.  Abdomen: Soft, nontender, nondistended, positive bowel sounds, incision appears clean healing. Staples in tact Extremities; no edema.    Lab Results:  [facility] 06/10/12 0620 06/09/12 0614  NA 140 138  K 3.4* 3.5  CL 107 105  CO2 28 28  GLUCOSE 81 87  BUN <3* <3*  CREATININE 0.60 0.55  CALCIUM 8.8 8.6  MG -- --  PHOS -- --    Basename 06/08/12 0500  WBC 8.3  NEUTROABS --  HGB 9.7*  HCT 29.1*  MCV 84.1  PLT 159    Basename 06/08/12 1351  CKTOTAL 31  CKMB 1.5  CKMBINDEX --  TROPONINI <0.30   Medications: I have reviewed the patient's current medications.  1-SBO (small bowel obstruction): status post surgery on 06/03/12  - s/p an exploratory laparotomy, SBO secondary to adhesive band.  - Further management per [facility] surgery (now primary)  Per surgery will advance diet.  2-Abnormal EKG: Does not appear to be afib, patient has marked sinus arrhythmia with PVCs however T wave inversions are concerning which are new, no chest pain or shortness of breath. EKG changes probably related to electrolytes abnormalities.  -ECHO, Normal,  Left ventricle: The cavity  size was normal. Wall thickness was increased in a pattern of mild LVH. Systolic function was normal. The estimated ejection fraction was in the range of 55% to 60%. Aortic valve: Mild regurgitation. Mitral valve: Calcified annulus. Mild regurgitation. Atrial septum: No defect or patent foramen ovale was identified. Repeated EKG no T wave inversion, occasional PVC.  - Cardiac enzymes negative.  -Patient denies chest pain or dyspnea.  -NSR on telemetry 8/9  3-AKI (acute kidney injury): Resolved, likely prerenal   4-DM (diabetes mellitus), type 2 with complications:  - Continue CBGs q4 hours, continue sliding scale insulin   5-Hypertension: Will resume home medication. Will restart lisinopril as BP slightly up  6-Hypothyroidism:  -Change synthroid to oral.   7-Leukocytosis: resolved, possibly stress leukocytosis/demargination, afebrile   8-DVT Prophylaxis:Heparin subcutaneous   9-Hypokalemia:  Will give 40 liquid po today.  Frequent ambulation.   Disposition; per surgery.       LOS: 14 days   Conley Canal [phone] [facility] 06/10/2012, 10:46 AM  Agree with above, patient seen and examined. She relates chronic eye pain not worse. Wants to eat.  Frequent ambulation.

## 2012-06-10 NOTE — Progress Notes (Signed)
Patient ID: Megan [name], female   DOB: 1927-04-18, 78 y.o.   MRN: [id number] Patient ID: Megan [name], female   DOB: 22-Mar-1927, 78 y.o.   MRN: [id number] Patient ID: Megan [name], female   DOB: 21-Sep-1927, 78 y.o.   MRN: [id number] 7 Days Post-Op  Subjective: Pt without c/o.  Laying in bed and doesn't mobilize much.  Objective: Vital signs in last 24 hours: Temp:  [98.1 F (36.7 C)-99 F (37.2 C)] 99 F (37.2 C) (08/09 0410) Pulse Rate:  [65-84] 84  (08/09 0410) Resp:  [18] 18  (08/09 0410) BP: (134-172)/(53-127) 148/53 mmHg (08/09 0410) SpO2:  [93 %-100 %] 99 % (08/09 0410) Weight:  [138 lb 14.2 oz (63 kg)] 138 lb 14.2 oz (63 kg) (08/08 2016) Last BM Date: 06/03/12  Intake/Output from previous day: 08/08 0701 - 08/09 0700 In: 1866.7 [P.O.:120; I.V.:1746.7] Out: 525 [Urine:525] Intake/Output this shift:    General appearance: Abd: soft, non tender, minimal BS, non distended, no N/V, NG has been removed.  Wound looks good. Cardiac: irregular, denies any CP, syncope. Lungs: CTA bilaterally  Lab Results:   Basename 06/08/12 0500  WBC 8.3  HGB 9.7*  HCT 29.1*  PLT 159   BMET  Basename 06/10/12 0620 06/09/12 0614  NA 140 138  K 3.4* 3.5  CL 107 105  CO2 28 28  GLUCOSE 81 87  BUN <3* <3*  CREATININE 0.60 0.55  CALCIUM 8.8 8.6   PT/INR No results found for this basename: LABPROT:2,INR:2 in the last 72 hours CMP     Component Value Date/Time   NA 140 06/10/2012 0620   K 3.4* 06/10/2012 0620   CL 107 06/10/2012 0620   CO2 28 06/10/2012 0620   GLUCOSE 81 06/10/2012 0620   BUN <3* 06/10/2012 0620   CREATININE 0.60 06/10/2012 0620   CALCIUM 8.8 06/10/2012 0620   PROT 7.7 05/27/2012 1151   ALBUMIN 3.5 05/27/2012 1151   AST 15 05/27/2012 1151   ALT 6 05/27/2012 1151   ALKPHOS 54 05/27/2012 1151   BILITOT 1.0 05/27/2012 1151   GFRNONAA 83* 06/10/2012 0620   GFRAA >90 06/10/2012 0620   Lipase     Component Value Date/Time   LIPASE 12 05/27/2012 1151        Studies/Results: No results found.  Anti-infectives: Anti-infectives     Start     Dose/Rate Route Frequency Ordered Stop   06/03/12 1600   ertapenem (INVANZ) 1 g in sodium chloride 0.9 % 50 mL IVPB        1 g 100 mL/hr over 30 Minutes Intravenous  Once 06/03/12 1552 06/03/12 1743          Assessment/Plan  1. S/p ex lap with LOA   .Enterolysis - B. [name] - 8/2/013  2. New onset A. Fib (being managed by medicine team)  Patient Active Problem List  Diagnosis  . Peritonitis  . Intra-abdominal free air of unknown etiology  . AKI (acute kidney injury)  . Delirium  . DM (diabetes mellitus), type 2 with complications  . Hypertension  . Diastolic dysfunction, left ventricle by ECHO 2011  . Arthritis  . Hypothyroidism  . Acute respiratory failure  . Acute delirium  . Nausea with vomiting  . SBO (small bowel obstruction)  . Abnormal EKG   Plan: 1. Medical management per hospitalist.  Will advance to regular diet.  She does not like the liquids. 2. Will need to d/w family her dispo as it's recommended she has  either 24 assist at home or SNF.   (per patient she has daughters who are planning on staying with her, so we will need to confirm this plan prior to discharge.) 3. Social work consult to assist with #2. 4. PT and OT are in place, nursing encouraged to mobilize patient as well, when PT not availiable.  Patient is able to ambulate up to 160 feet per PT's notes.    LOS: 14 days  DUANNE, JEREMY 06/10/2012  As above.   Patient needs to be ambulated.  Still in bed, in the dark, at 10:30 AM. Will advance diet. Primary problem may be disposition, though she does have several daughters.  She plans to go home. If regular diet tolerated, home possibly Saturday or Sunday.  Ovidio Kin, MD, [facility] Surgery Pager: [phone] Office phone:  [phone]

## 2012-06-11 LAB — BASIC METABOLIC PANEL
BUN: 3 mg/dL — ABNORMAL LOW (ref 6–23)
CO2: 29 mEq/L (ref 19–32)
Calcium: 8.7 mg/dL (ref 8.4–10.5)
Chloride: 104 mEq/L (ref 96–112)
Creatinine, Ser: 0.64 mg/dL (ref 0.50–1.10)
Glucose, Bld: 92 mg/dL (ref 70–99)

## 2012-06-11 LAB — GLUCOSE, CAPILLARY: Glucose-Capillary: 91 mg/dL (ref 70–99)

## 2012-06-11 MED ORDER — POTASSIUM CHLORIDE CRYS ER 20 MEQ PO TBCR
40.0000 meq | EXTENDED_RELEASE_TABLET | Freq: Every day | ORAL | Status: DC
  Filled 2012-06-11: qty 2

## 2012-06-11 MED ORDER — POTASSIUM CHLORIDE CRYS ER 20 MEQ PO TBCR
40.0000 meq | EXTENDED_RELEASE_TABLET | Freq: Once | ORAL | Status: AC
  Administered 2012-06-11: 40 meq via ORAL

## 2012-06-11 MED ORDER — POTASSIUM CHLORIDE CRYS ER 20 MEQ PO TBCR: 40.0000 meq | EXTENDED_RELEASE_TABLET | Freq: Every day | ORAL | Status: DC

## 2012-06-11 MED ORDER — HYDROCODONE-ACETAMINOPHEN 5-325 MG PO TABS: 1.0000 | ORAL_TABLET | ORAL | Status: AC | PRN

## 2012-06-11 MED ORDER — INSULIN ASPART 100 UNIT/ML ~~LOC~~ SOLN: 0.0000 [IU] | Freq: Three times a day (TID) | SUBCUTANEOUS | Status: DC

## 2012-06-11 MED ORDER — POLYVINYL ALCOHOL 1.4 % OP SOLN: 1.0000 [drp] | OPHTHALMIC | Status: AC | PRN

## 2012-06-11 MED ORDER — POTASSIUM CHLORIDE CRYS ER 20 MEQ PO TBCR
40.0000 meq | EXTENDED_RELEASE_TABLET | Freq: Two times a day (BID) | ORAL | Status: DC
  Filled 2012-06-11: qty 2

## 2012-06-11 NOTE — Progress Notes (Signed)
Discussed discharge instructions and medications with patient. All questions answered. Sparks, Rebecca Annette RN  

## 2012-06-11 NOTE — Progress Notes (Signed)
8 Days Post-Op  Subjective: Feels good and anxioius to go home. Passed gas and small BM, no nausea, tol solids, but not eating a lot yet   Objective: Vital signs in last 24 hours: Temp:  [98.3 F (36.8 C)-99.6 F (37.6 C)] 98.3 F (36.8 C) (08/10 0506) Pulse Rate:  [86-106] 86  (08/10 0506) Resp:  [18] 18  (08/10 0506) BP: (133-162)/(70-81) 162/71 mmHg (08/10 0506) SpO2:  [93 %-100 %] 100 % (08/10 0506) Weight:  [140 lb 6.9 oz (63.7 kg)] 140 lb 6.9 oz (63.7 kg) (08/09 2033)   Intake/Output from previous day:   Intake/Output this shift:     General appearance: alert, cooperative and no distress Resp: clear to auscultation bilaterally GI: soft, non-tender; bowel sounds normal; no masses,  no organomegaly  Incision: healing well  Lab Results:  No results found for this basename: WBC:2,HGB:2,HCT:2,PLT:2 in the last 72 hours BMET  [facility] 06/11/12 0641 06/10/12 0620  NA 139 140  K 3.2* 3.4*  CL 104 107  CO2 29 28  GLUCOSE 92 81  BUN 3* <3*  CREATININE 0.64 0.60  CALCIUM 8.7 8.8   PT/INR No results found for this basename: LABPROT:2,INR:2 in the last 72 hours ABG No results found for this basename: PHART:2,PCO2:2,PO2:2,HCO3:2 in the last 72 hours  MEDS, Scheduled    . amLODipine  2.5 mg Oral Daily  . antiseptic oral rinse  15 mL Mouth Rinse q12n4p  . chlorhexidine  15 mL Mouth Rinse BID  . feeding supplement  1 Container Oral TID BM  . heparin  5,000 Units Subcutaneous Q8H  . insulin aspart  0-9 Units Subcutaneous TID AC & HS  . levothyroxine  50 mcg Oral Daily  . lisinopril  10 mg Oral Daily  . pantoprazole  40 mg Oral Daily  . potassium chloride  20 mEq Oral Once  . potassium chloride  40 mEq Oral BID  . Travoprost (BAK Free)  1 drop Both Eyes QHS  . DISCONTD: insulin aspart  0-9 Units Subcutaneous Q4H  . DISCONTD: potassium chloride  40 mEq Oral Once    Studies/Results: No results found.  Assessment: s/p Procedure(s): EXPLORATORY  LAPAROTOMY Patient Active Problem List  Diagnosis  . Peritonitis  . Intra-abdominal free air of unknown etiology  . AKI (acute kidney injury)  . Delirium  . DM (diabetes mellitus), type 2 with complications  . Hypertension  . Diastolic dysfunction, left ventricle by ECHO 2011  . Arthritis  . Hypothyroidism  . Acute respiratory failure  . Acute delirium  . Nausea with vomiting  . SBO (small bowel obstruction)  . Abnormal EKG    Improved and close to ready to go. Still hypokalemic however  Plan: Will give extra K+ today. Might be able to go later today, but need K+ addressed   LOS: 15 days     Currie Paris, MD, [facility] Surgery, [place] [phone]   06/11/2012 7:44 AM

## 2012-06-11 NOTE — Progress Notes (Signed)
Primary care OK with d/c so will let her go home today and see in office in 6-10 days

## 2012-06-11 NOTE — Progress Notes (Addendum)
Subjective: Feeling well, no eye pain. Wants to go home.  Had BM yesterday. Tolerate breakfast.   Objective: Filed Vitals:   06/10/12 1833 06/10/12 2033 06/11/12 0506 06/11/12 0932  BP: 156/81 160/70 162/71 159/74  Pulse: 106 88 86 100  Temp: 99.6 F (37.6 C) 99.6 F (37.6 C) 98.3 F (36.8 C) 98.7 F (37.1 C)  TempSrc: Oral Oral Oral Oral  Resp: 18 18 18 18   Height:      Weight:  63.7 kg (140 lb 6.9 oz)    SpO2: 93% 99% 100% 100%   Weight change: 0.7 kg (1 lb 8.7 oz)   General: Alert, awake, oriented x3, in no acute distress.  HEENT: No bruits, no goiter.  Heart: Regular rate and rhythm, without murmurs, rubs, gallops.  Lungs: CTA, bilateral air movement.  Abdomen: Soft, nontender, nondistended, positive bowel sounds.  Extremities; no edema.    Lab Results:  [facility] 06/11/12 0641 06/10/12 0620  NA 139 140  K 3.2* 3.4*  CL 104 107  CO2 29 28  GLUCOSE 92 81  BUN 3* <3*  CREATININE 0.64 0.60  CALCIUM 8.7 8.8  MG -- --  PHOS -- --    Basename 06/08/12 1351  CKTOTAL 31  CKMB 1.5  CKMBINDEX --  TROPONINI <0.30    Micro Results: No results found for this or any previous visit (from the past 240 hour(s)).  Studies/Results: No results found.  Medications: I have reviewed the patient's current medications.  1-SBO (small bowel obstruction): status post surgery on 06/03/12  - s/p an exploratory laparotomy, SBO secondary to adhesive band.  - Further management per [facility] surgery (now primary) Per surgery will advance diet.  2-Abnormal EKG: Does not appear to be afib, patient has marked sinus arrhythmia with PVCs however T wave inversions are concerning which are new, no chest pain or shortness of breath. EKG changes probably related to electrolytes abnormalities.  -ECHO, Normal, Left ventricle: The cavity size was normal. Wall thickness was increased in a pattern of mild LVH. Systolic function was normal. The estimated ejection fraction was in the range of 55% to 60%.  Aortic valve: Mild regurgitation. Mitral valve: Calcified annulus. Mild regurgitation. Atrial septum: No defect or patent foramen ovale was identified. Repeated EKG no T wave inversion, occasional PVC.  - Cardiac enzymes negative.  -Patient denies chest pain or dyspnea.  -NSR on telemetry 8/9  3-AKI (acute kidney injury): Resolved, likely prerenal  4-DM (diabetes mellitus), type 2 with complications:  - Continue CBGs q4 hours, continue sliding scale insulin  5-Hypertension: Will resume home medication. Will restart lisinopril as BP slightly up  6-Hypothyroidism:  -Change synthroid to oral.  7-Leukocytosis: resolved, possibly stress leukocytosis/demargination, afebrile   8-DVT Prophylaxis:Heparin subcutaneous   9-Hypokalemia: Will give 40 meq now then 40 meq at 1 pm. Discharge on 40 meq po daily for 5 days.   I will do part of her home medication reconciliation for her chronic medical problems.   Disposition; ok to discharge today from medicne.  Surgery is primary    LOS: 15 days   Geriann Lafont M.D.  [facility] 06/11/2012, 9:38 AM

## 2012-06-14 NOTE — Discharge Summary (Signed)
Physician Discharge Summary  Patient ID: Megan [name] MRN: [id number] DOB/AGE: 1927-10-19 78 y.o.  Admit date: 05/27/2012 Discharge date: 06/14/2012  Admission Diagnoses: abdominal pain and distention  Discharge Diagnoses:  Principal Problem:  *SBO (small bowel obstruction) Active Problems:  AKI (acute kidney injury)  DM (diabetes mellitus), type 2 with complications  Hypertension  Hypothyroidism  Nausea with vomiting  Abnormal EKG   Discharged Condition: stable  Hospital Course: Patient is an 78 y/o with history of complaint of abdominal pain and distention. Who had a laparoscopic repair of perforated ulcer several months ago. She presented to [facility] complaining of weakness, nausea, and emesis. The patient's history was limited secondary to the patient being somnolent but arrousable with limited responses to questions. Additional history was obtained from her previous chart. Reportedly patient had decrease in oral intake and reported that prior to coming to the ED she had bouts of emesis. She denied any abdominal pain associated with this.  After a lengthy course of conservative treatment which included IVF, bowel rest, slow reintroduction of liquids and another return to NPO status and NG tube drainage; it was determined that an exploratory  Laparotomy was indicated as she was not making sufficent progress in resolution of her SBO. She underwent this procedure on 06/03/12 by Dr. Johna [name] and did well there after to the point that she was able to resume po intake, and had a satisfactory return of bowel function. She continued to make satisfactory progress and was eventually discharged to  Home with planned follow up with Dr. Johna [name] post discharge.   Consults: Neurology  Significant Diagnostic Studies: labs, microbiology and radiology  Treatments: IV hydration, antibiotics, analgesia, respiratory therapy and surgery.  Discharge Exam: Blood pressure 159/74, pulse 100, temperature 98.7  F (37.1 C), temperature source Oral, resp. rate 18, height 5\' 7"  (1.702 m), weight 140 lb 6.9 oz (63.7 kg), SpO2 100.00%. General appearance: alert, cooperative, appears stated age and no distress  Disposition: 01-Home or Self Care  Discharge Orders    Future Appointments: Provider: Department: Dept Phone: Center:   06/16/2012 12:10 PM Megan Saa, MD [facility] [phone] None     Future Orders Please Complete By Expires   Diet - low sodium heart healthy      Increase activity slowly      Discharge instructions      Comments:   [facility]      [facility], [place] [phone]  OPEN ABDOMINAL SURGERY: POST OP INSTRUCTIONS  Always review your discharge instruction sheet given to you by the facility where your surgery was performed.  IF YOU HAVE DISABILITY OR FAMILY LEAVE FORMS, YOU MUST BRING THEM TO THE OFFICE FOR PROCESSING.  PLEASE DO NOT GIVE THEM TO YOUR DOCTOR.  A prescription for pain medication may be given to you upon discharge.  Take your pain medication as prescribed, if needed.  If narcotic pain medicine is not needed, then you may take acetaminophen (Tylenol) or ibuprofen (Advil) as needed. Take your usually prescribed medications unless otherwise directed. If you need a refill on your pain medication, please contact your pharmacy. They will contact our office to request authorization.  Prescriptions will not be filled after 5pm or on week-ends. You should follow a light diet the first few days after arrival home, such as soup and crackers, pudding, etc.unless your doctor has advised otherwise. A high-fiber, low fat diet can be resumed as tolerated.   Be sure to include lots of fluids daily. Most patients will experience  some swelling and bruising on the chest and neck area.  Ice packs will help.  Swelling and bruising can take several days to resolve Most patients will experience some swelling and bruising in the area of the incision. Ice pack will help.  Swelling and bruising can take several days to resolve..  It is common to experience some constipation if taking pain medication after surgery.  Increasing fluid intake and taking a stool softener will usually help or prevent this problem from occurring.  A mild laxative (Milk of Magnesia or Miralax) should be taken according to package directions if there are no bowel movements after 48 hours.  You may have steri-strips (small skin tapes) in place directly over the incision.  These strips should be left on the skin for 7-10 days.  If your surgeon used skin glue on the incision, you may shower in 24 hours.  The glue will flake off over the next 2-3 weeks.  Any sutures or staples will be removed at the office during your follow-up visit. You may find that a light gauze bandage over your incision may keep your staples from being rubbed or pulled. You may shower and replace the bandage daily. ACTIVITIES:  You may resume regular (light) daily activities beginning the next day-such as daily self-care, walking, climbing stairs-gradually increasing activities as tolerated.  You may have sexual intercourse when it is comfortable.  Refrain from any heavy lifting or straining until approved by your doctor. You may drive when you no longer are taking prescription pain medication, you can comfortably wear a seatbelt, and you can safely maneuver your car and apply brakes Return to Work: ___________________________________ Megan [name] should see your doctor in the office for a follow-up appointment approximately two weeks after your surgery.  Make sure that you call for this appointment within a day or two after you arrive home to insure a convenient appointment time. OTHER INSTRUCTIONS:  _____________________________________________________________ _____________________________________________________________  WHEN TO CALL YOUR DOCTOR: Fever over 101.0 Inability to urinate Nausea and/or vomiting Extreme swelling or  bruising Continued bleeding from incision. Increased pain, redness, or drainage from the incision. Difficulty swallowing or breathing Muscle cramping or spasms. Numbness or tingling in hands or feet or around lips.  The clinic staff is available to answer your questions during regular business hours.  Please don't hesitate to call and ask to speak to one of the nurses if you have concerns.  For further questions, please visit [email or web address]     Medication List  As of 06/14/2012  9:51 AM   TAKE these medications         amLODipine 5 MG tablet   Commonly known as: NORVASC   Take 2.5 mg by mouth daily.      bisoprolol-hydrochlorothiazide 5-6.25 MG per tablet   Commonly known as: ZIAC   Take 1 tablet by mouth daily.      clorazepate 7.5 MG tablet   Commonly known as: TRANXENE   Take 7.5 mg by mouth at bedtime.      donepezil 10 MG tablet   Commonly known as: ARICEPT   Take 5 mg by mouth daily.      HYDROcodone-acetaminophen 5-325 MG per tablet   Commonly known as: NORCO/VICODIN   Take 1 tablet by mouth every 4 (four) hours as needed for pain.      levothyroxine 50 MCG tablet   Commonly known as: SYNTHROID, LEVOTHROID   Take 50 mcg by mouth daily.      lisinopril 20 MG tablet  Commonly known as: PRINIVIL,ZESTRIL   Take 20 mg by mouth daily.      pantoprazole 40 MG tablet   Commonly known as: PROTONIX   Take 40 mg by mouth daily.      polyvinyl alcohol 1.4 % ophthalmic solution   Commonly known as: LIQUIFILM TEARS   Place 1 drop into both eyes as needed.      potassium chloride SA 20 MEQ tablet   Commonly known as: K-DUR,KLOR-CON   Take 2 tablets (40 mEq total) by mouth daily.      SEROQUEL XR 50 MG Tb24   Generic drug: QUEtiapine   Take 50 mg by mouth daily.      Travoprost (BAK Free) 0.004 % Soln ophthalmic solution   Commonly known as: TRAVATAN   Place 1 drop into both eyes at bedtime.           Follow-up Information    Follow up with  [name],BENJAMIN T, MD in 1 week.   Contact information:   [address] [place] [address] [phone]       Schedule an appointment as soon as possible for a visit with [name], MD.   Contact information:   [address] [place] [address] [phone]          Signed: Blenda Mounts 06/14/2012, 9:51 AM

## 2012-06-16 ENCOUNTER — Encounter (INDEPENDENT_AMBULATORY_CARE_PROVIDER_SITE_OTHER): Payer: Self-pay | Admitting: General Surgery

## 2012-06-16 ENCOUNTER — Ambulatory Visit (INDEPENDENT_AMBULATORY_CARE_PROVIDER_SITE_OTHER): Payer: Medicare Other | Admitting: General Surgery

## 2012-06-16 VITALS — BP 130/82 | HR 66 | Temp 99.2°F | Resp 24 | Wt 133.0 lb

## 2012-06-16 DIAGNOSIS — Z9889 Other specified postprocedural states: Secondary | ICD-10-CM

## 2012-06-16 NOTE — Progress Notes (Signed)
History: Patient returns approximately 2 weeks following laparotomy and lysis of adhesions for small bowel obstruction. She is back at home. She reports she is doing pretty well. Her daughter agrees. She is eating okay but not much appetite. No significant pain or vomiting.  Exam: BP 130/82  Pulse 66  Temp 99.2 F (37.3 C) (Oral)  Resp 24  Wt 133 lb (60.328 kg) General: Elderly female in no distress Abdomen: Soft and nontender. Her incision is well-healed without complications and her staples were removed.  Assessment and plan: Doing well following laparotomy lysis of adhesions for small bowel obstruction without complication identified. I will see her back in 4 weeks for what should be a final check.

## 2012-06-25 ENCOUNTER — Other Ambulatory Visit: Payer: Self-pay | Admitting: Internal Medicine

## 2012-07-08 ENCOUNTER — Ambulatory Visit (INDEPENDENT_AMBULATORY_CARE_PROVIDER_SITE_OTHER): Payer: Medicare Other | Admitting: General Surgery

## 2012-07-08 VITALS — BP 114/70 | HR 64 | Temp 97.6°F | Resp 16 | Ht 66.0 in | Wt 125.6 lb

## 2012-07-08 DIAGNOSIS — Z09 Encounter for follow-up examination after completed treatment for conditions other than malignant neoplasm: Secondary | ICD-10-CM

## 2012-07-08 MED ORDER — NYSTATIN 100000 UNIT/GM EX POWD: CUTANEOUS | Status: DC

## 2012-07-08 NOTE — Progress Notes (Signed)
History: Patient returns to the office now 6 weeks following laparotomy lysis of adhesions for small bowel obstruction. She does not have any specific complaints but her appetite is still not what it was it she still feels somewhat weak. This however has continued to improve since surgery. No vomiting or severe abdominal pain.  Exam: BP 114/70  Pulse 64  Temp 97.6 F (36.4 C) (Temporal)  Resp 16  Ht 5\' 6"  (1.676 m)  Wt 125 lb 9.6 oz (56.972 kg)  BMI 20.27 kg/m2 General: A thin elderly African American female in no distress Abdomen: Generally soft and nontender. There is a tiny 2-3 mm area of raised granulation tissue just beneath the umbilicus at her incision which I cauterized. There is a little bit of monilial type infection where her skin folds over.  Generally the wound is well healed.  Assessment and plan: Doing well following lysis of adhesions for bowel obstruction with no evidence of complication or recurrent obstruction. We will try some Mycostatin powder on her monilial rash. I think she is making progress. I will see her back as needed for concerns or if her appetite and strength are not steadily improving over the next several weeks.

## 2012-07-08 NOTE — Patient Instructions (Signed)
Call as needed for problems or if appetite and strength have not improved in the next 6 weeks.

## 2012-07-18 ENCOUNTER — Encounter (INDEPENDENT_AMBULATORY_CARE_PROVIDER_SITE_OTHER): Payer: Self-pay

## 2012-07-27 ENCOUNTER — Encounter (INDEPENDENT_AMBULATORY_CARE_PROVIDER_SITE_OTHER): Payer: Self-pay

## 2013-02-27 ENCOUNTER — Inpatient Hospital Stay (HOSPITAL_COMMUNITY)
Admission: EM | Admit: 2013-02-27 | Discharge: 2013-03-02 | DRG: 558 | Disposition: A | Discharge status: Skilled Nursing Facility | Payer: Medicare Other | Attending: Internal Medicine | Admitting: Internal Medicine

## 2013-02-27 ENCOUNTER — Emergency Department (HOSPITAL_COMMUNITY): Payer: Medicare Other

## 2013-02-27 ENCOUNTER — Encounter (HOSPITAL_COMMUNITY): Payer: Self-pay

## 2013-02-27 ENCOUNTER — Inpatient Hospital Stay (HOSPITAL_COMMUNITY): Payer: Medicare Other

## 2013-02-27 DIAGNOSIS — D696 Thrombocytopenia, unspecified: Secondary | ICD-10-CM | POA: Diagnosis present

## 2013-02-27 DIAGNOSIS — R410 Disorientation, unspecified: Secondary | ICD-10-CM

## 2013-02-27 DIAGNOSIS — R7989 Other specified abnormal findings of blood chemistry: Secondary | ICD-10-CM

## 2013-02-27 DIAGNOSIS — I10 Essential (primary) hypertension: Secondary | ICD-10-CM | POA: Diagnosis present

## 2013-02-27 DIAGNOSIS — D649 Anemia, unspecified: Secondary | ICD-10-CM | POA: Diagnosis present

## 2013-02-27 DIAGNOSIS — N179 Acute kidney failure, unspecified: Secondary | ICD-10-CM

## 2013-02-27 DIAGNOSIS — E876 Hypokalemia: Secondary | ICD-10-CM | POA: Diagnosis present

## 2013-02-27 DIAGNOSIS — E860 Dehydration: Secondary | ICD-10-CM | POA: Diagnosis present

## 2013-02-27 DIAGNOSIS — R55 Syncope and collapse: Secondary | ICD-10-CM

## 2013-02-27 DIAGNOSIS — I519 Heart disease, unspecified: Secondary | ICD-10-CM | POA: Diagnosis present

## 2013-02-27 DIAGNOSIS — E118 Type 2 diabetes mellitus with unspecified complications: Secondary | ICD-10-CM | POA: Diagnosis present

## 2013-02-27 DIAGNOSIS — D72829 Elevated white blood cell count, unspecified: Secondary | ICD-10-CM | POA: Diagnosis present

## 2013-02-27 DIAGNOSIS — R404 Transient alteration of awareness: Secondary | ICD-10-CM

## 2013-02-27 DIAGNOSIS — M6282 Rhabdomyolysis: Principal | ICD-10-CM | POA: Diagnosis present

## 2013-02-27 DIAGNOSIS — M129 Arthropathy, unspecified: Secondary | ICD-10-CM | POA: Diagnosis present

## 2013-02-27 DIAGNOSIS — S0003XA Contusion of scalp, initial encounter: Secondary | ICD-10-CM | POA: Diagnosis present

## 2013-02-27 DIAGNOSIS — E872 Acidosis, unspecified: Secondary | ICD-10-CM | POA: Diagnosis present

## 2013-02-27 DIAGNOSIS — E7800 Pure hypercholesterolemia, unspecified: Secondary | ICD-10-CM | POA: Diagnosis present

## 2013-02-27 DIAGNOSIS — E039 Hypothyroidism, unspecified: Secondary | ICD-10-CM | POA: Diagnosis present

## 2013-02-27 DIAGNOSIS — W010XXA Fall on same level from slipping, tripping and stumbling without subsequent striking against object, initial encounter: Secondary | ICD-10-CM | POA: Diagnosis present

## 2013-02-27 DIAGNOSIS — E1129 Type 2 diabetes mellitus with other diabetic kidney complication: Secondary | ICD-10-CM | POA: Diagnosis present

## 2013-02-27 DIAGNOSIS — F05 Delirium due to known physiological condition: Secondary | ICD-10-CM | POA: Diagnosis present

## 2013-02-27 DIAGNOSIS — F0150 Vascular dementia without behavioral disturbance: Secondary | ICD-10-CM | POA: Diagnosis present

## 2013-02-27 DIAGNOSIS — Y92009 Unspecified place in unspecified non-institutional (private) residence as the place of occurrence of the external cause: Secondary | ICD-10-CM

## 2013-02-27 DIAGNOSIS — F0390 Unspecified dementia without behavioral disturbance: Secondary | ICD-10-CM | POA: Diagnosis present

## 2013-02-27 DIAGNOSIS — J9600 Acute respiratory failure, unspecified whether with hypoxia or hypercapnia: Secondary | ICD-10-CM

## 2013-02-27 LAB — COMPREHENSIVE METABOLIC PANEL
BUN: 10 mg/dL (ref 6–23)
CO2: 24 mEq/L (ref 19–32)
Chloride: 96 mEq/L (ref 96–112)
Creatinine, Ser: 0.48 mg/dL — ABNORMAL LOW (ref 0.50–1.10)
GFR calc Af Amer: >90 mL/min (ref >90)
GFR calc non Af Amer: 88 mL/min — ABNORMAL LOW (ref >90)
Glucose, Bld: 148 mg/dL — ABNORMAL HIGH (ref 70–99)
Total Bilirubin: 0.9 mg/dL (ref 0.3–1.2)

## 2013-02-27 LAB — URINE MICROSCOPIC-ADD ON

## 2013-02-27 LAB — POCT I-STAT TROPONIN I

## 2013-02-27 LAB — CK: Total CK: 795 U/L — ABNORMAL HIGH (ref 7–177)

## 2013-02-27 LAB — GLUCOSE, CAPILLARY: Glucose-Capillary: 122 mg/dL — ABNORMAL HIGH (ref 70–99)

## 2013-02-27 LAB — URINALYSIS, ROUTINE W REFLEX MICROSCOPIC
Bilirubin Urine: NEGATIVE
Glucose, UA: >1000 mg/dL — AB
Nitrite: NEGATIVE
Urobilinogen, UA: 1 mg/dL (ref 0.0–1.0)
pH: 6.5 (ref 5.0–8.0)

## 2013-02-27 LAB — CBC WITH DIFFERENTIAL/PLATELET
HCT: 47.3 % — ABNORMAL HIGH (ref 36.0–46.0)
Hemoglobin: 16.4 g/dL — ABNORMAL HIGH (ref 12.0–15.0)
Lymphocytes Relative: 11 % — ABNORMAL LOW (ref 12–46)
MCV: 85.7 fL (ref 78.0–100.0)
Monocytes Absolute: 0.6 10*3/uL (ref 0.1–1.0)
Monocytes Relative: 6 % (ref 3–12)
Neutro Abs: 8.9 10*3/uL — ABNORMAL HIGH (ref 1.7–7.7)
WBC: 10.6 10*3/uL — ABNORMAL HIGH (ref 4.0–10.5)

## 2013-02-27 LAB — POCT I-STAT, CHEM 8
Creatinine, Ser: 0.6 mg/dL (ref 0.50–1.10)
Glucose, Bld: 143 mg/dL — ABNORMAL HIGH (ref 70–99)
Hemoglobin: 15.3 g/dL — ABNORMAL HIGH (ref 12.0–15.0)
TCO2: 32 mmol/L (ref 0–100)

## 2013-02-27 LAB — CG4 I-STAT (LACTIC ACID): Lactic Acid, Venous: 3.7 mmol/L — ABNORMAL HIGH (ref 0.5–2.2)

## 2013-02-27 LAB — MRSA PCR SCREENING: MRSA by PCR: NEGATIVE

## 2013-02-27 MED ORDER — ONDANSETRON HCL 4 MG/2ML IJ SOLN: 4.0000 mg | Freq: Four times a day (QID) | INTRAMUSCULAR | Status: DC | PRN

## 2013-02-27 MED ORDER — ALUM & MAG HYDROXIDE-SIMETH 200-200-20 MG/5ML PO SUSP
30.0000 mL | Freq: Four times a day (QID) | ORAL | Status: DC | PRN
  Filled 2013-02-27: qty 30

## 2013-02-27 MED ORDER — SODIUM CHLORIDE 0.9 % IV SOLN: INTRAVENOUS | Status: AC

## 2013-02-27 MED ORDER — SODIUM CHLORIDE 0.9 % IV SOLN
Freq: Once | INTRAVENOUS | Status: AC
  Administered 2013-02-27: 14:00:00 via INTRAVENOUS

## 2013-02-27 MED ORDER — AMLODIPINE BESYLATE 5 MG PO TABS
5.0000 mg | ORAL_TABLET | Freq: Once | ORAL | Status: AC
  Administered 2013-02-27: 5 mg via ORAL
  Filled 2013-02-27: qty 1

## 2013-02-27 MED ORDER — ACETAMINOPHEN 325 MG PO TABS: 650.0000 mg | ORAL_TABLET | Freq: Four times a day (QID) | ORAL | Status: DC | PRN

## 2013-02-27 MED ORDER — PANTOPRAZOLE SODIUM 40 MG PO TBEC
40.0000 mg | DELAYED_RELEASE_TABLET | Freq: Every day | ORAL | Status: DC
  Administered 2013-02-28: 40 mg via ORAL
  Filled 2013-02-27: qty 1

## 2013-02-27 MED ORDER — HEPARIN SODIUM (PORCINE) 5000 UNIT/ML IJ SOLN
5000.0000 [IU] | Freq: Three times a day (TID) | INTRAMUSCULAR | Status: DC
  Administered 2013-02-28 (×2): 5000 [IU] via SUBCUTANEOUS
  Filled 2013-02-27 (×11): qty 1

## 2013-02-27 MED ORDER — AMLODIPINE BESYLATE 10 MG PO TABS
10.0000 mg | ORAL_TABLET | Freq: Every day | ORAL | Status: DC
  Administered 2013-02-27 – 2013-02-28 (×2): 10 mg via ORAL
  Filled 2013-02-27 (×4): qty 1

## 2013-02-27 MED ORDER — ONDANSETRON HCL 4 MG/2ML IJ SOLN: 4.0000 mg | Freq: Three times a day (TID) | INTRAMUSCULAR | Status: AC | PRN

## 2013-02-27 MED ORDER — ONDANSETRON HCL 4 MG PO TABS: 4.0000 mg | ORAL_TABLET | Freq: Four times a day (QID) | ORAL | Status: DC | PRN

## 2013-02-27 MED ORDER — TRAVOPROST (BAK FREE) 0.004 % OP SOLN
1.0000 [drp] | Freq: Every day | OPHTHALMIC | Status: DC
  Administered 2013-02-27 – 2013-03-01 (×3): 1 [drp] via OPHTHALMIC
  Filled 2013-02-27: qty 2.5

## 2013-02-27 MED ORDER — SODIUM CHLORIDE 0.9 % IJ SOLN
3.0000 mL | Freq: Two times a day (BID) | INTRAMUSCULAR | Status: DC
  Administered 2013-03-01 (×2): 3 mL via INTRAVENOUS

## 2013-02-27 MED ORDER — ACETAMINOPHEN 650 MG RE SUPP: 650.0000 mg | Freq: Four times a day (QID) | RECTAL | Status: DC | PRN

## 2013-02-27 MED ORDER — POTASSIUM CHLORIDE CRYS ER 20 MEQ PO TBCR
40.0000 meq | EXTENDED_RELEASE_TABLET | Freq: Once | ORAL | Status: AC
  Administered 2013-02-27: 40 meq via ORAL
  Filled 2013-02-27: qty 2

## 2013-02-27 MED ORDER — LISINOPRIL 20 MG PO TABS
20.0000 mg | ORAL_TABLET | Freq: Once | ORAL | Status: AC
  Administered 2013-02-27: 20 mg via ORAL
  Filled 2013-02-27: qty 1

## 2013-02-27 MED ORDER — HYDRALAZINE HCL 20 MG/ML IJ SOLN
10.0000 mg | INTRAMUSCULAR | Status: DC | PRN
  Administered 2013-02-27 – 2013-02-28 (×2): 10 mg via INTRAVENOUS
  Filled 2013-02-27 (×2): qty 1

## 2013-02-27 MED ORDER — BISOPROLOL FUMARATE 5 MG PO TABS
5.0000 mg | ORAL_TABLET | Freq: Every day | ORAL | Status: DC
  Administered 2013-02-28 – 2013-03-02 (×3): 5 mg via ORAL
  Filled 2013-02-27 (×3): qty 1

## 2013-02-27 MED ORDER — DEXTROSE 5 % IV SOLN
1.0000 g | INTRAVENOUS | Status: DC
  Administered 2013-02-27: 1 g via INTRAVENOUS
  Filled 2013-02-27 (×2): qty 10

## 2013-02-27 MED ORDER — LEVOTHYROXINE SODIUM 50 MCG PO TABS
50.0000 ug | ORAL_TABLET | Freq: Every day | ORAL | Status: DC
  Administered 2013-02-28 – 2013-03-02 (×2): 50 ug via ORAL
  Filled 2013-02-27 (×4): qty 1

## 2013-02-27 NOTE — ED Notes (Signed)
In@out  cath no urine return.pt. dry

## 2013-02-27 NOTE — H&P (Signed)
[facility] History and Physical  Megan [name] ZOX:[id number] DOB: 11-23-1926 DOA: 02/27/2013  Referring physician: ED PCP: [name], MD  Specialists: none  Chief Complaint:  Chief Complaint  Patient presents with  . Weakness     HPI: Megan [name] is a 79 y.o. female with dementia, history of SBO, s/p laparotomy, who lives at home with husband was found down at home this morning by the caregiver who arrived to look after the patient. The patient has no recollection of any of these events. There is no family in the room. The patient husband has not been able to answer the phone. The patient's son has not answer the phone. All that the caregiver knows is that the patient was found face down in the bathroom wearing the same clothes she  was on the night before and that she probably was in the bathroom on the floor all night.    Review of Systems: Unobtainable due to patient's dementia  Past Medical History  Diagnosis Date  . Hypertension   . Diastolic dysfunction, left ventricle by ECHO 2011 03/25/2012  . Hypothyroidism 03/25/2012  . High cholesterol   . Type II diabetes mellitus   . H/O hiatal hernia   . Duodenal perforation 03/23/12  . Arthritis     "in my back"   Past Surgical History  Procedure Laterality Date  . Bladder tacked    . Repair perforated ulcer  03/23/12  . Diagnostic laparoscopy  03/25/12    REPAIR OF PERFORATED ULCER with omental patch  . Abcess drainage  03/25/12     of abdominal & pelvic abscesses  . Laparoscopy  03/24/2012    Procedure: LAPAROSCOPY DIAGNOSTIC;  Surgeon: Ardeth Sportsman, MD;  Location: [facility] OR;  Service: General;  Laterality: N/A;  drainage of abdominal and pelvic abcesses  . Laparotomy  06/03/2012    Procedure: EXPLORATORY LAPAROTOMY;  Surgeon: Mariella Saa, MD;  Location: [facility] OR;  Service: General;  Laterality: N/A;   Social History:  reports that she has never smoked. Her smokeless tobacco use includes Snuff. She  reports that she does not drink alcohol or use illicit drugs. Patient is at home with her husband  No Known Allergies  Family history is unobtainable  Prior to Admission medications   Medication Sig Start Date End Date Taking? Authorizing Provider  amLODipine (NORVASC) 5 MG tablet Take 2.5 mg by mouth daily.    Historical Provider, MD  bisoprolol-hydrochlorothiazide [facility]) 5-6.25 MG per tablet Take 1 tablet by mouth daily.    Historical Provider, MD  clorazepate (TRANXENE) 7.5 MG tablet Take 7.5 mg by mouth at bedtime.    Historical Provider, MD  donepezil (ARICEPT) 10 MG tablet Take 5 mg by mouth daily.    Historical Provider, MD  fluconazole (DIFLUCAN) 200 MG tablet  04/01/12   Historical Provider, MD  glimepiride (AMARYL) 4 MG tablet  04/22/12   Historical Provider, MD  levofloxacin (LEVAQUIN) 750 MG tablet  04/01/12   Historical Provider, MD  levothyroxine (SYNTHROID, LEVOTHROID) 50 MCG tablet Take 50 mcg by mouth daily.    Historical Provider, MD  lisinopril (PRINIVIL,ZESTRIL) 20 MG tablet Take 20 mg by mouth daily.    Historical Provider, MD  nystatin (MYCOSTATIN) powder Apply to affected area 3 times daily 07/08/12 07/08/13  Mariella Saa, MD  pantoprazole (PROTONIX) 40 MG tablet Take 40 mg by mouth daily.    Kathlen Mody, MD  potassium chloride SA (K-DUR,KLOR-CON) 20 MEQ tablet Take 2 tablets (40  mEq total) by mouth daily. 06/11/12 06/11/13  Belkys A Regalado, MD  QUEtiapine (SEROQUEL XR) 50 MG TB24 Take 50 mg by mouth daily.    Historical Provider, MD  Travoprost, BAK Free, (TRAVATAN) 0.004 % SOLN ophthalmic solution Place 1 drop into both eyes at bedtime.    Historical Provider, MD   Physical Exam: Filed Vitals:   02/27/13 1345 02/27/13 1421 02/27/13 1530 02/27/13 1545  BP: 223/79 213/70 212/69 210/68  Pulse: 66  67 77  Temp:      TempSrc:      Resp: 24  20 19   SpO2: 99%  99% 99%     General:  Alert, confused, able to follow one-step commands.  Eyes: Pupils are 3 mm,  symmetric and reactive to light  ENT: Dry oral mucosa  Neck: No JVD  Cardiovascular: Regular, no murmurs  Respiratory: Clear to auscultation bilaterally  Abdomen: Diminished sounds, seems to her some guarding but no severe tenderness  Skin: Dry, ecchymosis over the right temporal area  Musculoskeletal: Severe bitemporal muscle atrophy  Psychiatric: Unable to test  Neurologic: Seems to be able to move all 4 extremities  Labs on Admission:  Basic Metabolic Panel: No results found for this basename: NA, K, CL, CO2, GLUCOSE, BUN, CREATININE, CALCIUM, MG, PHOS,  in the last 168 hours Liver Function Tests: No results found for this basename: AST, ALT, ALKPHOS, BILITOT, PROT, ALBUMIN,  in the last 168 hours No results found for this basename: LIPASE, AMYLASE,  in the last 168 hours No results found for this basename: AMMONIA,  in the last 168 hours CBC:  Recent Labs Lab 02/27/13 1244  WBC 10.6*  NEUTROABS 8.9*  HGB 16.4*  HCT 47.3*  MCV 85.7  PLT 136*   Cardiac Enzymes:  Recent Labs Lab 02/27/13 1243  CKTOTAL 795*    BNP (last 3 results) No results found for this basename: PROBNP,  in the last 8760 hours CBG: No results found for this basename: GLUCAP,  in the last 168 hours  Radiological Exams on Admission: Dg Chest 2 View  02/27/2013  *RADIOLOGY REPORT*  Clinical Data: Weakness  CHEST - 2 VIEW  Comparison: 06/04/2012  Findings: The cardiac silhouette is mildly enlarged.  No mediastinal or hilar masses.  The lungs are clear.  The bony thorax is demineralized but intact.  IMPRESSION: No acute cardiopulmonary disease   Original Report Authenticated By: Amie Portland, M.D.    Ct Head Wo Contrast  02/27/2013  *RADIOLOGY REPORT*  Clinical Data:  79 year old female with severe headache.  Weakness. Found down.  CT HEAD WITHOUT CONTRAST CT CERVICAL SPINE WITHOUT CONTRAST  Technique:  Multidetector CT imaging of the head and cervical spine was performed following the standard  protocol without intravenous contrast.  Multiplanar CT image reconstructions of the cervical spine were also generated.  Comparison:  Brain MRI 04/27/2012 and earlier.  Cervical spine radiographs 01/08/2010.  CT HEAD  Findings: No scalp hematoma identified.  No acute orbit soft tissue findings.  Minimal ethmoid sinus mucosal thickening.  Other Visualized paranasal sinuses and mastoids are clear.  Hyperostosis frontalis.  Osteopenia.  Calvarium intact.  Dural calcifications. Calcified atherosclerosis at the skull base.  Patchy and confluent cerebral white matter hypodensity appears stable.  No ventriculomegaly. No midline shift, mass effect, or evidence of mass lesion.  No evidence of cortically based acute infarction identified.  No acute intracranial hemorrhage identified.  IMPRESSION: 1.  Stable. No acute intracranial abnormality.  No acute traumatic injury identified. 2.  Cervical  spine findings are below.  CT CERVICAL SPINE  Findings: Visualized skull base is intact.  No atlanto-occipital dissociation.  2 mm of retrolisthesis of C3 on C4 and similar anterolisthesis of C4 on C5.  Trace anterolisthesis of C7 on T1. Bilateral posterior element alignment is within normal limits. Multilevel cervical disc and facet degeneration.  Disc space loss and endplate spurring maximal at C3-C4.  No acute cervical fracture identified.  Calcified carotid atherosclerosis in the neck.  Negative visualized lung parenchyma.  Retained secretions in the thoracic esophagus.  Visualized upper thoracic levels remarkable for mild T4 compression fracture, favor chronic.  IMPRESSION: 1. No acute fracture or listhesis identified in the cervical spine. Ligamentous injury is not excluded.  Multilevel degenerative changes.  2.  Mild T4 compression fracture, appears to be chronic.   Original Report Authenticated By: Erskine Speed, M.D.    Ct Cervical Spine Wo Contrast  02/27/2013  *RADIOLOGY REPORT*  Clinical Data:  79 year old female with  severe headache.  Weakness. Found down.  CT HEAD WITHOUT CONTRAST CT CERVICAL SPINE WITHOUT CONTRAST  Technique:  Multidetector CT imaging of the head and cervical spine was performed following the standard protocol without intravenous contrast.  Multiplanar CT image reconstructions of the cervical spine were also generated.  Comparison:  Brain MRI 04/27/2012 and earlier.  Cervical spine radiographs 01/08/2010.  CT HEAD  Findings: No scalp hematoma identified.  No acute orbit soft tissue findings.  Minimal ethmoid sinus mucosal thickening.  Other Visualized paranasal sinuses and mastoids are clear.  Hyperostosis frontalis.  Osteopenia.  Calvarium intact.  Dural calcifications. Calcified atherosclerosis at the skull base.  Patchy and confluent cerebral white matter hypodensity appears stable.  No ventriculomegaly. No midline shift, mass effect, or evidence of mass lesion.  No evidence of cortically based acute infarction identified.  No acute intracranial hemorrhage identified.  IMPRESSION: 1.  Stable. No acute intracranial abnormality.  No acute traumatic injury identified. 2.  Cervical spine findings are below.  CT CERVICAL SPINE  Findings: Visualized skull base is intact.  No atlanto-occipital dissociation.  2 mm of retrolisthesis of C3 on C4 and similar anterolisthesis of C4 on C5.  Trace anterolisthesis of C7 on T1. Bilateral posterior element alignment is within normal limits. Multilevel cervical disc and facet degeneration.  Disc space loss and endplate spurring maximal at C3-C4.  No acute cervical fracture identified.  Calcified carotid atherosclerosis in the neck.  Negative visualized lung parenchyma.  Retained secretions in the thoracic esophagus.  Visualized upper thoracic levels remarkable for mild T4 compression fracture, favor chronic.  IMPRESSION: 1. No acute fracture or listhesis identified in the cervical spine. Ligamentous injury is not excluded.  Multilevel degenerative changes.  2.  Mild T4  compression fracture, appears to be chronic.   Original Report Authenticated By: Erskine Speed, M.D.     EKG: Independently reviewed.  Normal sinus rhythm, flat T waves  Assessment/Plan Principal Problem:   Syncope Active Problems:   DM (diabetes mellitus), type 2 with complications   Hypertension   Hypothyroidism   Acute delirium   Hypokalemia   Dementia   Rhabdomyolysis   1. Altered mental status-most likely delirium. Alternatively this could be toxic metabolic encephalopathy. Patient has baseline dementia and without the family member at the bedside is unclear to me how far is she from her normal state. Plan to monitor closely. Discontinue any mind altering medications. We will also monitor for seizures and obtain an EEG.  2. Rhabdomyolysis, From being on the floor through  the night , dehydration - We will start IV fluids and monitor renal function tomorrow.  3. probable syncope although there is no history to corroborate. EKG is nonacute, cardiac enzymes were within normal limits, echocardiogram from last year is within normal limits. Will keep on telemetry 4. hypertension-for now we will use when necessary hydralazine iv . We'll resume bisoprolol, Norvasc and lisinopril. We'll monitor closely response 5. Presumed urinary tract infection-sent cultures, start empiric Rocephin  Code Status: Full code  Family Communication: No one at bedside  Disposition Plan: Unclear Lonia Blood [facility] Pager [phone]  If 7PM-7AM, please contact night-coverage [email or web address] Password [facility] 02/27/2013, 4:15 PM

## 2013-02-27 NOTE — ED Notes (Signed)
Phlebotomy made aware that patient needs labs drawn.

## 2013-02-27 NOTE — ED Provider Notes (Signed)
History     CSN: [id number]  Arrival date & time 02/27/13  1008   First MD Initiated Contact with Patient 02/27/13 1049      Chief Complaint  Patient presents with  . Weakness    (Consider location/radiation/quality/duration/timing/severity/associated sxs/prior treatment) HPI  Megan [name] is a 79 y.o. female with past medical history significant for hypothyroid, type 2 diabetes, brought in by EMS for fall in bathroom last night. After that she was found with emesis besides her body, but was on May. Patient does not remember the fall. She was last checked on last night around 11 PM by her son. Her caretaker came over this morning and found her on the ground. Level V caveat secondary to dementia. According to her son, Megan [name], she is mentating at her baseline, he states that she comes in and out of dementia  Past Medical History  Diagnosis Date  . Hypertension   . Diastolic dysfunction, left ventricle by ECHO 2011 03/25/2012  . Hypothyroidism 03/25/2012  . High cholesterol   . Type II diabetes mellitus   . H/O hiatal hernia   . Duodenal perforation 03/23/12  . Arthritis     "in my back"    Past Surgical History  Procedure Laterality Date  . Bladder tacked    . Repair perforated ulcer  03/23/12  . Diagnostic laparoscopy  03/25/12    REPAIR OF PERFORATED ULCER with omental patch  . Abcess drainage  03/25/12     of abdominal & pelvic abscesses  . Laparoscopy  03/24/2012    Procedure: LAPAROSCOPY DIAGNOSTIC;  Surgeon: Ardeth Sportsman, MD;  Location: [facility] OR;  Service: General;  Laterality: N/A;  drainage of abdominal and pelvic abcesses  . Laparotomy  06/03/2012    Procedure: EXPLORATORY LAPAROTOMY;  Surgeon: Mariella Saa, MD;  Location: [facility] OR;  Service: General;  Laterality: N/A;    No family history on file.  History  Substance Use Topics  . Smoking status: Never Smoker   . Smokeless tobacco: Current User    Types: Snuff     Comment: 03/28/12 "use snuff q once in while;  not regular"  . Alcohol Use: No    OB History   Grav Para Term Preterm Abortions TAB SAB Ect Mult Living                  Review of Systems  Gastrointestinal: Positive for vomiting.  All other systems reviewed and are negative.    Allergies  Review of patient's allergies indicates no known allergies.  Home Medications   Current Outpatient Rx  Name  Route  Sig  Dispense  Refill  . amLODipine (NORVASC) 5 MG tablet   Oral   Take 2.5 mg by mouth daily.         . bisoprolol-hydrochlorothiazide (ZIAC) 5-6.25 MG per tablet   Oral   Take 1 tablet by mouth daily.         . clorazepate (TRANXENE) 7.5 MG tablet   Oral   Take 7.5 mg by mouth at bedtime.         . donepezil (ARICEPT) 10 MG tablet   Oral   Take 5 mg by mouth daily.         . fluconazole (DIFLUCAN) 200 MG tablet               . glimepiride (AMARYL) 4 MG tablet               . levofloxacin (LEVAQUIN)  750 MG tablet               . levothyroxine (SYNTHROID, LEVOTHROID) 50 MCG tablet   Oral   Take 50 mcg by mouth daily.         Marland Kitchen lisinopril (PRINIVIL,ZESTRIL) 20 MG tablet   Oral   Take 20 mg by mouth daily.         Marland Kitchen nystatin (MYCOSTATIN) powder      Apply to affected area 3 times daily   15 g   0   . pantoprazole (PROTONIX) 40 MG tablet   Oral   Take 40 mg by mouth daily.         . potassium chloride SA (K-DUR,KLOR-CON) 20 MEQ tablet   Oral   Take 2 tablets (40 mEq total) by mouth daily.   5 tablet   0   . QUEtiapine (SEROQUEL XR) 50 MG TB24   Oral   Take 50 mg by mouth daily.         . Travoprost, BAK Free, (TRAVATAN) 0.004 % SOLN ophthalmic solution   Both Eyes   Place 1 drop into both eyes at bedtime.           BP 193/97  Pulse 71  Temp(Src) 97.7 F (36.5 C) (Oral)  Resp 24  SpO2 100%  Physical Exam  Nursing note and vitals reviewed. Constitutional: She is oriented to person, place, and time. She appears well-developed and well-nourished. No  distress.  HENT:  Head: Normocephalic.  Dry MM  Contusion to left zygoma, no tenderness or crepitance to orbital rims, extraocular movement intact  Eyes: Conjunctivae and EOM are normal. Pupils are equal, round, and reactive to light.  Cardiovascular: Normal rate, regular rhythm and intact distal pulses.   Pulmonary/Chest: Effort normal and breath sounds normal. No stridor. No respiratory distress. She has no wheezes. She has no rales. She exhibits no tenderness.  Abdominal: Soft. Bowel sounds are normal. She exhibits no distension and no mass. There is no tenderness. There is no rebound and no guarding.  Musculoskeletal: Normal range of motion.  Neurological: She is alert and oriented to person, place, and time.  Pt is a bit contracted and leans toward her left. Pupils are equal round and reactive to light, strength is 3/5x4 extremities, no facial droop, asymmetry or slurring  Psychiatric: She has a normal mood and affect.  mild dementia    ED Course  Procedures (including critical care time)  Labs Reviewed  CBC WITH DIFFERENTIAL - Abnormal; Notable for the following:    WBC 10.6 (*)    RBC 5.52 (*)    Hemoglobin 16.4 (*)    HCT 47.3 (*)    Platelets 136 (*)    Neutrophils Relative 84 (*)    Neutro Abs 8.9 (*)    Lymphocytes Relative 11 (*)    All other components within normal limits  CK - Abnormal; Notable for the following:    Total CK 795 (*)    All other components within normal limits  CG4 I-STAT (LACTIC ACID) - Abnormal; Notable for the following:    Lactic Acid, Venous 3.70 (*)    All other components within normal limits  URINALYSIS, ROUTINE W REFLEX MICROSCOPIC  COMPREHENSIVE METABOLIC PANEL  POCT I-STAT TROPONIN I  POCT I-STAT TROPONIN I   Dg Chest 2 View  02/27/2013  *RADIOLOGY REPORT*  Clinical Data: Weakness  CHEST - 2 VIEW  Comparison: 06/04/2012  Findings: The cardiac silhouette is mildly enlarged.  No  mediastinal or hilar masses.  The lungs are clear.  The  bony thorax is demineralized but intact.  IMPRESSION: No acute cardiopulmonary disease   Original Report Authenticated By: Amie Portland, M.D.    Ct Head Wo Contrast  02/27/2013  *RADIOLOGY REPORT*  Clinical Data:  79 year old female with severe headache.  Weakness. Found down.  CT HEAD WITHOUT CONTRAST CT CERVICAL SPINE WITHOUT CONTRAST  Technique:  Multidetector CT imaging of the head and cervical spine was performed following the standard protocol without intravenous contrast.  Multiplanar CT image reconstructions of the cervical spine were also generated.  Comparison:  Brain MRI 04/27/2012 and earlier.  Cervical spine radiographs 01/08/2010.  CT HEAD  Findings: No scalp hematoma identified.  No acute orbit soft tissue findings.  Minimal ethmoid sinus mucosal thickening.  Other Visualized paranasal sinuses and mastoids are clear.  Hyperostosis frontalis.  Osteopenia.  Calvarium intact.  Dural calcifications. Calcified atherosclerosis at the skull base.  Patchy and confluent cerebral white matter hypodensity appears stable.  No ventriculomegaly. No midline shift, mass effect, or evidence of mass lesion.  No evidence of cortically based acute infarction identified.  No acute intracranial hemorrhage identified.  IMPRESSION: 1.  Stable. No acute intracranial abnormality.  No acute traumatic injury identified. 2.  Cervical spine findings are below.  CT CERVICAL SPINE  Findings: Visualized skull base is intact.  No atlanto-occipital dissociation.  2 mm of retrolisthesis of C3 on C4 and similar anterolisthesis of C4 on C5.  Trace anterolisthesis of C7 on T1. Bilateral posterior element alignment is within normal limits. Multilevel cervical disc and facet degeneration.  Disc space loss and endplate spurring maximal at C3-C4.  No acute cervical fracture identified.  Calcified carotid atherosclerosis in the neck.  Negative visualized lung parenchyma.  Retained secretions in the thoracic esophagus.  Visualized upper  thoracic levels remarkable for mild T4 compression fracture, favor chronic.  IMPRESSION: 1. No acute fracture or listhesis identified in the cervical spine. Ligamentous injury is not excluded.  Multilevel degenerative changes.  2.  Mild T4 compression fracture, appears to be chronic.   Original Report Authenticated By: Erskine Speed, M.D.    Ct Cervical Spine Wo Contrast  02/27/2013  *RADIOLOGY REPORT*  Clinical Data:  79 year old female with severe headache.  Weakness. Found down.  CT HEAD WITHOUT CONTRAST CT CERVICAL SPINE WITHOUT CONTRAST  Technique:  Multidetector CT imaging of the head and cervical spine was performed following the standard protocol without intravenous contrast.  Multiplanar CT image reconstructions of the cervical spine were also generated.  Comparison:  Brain MRI 04/27/2012 and earlier.  Cervical spine radiographs 01/08/2010.  CT HEAD  Findings: No scalp hematoma identified.  No acute orbit soft tissue findings.  Minimal ethmoid sinus mucosal thickening.  Other Visualized paranasal sinuses and mastoids are clear.  Hyperostosis frontalis.  Osteopenia.  Calvarium intact.  Dural calcifications. Calcified atherosclerosis at the skull base.  Patchy and confluent cerebral white matter hypodensity appears stable.  No ventriculomegaly. No midline shift, mass effect, or evidence of mass lesion.  No evidence of cortically based acute infarction identified.  No acute intracranial hemorrhage identified.  IMPRESSION: 1.  Stable. No acute intracranial abnormality.  No acute traumatic injury identified. 2.  Cervical spine findings are below.  CT CERVICAL SPINE  Findings: Visualized skull base is intact.  No atlanto-occipital dissociation.  2 mm of retrolisthesis of C3 on C4 and similar anterolisthesis of C4 on C5.  Trace anterolisthesis of C7 on T1. Bilateral posterior element alignment is within  normal limits. Multilevel cervical disc and facet degeneration.  Disc space loss and endplate spurring maximal  at C3-C4.  No acute cervical fracture identified.  Calcified carotid atherosclerosis in the neck.  Negative visualized lung parenchyma.  Retained secretions in the thoracic esophagus.  Visualized upper thoracic levels remarkable for mild T4 compression fracture, favor chronic.  IMPRESSION: 1. No acute fracture or listhesis identified in the cervical spine. Ligamentous injury is not excluded.  Multilevel degenerative changes.  2.  Mild T4 compression fracture, appears to be chronic.   Original Report Authenticated By: Erskine Speed, M.D.      Date: 02/27/2013  Rate: 64  Rhythm: normal sinus rhythm  QRS Axis: normal  Intervals: normal  ST/T Wave abnormalities: normal  Conduction Disutrbances:none  Narrative Interpretation:   Old EKG Reviewed: unchanged   1. Syncope and collapse   2. Lactic acid blood increased   3. Dehydration       MDM   SHAKITA KEIR is a 79 y.o. female with syncope, found down for approximately 7 hours. Appears dehydrated. Contusion to right zygoma. Neurologic exam is grossly normal.  Head, C-spine and chest x-ray show no acute abnormalities.  EKG is nonischemic, troponin is negative. Patient is a mild leukocytosis at 10.8. CK is mildly elevated at 795. Patient has an elevated lactic acidosis of 3.7. She is extremely hemoconcentrated with an H&H of 16.4 over 47.3. Urinalysis is not possible because patient is so dry that and out cath yielded no urine.  Patient will be admitted to a step down bed by [facility] Dr. Lavera Guise.   Blood and urine cultures ordered.   Filed Vitals:   02/27/13 1145 02/27/13 1334 02/27/13 1345 02/27/13 1421  BP: 213/75 216/59 223/79 213/70  Pulse: 61 64 66   Temp: 96.6 F (35.9 C)     TempSrc: Rectal     Resp: 20 32 24   SpO2: 99% 99% 99%       Wynetta Emery, PA-C 02/27/13 1550

## 2013-02-27 NOTE — ED Notes (Signed)
Pt presents via EMS with c/o generalized weakness. Pt was found in a prone position on the floor via EMS. Per pt, she says she did not fall, but she sat on the floor in the bathroom beside the toilet because she was too weak to get up. Per EMS, pt had vomited once. Pt was laying on the right side of her face when EMS arrived. Pt lives with her husband who is in a wheelchair, unsure of how long patient was actually in the floor. Small, raised, red area on the right side of pt's face. Pt ambulates with a walker at home.

## 2013-02-27 NOTE — ED Notes (Signed)
Family at bedside. 

## 2013-02-27 NOTE — ED Notes (Signed)
Joni Reining Pisciotta PA made aware of pt's blood pressure.

## 2013-02-27 NOTE — ED Notes (Signed)
Family at bedside.offer pt's family members something to drink

## 2013-02-27 NOTE — ED Notes (Signed)
Pt returned from CT and xray. 

## 2013-02-28 DIAGNOSIS — N179 Acute kidney failure, unspecified: Secondary | ICD-10-CM

## 2013-02-28 DIAGNOSIS — J9600 Acute respiratory failure, unspecified whether with hypoxia or hypercapnia: Secondary | ICD-10-CM

## 2013-02-28 LAB — CBC
Hemoglobin: 12.6 g/dL (ref 12.0–15.0)
Platelets: 148 10*3/uL — ABNORMAL LOW (ref 150–400)
RBC: 4.33 MIL/uL (ref 3.87–5.11)
WBC: 8.8 10*3/uL (ref 4.0–10.5)

## 2013-02-28 LAB — GLUCOSE, CAPILLARY
Glucose-Capillary: 117 mg/dL — ABNORMAL HIGH (ref 70–99)
Glucose-Capillary: 111 mg/dL — ABNORMAL HIGH (ref 70–99)
Glucose-Capillary: 120 mg/dL — ABNORMAL HIGH (ref 70–99)
Glucose-Capillary: 128 mg/dL — ABNORMAL HIGH (ref 70–99)

## 2013-02-28 LAB — BASIC METABOLIC PANEL
CO2: 29 mEq/L (ref 19–32)
Calcium: 9.2 mg/dL (ref 8.4–10.5)
Calcium: 9.3 mg/dL (ref 8.4–10.5)
Creatinine, Ser: 0.63 mg/dL (ref 0.50–1.10)
GFR calc Af Amer: >90 mL/min (ref >90)
GFR calc non Af Amer: 81 mL/min — ABNORMAL LOW (ref >90)
GFR calc non Af Amer: 81 mL/min — ABNORMAL LOW (ref >90)
Glucose, Bld: 120 mg/dL — ABNORMAL HIGH (ref 70–99)
Potassium: 2.7 mEq/L — CL (ref 3.5–5.1)
Sodium: 138 mEq/L (ref 135–145)
Sodium: 135 mEq/L (ref 135–145)

## 2013-02-28 LAB — URINE CULTURE
Colony Count: NO GROWTH
Culture: NO GROWTH

## 2013-02-28 LAB — HEPATIC FUNCTION PANEL
Bilirubin, Direct: 0.2 mg/dL (ref 0.0–0.3)
Indirect Bilirubin: 0.7 mg/dL (ref 0.3–0.9)
Total Bilirubin: 0.9 mg/dL (ref 0.3–1.2)

## 2013-02-28 LAB — TSH: TSH: 5.21 u[IU]/mL — ABNORMAL HIGH (ref 0.350–4.500)

## 2013-02-28 LAB — MAGNESIUM: Magnesium: 1.9 mg/dL (ref 1.5–2.5)

## 2013-02-28 MED ORDER — POTASSIUM CHLORIDE 10 MEQ/100ML IV SOLN
10.0000 meq | INTRAVENOUS | Status: AC
  Administered 2013-02-28 (×4): 10 meq via INTRAVENOUS
  Filled 2013-02-28: qty 400

## 2013-02-28 MED ORDER — POTASSIUM CHLORIDE CRYS ER 20 MEQ PO TBCR
40.0000 meq | EXTENDED_RELEASE_TABLET | Freq: Every day | ORAL | Status: DC
  Administered 2013-02-28 – 2013-03-02 (×3): 40 meq via ORAL
  Filled 2013-02-28 (×3): qty 2

## 2013-02-28 MED ORDER — DONEPEZIL HCL 5 MG PO TABS
5.0000 mg | ORAL_TABLET | Freq: Every day | ORAL | Status: DC
  Administered 2013-02-28: 5 mg via ORAL
  Filled 2013-02-28 (×3): qty 1

## 2013-02-28 NOTE — Progress Notes (Signed)
Clinical Social Work Department BRIEF PSYCHOSOCIAL ASSESSMENT 02/28/2013  Patient:  CIA, GARRETSON     Account Number:  [id number]     Admit date:  02/27/2013  Clinical Social Worker:  Margaree Mackintosh  Date/Time:  02/28/2013 12:00 M  Referred by:  Physician  Date Referred:  02/28/2013 Referred for  SNF Placement   Other Referral:   Interview type:  Family Other interview type:    PSYCHOSOCIAL DATA Living Status:  FAMILY Admitted from facility:   Level of care:   Primary support name:  Trudie Buckler: [phone] Primary support relationship to patient:  CHILD, ADULT Degree of support available:   Adequate.    CURRENT CONCERNS Current Concerns  Post-Acute Placement   Other Concerns:    SOCIAL WORK ASSESSMENT / PLAN Clinical Social Worker received referral indicating potential need for SNF at dc.  CSW reviewed chart and staffed case with PT.  PT and OT currently recommending SNF at dc due to pt's fall risk and decondition.  PT reports pt currently displays difficulty with full orientation.  CSW spoke with pt's dtr, Thelma Barge.  CSW introduced self, explained role, and provided support.  Thelma Barge agreeable to SNF search and expressed interest in [place].  CSW to continue to follow and assist as needed.   Assessment/plan status:  Information/Referral to [facility] Other assessment/ plan:   Information/referral to community resources:   SNF    PATIENT'S/FAMILY'S RESPONSE TO PLAN OF CARE: Pt currenlty unable to fully participate in assessment due to medical condition.  Dtr thanked CSW for intervention.

## 2013-02-28 NOTE — Evaluation (Signed)
Physical Therapy Evaluation Patient Details Name: Megan [name] MRN: [id number] DOB: 02-14-1927 Today's Date: 02/28/2013 Time: 0865-7846 PT Time Calculation (min): 25 min  PT Assessment / Plan / Recommendation Clinical Impression  Pt is an 79 yo female who sustained a mechanical fall s/p syncopal episode. Pt with h/o dementia but now also with confusion compared to PTA per family. Pt was ambulating with RW mod I'ly now unable to complete standing without maxA. spoke with family and recommending SNF placement upon d/c to allow pt to achieve safe mod I function prior to transition home. Pt's spouse is w/c bound and famly unable to provide 24/7 supervision/assist at this time. Pt unsafe to return home at this time and would strongly benefit from SNF placement.    PT Assessment  Patient needs continued PT services    Follow Up Recommendations  SNF;Supervision/Assistance - 24 hour    Does the patient have the potential to tolerate intense rehabilitation      Barriers to Discharge Decreased caregiver support      Equipment Recommendations       Recommendations for Other Services     Frequency Min 3X/week    Precautions / Restrictions Precautions Precautions: Fall Precaution Comments: confusion/dementia Restrictions Weight Bearing Restrictions: No   Pertinent Vitals/Pain Pt unable to communicate if in pain or not      Mobility  Bed Mobility Bed Mobility: Supine to Sit;Sit to Supine;Scooting to HOB Supine to Sit: 2: Max assist;HOB elevated Sit to Supine: 2: Max assist;HOB flat Scooting to HOB: 1: +2 Total assist Scooting to [facility]: Patient Percentage: 10% Details for Bed Mobility Assistance: pt with poor command follow, dec. comprehension to complete task asked. assist for trunk elevation and LE management, max directional v/c's Transfers Transfers: Sit to Stand;Stand to Sit Sit to Stand: 1: +2 Total assist;With upper extremity assist;From bed Sit to Stand: Patient Percentage:  40% Stand to Sit: 1: +2 Total assist;To bed Stand to Sit: Patient Percentage: 50% Details for Transfer Assistance: attempted to stand x 3 however unsuccesful due to patient repeatedly stating "i can't, i can't." Unable to achieve full upright standing posture, significant trunk flexion and bilat knee hyperextension.  max tactile cueing to achieve bilat hip ext however unable to maintain upright posture Ambulation/Gait Ambulation/Gait Assistance: Not tested (comment)    Exercises     PT Diagnosis: Difficulty walking;Generalized weakness  PT Problem List: Decreased activity tolerance;Decreased balance;Decreased mobility;Decreased strength;Decreased safety awareness PT Treatment Interventions: Gait training;Functional mobility training;Therapeutic activities;Therapeutic exercise;Stair training;Balance training   PT Goals Acute Rehab PT Goals PT Goal Formulation: With family Time For Goal Achievement: 03/14/13 Potential to Achieve Goals: Good Pt will go Supine/Side to Sit: with supervision;with HOB 0 degrees PT Goal: Supine/Side to Sit - Progress: Goal set today Pt will Sit at Edge of Bed: with supervision;3-5 min;with unilateral upper extremity support PT Goal: Sit at Edge Of Bed - Progress: Goal set today Pt will go Sit to Stand: with min assist;with upper extremity assist (up to RW) PT Goal: Sit to Stand - Progress: Goal set today Pt will Transfer Bed to Chair/Chair to Bed: with min assist (with RW) PT Transfer Goal: Bed to Chair/Chair to Bed - Progress: Goal set today Pt will Ambulate: 16 - 50 feet;with min assist;with rolling walker PT Goal: Ambulate - Progress: Goal set today  Visit Information  Last PT Received On: 02/28/13 Assistance Needed: +2    Subjective Data  Subjective: Pt received supine in bed with noted dementia/confusion.   Prior Functioning  Home Living Lives With: Spouse;Son Available Help at Discharge: Family;Available PRN/intermittently (spouse disabled in  w/c, son works at night) Type of Home: House Home Access: Stairs to enter Secretary/administrator of Steps: 3-5 Entrance Stairs-Rails: Can reach both Home Layout: One level Bathroom Shower/Tub: Tub/shower unit (pt sponge bathes with assist of dtr) Bathroom Toilet: Standard Home Adaptive Equipment: Walker - rolling Additional Comments: history provided by son and daughter Prior Function Level of Independence: Needs assistance Needs Assistance: Bathing;Dressing;Meal Prep;Light Housekeeping Bath: Maximal Dressing: Minimal Meal Prep: Total (son does the cooking) Light Housekeeping: Total Able to Take Stairs?: Yes Driving: No Vocation: Retired Comments: pt family reports they can not provide 24/7 assist Communication Communication: No difficulties Dominant Hand: Right    Cognition  Cognition Arousal/Alertness: Awake/alert Behavior During Therapy: WFL for tasks assessed/performed Overall Cognitive Status: Impaired/Different from baseline Area of Impairment: Following commands;Memory;Orientation;Attention;Safety/judgement (h/o dementia but increased confusion) Orientation Level: Disoriented to;Place;Time;Situation Current Attention Level: Focused Memory: Decreased short-term memory Following Commands: Follows one step commands inconsistently Safety/Judgement: Decreased awareness of safety;Decreased awareness of deficits (impulsive)    Extremity/Trunk Assessment Right Upper Extremity Assessment RUE ROM/Strength/Tone: Deficits RUE ROM/Strength/Tone Deficits: generalized weakness, decreased command follow for full assessment Left Upper Extremity Assessment LUE ROM/Strength/Tone: Deficits;Unable to fully assess;Due to impaired cognition LUE ROM/Strength/Tone Deficits: generalized weakness Right Lower Extremity Assessment RLE ROM/Strength/Tone: Deficits;Unable to fully assess;Due to impaired cognition RLE ROM/Strength/Tone Deficits: grossly atleast 3/5 based on functional  activity Left Lower Extremity Assessment LLE ROM/Strength/Tone: Deficits;Unable to fully assess;Due to impaired cognition LLE ROM/Strength/Tone Deficits: grossly atleast 3/5 based on function Trunk Assessment Trunk Assessment: Kyphotic   Balance Balance Balance Assessed: Yes Static Sitting Balance Static Sitting - Balance Support: Bilateral upper extremity supported Static Sitting - Level of Assistance: 4: Min assist Static Sitting - Comment/# of Minutes: pt with limited attn span to maintain sitting balance, unable to maintain upright posture with only unilateral UE support  End of Session PT - End of Session Equipment Utilized During Treatment: Gait belt Activity Tolerance: Patient limited by fatigue (limited by cognition) Patient left: in bed;with call bell/phone within reach;with bed alarm set Nurse Communication: Mobility status  GP     Marcene Brawn 02/28/2013, 2:28 PM  Lewis Shock, PT, DPT Pager #: [phone] Office #: [phone]

## 2013-02-28 NOTE — Progress Notes (Signed)
Pt refused.

## 2013-02-28 NOTE — Progress Notes (Addendum)
CRITICAL VALUE ALERT  Critical value received:  K: 2.7  Date of notification:  02/28/2013   Time of notification:  0605  Critical value read back:yes  Nurse who received alert:  Fransisco Hertz, RN   MD notified (1st page):  Merdis Delay, NP via text page  Time of first page:  0608  MD notified (2nd page):  Time of second page:  Responding MD:  Merdis Delay, NP   Time MD responded:  1610 Orders received

## 2013-02-28 NOTE — Progress Notes (Addendum)
Clinical Social Work Department CLINICAL SOCIAL WORK PLACEMENT NOTE 02/28/2013  Patient:  GWENNA, FUSTON  Account Number:  [id number] Admit date:  02/27/2013  Clinical Social Worker:  Margaree Mackintosh  Date/time:  02/28/2013 04:07 PM  Clinical Social Work is seeking post-discharge placement for this patient at the following level of care:   SKILLED NURSING   (*CSW will update this form in Epic as items are completed)   02/28/2013  Patient/family provided with [facility] Department of Clinical Social Work's list of facilities offering this level of care within the geographic area requested by the patient (or if unable, by the patient's family).  02/28/2013  Patient/family informed of their freedom to choose among providers that offer the needed level of care, that participate in Medicare, Medicaid or managed care program needed by the patient, have an available bed and are willing to accept the patient.  02/28/2013  Patient/family informed of [facility]' ownership interest in [facility], as well as of the fact that they are under no obligation to receive care at this facility.  PASARR submitted to EDS on 02/28/2013 PASARR number received from EDS on 02/28/2013  FL2 transmitted to all facilities in geographic area requested by pt/family on  02/28/2013 FL2 transmitted to all facilities within larger geographic area on   Patient informed that his/her managed care company has contracts with or will negotiate with  certain facilities, including the following:     Patient/family informed of bed offers received:  03/01/13 Patient chooses bed at [facility] Physician recommends and patient chooses bed at    Patient to be transferred to on   Patient to be transferred to facility by   The following physician request were entered in Epic:   Additional Comments: 03/02/13 - CSW talked with patient's daughter Patrina Levering regarding bed offers. Call made to [facility] in  admissions at [facility] and they can take patient when medically ready for discharge.

## 2013-02-28 NOTE — Evaluation (Signed)
Occupational Therapy Evaluation Patient Details Name: Megan [name] MRN: [id number] DOB: 1927-09-15 Today's Date: 02/28/2013 Time: 8295-6213 OT Time Calculation (min): 23 min  OT Assessment / Plan / Recommendation Clinical Impression   This 79 y.o. Female admitted after being found on the bathroom floor by caregiver.  Unsure how long pt was down, and the circumstances of the fall.  It was surmised that she had been on the floor all night.  Pt resides with husband who is a double amputee and uses a wheelchair.  They have a PCA from 9-4 M-F, but does not have 24 hour assist at discharge.  Pt. With very limited participation in eval due to severity of dementia/confusion.  Pt unable to attend to information/tasks.  She requires mod A to transfer to recliner, but is highly unsafe.  She will initiate self feeding.  She will require 24 hour assistance at discharge and will not be safe to be left alone.  Recommend SNF at discharge.   She will benefit from continued OT to maximize safety and independence with BADLs and to assist in facilitation of discharge to the next venue of care.   OT Assessment       Follow Up Recommendations  SNF;Supervision/Assistance - 24 hour    Barriers to Discharge      Equipment Recommendations  None recommended by OT    Recommendations for Other Services    Frequency       Precautions / Restrictions Precautions Precautions: Fall Precaution Comments: confusion/dementia Restrictions Weight Bearing Restrictions: No       ADL  Eating/Feeding: Set up Where Assessed - Eating/Feeding: Bed level;Chair Grooming: Wash/dry hands;Wash/dry face;Supervision/safety (mod vc) Where Assessed - Grooming: Supported sitting Upper Body Bathing: +1 Total assistance Where Assessed - Upper Body Bathing: Supported sitting Lower Body Bathing: +1 Total assistance Where Assessed - Lower Body Bathing: Supported sit to stand Upper Body Dressing: +1 Total assistance Where Assessed -  Upper Body Dressing: Supported sitting Lower Body Dressing: +1 Total assistance Where Assessed - Lower Body Dressing: Supported sit to Pharmacist, hospital: Moderate assistance Toilet Transfer Method: Sit to stand;Stand pivot Toilet Transfer Equipment: Bedside commode Toileting - Clothing Manipulation and Hygiene: +1 Total assistance Where Assessed - Toileting Clothing Manipulation and Hygiene: Standing Transfers/Ambulation Related to ADLs: mod A and maximial vc's for transfer to chair ADL Comments: Pt unable to sustain attention to complete most ADLs.  She will sponatneously do things like self feeding, wash face, et.    OT Diagnosis:    OT Problem List:   OT Treatment Interventions:     OT Goals Acute Rehab OT Goals OT Goal Formulation: With family Time For Goal Achievement: 03/14/13 Potential to Achieve Goals: Fair ADL Goals Pt Will Perform Grooming: with min assist;Standing at sink ADL Goal: Grooming - Progress: Goal set today Pt Will Perform Upper Body Bathing: with min assist;Sitting, chair ADL Goal: Upper Body Bathing - Progress: Goal set today Pt Will Perform Lower Body Bathing: with mod assist;Sit to stand from chair;Sit to stand from bed ADL Goal: Lower Body Bathing - Progress: Goal set today Pt Will Transfer to Toilet: with min assist;Ambulation;Comfort height toilet;3-in-1 ADL Goal: Toilet Transfer - Progress: Goal set today Pt Will Perform Toileting - Clothing Manipulation: with min assist;Standing ADL Goal: Toileting - Clothing Manipulation - Progress: Goal set today  Visit Information  Last OT Received On: 02/28/13 Assistance Needed: +2    Subjective Data  Subjective: "It takes me a long time to get started" Patient Stated  Goal: Pt unable to state   Prior Functioning     Home Living Lives With: Spouse;Son Available Help at Discharge: Family;Available PRN/intermittently;Personal care attendant (spouse disabled in w/c, son works at night; PCA 9-4 M-F) Type  of Home: House Home Access: Stairs to enter [employer] of Steps: 3-5 Entrance Stairs-Rails: Can reach both Home Layout: One level Bathroom Shower/Tub: Tub/shower unit (pt sponge bathes with assist of dtr; garden tub) Firefighter: Standard Home Adaptive Equipment: Walker - rolling;Straight cane;Wheelchair - manual Additional Comments: history provided by son and daughter Prior Function Level of Independence: Needs assistance Needs Assistance: Bathing;Dressing;Meal Prep;Light Housekeeping Bath: Maximal Dressing: Minimal Meal Prep: Total (son does the cooking) Light Housekeeping: Total Able to Take Stairs?: Yes Driving: No Vocation: Retired Comments: pt family reports they can not provide 24/7 assist Communication Communication: No difficulties Dominant Hand: Right         Vision/Perception     Cognition  Cognition Arousal/Alertness: Awake/alert Behavior During Therapy: WFL for tasks assessed/performed Overall Cognitive Status: Impaired/Different from baseline Area of Impairment: Orientation;Attention;Memory;Following commands;Safety/judgement;Awareness;Problem solving Orientation Level: Disoriented to;Place;Time;Situation Current Attention Level: Focused Memory: Decreased short-term memory Following Commands: Follows one step commands inconsistently Safety/Judgement: Decreased awareness of safety;Decreased awareness of deficits Problem Solving: Slow processing;Decreased initiation;Difficulty sequencing;Requires verbal cues;Requires tactile cues General Comments: Pt with h/o dementia    Extremity/Trunk Assessment Right Upper Extremity Assessment RUE ROM/Strength/Tone: Within functional levels (as assessed through function ) RUE ROM/Strength/Tone Deficits: generalized weakness, decreased command follow for full assessment RUE Coordination: WFL - gross/fine motor Left Upper Extremity Assessment LUE ROM/Strength/Tone: Within functional levels (as assessed  through function ) LUE ROM/Strength/Tone Deficits: generalized weakness LUE Coordination: WFL - gross/fine motor Right Lower Extremity Assessment RLE ROM/Strength/Tone: Deficits;Unable to fully assess;Due to impaired cognition RLE ROM/Strength/Tone Deficits: grossly atleast 3/5 based on functional activity Left Lower Extremity Assessment LLE ROM/Strength/Tone: Deficits;Unable to fully assess;Due to impaired cognition LLE ROM/Strength/Tone Deficits: grossly atleast 3/5 based on function Trunk Assessment Trunk Assessment: Kyphotic     Mobility Bed Mobility Bed Mobility: Supine to Sit;Sitting - Scoot to Edge of Bed Supine to Sit: 2: Max assist;HOB flat Sitting - Scoot to [place] of Bed: 2: Max assist Sit to Supine: 2: Max assist;HOB flat Scooting to HOB: 1: +2 Total assist Scooting to [facility]: Patient Percentage: 10% Details for Bed Mobility Assistance: Pt requires assist for all aspects as she is unable to initiate taks due to poor attention Transfers Transfers: Sit to Stand;Stand to Sit Sit to Stand: 3: Mod assist;With upper extremity assist;From bed Sit to Stand: Patient Percentage: 40% Stand to Sit: 3: Mod assist;With upper extremity assist;To chair/3-in-1 Stand to Sit: Patient Percentage: 50% Details for Transfer Assistance: Pt will not initiate movement.  Did stand x 2, but reaching for objects and poor safety.  Pt. requires max verbal and tactile cues to pivot to chair     Exercise     Balance Balance Balance Assessed: Yes Static Sitting Balance Static Sitting - Balance Support: Bilateral upper extremity supported Static Sitting - Level of Assistance: 4: Min assist Static Sitting - Comment/# of Minutes: pt with limited attn span to maintain sitting balance, unable to maintain upright posture with only unilateral UE support   End of Session OT - End of Session Activity Tolerance: Other (comment) (cognition limits participation) Patient left: in chair;with call bell/phone within  reach;with family/visitor present (son arrived at end of session) Nurse Communication: Mobility status  GO     Nainika Newlun, Ursula Alert M 02/28/2013, 2:52 PM

## 2013-02-28 NOTE — Progress Notes (Signed)
eeg completed at bedside

## 2013-02-28 NOTE — Progress Notes (Signed)
INITIAL NUTRITION ASSESSMENT  DOCUMENTATION CODES Per approved criteria  -Non-severe (moderate) malnutrition in the context of chronic illness   INTERVENTION: 1.  General healthful diet; provide feeding assistance.  Encourage intake of food and beverages. 2.  Supplements; Mighty shake BID with breakfast and dinner trays.   NUTRITION DIAGNOSIS: Unintended wt loss related to AMS as evidenced by 30 lbs wt loss in 1 yr, baseline dementia.   Monitor:  1.  Food/Beverage; improvement in intake to meet >/=90% estimated needs 2.  Wt/wt change; deter loss  Reason for Assessment: MST  79 y.o. female  Admitting Dx: Syncope  ASSESSMENT: Pt admitted after being found down in home by caregiver.  Pt with baseline dementia; confused at time of visit. Pt beginning work with PT at time of visit.  Unable to perform nutrition-focused physical exam at this time.  Did observe breakfast tray with 100% of muffin consumed, however remained of tray was largely crumbled into pieces and scattered on tray, table, and in bed (Milk was found folded up in covers with pt).  Will request pt be fed with assistance.   Pt's wt hx shows loss over the past 1 year.  Pt weighed 150 lbs in March 2013.  Has now lost ~30 lbs (20% in 1 year) which in clinically significant.  Pt with mild-moderate wasting in temples and clavicles.  RD to follow for complete nutrition-focused physical exam, however based on hx and limited exam, pt meets criteria for non-severe malnutrition of chronic illness.    Height: Ht Readings from Last 1 Encounters:  07/08/12 5\' 6"  (1.676 m)    Weight: Wt Readings from Last 1 Encounters:  02/28/13 121 lb 0.5 oz (54.9 kg)    Ideal Body Weight: 130 lbs  % Ideal Body Weight: 93%  Wt Readings from Last 10 Encounters:  02/28/13 121 lb 0.5 oz (54.9 kg)  07/08/12 125 lb 9.6 oz (56.972 kg)  06/16/12 133 lb (60.328 kg)  06/10/12 140 lb 6.9 oz (63.7 kg)  06/10/12 140 lb 6.9 oz (63.7 kg)  05/02/12 143  lb 1.6 oz (64.91 kg)  03/31/12 160 lb (72.576 kg)  03/31/12 160 lb (72.576 kg)    Usual Body Weight: 150-160 lbs per chart review  % Usual Body Weight: <80%  BMI:  Body mass index is 19.54 kg/(m^2).  Estimated Nutritional Needs: Kcal: 1510-1620 Protein: 65-76g Fluid: >1.8 L/day  Skin: intact  Diet Order: Cardiac  EDUCATION NEEDS: -Education needs addressed   Intake/Output Summary (Last 24 hours) at 02/28/13 1026 Last data filed at 02/28/13 0000  Gross per 24 hour  Intake    240 ml  Output    325 ml  Net    -85 ml    Last BM: 4/28  Labs:   Recent Labs Lab 02/27/13 1244 02/27/13 1631 02/28/13 0515  NA 138 141 138  K 4.1 2.8* 2.7*  CL 96 100 101  CO2 24  --  29  BUN 10 9 12   CREATININE 0.48* 0.60 0.62  CALCIUM 10.4  --  9.2  GLUCOSE 148* 143* 120*    CBG (last 3)   Recent Labs  02/28/13 0043 02/28/13 0425 02/28/13 0822  GLUCAP 112* 111* 120*    Scheduled Meds: . sodium chloride   Intravenous STAT  . amLODipine  10 mg Oral QHS  . bisoprolol  5 mg Oral Daily  . cefTRIAXone (ROCEPHIN)  IV  1 g Intravenous Q24H  . heparin  5,000 Units Subcutaneous Q8H  . levothyroxine  50  mcg Oral QAC breakfast  . pantoprazole  40 mg Oral Q1200  . potassium chloride  10 mEq Intravenous Q1 Hr x 4  . sodium chloride  3 mL Intravenous Q12H  . Travoprost (BAK Free)  1 drop Both Eyes QHS    Continuous Infusions:   Past Medical History  Diagnosis Date  . Hypertension   . Diastolic dysfunction, left ventricle by ECHO 2011 03/25/2012  . Hypothyroidism 03/25/2012  . High cholesterol   . Type II diabetes mellitus   . H/O hiatal hernia   . Duodenal perforation 03/23/12  . Arthritis     "in my back"    Past Surgical History  Procedure Laterality Date  . Bladder tacked    . Repair perforated ulcer  03/23/12  . Diagnostic laparoscopy  03/25/12    REPAIR OF PERFORATED ULCER with omental patch  . Abcess drainage  03/25/12     of abdominal & pelvic abscesses  .  Laparoscopy  03/24/2012    Procedure: LAPAROSCOPY DIAGNOSTIC;  Surgeon: Ardeth Sportsman, MD;  Location: [facility] OR;  Service: General;  Laterality: N/A;  drainage of abdominal and pelvic abcesses  . Laparotomy  06/03/2012    Procedure: EXPLORATORY LAPAROTOMY;  Surgeon: Mariella Saa, MD;  Location: [facility] OR;  Service: General;  Laterality: N/A;    Loyce Dys, MS RD LDN Clinical Inpatient Dietitian Pager: [phone] Weekend/After hours pager: [phone]

## 2013-02-28 NOTE — Progress Notes (Signed)
[facility] Progress Note [place] TEAM 1 - Stepdown/ICU TEAM   Megan [name] ZOX:[id number] DOB: [birth year] DOA: 02/27/2013 PCP: [name], MD  Brief narrative: 79 year old female with dementia who lives at home with her husband. She was found down at home by the caregiver who arrived to look after the patient. Patient had no recollection of these events. No family was available at the bedside for the admitting physician to interview. Apparently attempts made by ED physician as well as the admitting physician to contact the patient's husband and son were unsuccessful given no one had answered at the phone numbers provided. The caregiver was able to report that the patient was found face down in the bathroom when the same close she was wearing the night before and it was surmised the patient had been on the bathroom floor all night. In the emergency department she was found to be dehydrated with lactic acidosis and an elevated CPK consistent with rhabdomyolysis. CT of the head showed no acute process. Her EKG was nonischemic and cardiac enzymes were within normal limits except for the elevated CPK.  Assessment/Plan: Active Problems:   Acute delirium/Dementia -pt is an EXTREMELY poor historian and has no recollection of events preceding admission -very pleasant but will need further d/w family/caregivers regarding safety to return home -SW consult -resume Aricept -was on Seroquel extended release and Tranxene at home-? too sedating and currently on hold    Syncope -unclear if mechanical fall vs cardiac vs vasovagal syncope -telemetry unrevealing since admit -leaning toward mechanical fall vs orthostasis/was on several anti-HTN meds at home including low dose HCTZ -PT/OT eval and check OVS -pt was on diflucan and Levaquin at home so could have had NSVT-but Qtc was normal so doubt -EEG pending/no apparent seizure activity since admit    Rhabdomyolysis -initial CPK >700-  repeat in am post hydration    DM (diabetes mellitus), type 2 with complications -not on meds pre admit -CBG <150    Hypertension -cont home meds    Hypokalemia -IV and oral replete was on Kdur at home with HCTZ    Dehydration -cont IVF   Thrombocytopenia/mild leukocytosis/borderline anemia -new problem -urine dark so will check hepatic fnx panel to r/o hyper bilrubinemia/hemolysis -urine culture negative and no evidence of PNA on CXR so will dc Rocephin -suspect hgb will drop further with hydration so will check anemia panel    Diastolic dysfunction, left ventricle by ECHO 2011 -compensated    Hypothyroidism -check TSH -cont Synthroid   DVT prophylaxis: Subcutaneous heparin Code Status: Full-may need to be addressed prior to discharge Family Communication: Patient only no family at bedside Disposition Plan: Transfer to floor Isolation: None  Consultants: None  Procedures: EEG pending  Antibiotics: Rocephin 4/28 >>> 4/29  HPI/Subjective: Patient alert and quite pleasant. Speech difficult to understand at times. She is quite confused and is an extremely poor historian but is adamant that she did not fall!   Objective: Blood pressure 146/86, pulse 75, temperature 98 F (36.7 C), temperature source Oral, resp. rate 19, weight 54.9 kg (121 lb 0.5 oz), SpO2 98.00%.  Intake/Output Summary (Last 24 hours) at 02/28/13 1314 Last data filed at 02/28/13 0000  Gross per 24 hour  Intake    240 ml  Output    325 ml  Net    -85 ml     Exam: General: No acute respiratory distress Lungs: Clear to auscultation bilaterally without wheezes or crackles, RA Cardiovascular: Regular rate and rhythm  without murmur gallop or rub normal S1 and S2, no peripheral edema or JVD Abdomen: Nontender, nondistended, soft, bowel sounds positive, no rebound, no ascites, no appreciable mass Musculoskeletal: No significant cyanosis, clubbing of bilateral lower extremities Neurological:  Alert and oriented x name and questionable to play, moves all extremities x 4 without focal neurological deficits, CN 2-12 intact  Data Reviewed: Basic Metabolic Panel:  Recent Labs Lab 02/27/13 1244 02/27/13 1631 02/28/13 0515  NA 138 141 138  K 4.1 2.8* 2.7*  CL 96 100 101  CO2 24  --  29  GLUCOSE 148* 143* 120*  BUN 10 9 12   CREATININE 0.48* 0.60 0.62  CALCIUM 10.4  --  9.2   Liver Function Tests:  Recent Labs Lab 02/27/13 1244  AST 49*  ALT 13  ALKPHOS 75  BILITOT 0.9  PROT 8.1  ALBUMIN 3.4*   No results found for this basename: LIPASE, AMYLASE,  in the last 168 hours No results found for this basename: AMMONIA,  in the last 168 hours CBC:  Recent Labs Lab 02/27/13 1244 02/27/13 1631 02/28/13 0515  WBC 10.6*  --  8.8  NEUTROABS 8.9*  --   --   HGB 16.4* 15.3* 12.6  HCT 47.3* 45.0 36.9  MCV 85.7  --  85.2  PLT 136*  --  148*   Cardiac Enzymes:  Recent Labs Lab 02/27/13 1243  CKTOTAL 795*   BNP (last 3 results) No results found for this basename: PROBNP,  in the last 8760 hours CBG:  Recent Labs Lab 02/27/13 1704 02/27/13 2155 02/28/13 0043 02/28/13 0425 02/28/13 0822  GLUCAP 122* 117* 112* 111* 120*    Recent Results (from the past 240 hour(s))  CULTURE, BLOOD (ROUTINE X 2)     Status: None   Collection Time    02/27/13  3:45 PM      Result Value Range Status   Specimen Description BLOOD HAND LEFT   Final   Special Requests BOTTLES DRAWN AEROBIC AND ANAEROBIC 10CC   Final   Culture  Setup Time 02/27/2013 22:35   Final   Culture     Final   Value:        BLOOD CULTURE RECEIVED NO GROWTH TO DATE CULTURE WILL BE HELD FOR 5 DAYS BEFORE ISSUING A FINAL NEGATIVE REPORT   Report Status PENDING   Incomplete  CULTURE, BLOOD (ROUTINE X 2)     Status: None   Collection Time    02/27/13  4:00 PM      Result Value Range Status   Specimen Description BLOOD ARM LEFT   Final   Special Requests BOTTLES DRAWN AEROBIC ONLY 10CC   Final   Culture   Setup Time 02/27/2013 22:35   Final   Culture     Final   Value:        BLOOD CULTURE RECEIVED NO GROWTH TO DATE CULTURE WILL BE HELD FOR 5 DAYS BEFORE ISSUING A FINAL NEGATIVE REPORT   Report Status PENDING   Incomplete  URINE CULTURE     Status: None   Collection Time    02/27/13  4:07 PM      Result Value Range Status   Specimen Description URINE, RANDOM   Final   Special Requests NONE   Final   Culture  Setup Time 02/26/2013 16:41   Final   Colony Count NO GROWTH   Final   Culture NO GROWTH   Final   Report Status 02/28/2013 FINAL   Final  MRSA PCR SCREENING     Status: None   Collection Time    02/27/13  5:23 PM      Result Value Range Status   MRSA by PCR NEGATIVE  NEGATIVE Final   Comment:            The GeneXpert MRSA Assay (FDA     approved for NASAL specimens     only), is one component of a     comprehensive MRSA colonization     surveillance program. It is not     intended to diagnose MRSA     infection nor to guide or     monitor treatment for     MRSA infections.     Studies:  Recent x-ray studies have been reviewed in detail by the Attending Physician  Scheduled Meds:  Reviewed in detail by the Attending Physician   Junious Silk, ANP [facility] Office  [phone] Pager [phone]  On-Call/Text Page:      [email or web address]      password [email or web address]  If 7PM-7AM, please contact night-coverage [email or web address] Password [facility] 02/28/2013, 1:14 PM   LOS: 1 day   I have examined the patient, reviewed the chart and modified the above note which I agree with.   Read Bonelli,MD [phone] 02/28/2013, 4:53 PM

## 2013-03-01 LAB — CBC
MCH: 29 pg (ref 26.0–34.0)
MCHC: 33.8 g/dL (ref 30.0–36.0)
Platelets: 152 10*3/uL (ref 150–400)

## 2013-03-01 LAB — GLUCOSE, CAPILLARY
Glucose-Capillary: 122 mg/dL — ABNORMAL HIGH (ref 70–99)
Glucose-Capillary: 122 mg/dL — ABNORMAL HIGH (ref 70–99)

## 2013-03-01 LAB — MAGNESIUM: Magnesium: 1.9 mg/dL (ref 1.5–2.5)

## 2013-03-01 LAB — VITAMIN B12: Vitamin B-12: 433 pg/mL (ref 211–911)

## 2013-03-01 LAB — IRON AND TIBC: Saturation Ratios: 22 % (ref 20–55)

## 2013-03-01 LAB — RETICULOCYTES
RBC.: 4.17 MIL/uL (ref 3.87–5.11)
Retic Ct Pct: 0.9 % (ref 0.4–3.1)

## 2013-03-01 MED ORDER — PANTOPRAZOLE SODIUM 40 MG PO TBEC
40.0000 mg | DELAYED_RELEASE_TABLET | Freq: Two times a day (BID) | ORAL | Status: DC
  Administered 2013-03-01 – 2013-03-02 (×2): 40 mg via ORAL
  Filled 2013-03-01 (×2): qty 1

## 2013-03-01 NOTE — Progress Notes (Signed)
Utilization review completed.  

## 2013-03-01 NOTE — ED Provider Notes (Signed)
Medical screening examination/treatment/procedure(s) were conducted as a shared visit with non-physician practitioner(s) and myself.  I personally evaluated the patient during the encounter S/p fall in bathroom last pm. ?syncope. Currently alert, content, but very poor historian. Spine nt. Chest cta. abd soft nt.   Suzi Roots, MD 03/01/13 [phone]

## 2013-03-01 NOTE — Progress Notes (Signed)
[facility] PROGRESS NOTE  Megan [name] WUJ:[id number] DOB: 06-20-1927 DOA: 02/27/2013 PCP: [name], MD  Assessment/Plan: Active Problems:   DM (diabetes mellitus), type 2 with complications   Hypertension   Diastolic dysfunction, left ventricle by ECHO 2011   Hypothyroidism   Acute delirium   Syncope   Hypokalemia   Dementia   Rhabdomyolysis   Dehydration   Brief narrative:  79 year old female with dementia who lives at home with her husband. She was found down at home by the caregiver who arrived to look after the patient. Patient had no recollection of these events. No family was available at the bedside for the admitting physician to interview. Apparently attempts made by ED physician as well as the admitting physician to contact the patient's husband and son were unsuccessful given no one had answered at the phone numbers provided. The caregiver was able to report that the patient was found face down in the bathroom when the same close she was wearing the night before and it was surmised the patient had been on the bathroom floor all night. In the emergency department she was found to be dehydrated with lactic acidosis and an elevated CPK consistent with rhabdomyolysis. CT of the head showed no acute process. Her EKG was nonischemic and cardiac enzymes were within normal limits except for the elevated CPK.   Assessment/Plan:  Active Problems:  Acute delirium/Dementia  -pt is an EXTREMELY poor historian and has no recollection of events preceding admission  Social work consult for SNF -resume Aricept  -was on Seroquel extended release and Tranxene at home-? too sedating and currently on hold , will likely be discontinued   Syncope  -unclear if mechanical fall vs cardiac vs vasovagal syncope  -telemetry unrevealing since admit  -leaning toward mechanical fall vs orthostasis/was on several anti-HTN meds at home including low dose HCTZ  -PT/OT eval and check  OVS  -pt was on diflucan and Levaquin at home so could have had NSVT-but Qtc was normal so doubt  -EEG pending/no apparent seizure activity since admit    Rhabdomyolysis  -initial CPK >700- repeat in am post hydration   DM (diabetes mellitus), type 2 with complications  -not on meds pre admit  -CBG <150  Hypertension  -cont home meds  Hypokalemia  -IV and oral replete was on Kdur at home with HCTZ   Dehydration  -cont IVF   Thrombocytopenia/mild leukocytosis/borderline anemia  -new problem  -urine dark so will check hepatic fnx panel to r/o hyper bilrubinemia/hemolysis  -urine culture negative and no evidence of PNA on CXR so will dc Rocephin  -suspect hgb will drop further with hydration so will check anemia panel   Diastolic dysfunction, left ventricle by ECHO 2011  -compensated  Hypothyroidism  - TSH 5.21, check a free T4 -cont Synthroid    DVT prophylaxis: Subcutaneous heparin  Code Status: Full-may need to be addressed prior to discharge  Family Communication: Patient only no family at bedside  Disposition Plan: Possible discharge tomorrow to SNF  Isolation: None  Consultants:  None  Procedures:  EEG pending  Antibiotics:  Rocephin 4/28 >>> 4/29    HPI/Subjective:  Patient alert and quite pleasant. Speech difficult to understand at times. She is quite confused and is an extremely poor historian   Objective: Filed Vitals:   02/28/13 1809 02/28/13 1859 02/28/13 2024 03/01/13 0404  BP: 180/63 136/49 146/56   Pulse:  76 79   Temp:   98.5 F (36.9 C)  TempSrc:  Oral Oral Oral  Resp:  16 16 16   Height:   5\' 5"  (1.651 m)   Weight:   54.999 kg (121 lb 4 oz)   SpO2:  100% 100%     Intake/Output Summary (Last 24 hours) at 03/01/13 0801 Last data filed at 03/01/13 0457  Gross per 24 hour  Intake    240 ml  Output   1550 ml  Net  -1310 ml    Exam:  General: No acute respiratory distress  Lungs: Clear to auscultation bilaterally without wheezes or  crackles, RA  Cardiovascular: Regular rate and rhythm without murmur gallop or rub normal S1 and S2, no peripheral edema or JVD  Abdomen: Nontender, nondistended, soft, bowel sounds positive, no rebound, no ascites, no appreciable mass  Musculoskeletal: No significant cyanosis, clubbing of bilateral lower extremities  Neurological: Alert and oriented x name and questionable to play, moves all extremities x 4 without focal neurological deficits, CN 2-12 intact    Data Reviewed: Basic Metabolic Panel:  Recent Labs Lab 02/27/13 1244 02/27/13 1631 02/28/13 0515 02/28/13 1539  NA 138 141 138 135  K 4.1 2.8* 2.7* 3.4*  CL 96 100 101 100  CO2 24  --  29 28  GLUCOSE 148* 143* 120* 158*  BUN 10 9 12 13   CREATININE 0.48* 0.60 0.62 0.63  CALCIUM 10.4  --  9.2 9.3  MG  --   --  1.9  --     Liver Function Tests:  Recent Labs Lab 02/27/13 1244 02/28/13 1539  AST 49* 35  ALT 13 11  ALKPHOS 75 64  BILITOT 0.9 0.9  PROT 8.1 6.5  ALBUMIN 3.4* 2.7*   No results found for this basename: LIPASE, AMYLASE,  in the last 168 hours No results found for this basename: AMMONIA,  in the last 168 hours  CBC:  Recent Labs Lab 02/27/13 1244 02/27/13 1631 02/28/13 0515 03/01/13 0635  WBC 10.6*  --  8.8 7.1  NEUTROABS 8.9*  --   --   --   HGB 16.4* 15.3* 12.6 12.1  HCT 47.3* 45.0 36.9 35.8*  MCV 85.7  --  85.2 85.9  PLT 136*  --  148* 152    Cardiac Enzymes:  Recent Labs Lab 02/27/13 1243 02/28/13 1539  CKTOTAL 795* 792*   BNP (last 3 results) No results found for this basename: PROBNP,  in the last 8760 hours   CBG:  Recent Labs Lab 02/28/13 1636 02/28/13 2042 02/28/13 2231 03/01/13 0035 03/01/13 0229  GLUCAP 126* 128* 122* 137* 134*    Recent Results (from the past 240 hour(s))  CULTURE, BLOOD (ROUTINE X 2)     Status: None   Collection Time    02/27/13  3:45 PM      Result Value Range Status   Specimen Description BLOOD HAND LEFT   Final   Special Requests  BOTTLES DRAWN AEROBIC AND ANAEROBIC 10CC   Final   Culture  Setup Time 02/27/2013 22:35   Final   Culture     Final   Value:        BLOOD CULTURE RECEIVED NO GROWTH TO DATE CULTURE WILL BE HELD FOR 5 DAYS BEFORE ISSUING A FINAL NEGATIVE REPORT   Report Status PENDING   Incomplete  CULTURE, BLOOD (ROUTINE X 2)     Status: None   Collection Time    02/27/13  4:00 PM      Result Value Range Status   Specimen Description BLOOD  ARM LEFT   Final   Special Requests BOTTLES DRAWN AEROBIC ONLY 10CC   Final   Culture  Setup Time 02/27/2013 22:35   Final   Culture     Final   Value:        BLOOD CULTURE RECEIVED NO GROWTH TO DATE CULTURE WILL BE HELD FOR 5 DAYS BEFORE ISSUING A FINAL NEGATIVE REPORT   Report Status PENDING   Incomplete  URINE CULTURE     Status: None   Collection Time    02/27/13  4:07 PM      Result Value Range Status   Specimen Description URINE, RANDOM   Final   Special Requests NONE   Final   Culture  Setup Time 02/26/2013 16:41   Final   Colony Count NO GROWTH   Final   Culture NO GROWTH   Final   Report Status 02/28/2013 FINAL   Final  MRSA PCR SCREENING     Status: None   Collection Time    02/27/13  5:23 PM      Result Value Range Status   MRSA by PCR NEGATIVE  NEGATIVE Final   Comment:            The GeneXpert MRSA Assay (FDA     approved for NASAL specimens     only), is one component of a     comprehensive MRSA colonization     surveillance program. It is not     intended to diagnose MRSA     infection nor to guide or     monitor treatment for     MRSA infections.     Studies: Dg Chest 2 View  02/27/2013  *RADIOLOGY REPORT*  Clinical Data: Weakness  CHEST - 2 VIEW  Comparison: 06/04/2012  Findings: The cardiac silhouette is mildly enlarged.  No mediastinal or hilar masses.  The lungs are clear.  The bony thorax is demineralized but intact.  IMPRESSION: No acute cardiopulmonary disease   Original Report Authenticated By: Amie Portland, M.D.    Ct Head Wo  Contrast  02/27/2013  *RADIOLOGY REPORT*  Clinical Data:  79 year old female with severe headache.  Weakness. Found down.  CT HEAD WITHOUT CONTRAST CT CERVICAL SPINE WITHOUT CONTRAST  Technique:  Multidetector CT imaging of the head and cervical spine was performed following the standard protocol without intravenous contrast.  Multiplanar CT image reconstructions of the cervical spine were also generated.  Comparison:  Brain MRI 04/27/2012 and earlier.  Cervical spine radiographs 01/08/2010.  CT HEAD  Findings: No scalp hematoma identified.  No acute orbit soft tissue findings.  Minimal ethmoid sinus mucosal thickening.  Other Visualized paranasal sinuses and mastoids are clear.  Hyperostosis frontalis.  Osteopenia.  Calvarium intact.  Dural calcifications. Calcified atherosclerosis at the skull base.  Patchy and confluent cerebral white matter hypodensity appears stable.  No ventriculomegaly. No midline shift, mass effect, or evidence of mass lesion.  No evidence of cortically based acute infarction identified.  No acute intracranial hemorrhage identified.  IMPRESSION: 1.  Stable. No acute intracranial abnormality.  No acute traumatic injury identified. 2.  Cervical spine findings are below.  CT CERVICAL SPINE  Findings: Visualized skull base is intact.  No atlanto-occipital dissociation.  2 mm of retrolisthesis of C3 on C4 and similar anterolisthesis of C4 on C5.  Trace anterolisthesis of C7 on T1. Bilateral posterior element alignment is within normal limits. Multilevel cervical disc and facet degeneration.  Disc space loss and endplate spurring maximal at C3-C4.  No acute cervical fracture identified.  Calcified carotid atherosclerosis in the neck.  Negative visualized lung parenchyma.  Retained secretions in the thoracic esophagus.  Visualized upper thoracic levels remarkable for mild T4 compression fracture, favor chronic.  IMPRESSION: 1. No acute fracture or listhesis identified in the cervical spine.  Ligamentous injury is not excluded.  Multilevel degenerative changes.  2.  Mild T4 compression fracture, appears to be chronic.   Original Report Authenticated By: Erskine Speed, M.D.    Ct Cervical Spine Wo Contrast  02/27/2013  *RADIOLOGY REPORT*  Clinical Data:  79 year old female with severe headache.  Weakness. Found down.  CT HEAD WITHOUT CONTRAST CT CERVICAL SPINE WITHOUT CONTRAST  Technique:  Multidetector CT imaging of the head and cervical spine was performed following the standard protocol without intravenous contrast.  Multiplanar CT image reconstructions of the cervical spine were also generated.  Comparison:  Brain MRI 04/27/2012 and earlier.  Cervical spine radiographs 01/08/2010.  CT HEAD  Findings: No scalp hematoma identified.  No acute orbit soft tissue findings.  Minimal ethmoid sinus mucosal thickening.  Other Visualized paranasal sinuses and mastoids are clear.  Hyperostosis frontalis.  Osteopenia.  Calvarium intact.  Dural calcifications. Calcified atherosclerosis at the skull base.  Patchy and confluent cerebral white matter hypodensity appears stable.  No ventriculomegaly. No midline shift, mass effect, or evidence of mass lesion.  No evidence of cortically based acute infarction identified.  No acute intracranial hemorrhage identified.  IMPRESSION: 1.  Stable. No acute intracranial abnormality.  No acute traumatic injury identified. 2.  Cervical spine findings are below.  CT CERVICAL SPINE  Findings: Visualized skull base is intact.  No atlanto-occipital dissociation.  2 mm of retrolisthesis of C3 on C4 and similar anterolisthesis of C4 on C5.  Trace anterolisthesis of C7 on T1. Bilateral posterior element alignment is within normal limits. Multilevel cervical disc and facet degeneration.  Disc space loss and endplate spurring maximal at C3-C4.  No acute cervical fracture identified.  Calcified carotid atherosclerosis in the neck.  Negative visualized lung parenchyma.  Retained secretions  in the thoracic esophagus.  Visualized upper thoracic levels remarkable for mild T4 compression fracture, favor chronic.  IMPRESSION: 1. No acute fracture or listhesis identified in the cervical spine. Ligamentous injury is not excluded.  Multilevel degenerative changes.  2.  Mild T4 compression fracture, appears to be chronic.   Original Report Authenticated By: Erskine Speed, M.D.    Dg Abd Portable 1v  02/27/2013  *RADIOLOGY REPORT*  Clinical Data: Abdominal pain with weakness.  PORTABLE ABDOMEN - 1 VIEW  Comparison: 06/07/2012 and 06/03/2012.  Findings: The bowel gas pattern is normal.  There is no supine evidence of free intraperitoneal air.  Scattered vascular calcifications are noted.  There is curvilinear calcification on the left, corresponding with peripheral calcification within a left renal cyst on prior CT.  There is a moderate convex right lumbar scoliosis with associated spondylosis.  Chronic degenerative changes of the hips and sclerosis adjacent to the right sacroiliac joint are stable.  IMPRESSION: No acute abdominal findings.   Original Report Authenticated By: Carey Bullocks, M.D.     Scheduled Meds: . amLODipine  10 mg Oral QHS  . bisoprolol  5 mg Oral Daily  . donepezil  5 mg Oral QHS  . heparin  5,000 Units Subcutaneous Q8H  . levothyroxine  50 mcg Oral QAC breakfast  . pantoprazole  40 mg Oral Q1200  . potassium chloride SA  40 mEq Oral Daily  . sodium  chloride  3 mL Intravenous Q12H  . Travoprost (BAK Free)  1 drop Both Eyes QHS   Continuous Infusions:   Active Problems:   DM (diabetes mellitus), type 2 with complications   Hypertension   Diastolic dysfunction, left ventricle by ECHO 2011   Hypothyroidism   Acute delirium   Syncope   Hypokalemia   Dementia   Rhabdomyolysis   Dehydration    Time spent: 40 minutes   [facility]  [facility] Pager [phone]. If 8PM-8AM, please contact night-coverage at [email or web address], password [facility] 03/01/2013, 8:01 AM   LOS: 2 days

## 2013-03-02 DIAGNOSIS — R55 Syncope and collapse: Secondary | ICD-10-CM

## 2013-03-02 DIAGNOSIS — R404 Transient alteration of awareness: Secondary | ICD-10-CM

## 2013-03-02 DIAGNOSIS — F0390 Unspecified dementia without behavioral disturbance: Secondary | ICD-10-CM

## 2013-03-02 LAB — BASIC METABOLIC PANEL
BUN: 10 mg/dL (ref 6–23)
Chloride: 105 mEq/L (ref 96–112)
Creatinine, Ser: 0.55 mg/dL (ref 0.50–1.10)
GFR calc Af Amer: >90 mL/min (ref >90)

## 2013-03-02 LAB — GLUCOSE, CAPILLARY
Glucose-Capillary: 143 mg/dL — ABNORMAL HIGH (ref 70–99)
Glucose-Capillary: 148 mg/dL — ABNORMAL HIGH (ref 70–99)
Glucose-Capillary: 131 mg/dL — ABNORMAL HIGH (ref 70–99)

## 2013-03-02 NOTE — Discharge Summary (Signed)
Physician Discharge Summary  Megan Megan [name] MRN: [id number] DOB/AGE: 1927/06/01 79 y.o.  PCP: [name], MD   Admit date: 02/27/2013 Discharge date: 03/02/2013  Discharge Diagnoses:      DM (diabetes mellitus), type 2 with complications   Hypertension   Diastolic dysfunction, left ventricle by ECHO 2011   Hypothyroidism   Acute delirium   Syncope   Hypokalemia   Dementia   Rhabdomyolysis   Dehydration     Medication List    STOP taking these medications       ibuprofen 800 MG tablet  Commonly known as:  ADVIL,MOTRIN      TAKE these medications       bisoprolol-hydrochlorothiazide 5-6.25 MG per tablet  Commonly known as:  ZIAC  Take 1 tablet by mouth daily.               donepezil 10 MG tablet  Commonly known as:  ARICEPT  Take 10 mg by mouth at bedtime.     levothyroxine 50 MCG tablet  Commonly known as:  SYNTHROID, LEVOTHROID  Take 50 mcg by mouth daily.     lisinopril 40 MG tablet  Commonly known as:  PRINIVIL,ZESTRIL  Take 40 mg by mouth daily.     pantoprazole 40 MG tablet  Commonly known as:  PROTONIX  Take 40 mg by mouth daily.     potassium chloride SA 20 MEQ tablet  Commonly known as:  K-DUR,KLOR-CON  Take 20 mEq by mouth 2 (two) times daily.     Travoprost (BAK Free) 0.004 % Soln ophthalmic solution  Commonly known as:  TRAVATAN  Place 1 drop into both eyes at bedtime.        Discharge Condition: *stable   Disposition: snf   Consults:    Significant Diagnostic Studies: Dg Chest 2 View  02/27/2013  *RADIOLOGY REPORT*  Clinical Data: Weakness  CHEST - 2 VIEW  Comparison: 06/04/2012  Findings: The cardiac silhouette is mildly enlarged.  No mediastinal or hilar masses.  The lungs are clear.  The bony thorax is demineralized but intact.  IMPRESSION: No acute cardiopulmonary disease   Original Report Authenticated By: Amie Portland, M.D.    Ct Head Wo Contrast  02/27/2013  *RADIOLOGY REPORT*  Clinical Data:   79 year old Megan [name] with severe headache.  Weakness. Found down.  CT HEAD WITHOUT CONTRAST CT CERVICAL SPINE WITHOUT CONTRAST  Technique:  Multidetector CT imaging of the head and cervical spine was performed following the standard protocol without intravenous contrast.  Multiplanar CT image reconstructions of the cervical spine were also generated.  Comparison:  Brain MRI 04/27/2012 and earlier.  Cervical spine radiographs 01/08/2010.  CT HEAD  Findings: No scalp hematoma identified.  No acute orbit soft tissue findings.  Minimal ethmoid sinus mucosal thickening.  Other Visualized paranasal sinuses and mastoids are clear.  Hyperostosis frontalis.  Osteopenia.  Calvarium intact.  Dural calcifications. Calcified atherosclerosis at the skull base.  Patchy and confluent cerebral white matter hypodensity appears stable.  No ventriculomegaly. No midline shift, mass effect, or evidence of mass lesion.  No evidence of cortically based acute infarction identified.  No acute intracranial hemorrhage identified.  IMPRESSION: 1.  Stable. No acute intracranial abnormality.  No acute traumatic injury identified. 2.  Cervical spine findings are below.  CT CERVICAL SPINE  Findings: Visualized skull base is intact.  No atlanto-occipital dissociation.  2 mm of retrolisthesis of C3 on C4 and similar anterolisthesis of C4 on C5.  Trace anterolisthesis of C7 on  T1. Bilateral posterior element alignment is within normal limits. Multilevel cervical disc and facet degeneration.  Disc space loss and endplate spurring maximal at C3-C4.  No acute cervical fracture identified.  Calcified carotid atherosclerosis in the neck.  Negative visualized lung parenchyma.  Retained secretions in the thoracic esophagus.  Visualized upper thoracic levels remarkable for mild T4 compression fracture, favor chronic.  IMPRESSION: 1. No acute fracture or listhesis identified in the cervical spine. Ligamentous injury is not excluded.  Multilevel degenerative  changes.  2.  Mild T4 compression fracture, appears to be chronic.   Original Report Authenticated By: Erskine Speed, M.D.    Ct Cervical Spine Wo Contrast  02/27/2013  *RADIOLOGY REPORT*  Clinical Data:  79 year old Megan [name] with severe headache.  Weakness. Found down.  CT HEAD WITHOUT CONTRAST CT CERVICAL SPINE WITHOUT CONTRAST  Technique:  Multidetector CT imaging of the head and cervical spine was performed following the standard protocol without intravenous contrast.  Multiplanar CT image reconstructions of the cervical spine were also generated.  Comparison:  Brain MRI 04/27/2012 and earlier.  Cervical spine radiographs 01/08/2010.  CT HEAD  Findings: No scalp hematoma identified.  No acute orbit soft tissue findings.  Minimal ethmoid sinus mucosal thickening.  Other Visualized paranasal sinuses and mastoids are clear.  Hyperostosis frontalis.  Osteopenia.  Calvarium intact.  Dural calcifications. Calcified atherosclerosis at the skull base.  Patchy and confluent cerebral white matter hypodensity appears stable.  No ventriculomegaly. No midline shift, mass effect, or evidence of mass lesion.  No evidence of cortically based acute infarction identified.  No acute intracranial hemorrhage identified.  IMPRESSION: 1.  Stable. No acute intracranial abnormality.  No acute traumatic injury identified. 2.  Cervical spine findings are below.  CT CERVICAL SPINE  Findings: Visualized skull base is intact.  No atlanto-occipital dissociation.  2 mm of retrolisthesis of C3 on C4 and similar anterolisthesis of C4 on C5.  Trace anterolisthesis of C7 on T1. Bilateral posterior element alignment is within normal limits. Multilevel cervical disc and facet degeneration.  Disc space loss and endplate spurring maximal at C3-C4.  No acute cervical fracture identified.  Calcified carotid atherosclerosis in the neck.  Negative visualized lung parenchyma.  Retained secretions in the thoracic esophagus.  Visualized upper thoracic levels  remarkable for mild T4 compression fracture, favor chronic.  IMPRESSION: 1. No acute fracture or listhesis identified in the cervical spine. Ligamentous injury is not excluded.  Multilevel degenerative changes.  2.  Mild T4 compression fracture, appears to be chronic.   Original Report Authenticated By: Erskine Speed, M.D.    Dg Abd Portable 1v  02/27/2013  *RADIOLOGY REPORT*  Clinical Data: Abdominal pain with weakness.  PORTABLE ABDOMEN - 1 VIEW  Comparison: 06/07/2012 and 06/03/2012.  Findings: The bowel gas pattern is normal.  There is no supine evidence of free intraperitoneal air.  Scattered vascular calcifications are noted.  There is curvilinear calcification on the left, corresponding with peripheral calcification within a left renal cyst on prior CT.  There is a moderate convex right lumbar scoliosis with associated spondylosis.  Chronic degenerative changes of the hips and sclerosis adjacent to the right sacroiliac joint are stable.  IMPRESSION: No acute abdominal findings.   Original Report Authenticated By: Carey Bullocks, M.D.      Microbiology: Recent Results (from the past 240 hour(s))  CULTURE, BLOOD (ROUTINE X 2)     Status: None   Collection Time    02/27/13  3:45 PM  Result Value Range Status   Specimen Description BLOOD HAND LEFT   Final   Special Requests BOTTLES DRAWN AEROBIC AND ANAEROBIC 10CC   Final   Culture  Setup Time 02/27/2013 22:35   Final   Culture     Final   Value:        BLOOD CULTURE RECEIVED NO GROWTH TO DATE CULTURE WILL BE HELD FOR 5 DAYS BEFORE ISSUING A FINAL NEGATIVE REPORT   Report Status PENDING   Incomplete  CULTURE, BLOOD (ROUTINE X 2)     Status: None   Collection Time    02/27/13  4:00 PM      Result Value Range Status   Specimen Description BLOOD ARM LEFT   Final   Special Requests BOTTLES DRAWN AEROBIC ONLY 10CC   Final   Culture  Setup Time 02/27/2013 22:35   Final   Culture     Final   Value:        BLOOD CULTURE RECEIVED NO GROWTH TO  DATE CULTURE WILL BE HELD FOR 5 DAYS BEFORE ISSUING A FINAL NEGATIVE REPORT   Report Status PENDING   Incomplete  URINE CULTURE     Status: None   Collection Time    02/27/13  4:07 PM      Result Value Range Status   Specimen Description URINE, RANDOM   Final   Special Requests NONE   Final   Culture  Setup Time 02/26/2013 16:41   Final   Colony Count NO GROWTH   Final   Culture NO GROWTH   Final   Report Status 02/28/2013 FINAL   Final  MRSA PCR SCREENING     Status: None   Collection Time    02/27/13  5:23 PM      Result Value Range Status   MRSA by PCR NEGATIVE  NEGATIVE Final   Comment:            The GeneXpert MRSA Assay (FDA     approved for NASAL specimens     only), is one component of a     comprehensive MRSA colonization     surveillance program. It is not     intended to diagnose MRSA     infection nor to guide or     monitor treatment for     MRSA infections.     Labs: Results for orders placed during the hospital encounter of 02/27/13 (from the past Megan hour(s))  GLUCOSE, CAPILLARY     Status: Abnormal   Collection Time    02/28/13  1:20 PM      Result Value Range   Glucose-Capillary 128 (*) 70 - 99 mg/dL  BASIC METABOLIC PANEL     Status: Abnormal   Collection Time    02/28/13  3:39 PM      Result Value Range   Sodium 135  135 - 145 mEq/L   Potassium 3.4 (*) 3.5 - 5.1 mEq/L   Comment: DELTA CHECK NOTED     NO VISIBLE HEMOLYSIS   Chloride 100  96 - 112 mEq/L   CO2 28  19 - 32 mEq/L   Glucose, Bld 158 (*) 70 - 99 mg/dL   BUN 13  6 - 23 mg/dL   Creatinine, Ser 8.29  0.50 - 1.10 mg/dL   Calcium 9.3  8.4 - 56.2 mg/dL   GFR calc non Af Amer 81 (*) >90 mL/min   GFR calc Af Amer >90  >90 mL/min   Comment:  The eGFR has been calculated     using the CKD EPI equation.     This calculation has not been     validated in all clinical     situations.     eGFR's persistently     <90 mL/min signify     possible Chronic Kidney Disease.  CK      Status: Abnormal   Collection Time    02/28/13  3:39 PM      Result Value Range   Total CK 792 (*) 7 - 177 U/L  HEPATIC FUNCTION PANEL     Status: Abnormal   Collection Time    02/28/13  3:39 PM      Result Value Range   Total Protein 6.5  6.0 - 8.3 g/dL   Albumin 2.7 (*) 3.5 - 5.2 g/dL   AST 35  0 - 37 U/L   ALT 11  0 - 35 U/L   Alkaline Phosphatase 64  39 - 117 U/L   Total Bilirubin 0.9  0.3 - 1.2 mg/dL   Bilirubin, Direct 0.2  0.0 - 0.3 mg/dL   Indirect Bilirubin 0.7  0.3 - 0.9 mg/dL  TSH     Status: Abnormal   Collection Time    02/28/13  3:39 PM      Result Value Range   TSH 5.210 (*) 0.350 - 4.500 uIU/mL  GLUCOSE, CAPILLARY     Status: Abnormal   Collection Time    02/28/13  4:36 PM      Result Value Range   Glucose-Capillary 126 (*) 70 - 99 mg/dL  GLUCOSE, CAPILLARY     Status: Abnormal   Collection Time    02/28/13  8:42 PM      Result Value Range   Glucose-Capillary 128 (*) 70 - 99 mg/dL  GLUCOSE, CAPILLARY     Status: Abnormal   Collection Time    02/28/13 10:31 PM      Result Value Range   Glucose-Capillary 122 (*) 70 - 99 mg/dL  GLUCOSE, CAPILLARY     Status: Abnormal   Collection Time    03/01/13 12:35 AM      Result Value Range   Glucose-Capillary 137 (*) 70 - 99 mg/dL  GLUCOSE, CAPILLARY     Status: Abnormal   Collection Time    03/01/13  2:29 AM      Result Value Range   Glucose-Capillary 134 (*) 70 - 99 mg/dL  CBC     Status: Abnormal   Collection Time    03/01/13  6:35 AM      Result Value Range   WBC 7.1  4.0 - 10.5 K/uL   RBC 4.17  3.87 - 5.11 MIL/uL   Hemoglobin 12.1  12.0 - 15.0 g/dL   HCT 29.5 (*) 62.1 - 30.8 %   MCV 85.9  78.0 - 100.0 fL   MCH 29.0  26.0 - 34.0 pg   MCHC 33.8  30.0 - 36.0 g/dL   RDW 65.7  84.6 - 96.2 %   Platelets 152  150 - 400 K/uL  VITAMIN B12     Status: None   Collection Time    03/01/13  6:35 AM      Result Value Range   Vitamin B-12 433  211 - 911 pg/mL  FOLATE     Status: None   Collection Time     03/01/13  6:35 AM      Result Value Range   Folate >20.0  Comment: (NOTE)     Reference Ranges            Deficient:       0.4 - 3.3 ng/mL            Indeterminate:   3.4 - 5.4 ng/mL            Normal:              > 5.4 ng/mL  IRON AND TIBC     Status: None   Collection Time    03/01/13  6:35 AM      Result Value Range   Iron 56  42 - 135 ug/dL   TIBC 409  811 - 914 ug/dL   Saturation Ratios 22  20 - 55 %   UIBC 202  125 - 400 ug/dL  FERRITIN     Status: None   Collection Time    03/01/13  6:35 AM      Result Value Range   Ferritin 67  10 - 291 ng/mL  RETICULOCYTES     Status: None   Collection Time    03/01/13  6:35 AM      Result Value Range   Retic Ct Pct 0.9  0.4 - 3.1 %   RBC. 4.17  3.87 - 5.11 MIL/uL   Retic Count, Manual 37.5  19.0 - 186.0 K/uL  GLUCOSE, CAPILLARY     Status: Abnormal   Collection Time    03/01/13  7:41 AM      Result Value Range   Glucose-Capillary 137 (*) 70 - 99 mg/dL   Comment 1 Documented in Chart     Comment 2 Notify RN    T4, FREE     Status: None   Collection Time    03/01/13  8:17 AM      Result Value Range   Free T4 1.41  0.80 - 1.80 ng/dL  MAGNESIUM     Status: None   Collection Time    03/01/13  8:17 AM      Result Value Range   Magnesium 1.9  1.5 - 2.5 mg/dL  GLUCOSE, CAPILLARY     Status: Abnormal   Collection Time    03/01/13 11:25 AM      Result Value Range   Glucose-Capillary 122 (*) 70 - 99 mg/dL   Comment 1 Documented in Chart     Comment 2 Notify RN    GLUCOSE, CAPILLARY     Status: Abnormal   Collection Time    03/01/13  4:38 PM      Result Value Range   Glucose-Capillary 170 (*) 70 - 99 mg/dL   Comment 1 Documented in Chart     Comment 2 Notify RN    GLUCOSE, CAPILLARY     Status: Abnormal   Collection Time    03/01/13  9:15 PM      Result Value Range   Glucose-Capillary 174 (*) 70 - 99 mg/dL  BASIC METABOLIC PANEL     Status: Abnormal   Collection Time    03/02/13  5:46 AM      Result Value Range    Sodium 141  135 - 145 mEq/L   Potassium 3.7  3.5 - 5.1 mEq/L   Chloride 105  96 - 112 mEq/L   CO2 27  19 - 32 mEq/L   Glucose, Bld 146 (*) 70 - 99 mg/dL   BUN 10  6 - 23 mg/dL   Creatinine, Ser 7.82  0.50 -  1.10 mg/dL   Calcium 9.7  8.4 - 16.1 mg/dL   GFR calc non Af Amer 84 (*) >90 mL/min   GFR calc Af Amer >90  >90 mL/min   Comment:            The eGFR has been calculated     using the CKD EPI equation.     This calculation has not been     validated in all clinical     situations.     eGFR's persistently     <90 mL/min signify     possible Chronic Kidney Disease.  CK     Status: Abnormal   Collection Time    03/02/13  5:46 AM      Result Value Range   Total CK 306 (*) 7 - 177 U/L  GLUCOSE, CAPILLARY     Status: Abnormal   Collection Time    03/02/13  8:02 AM      Result Value Range   Glucose-Capillary 143 (*) 70 - 99 mg/dL     Brief narrative:  79 year old Megan [name] with dementia who lives at home with her husband. She was found down at home by the caregiver who arrived to look after the patient. Patient had no recollection of these events. No family was available at the bedside for the admitting physician to interview. Apparently attempts made by ED physician as well as the admitting physician to contact the patient's husband and son were unsuccessful given no one had answered at the phone numbers provided. The caregiver was able to report that the patient was found face down in the bathroom when the same close she was wearing the night before and it was surmised the patient had been on the bathroom floor all night. In the emergency department she was found to be dehydrated with lactic acidosis and an elevated CPK consistent with rhabdomyolysis. CT of the head showed no acute process. Her EKG was nonischemic and cardiac enzymes were within normal limits except for the elevated CPK.    Assessment/Plan:  Active Problems:  Acute delirium/Dementia  -pt is an EXTREMELY poor  historian and has no recollection of events preceding admission  Social work consult for SNF  -resume Aricept  -was on Seroquel extended release and Tranxene at home-? too sedating and  Tranxene has been discontinued    Syncope  -unclear if mechanical fall vs cardiac arrythmia in the setting of low potassium vs vasovagal syncope  -telemetry unrevealing since admit  -leaning toward mechanical fall vs orthostasis/was on several anti-HTN meds at home including low dose HCTZ  -PT/OT eval and check OVS  -pt was on diflucan and Levaquin at home so could have had NSVT-but Qtc was normal so doubt  -EEG pending/no apparent seizure activity since admit    Rhabdomyolysis  -initial CPK >700- improving    DM (diabetes mellitus), type 2 with complications  -not on meds pre admit  -CBG <150    Hypertension  -cont home meds    Hypokalemia  repleated , recheck BMP in one week  Dehydration  -cont IVF    Thrombocytopenia/mild leukocytosis/borderline anemia   initial platelets were 136, now >150 Hg around 12 which appears to be her baseline ,  Repeat CBC in one week   Diastolic dysfunction, left ventricle by ECHO 2011  -compensated    Hypothyroidism  - TSH 5.21, check a free T4  -cont Synthroid        Discharge Exam:  Blood pressure 154/59, pulse  68, temperature 98.7 F (37.1 C), temperature source Oral, resp. rate 18, height 5\' 5"  (1.651 m), weight 54.999 kg (121 lb 4 oz), SpO2 99.00%.   General: No acute respiratory distress  Lungs: Clear to auscultation bilaterally without wheezes or crackles, RA  Cardiovascular: Regular rate and rhythm without murmur gallop or rub normal S1 and S2, no peripheral edema or JVD  Abdomen: Nontender, nondistended, soft, bowel sounds positive, no rebound, no ascites, no appreciable mass  Musculoskeletal: No significant cyanosis, clubbing of bilateral lower extremities  Neurological: Alert and oriented x name and questionable to play, moves  all extremities x 4 without focal neurological deficits, CN 2-12 intact       Signed: Anup Brigham 03/02/2013, 10:50 AM

## 2013-03-02 NOTE — Progress Notes (Signed)
OT Cancellation Note  Patient Details Name: CHARESE ABUNDIS MRN: [id number] DOB: [birth year]   Cancelled Treatment:    Reason Eval/Treat Not Completed: Other (comment) (transportation arrangements made for d/c snf this pm) Note: cm has made arrangements for d/c this pm to snf.  Robet Leu 03/02/2013, 3:34 PM

## 2013-03-02 NOTE — Progress Notes (Signed)
Physical Therapy Treatment Patient Details Name: Megan [name] MRN: [id number] DOB: 09-30-1927 Today's Date: 03/02/2013 Time: 0920-0936 PT Time Calculation (min): 16 min  PT Assessment / Plan / Recommendation Comments on Treatment Session  Pt mobility and participation improved    Follow Up Recommendations  SNF;Supervision/Assistance - 24 hour     Does the patient have the potential to tolerate intense rehabilitation     Barriers to Discharge        Equipment Recommendations  None recommended by PT    Recommendations for Other Services    Frequency Min 3X/week   Plan Discharge plan remains appropriate;Frequency remains appropriate    Precautions / Restrictions Precautions Precautions: Fall Precaution Comments: confusion/dementia Restrictions Weight Bearing Restrictions: No   Pertinent Vitals/Pain No pain.     Mobility  Bed Mobility Bed Mobility: Supine to Sit;Sitting - Scoot to Edge of Bed Supine to Sit: 3: Mod assist;HOB elevated Sitting - Scoot to Edge of Bed: 3: Mod assist;With rail Details for Bed Mobility Assistance: Manual facilitaiton to scoot hips forward to EOB. Transfers Transfers: Sit to Stand;Stand to Sit;Stand Pivot Transfers Sit to Stand: 3: Mod assist;With upper extremity assist;From bed Sit to Stand: Patient Percentage: 60% Stand to Sit: 3: Mod assist;With upper extremity assist;To chair/3-in-1 Stand to Sit: Patient Percentage: 60% Stand Pivot Transfers: 3: Mod assist Details for Transfer Assistance: Assist to initiate forward wt shift.  Assist to extend LEs pt unable to stand fully erect.   Ambulation/Gait Ambulation/Gait Assistance: Not tested (comment)    Exercises     PT Diagnosis:    PT Problem List:   PT Treatment Interventions:     PT Goals Acute Rehab PT Goals PT Goal Formulation: Patient unable to participate in goal setting Time For Goal Achievement: 03/14/13 Potential to Achieve Goals: Good Pt will go Supine/Side to Sit: with  supervision;with HOB 0 degrees PT Goal: Supine/Side to Sit - Progress: Progressing toward goal Pt will Sit at [facility] of Bed: with supervision;3-5 min;with unilateral upper extremity support PT Goal: Sit at Edge Of Bed - Progress: Progressing toward goal Pt will go Sit to Stand: with min assist;with upper extremity assist PT Goal: Sit to Stand - Progress: Progressing toward goal Pt will Transfer Bed to Chair/Chair to Bed: with min assist PT Transfer Goal: Bed to Chair/Chair to Bed - Progress: Progressing toward goal Pt will Ambulate: 16 - 50 feet;with min assist;with rolling walker  Visit Information  Last PT Received On: 03/02/13 Assistance Needed: +2    Subjective Data  Subjective: Pt appears to be less confused today   Cognition  Cognition Arousal/Alertness: Awake/alert Behavior During Therapy: WFL for tasks assessed/performed Overall Cognitive Status: Impaired/Different from baseline Orientation Level: Disoriented to;Situation;Time Current Attention Level: Focused Memory: Decreased short-term memory    Balance  Balance Balance Assessed: Yes  End of Session PT - End of Session Equipment Utilized During Treatment: Gait belt Activity Tolerance: Patient limited by fatigue Patient left: in chair;with call bell/phone within reach Nurse Communication: Mobility status   GP     Giovanna Kemmerer 03/02/2013, 11:30 AM Theron Arista L. Derrico Zhong DPT [phone]

## 2013-03-02 NOTE — Progress Notes (Signed)
Called [facility] to give report on patient.  Spoke with Gustavo Lah, RN.  Answered all questions to receiving nurse's satisfaction.  Transportation being requested by social work.

## 2013-03-03 ENCOUNTER — Non-Acute Institutional Stay (SKILLED_NURSING_FACILITY): Payer: Medicare Other | Admitting: Internal Medicine

## 2013-03-03 DIAGNOSIS — E118 Type 2 diabetes mellitus with unspecified complications: Secondary | ICD-10-CM

## 2013-03-03 DIAGNOSIS — E860 Dehydration: Secondary | ICD-10-CM

## 2013-03-03 DIAGNOSIS — E039 Hypothyroidism, unspecified: Secondary | ICD-10-CM

## 2013-03-03 DIAGNOSIS — I10 Essential (primary) hypertension: Secondary | ICD-10-CM

## 2013-03-03 DIAGNOSIS — R55 Syncope and collapse: Secondary | ICD-10-CM

## 2013-03-03 DIAGNOSIS — F0390 Unspecified dementia without behavioral disturbance: Secondary | ICD-10-CM

## 2013-03-03 NOTE — Progress Notes (Signed)
Patient ID: MALIEA GRANDMAISON, female   DOB: 07-12-1927, 79 y.o.   MRN: [id number]  History and Physical  TRUDE CANSLER NWG:[id number] DOB: 1926/12/22   Date of admission: 03/02/2013  Location: [facility]   PCP: [name], MD   Chief Complaint: post hospitalization SNF follow up  History of presenting illness: 79 y/o female with HTN, DM , diastolic dysfunction, hypothyroidism who was admitted to [facility] from 4/28-03/02/2013 with syncope and found to have rhabdomyolysis. She was also delirious with worsened underlying dementia. She was noted to be dehydrated with lactic acidosis on presentation. Cause of her syncope was unclear for mechanical fall vs arrythmia. Head CT was negative. Patient monitored on telemetry and given IV fluids for hydration. EEG was was unremarkable. Orthostasis was negative although this was concerning given patient on multiple blood pressure medications. Patient showed clinical improvement in her symptoms and her mental status began to improve. She was evaluated by PT/OT and recommended for skilled nursing facility for need of physical therapy and strength training.  Review of Systems:  Review of systems Limited due to patient's dementia.  Constitutional: Denies fever, chills, diaphoresis, appetite change and fatigue.  HEENT: Denies  eye pain, hearing loss, ear pain, congestion, sore throat, rhinorrhea, trouble swallowing, neck pain  and tinnitus.   Respiratory: Denies SOB, DOE, cough, chest tightness,  and wheezing.   Cardiovascular: Denies chest pain, palpitations and leg swelling.  Gastrointestinal: Denies nausea, vomiting, abdominal pain, diarrhea, constipation, blood in stool and abdominal distention.  Genitourinary: Denies dysuria, urgency, frequency, hematuria, flank pain and difficulty urinating.  Musculoskeletal: Denies myalgias, back pain, joint swelling, arthralgias.  Neurological: Denies dizziness,  weakness, light-headedness,  numbness and headaches.     Past Medical History  Diagnosis Date  . Hypertension   . Diastolic dysfunction, left ventricle by ECHO 2011 03/25/2012  . Hypothyroidism 03/25/2012  . High cholesterol   . Type II diabetes mellitus   . H/O hiatal hernia   . Duodenal perforation 03/23/12  . Arthritis     "in my back"   Past Surgical History  Procedure Laterality Date  . Bladder tacked    . Repair perforated ulcer  03/23/12  . Diagnostic laparoscopy  03/25/12    REPAIR OF PERFORATED ULCER with omental patch  . Abcess drainage  03/25/12     of abdominal & pelvic abscesses  . Laparoscopy  03/24/2012    Procedure: LAPAROSCOPY DIAGNOSTIC;  Surgeon: Ardeth Sportsman, MD;  Location: [facility] OR;  Service: General;  Laterality: N/A;  drainage of abdominal and pelvic abcesses  . Laparotomy  06/03/2012    Procedure: EXPLORATORY LAPAROTOMY;  Surgeon: Mariella Saa, MD;  Location: [facility] OR;  Service: General;  Laterality: N/A;   Social History:  reports that she has never smoked. Her smokeless tobacco use includes Snuff. She reports that she does not drink alcohol or use illicit drugs.  No Known Allergies  No family history on file.  Prior to Admission medications   Medication Sig Start Date End Date Taking? Authorizing Provider  bisoprolol-hydrochlorothiazide (ZIAC) 5-6.25 MG per tablet Take 1 tablet by mouth daily.    Historical Provider, MD  donepezil (ARICEPT) 10 MG tablet Take 10 mg by mouth at bedtime.    Historical Provider, MD  levothyroxine (SYNTHROID, LEVOTHROID) 50 MCG tablet Take 50 mcg by mouth daily.    Historical Provider, MD  lisinopril (PRINIVIL,ZESTRIL) 40 MG tablet Take 40 mg by mouth daily.    Historical Provider, MD  pantoprazole (PROTONIX) 40  MG tablet Take 40 mg by mouth daily.    Kathlen Mody, MD  potassium chloride SA (K-DUR,KLOR-CON) 20 MEQ tablet Take 20 mEq by mouth 2 (two) times daily.    Historical Provider, MD  Travoprost, BAK Free, (TRAVATAN) 0.004 % SOLN ophthalmic  solution Place 1 drop into both eyes at bedtime.    Historical Provider, MD    Physical Exam:  Filed Vitals:   03/03/13 1651  BP: 148/64  Pulse: 60  Temp: 98.6 F (37 C)  Resp: 18  SpO2: 97%    Constitutional: Vital signs reviewed.  Patient is an elderly African [place]  female in NAD Head: Normocephalic and atraumatic Mouth: no erythema or exudates, MMM Eyes: PERRL, EOMI, conjunctivae normal, No scleral icterus.  Neck: Supple, Trachea midline normal ROM, No JVD, mass, thyromegaly, or carotid bruit present.  Cardiovascular: RRR, S1 normal, S2 normal, no MRG, pulses symmetric and intact bilaterally Pulmonary/Chest: CTAB, no wheezes, rales, or rhonchi Abdominal: Soft. Non-tender, non-distended, bowel sounds are normal, no masses, organomegaly, or guarding present.  GU: no CVA tenderness Musculoskeletal: No joint deformities, erythema, or stiffness, ROM full and no nontender Ext: no edema and no cyanosis, pulses palpable bilaterally  Hematology: no cervical  adenopathy.  Neurological: A&O x2, Strenght is normal and symmetric bilaterally, cranial nerve II-XII are grossly intact, no focal motor deficit, sensory intact to light touch bilaterally.  Skin: Warm, dry and intact. No rash, cyanosis, or clubbing.  Psychiatric: Normal mood and affect. speech and behavior is normal. Poor Judgment and thought content . Cognition and memory slightly impaired.   Labs on Admission:  Basic Metabolic Panel:  Recent Labs Lab 02/27/13 1244 02/27/13 1631 02/28/13 0515 02/28/13 1539 03/01/13 0817 03/02/13 0546  NA 138 141 138 135  --  141  K 4.1 2.8* 2.7* 3.4*  --  3.7  CL 96 100 101 100  --  105  CO2 24  --  29 28  --  27  GLUCOSE 148* 143* 120* 158*  --  146*  BUN 10 9 12 13   --  10  CREATININE 0.48* 0.60 0.62 0.63  --  0.55  CALCIUM 10.4  --  9.2 9.3  --  9.7  MG  --   --  1.9  --  1.9  --    Liver Function Tests:  Recent Labs Lab 02/27/13 1244 02/28/13 1539  AST 49* 35  ALT 13  11  ALKPHOS 75 64  BILITOT 0.9 0.9  PROT 8.1 6.5  ALBUMIN 3.4* 2.7*   No results found for this basename: LIPASE, AMYLASE,  in the last 168 hours No results found for this basename: AMMONIA,  in the last 168 hours CBC:  Recent Labs Lab 02/27/13 1244 02/27/13 1631 02/28/13 0515 03/01/13 0635  WBC 10.6*  --  8.8 7.1  NEUTROABS 8.9*  --   --   --   HGB 16.4* 15.3* 12.6 12.1  HCT 47.3* 45.0 36.9 35.8*  MCV 85.7  --  85.2 85.9  PLT 136*  --  148* 152   Cardiac Enzymes:  Recent Labs Lab 02/27/13 1243 02/28/13 1539 03/02/13 0546  CKTOTAL 795* 792* 306*   BNP: No components found with this basename: POCBNP,  CBG:  Recent Labs Lab 03/01/13 1638 03/01/13 2115 03/02/13 0802 03/02/13 1131 03/02/13 1632  GLUCAP 170* 174* 143* 148* 131*      Assessment/Plan   Dehydration and syncope Associated with rhabdomyolysis which has resolved. Needs PT for gait training , strength and transfer.  Syncope w/up in the hospital negative.   HTN BP stable . Resumed home meds including bisoprolol-HCTZ and lisinopril. Amlodipine discontinued.    Type II Diabetes mellitus Was on amaryl prior to hospitalization.. fsg stable and not on any meds upon discharge. Will resume meds if fsg elevated.   Hypothyroidism Elevated TSH . Increase synthroid dose to 75 mcg.   GERD continue protonix  dementia Moderate. continue aricept. Patient was on seroquel which was discontinued in the hospital.   Future labs:  CBC and BMET in 1 week   Patient will be followed up as needed in SNF      Activities of daily living: feeds independently, needs assist with all other activities. Ambulates short distances with a walker.   Rehab potential : fair    Code Status: FULL CODE   Chance Munter  03/03/2013, 4:53 PM    Total time spent: 60 minutes

## 2013-03-05 LAB — CULTURE, BLOOD (ROUTINE X 2): Culture: NO GROWTH

## 2013-03-23 ENCOUNTER — Encounter: Payer: Self-pay | Admitting: Nurse Practitioner

## 2013-03-23 ENCOUNTER — Non-Acute Institutional Stay (SKILLED_NURSING_FACILITY): Payer: Medicare Other | Admitting: Nurse Practitioner

## 2013-03-23 DIAGNOSIS — F0391 Unspecified dementia with behavioral disturbance: Secondary | ICD-10-CM

## 2013-03-23 DIAGNOSIS — E118 Type 2 diabetes mellitus with unspecified complications: Secondary | ICD-10-CM

## 2013-03-23 DIAGNOSIS — R5381 Other malaise: Secondary | ICD-10-CM

## 2013-03-23 DIAGNOSIS — I10 Essential (primary) hypertension: Secondary | ICD-10-CM

## 2013-03-23 NOTE — Progress Notes (Signed)
Patient ID: Megan [name], female   DOB: 1927-07-03, 79 y.o.   MRN: [id number]  Nursing Home Location:  [facility]   Place of Service: SNF (31)   Chief Complaint: evaluation for discharge   HPI:  79 y/o female with HTN, DM , diastolic dysfunction, hypothyroidism who was admitted to [facility] [place] from 4/28-03/02/2013 with syncope and found to have rhabdomyolysis. She was noted to be dehydrated with lactic acidosis on presentation. Cause of her syncope was unclear for mechanical fall vs arrythmia. Head CT was negative. Patient monitored on telemetry and given IV fluids for hydration. EEG was was unremarkable. Orthostasis was negative although this was concerning given patient on multiple blood pressure medications.  She was evaluated by PT/OT and recommended for skilled nursing facility for need of physical therapy and strength training. Family is now ready to take her home with home health and family support.    Review of Systems:  Review of Systems  Constitutional: Negative for fever, chills and malaise/fatigue.  Respiratory: Negative for cough and shortness of breath.   Cardiovascular: Negative for chest pain.  Gastrointestinal: Negative for abdominal pain, diarrhea and constipation.  Genitourinary: Negative for dysuria.  Musculoskeletal: Negative for myalgias.  Skin: Negative.   Neurological: Negative for weakness and headaches.  Psychiatric/Behavioral: Positive for memory loss. Negative for depression.     Medications: Patient's Medications  New Prescriptions   No medications on file  Previous Medications   BISOPROLOL-HYDROCHLOROTHIAZIDE (ZIAC) 5-6.25 MG PER TABLET    Take 1 tablet by mouth daily.   DONEPEZIL (ARICEPT) 10 MG TABLET    Take 10 mg by mouth at bedtime.   LEVOTHYROXINE (SYNTHROID, LEVOTHROID) 50 MCG TABLET    Take 50 mcg by mouth daily.   LISINOPRIL (PRINIVIL,ZESTRIL) 40 MG TABLET    Take 40 mg by mouth daily.   PANTOPRAZOLE (PROTONIX) 40 MG  TABLET    Take 40 mg by mouth daily.   POTASSIUM CHLORIDE SA (K-DUR,KLOR-CON) 20 MEQ TABLET    Take 20 mEq by mouth 2 (two) times daily.   TRAVOPROST, BAK FREE, (TRAVATAN) 0.004 % SOLN OPHTHALMIC SOLUTION    Place 1 drop into both eyes at bedtime.  Modified Medications   No medications on file  Discontinued Medications   No medications on file     Physical Exam:  Filed Vitals:   03/23/13 1626  BP: 155/63  Pulse: 77  Temp: 96.9 F (36.1 C)  Resp: 20    Physical Exam  Constitutional: No distress.  HENT:  Head: Normocephalic and atraumatic.  Mouth/Throat: Oropharynx is clear and moist.  Eyes: EOM are normal. Pupils are equal, round, and reactive to light.  Neck: Normal range of motion. Neck supple.  Pulmonary/Chest: Effort normal and breath sounds normal.  Musculoskeletal: She exhibits no edema and no tenderness.  Currently in wheelchair. Can move all extremities but is with generalized weakness.   Neurological: She is alert.  Skin: Skin is warm and dry. She is not diaphoretic.      Labs reviewed: CMP with Estimated GFR       Result: 03/16/2013 4:07 PM    ( Status: F )            Sodium  137        135-145  mEq/L  SLN       Potassium  4.2        3.5-5.3  mEq/L  SLN       Chloride  104  96-112  mEq/L  SLN       CO2  28        19-32  mEq/L  SLN       Glucose  125     H  70-99  mg/dL  SLN       BUN  13        6-23  mg/dL  SLN       Creatinine  0.70        0.50-1.10  mg/dL  SLN       Bilirubin, Total  0.5        0.3-1.2  mg/dL  SLN       Alkaline Phosphatase  86        39-117  U/L  SLN       AST/SGOT  13        0-37  U/L  SLN       ALT/SGPT  8        0-35  U/L  SLN       Total Protein  5.7     L  6.0-8.3  g/dL  SLN       Albumin  3.0     L  3.5-5.2  g/dL  SLN       Calcium  9.3        8.4-10.5  mg/dL  SLN       Est GFR, African American  >89         mL/min  SLN       Est GFR, NonAfrican American  80         mL/min  SLN  C    CBC NO Diff (Complete Blood Count)        Result: 03/16/2013 2:33 PM    ( Status: F )            WBC  6.0        4.0-10.5  K/uL  SLN       RBC  4.15        3.87-5.11  MIL/uL  SLN       Hemoglobin  12.4        12.0-15.0  g/dL  SLN       Hematocrit  35.3     L  36.0-46.0  %  SLN       MCV  85.1        78.0-100.0  fL  SLN       MCH  29.9        26.0-34.0  pg  SLN       MCHC  35.1        30.0-36.0  g/dL  SLN       RDW  91.4        11.5-15.5  %  SLN       Platelet Count  204         Assessment/Plan Dementia Stable to return home with assistance.  DM (diabetes mellitus), type 2 with complications Patient is stable; continue current regimen.   Hypertension Patient is stable; continue current regimen.   Debility pt is stable for discharge-will need to be evaluated and treated per home health therapy. Will also consult SW. No DME needed. Rx written.  will need to follow up with PCP within 2 weeks.

## 2013-03-30 NOTE — Assessment & Plan Note (Signed)
Patient is stable; continue current regimen. 

## 2013-03-30 NOTE — Assessment & Plan Note (Signed)
pt is stable for discharge-will need to be evaluated and treated per home health therapy. Will also consult SW. No DME needed. Rx written.  will need to follow up with PCP within 2 weeks.

## 2013-03-30 NOTE — Assessment & Plan Note (Signed)
Stable to return home with assistance.

## 2013-07-06 IMAGING — CR DG ABDOMEN 2V
2 series · 2 of 2 positions shown · non-contrast
Comparison: CT abdomen pelvis 12/30/2008.

CLINICAL DATA: Abdominal pain and tenderness.

ABDOMEN - 2 VIEW

[t abdomen supine]
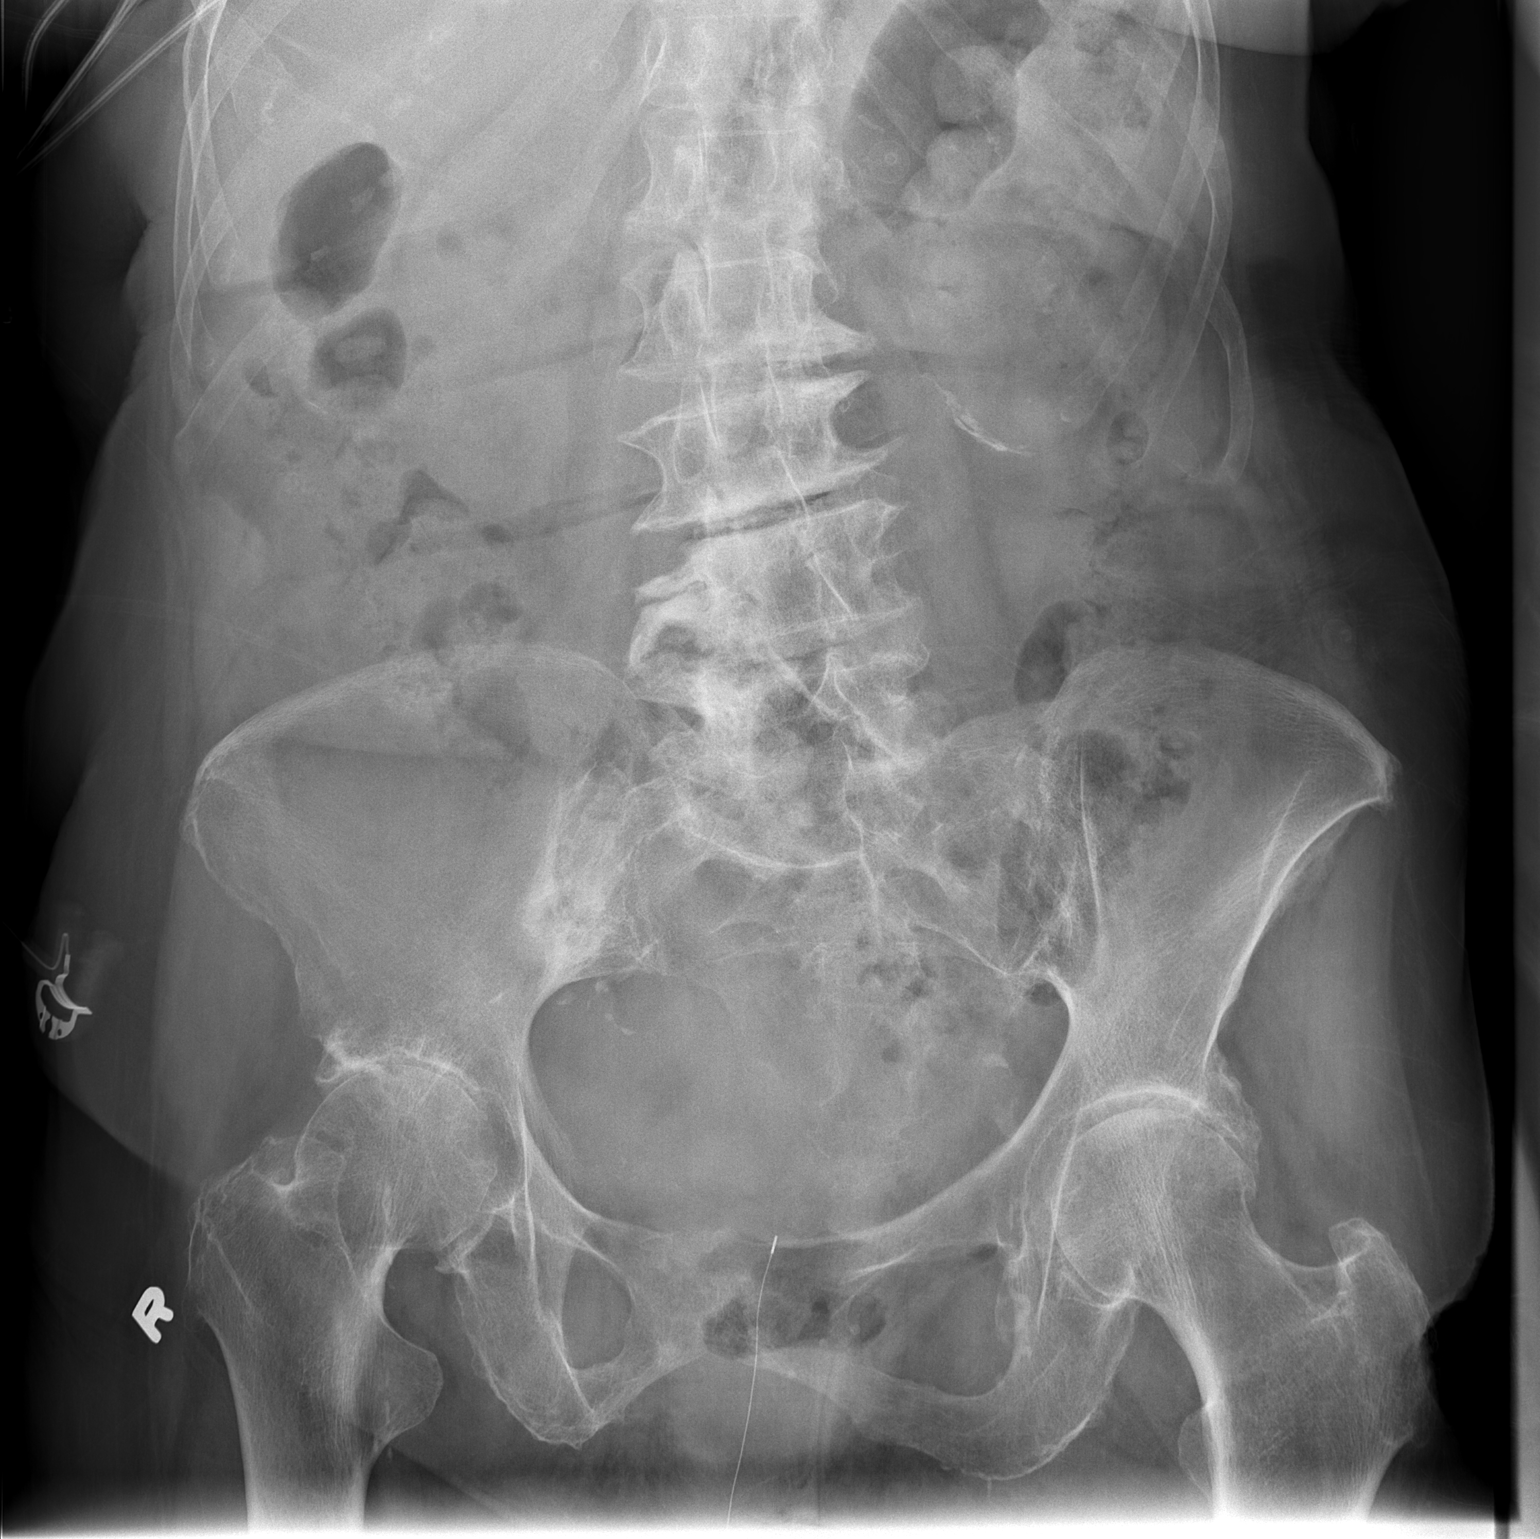

[w abdomen decub]
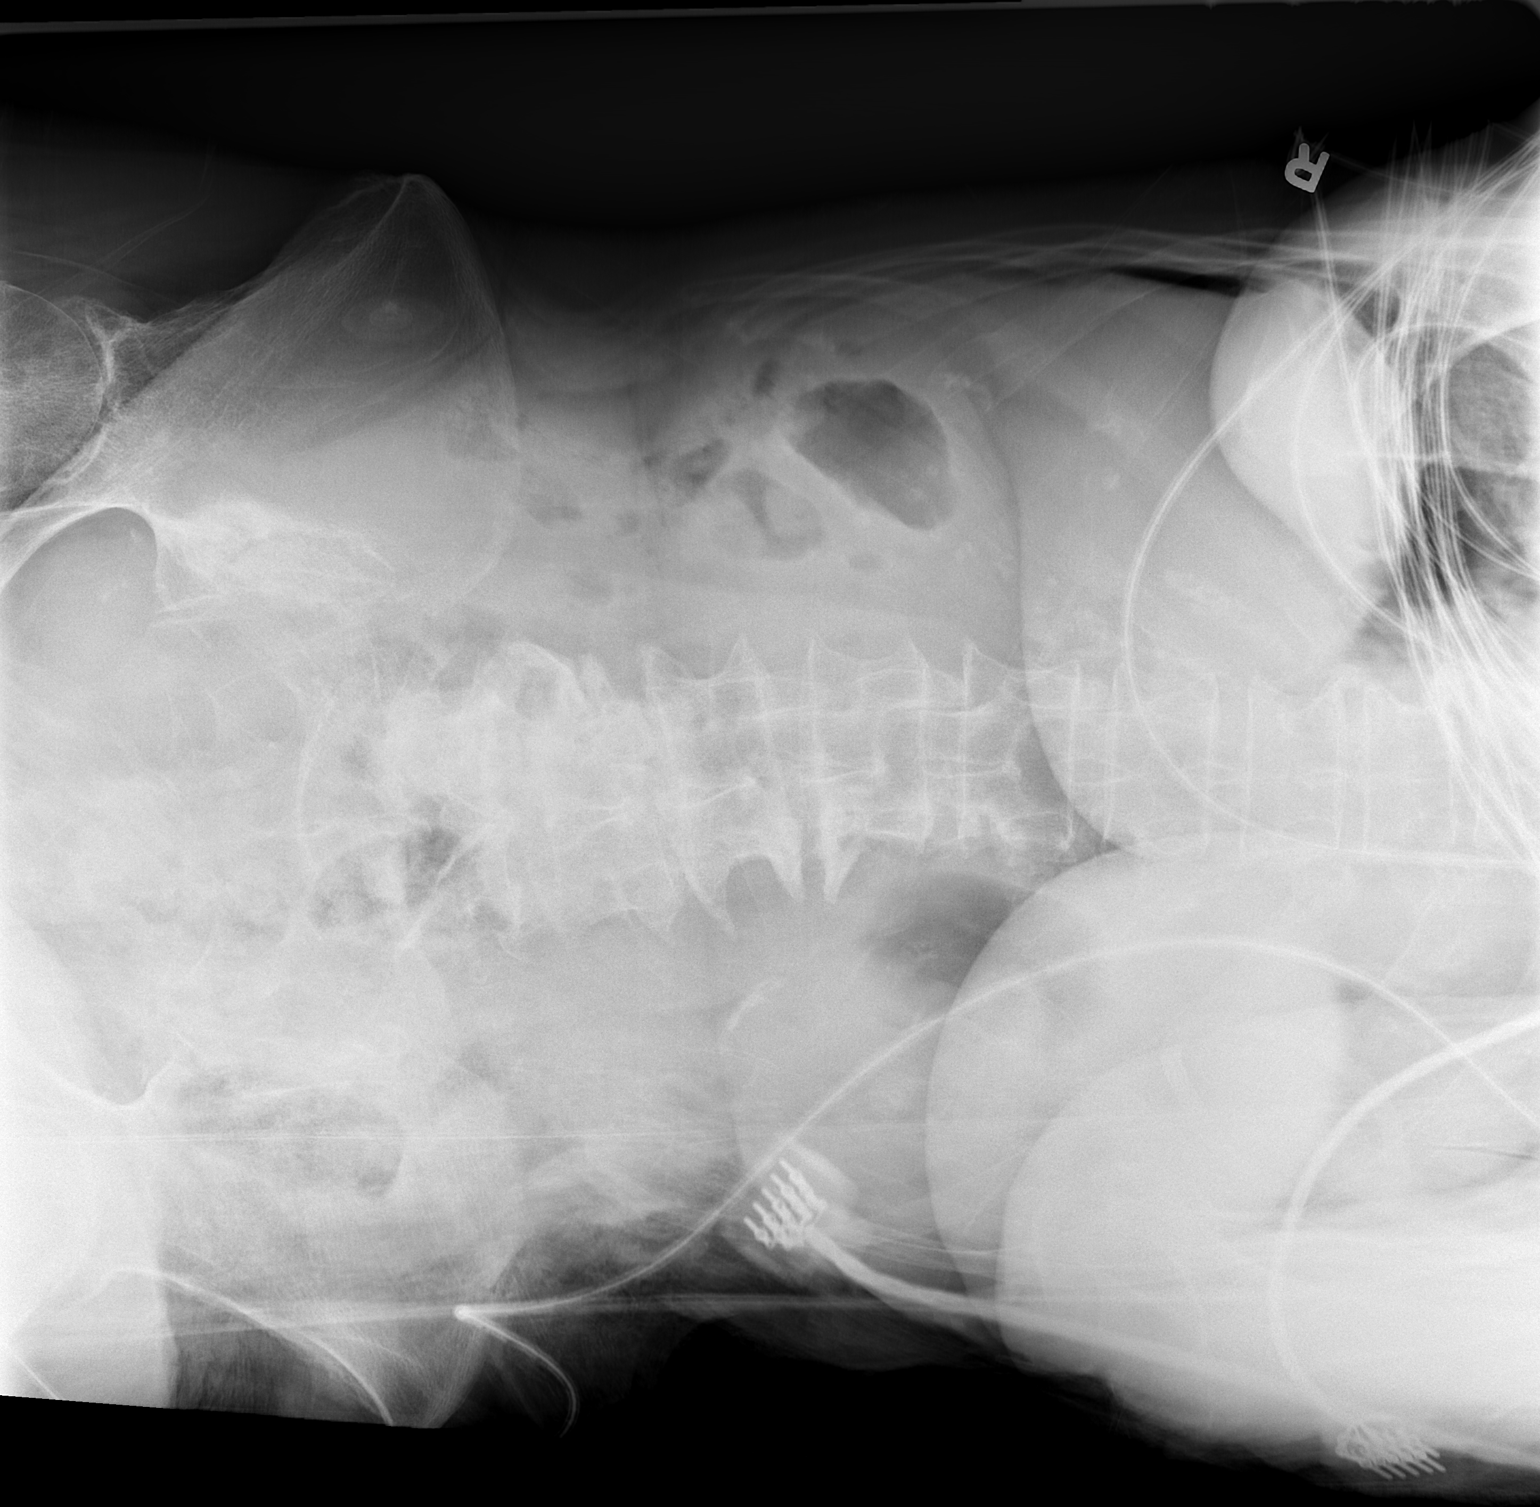

[2 of 2 positions shown; findings below may reference images not displayed]

FINDINGS: Bowel gas pattern unremarkable without evidence of
obstruction or significant ileus.  Free intraperitoneal air
identified adjacent to the liver on the lateral decubitus view.
Aorto-iliac atherosclerosis.  No visible opaque urinary tract
calculi.  Osteopenia, severe degenerative changes involving the
lumbar spine, and severe degenerative changes involving the right
sacroiliac joint and both hips.
IMPRESSION: Pneumoperitoneum.

Critical Value/emergent results were called by telephone at the
time of interpretation on 03/24/2012  at 1655 hours  to  Dr. Jackelin
of the emergency department, who verbally acknowledged these
results.

## 2013-07-17 ENCOUNTER — Emergency Department (HOSPITAL_COMMUNITY): Payer: Medicare Other

## 2013-07-17 ENCOUNTER — Inpatient Hospital Stay (HOSPITAL_COMMUNITY)
Admission: EM | Admit: 2013-07-17 | Discharge: 2013-07-18 | DRG: 389 | Disposition: A | Discharge status: Home / Self Care | Payer: Medicare Other | Attending: Internal Medicine | Admitting: Internal Medicine

## 2013-07-17 ENCOUNTER — Encounter (HOSPITAL_COMMUNITY): Payer: Self-pay

## 2013-07-17 DIAGNOSIS — R410 Disorientation, unspecified: Secondary | ICD-10-CM

## 2013-07-17 DIAGNOSIS — R55 Syncope and collapse: Secondary | ICD-10-CM

## 2013-07-17 DIAGNOSIS — N179 Acute kidney failure, unspecified: Secondary | ICD-10-CM

## 2013-07-17 DIAGNOSIS — R1031 Right lower quadrant pain: Secondary | ICD-10-CM

## 2013-07-17 DIAGNOSIS — E1129 Type 2 diabetes mellitus with other diabetic kidney complication: Secondary | ICD-10-CM | POA: Diagnosis present

## 2013-07-17 DIAGNOSIS — E118 Type 2 diabetes mellitus with unspecified complications: Secondary | ICD-10-CM | POA: Diagnosis present

## 2013-07-17 DIAGNOSIS — R109 Unspecified abdominal pain: Secondary | ICD-10-CM

## 2013-07-17 DIAGNOSIS — K562 Volvulus: Principal | ICD-10-CM | POA: Diagnosis present

## 2013-07-17 DIAGNOSIS — R001 Bradycardia, unspecified: Secondary | ICD-10-CM

## 2013-07-17 DIAGNOSIS — E785 Hyperlipidemia, unspecified: Secondary | ICD-10-CM | POA: Diagnosis present

## 2013-07-17 DIAGNOSIS — K659 Peritonitis, unspecified: Secondary | ICD-10-CM

## 2013-07-17 DIAGNOSIS — I10 Essential (primary) hypertension: Secondary | ICD-10-CM

## 2013-07-17 DIAGNOSIS — M6282 Rhabdomyolysis: Secondary | ICD-10-CM

## 2013-07-17 DIAGNOSIS — Z79899 Other long term (current) drug therapy: Secondary | ICD-10-CM

## 2013-07-17 DIAGNOSIS — M1990 Unspecified osteoarthritis, unspecified site: Secondary | ICD-10-CM

## 2013-07-17 DIAGNOSIS — I519 Heart disease, unspecified: Secondary | ICD-10-CM | POA: Diagnosis present

## 2013-07-17 DIAGNOSIS — I509 Heart failure, unspecified: Secondary | ICD-10-CM | POA: Diagnosis present

## 2013-07-17 DIAGNOSIS — R112 Nausea with vomiting, unspecified: Secondary | ICD-10-CM

## 2013-07-17 DIAGNOSIS — J9600 Acute respiratory failure, unspecified whether with hypoxia or hypercapnia: Secondary | ICD-10-CM

## 2013-07-17 DIAGNOSIS — F0390 Unspecified dementia without behavioral disturbance: Secondary | ICD-10-CM | POA: Diagnosis present

## 2013-07-17 DIAGNOSIS — E7800 Pure hypercholesterolemia, unspecified: Secondary | ICD-10-CM | POA: Diagnosis present

## 2013-07-17 DIAGNOSIS — R1032 Left lower quadrant pain: Secondary | ICD-10-CM

## 2013-07-17 DIAGNOSIS — R9431 Abnormal electrocardiogram [ECG] [EKG]: Secondary | ICD-10-CM

## 2013-07-17 DIAGNOSIS — R5381 Other malaise: Secondary | ICD-10-CM | POA: Diagnosis present

## 2013-07-17 DIAGNOSIS — K56609 Unspecified intestinal obstruction, unspecified as to partial versus complete obstruction: Secondary | ICD-10-CM

## 2013-07-17 DIAGNOSIS — I5030 Unspecified diastolic (congestive) heart failure: Secondary | ICD-10-CM | POA: Diagnosis present

## 2013-07-17 DIAGNOSIS — N281 Cyst of kidney, acquired: Secondary | ICD-10-CM | POA: Diagnosis present

## 2013-07-17 DIAGNOSIS — I498 Other specified cardiac arrhythmias: Secondary | ICD-10-CM | POA: Diagnosis present

## 2013-07-17 DIAGNOSIS — F0150 Vascular dementia without behavioral disturbance: Secondary | ICD-10-CM | POA: Diagnosis present

## 2013-07-17 DIAGNOSIS — E860 Dehydration: Secondary | ICD-10-CM

## 2013-07-17 DIAGNOSIS — E039 Hypothyroidism, unspecified: Secondary | ICD-10-CM | POA: Diagnosis present

## 2013-07-17 DIAGNOSIS — E876 Hypokalemia: Secondary | ICD-10-CM

## 2013-07-17 LAB — CREATININE, SERUM
Creatinine, Ser: 0.84 mg/dL (ref 0.50–1.10)
GFR calc Af Amer: 72 mL/min — ABNORMAL LOW (ref >90)
GFR calc non Af Amer: 62 mL/min — ABNORMAL LOW (ref >90)

## 2013-07-17 LAB — URINALYSIS, ROUTINE W REFLEX MICROSCOPIC
Leukocytes, UA: NEGATIVE
Nitrite: NEGATIVE
Protein, ur: NEGATIVE mg/dL
Specific Gravity, Urine: 1.019 (ref 1.005–1.030)
Urobilinogen, UA: 0.2 mg/dL (ref 0.0–1.0)

## 2013-07-17 LAB — URINE MICROSCOPIC-ADD ON

## 2013-07-17 LAB — HEPATIC FUNCTION PANEL
Albumin: 3.3 g/dL — ABNORMAL LOW (ref 3.5–5.2)
Indirect Bilirubin: 0.5 mg/dL (ref 0.3–0.9)
Total Protein: 7.1 g/dL (ref 6.0–8.3)

## 2013-07-17 LAB — POCT I-STAT, CHEM 8
Glucose, Bld: 167 mg/dL — ABNORMAL HIGH (ref 70–99)
HCT: 44 % (ref 36.0–46.0)
Hemoglobin: 15 g/dL (ref 12.0–15.0)
Potassium: 3.4 mEq/L — ABNORMAL LOW (ref 3.5–5.1)
Sodium: 138 mEq/L (ref 135–145)
TCO2: 25 mmol/L (ref 0–100)

## 2013-07-17 LAB — TSH: TSH: 4.216 u[IU]/mL (ref 0.350–4.500)

## 2013-07-17 LAB — LIPASE, BLOOD: Lipase: 20 U/L (ref 11–59)

## 2013-07-17 LAB — CBC WITH DIFFERENTIAL/PLATELET
Basophils Absolute: 0.1 10*3/uL (ref 0.0–0.1)
Basophils Relative: 1 % (ref 0–1)
Hemoglobin: 13.8 g/dL (ref 12.0–15.0)
MCHC: 33.4 g/dL (ref 30.0–36.0)
Monocytes Relative: 7 % (ref 3–12)
Neutro Abs: 2.9 10*3/uL (ref 1.7–7.7)
Neutrophils Relative %: 44 % (ref 43–77)
Platelets: 122 10*3/uL — ABNORMAL LOW (ref 150–400)
RDW: 13.5 % (ref 11.5–15.5)

## 2013-07-17 MED ORDER — ACETAMINOPHEN 650 MG RE SUPP: 650.0000 mg | Freq: Four times a day (QID) | RECTAL | Status: DC | PRN

## 2013-07-17 MED ORDER — POTASSIUM CHLORIDE IN NACL 40-0.9 MEQ/L-% IV SOLN
INTRAVENOUS | Status: DC
  Administered 2013-07-17 – 2013-07-18 (×2): via INTRAVENOUS
  Filled 2013-07-17 (×3): qty 1000

## 2013-07-17 MED ORDER — FENTANYL CITRATE 0.05 MG/ML IJ SOLN
50.0000 ug | Freq: Once | INTRAMUSCULAR | Status: AC
  Administered 2013-07-17: 50 ug via INTRAVENOUS
  Filled 2013-07-17: qty 2

## 2013-07-17 MED ORDER — SODIUM CHLORIDE 0.9 % IV SOLN
INTRAVENOUS | Status: DC
  Filled 2013-07-17 (×3): qty 1000

## 2013-07-17 MED ORDER — ONDANSETRON HCL 4 MG/2ML IJ SOLN: 4.0000 mg | Freq: Four times a day (QID) | INTRAMUSCULAR | Status: DC | PRN

## 2013-07-17 MED ORDER — LISINOPRIL 40 MG PO TABS
40.0000 mg | ORAL_TABLET | Freq: Every day | ORAL | Status: DC
  Administered 2013-07-17 – 2013-07-18 (×2): 40 mg via ORAL
  Filled 2013-07-17 (×2): qty 1

## 2013-07-17 MED ORDER — DONEPEZIL HCL 10 MG PO TABS
10.0000 mg | ORAL_TABLET | Freq: Every day | ORAL | Status: DC
  Administered 2013-07-17 – 2013-07-18 (×2): 10 mg via ORAL
  Filled 2013-07-17 (×2): qty 1

## 2013-07-17 MED ORDER — LATANOPROST 0.005 % OP SOLN
1.0000 [drp] | Freq: Every day | OPHTHALMIC | Status: DC
  Administered 2013-07-17: 1 [drp] via OPHTHALMIC
  Filled 2013-07-17: qty 2.5

## 2013-07-17 MED ORDER — BISACODYL 10 MG RE SUPP: 10.0000 mg | Freq: Every day | RECTAL | Status: DC | PRN

## 2013-07-17 MED ORDER — MAGNESIUM CITRATE PO SOLN: 1.0000 | Freq: Once | ORAL | Status: AC | PRN

## 2013-07-17 MED ORDER — ONDANSETRON HCL 4 MG PO TABS: 4.0000 mg | ORAL_TABLET | Freq: Four times a day (QID) | ORAL | Status: DC | PRN

## 2013-07-17 MED ORDER — ACETAMINOPHEN 325 MG PO TABS: 650.0000 mg | ORAL_TABLET | Freq: Four times a day (QID) | ORAL | Status: DC | PRN

## 2013-07-17 MED ORDER — POTASSIUM CHLORIDE IN NACL 40-0.9 MEQ/L-% IV SOLN: INTRAVENOUS | Status: DC

## 2013-07-17 MED ORDER — ENOXAPARIN SODIUM 40 MG/0.4ML ~~LOC~~ SOLN
40.0000 mg | SUBCUTANEOUS | Status: DC
  Filled 2013-07-17: qty 0.4

## 2013-07-17 MED ORDER — PANTOPRAZOLE SODIUM 40 MG PO TBEC
40.0000 mg | DELAYED_RELEASE_TABLET | Freq: Every day | ORAL | Status: DC
  Administered 2013-07-17 – 2013-07-18 (×2): 40 mg via ORAL
  Filled 2013-07-17 (×2): qty 1

## 2013-07-17 MED ORDER — POTASSIUM CHLORIDE CRYS ER 20 MEQ PO TBCR
20.0000 meq | EXTENDED_RELEASE_TABLET | Freq: Two times a day (BID) | ORAL | Status: DC
  Administered 2013-07-17 – 2013-07-18 (×3): 20 meq via ORAL
  Filled 2013-07-17 (×4): qty 1

## 2013-07-17 MED ORDER — GI COCKTAIL ~~LOC~~
30.0000 mL | Freq: Once | ORAL | Status: AC
  Administered 2013-07-17: 30 mL via ORAL
  Filled 2013-07-17: qty 30

## 2013-07-17 MED ORDER — MORPHINE SULFATE 2 MG/ML IJ SOLN: 2.0000 mg | INTRAMUSCULAR | Status: DC | PRN

## 2013-07-17 MED ORDER — ENOXAPARIN SODIUM 30 MG/0.3ML ~~LOC~~ SOLN
30.0000 mg | SUBCUTANEOUS | Status: DC
  Administered 2013-07-17: 30 mg via SUBCUTANEOUS
  Filled 2013-07-17: qty 0.3

## 2013-07-17 MED ORDER — ALBUTEROL SULFATE (5 MG/ML) 0.5% IN NEBU: 2.5000 mg | INHALATION_SOLUTION | RESPIRATORY_TRACT | Status: DC | PRN

## 2013-07-17 MED ORDER — INSULIN ASPART 100 UNIT/ML ~~LOC~~ SOLN
0.0000 [IU] | Freq: Three times a day (TID) | SUBCUTANEOUS | Status: DC
  Administered 2013-07-17: 12:00:00 1 [IU] via SUBCUTANEOUS

## 2013-07-17 MED ORDER — SODIUM CHLORIDE 0.9 % IJ SOLN
3.0000 mL | Freq: Two times a day (BID) | INTRAMUSCULAR | Status: DC
  Administered 2013-07-17: 22:00:00 3 mL via INTRAVENOUS

## 2013-07-17 MED ORDER — LORAZEPAM 0.5 MG PO TABS: 0.5000 mg | ORAL_TABLET | Freq: Three times a day (TID) | ORAL | Status: DC | PRN

## 2013-07-17 MED ORDER — ALUM & MAG HYDROXIDE-SIMETH 200-200-20 MG/5ML PO SUSP
30.0000 mL | Freq: Four times a day (QID) | ORAL | Status: DC | PRN
Start: 2013-07-17 — End: 2013-07-18

## 2013-07-17 MED ORDER — POLYETHYLENE GLYCOL 3350 17 G PO PACK
17.0000 g | PACK | Freq: Every day | ORAL | Status: DC | PRN
  Filled 2013-07-17: qty 1

## 2013-07-17 MED ORDER — MORPHINE SULFATE 4 MG/ML IJ SOLN: 4.0000 mg | Freq: Once | INTRAMUSCULAR | Status: DC

## 2013-07-17 MED ORDER — DOCUSATE SODIUM 100 MG PO CAPS
100.0000 mg | ORAL_CAPSULE | Freq: Two times a day (BID) | ORAL | Status: DC
  Administered 2013-07-17 – 2013-07-18 (×3): 100 mg via ORAL
  Filled 2013-07-17 (×4): qty 1

## 2013-07-17 MED ORDER — OXYCODONE HCL 5 MG PO TABS: 5.0000 mg | ORAL_TABLET | ORAL | Status: DC | PRN

## 2013-07-17 NOTE — Progress Notes (Signed)
Patient declines MyChart activation-does not have a computer at home

## 2013-07-17 NOTE — Consult Note (Signed)
Seen, agree with above. Abd soft, non tender, non distended.  Pt denies pain now, has had flatus today.   There is possible concern for loop or torsion, but unlikely to have flatus or be pain free if that is the case.  Will follow up films tomorrow and see if she is having high medication requirement.   May need exploration tomorrow.

## 2013-07-17 NOTE — ED Provider Notes (Signed)
CSN: [id number]     Arrival date & time 07/17/13  0449 History   First MD Initiated Contact with Patient 07/17/13 0454     Chief Complaint  Patient presents with  . Abdominal Pain   (Consider location/radiation/quality/duration/timing/severity/associated sxs/prior Treatment) Patient is a 79 y.o. female presenting with abdominal pain. The history is provided by the EMS personnel. The history is limited by the condition of the patient (dementia).  Abdominal Pain Pain location:  LLQ and RLQ Pain quality comment:  Unable Pain radiates to:  Does not radiate Pain severity:  Moderate Onset quality:  Gradual Timing:  Constant Progression:  Unchanged Chronicity:  New Context: previous surgery   Relieved by:  Nothing Worsened by:  Nothing tried Ineffective treatments:  None tried Associated symptoms: no dysuria   Risk factors: being elderly     Past Medical History  Diagnosis Date  . Hypertension   . Diastolic dysfunction, left ventricle by ECHO 2011 03/25/2012  . Hypothyroidism 03/25/2012  . High cholesterol   . Type II diabetes mellitus   . H/O hiatal hernia   . Duodenal perforation 03/23/12  . Arthritis     "in my back"   Past Surgical History  Procedure Laterality Date  . Bladder tacked    . Repair perforated ulcer  03/23/12  . Diagnostic laparoscopy  03/25/12    REPAIR OF PERFORATED ULCER with omental patch  . Abcess drainage  03/25/12     of abdominal & pelvic abscesses  . Laparoscopy  03/24/2012    Procedure: LAPAROSCOPY DIAGNOSTIC;  Surgeon: Ardeth Sportsman, MD;  Location: [facility] OR;  Service: General;  Laterality: N/A;  drainage of abdominal and pelvic abcesses  . Laparotomy  06/03/2012    Procedure: EXPLORATORY LAPAROTOMY;  Surgeon: Mariella Saa, MD;  Location: [facility] OR;  Service: General;  Laterality: N/A;   History reviewed. No pertinent family history. History  Substance Use Topics  . Smoking status: Never Smoker   . Smokeless tobacco: Current User    Types: Snuff     Comment: 03/28/12 "use snuff q once in while; not regular"  . Alcohol Use: No   OB History   Grav Para Term Preterm Abortions TAB SAB Ect Mult Living                 Review of Systems  Unable to perform ROS Gastrointestinal: Positive for abdominal pain.  Genitourinary: Negative for dysuria.    Allergies  Review of patient's allergies indicates no known allergies.  Home Medications   Current Outpatient Rx  Name  Route  Sig  Dispense  Refill  . bisoprolol-hydrochlorothiazide (ZIAC) 5-6.25 MG per tablet   Oral   Take 1 tablet by mouth daily.         . clorazepate (TRANXENE) 7.5 MG tablet   Oral   Take 7.5 mg by mouth 3 (three) times daily as needed for anxiety (anxiety).         Marland Kitchen donepezil (ARICEPT) 10 MG tablet   Oral   Take 10 mg by mouth at bedtime.         . furosemide (LASIX) 40 MG tablet   Oral   Take 40 mg by mouth daily.         Marland Kitchen levothyroxine (SYNTHROID, LEVOTHROID) 50 MCG tablet   Oral   Take 50 mcg by mouth daily.         Marland Kitchen lisinopril (PRINIVIL,ZESTRIL) 40 MG tablet   Oral   Take 40 mg by mouth daily.         Marland Kitchen  pantoprazole (PROTONIX) 40 MG tablet   Oral   Take 40 mg by mouth daily.         . potassium chloride SA (K-DUR,KLOR-CON) 20 MEQ tablet   Oral   Take 20 mEq by mouth 2 (two) times daily.         . Travoprost, BAK Free, (TRAVATAN) 0.004 % SOLN ophthalmic solution   Both Eyes   Place 1 drop into both eyes at bedtime.          BP 202/57  Pulse 51  Temp(Src) 98.4 F (36.9 C) (Oral)  Resp 16  SpO2 100% Physical Exam  Constitutional: She appears well-developed and well-nourished. No distress.  HENT:  Head: Normocephalic and atraumatic.  Mouth/Throat: Oropharynx is clear and moist.  Eyes: Conjunctivae are normal. Pupils are equal, round, and reactive to light.  Neck: Normal range of motion. Neck supple.  Cardiovascular: Normal rate, regular rhythm and intact distal pulses.   Pulmonary/Chest: Effort normal and breath  sounds normal. She has no wheezes. She has no rales.  Abdominal: Soft. Bowel sounds are increased. There is no tenderness. There is no rebound and no guarding.  Musculoskeletal: Normal range of motion. She exhibits no edema.  Neurological: She is alert. She has normal reflexes.  Skin: Skin is warm and dry.  Psychiatric: She has a normal mood and affect.    ED Course  Procedures (including critical care time) Labs Review Labs Reviewed  CBC WITH DIFFERENTIAL - Abnormal; Notable for the following:    Platelets 122 (*)    Eosinophils Relative 6 (*)    All other components within normal limits  POCT I-STAT, CHEM 8 - Abnormal; Notable for the following:    Potassium 3.4 (*)    Creatinine, Ser 1.20 (*)    Glucose, Bld 167 (*)    All other components within normal limits  URINALYSIS, ROUTINE W REFLEX MICROSCOPIC  HEPATIC FUNCTION PANEL  LIPASE, BLOOD   Imaging Review No results found.  MDM  Case d/w Dr. Donell Beers.  Admit to medicine, will consult.    Case d/w Ramiro Harvest    Omario Ander K Cheyann Blecha-Rasch, MD 07/17/13 [phone]

## 2013-07-17 NOTE — Progress Notes (Signed)
Patient reports that she had her glasses when in the ED. Checked ED and belongings bag-no glasses with the patient.

## 2013-07-17 NOTE — Consult Note (Signed)
Reason for Consult:abdominal pain Referring Physician: Dr. Janee Morn   HPI: Megan [name] is a 79 year old AAF with a history of dementia, Type II DM, hypertension, diastolic dysfunction,hypothyroidism,  previous laparoscopic closure of perforated duodenal ulcer by Dr. Michaell Cowing in May of 2013, SBO with subsequent exploratory laparotomy by Dr. Johna Sheriff 06/03/12 with lysis of adhesions at that time.  She presented to [facility] this morning with sudden onset abdominal pain.  The patient states it began early this morning.  She took her medication, drank water and tried to go back to sleep, however, was unable due to severity.  The pain is characterized as "worse than childbirth."  Location lower abdomen.  Time pattern is constant.  Progression; improved to 2-3/10 since receiving pain medication.  Modifying factors include; drinking water.  Associated factors; none.  She denies nausea, vomiting or chills.  Last bowel movement was 2 days ago. She has not had these symptoms for about 1 year now she states.  Following last surgery, she has not had the need to take any laxatives. She denies fever, chills or sweats.  Denies diarrhea.  No known weight loss, malaise.  She lives at home with her husband and son which is the POA, Mardella Layman.  He is not present at this time, daughter is present, but unable to provide much history.    Past Medical History  Diagnosis Date  . Hypertension   . Diastolic dysfunction, left ventricle by ECHO 2011 03/25/2012  . Hypothyroidism 03/25/2012  . High cholesterol   . Type II diabetes mellitus   . H/O hiatal hernia   . Duodenal perforation 03/23/12  . Arthritis     "in my back"    Past Surgical History  Procedure Laterality Date  . Bladder tacked    . Repair perforated ulcer  03/23/12  . Diagnostic laparoscopy  03/25/12    REPAIR OF PERFORATED ULCER with omental patch  . Abcess drainage  03/25/12     of abdominal & pelvic abscesses  . Laparoscopy  03/24/2012    Procedure:  LAPAROSCOPY DIAGNOSTIC;  Surgeon: Ardeth Sportsman, MD;  Location: [facility] OR;  Service: General;  Laterality: N/A;  drainage of abdominal and pelvic abcesses  . Laparotomy  06/03/2012    Procedure: EXPLORATORY LAPAROTOMY;  Surgeon: Mariella Saa, MD;  Location: [facility] OR;  Service: General;  Laterality: N/A;    History reviewed. No pertinent family history.  Social History:  reports that she has never smoked. Her smokeless tobacco use includes Snuff. She reports that she does not drink alcohol or use illicit drugs.  Allergies: No Known Allergies  Medications: {medication reviewed  Results for orders placed during the hospital encounter of 07/17/13 (from the past 48 hour(s))  CBC WITH DIFFERENTIAL     Status: Abnormal   Collection Time    07/17/13  5:11 AM      Result Value Range   WBC 6.6  4.0 - 10.5 K/uL   RBC 4.64  3.87 - 5.11 MIL/uL   Hemoglobin 13.8  12.0 - 15.0 g/dL   HCT 65.7  84.6 - 96.2 %   MCV 89.0  78.0 - 100.0 fL   MCH 29.7  26.0 - 34.0 pg   MCHC 33.4  30.0 - 36.0 g/dL   RDW 95.2  84.1 - 32.4 %   Platelets 122 (*) 150 - 400 K/uL   Neutrophils Relative % 44  43 - 77 %   Neutro Abs 2.9  1.7 - 7.7 K/uL  Lymphocytes Relative 42  12 - 46 %   Lymphs Abs 2.8  0.7 - 4.0 K/uL   Monocytes Relative 7  3 - 12 %   Monocytes Absolute 0.5  0.1 - 1.0 K/uL   Eosinophils Relative 6 (*) 0 - 5 %   Eosinophils Absolute 0.4  0.0 - 0.7 K/uL   Basophils Relative 1  0 - 1 %   Basophils Absolute 0.1  0.0 - 0.1 K/uL  HEPATIC FUNCTION PANEL     Status: Abnormal   Collection Time    07/17/13  5:11 AM      Result Value Range   Total Protein 7.1  6.0 - 8.3 g/dL   Albumin 3.3 (*) 3.5 - 5.2 g/dL   AST 18  0 - 37 U/L   ALT 8  0 - 35 U/L   Alkaline Phosphatase 86  39 - 117 U/L   Total Bilirubin 0.6  0.3 - 1.2 mg/dL   Bilirubin, Direct 0.1  0.0 - 0.3 mg/dL   Indirect Bilirubin 0.5  0.3 - 0.9 mg/dL  LIPASE, BLOOD     Status: None   Collection Time    07/17/13  5:11 AM      Result Value Range    Lipase 20  11 - 59 U/L  POCT I-STAT, CHEM 8     Status: Abnormal   Collection Time    07/17/13  5:29 AM      Result Value Range   Sodium 138  135 - 145 mEq/L   Potassium 3.4 (*) 3.5 - 5.1 mEq/L   Chloride 99  96 - 112 mEq/L   BUN 20  6 - 23 mg/dL   Creatinine, Ser 1.61 (*) 0.50 - 1.10 mg/dL   Glucose, Bld 096 (*) 70 - 99 mg/dL   Calcium, Ion 0.45  4.09 - 1.30 mmol/L   TCO2 25  0 - 100 mmol/L   Hemoglobin 15.0  12.0 - 15.0 g/dL   HCT 81.1  91.4 - 78.2 %  URINALYSIS, ROUTINE W REFLEX MICROSCOPIC     Status: Abnormal   Collection Time    07/17/13  6:01 AM      Result Value Range   Color, Urine YELLOW  YELLOW   APPearance CLOUDY (*) CLEAR   Specific Gravity, Urine 1.019  1.005 - 1.030   pH 5.0  5.0 - 8.0   Glucose, UA NEGATIVE  NEGATIVE mg/dL   Hgb urine dipstick SMALL (*) NEGATIVE   Bilirubin Urine NEGATIVE  NEGATIVE   Ketones, ur NEGATIVE  NEGATIVE mg/dL   Protein, ur NEGATIVE  NEGATIVE mg/dL   Urobilinogen, UA 0.2  0.0 - 1.0 mg/dL   Nitrite NEGATIVE  NEGATIVE   Leukocytes, UA NEGATIVE  NEGATIVE  URINE MICROSCOPIC-ADD ON     Status: Abnormal   Collection Time    07/17/13  6:01 AM      Result Value Range   Squamous Epithelial / LPF RARE  RARE   WBC, UA 0-2  <3 WBC/hpf   RBC / HPF 0-2  <3 RBC/hpf   Bacteria, UA FEW (*) RARE   Casts HYALINE CASTS (*) NEGATIVE    Ct Abdomen Pelvis Wo Contrast  07/17/2013   *RADIOLOGY REPORT*  Clinical Data: Bilateral lower abdominal pain and hematuria.  Small bowel obstruction 1 year ago.  Diabetic hypertensive patient with history of hiatal hernia and prior surgery for perforated duodenal ulcer, abscess drainage and bladder tact procedure.  CT ABDOMEN AND PELVIS WITHOUT CONTRAST  Technique:  Multidetector CT imaging of the abdomen and pelvis was performed following the standard protocol without intravenous contrast.  Comparison: Plain film examination 07/17/2013.  CT abdomen pelvis 05/28/2012.  Findings: Swirling pattern of the small bowel  mesentery (and contain vessels) directed to the right lower quadrant with mild hazy infiltration of mesenteric fat planes which may indicate mild inflammation. Lax mesentery and rotation may contribute to this inflammation.  Small bowel loops are fluid filled and normal to top normal in size.  No CT findings of appendicitis.  Scattered diverticula most notable left colon/sigmoid colon without evidence of diverticulitis.  No CT evidence of recurrent duodenal ulcer or bowel perforation.  Anterior abdominal wall small fatty containing hernia.  Prominent atherosclerotic type changes with coronary artery calcifications and calcification with ectasia of the abdominal aorta (measuring up to 2.5 cm) and iliac arteries (common iliac artery measuring up to 1.5 cm).  Atherosclerotic type changes femoral arteries and aortic branch vessels.  Mild fullness of the proximal right renal collecting system without point of obstruction identified.  Complex bilateral renal cysts with coarse peripheral calcifications similar to the prior exam.  Evaluation of solid abdominal viscera is limited by lack of IV contrast.  Taking this limitation into account no worrisome hepatic, splenic, pancreatic or adrenal lesion.  Marked degenerative changes L3-4, L4-5 and L5-S1.  Bilateral hip joint degenerative changes.  Minimal scarring lung bases.  Heart size top normal to slightly enlarged.  IMPRESSION: Swirling pattern of the small bowel mesentery (and contained vessels) directed to the right lower quadrant with mild hazy infiltration of mesenteric fat planes which may indicate mild inflammation. Lax mesentery and rotation may contribute to this inflammation.  Small bowel loops are fluid filled and normal to top normal in size.  Scattered diverticula most notable left colon/sigmoid colon without evidence of diverticulitis.  Mild fullness of the proximal right renal collecting system without point of obstruction identified.  Complex bilateral renal  cysts with coarse peripheral calcifications similar to the prior exam.  Atherosclerotic type changes as detailed above.   Original Report Authenticated By: Lacy Duverney, M.D.   Dg Abd Acute W/chest  07/17/2013   CLINICAL DATA:  Pain.  Low abdominal pain.  EXAM: ACUTE ABDOMEN SERIES (ABDOMEN 2 VIEW & CHEST 1 VIEW)  COMPARISON:  02/27/2013.  FINDINGS: Cardiomegaly, chronic. Aortic atherosclerosis and tortuosity. No infiltrate, edema, effusion, or pneumothorax.  Nonobstructive bowel gas pattern. Formed stool present in the ascending and descending colon. No pneumoperitoneum. No suspicious calcifications.  Diffuse advanced degenerative disc disease. Hip osteoarthritis. SI joint osteoarthritis with chronic right ilium sclerosis.  IMPRESSION: 1.  Nonobstructive bowel gas pattern. No pneumoperitoneum.  2.  No evidence of acute cardiopulmonary disease.   Electronically Signed   By: Tiburcio Pea   On: 07/17/2013 05:47    Review of Systems  Constitutional: Negative for fever, chills, weight loss and malaise/fatigue.  Respiratory: Negative for cough, shortness of breath and wheezing.   Cardiovascular: Negative for chest pain, palpitations and leg swelling.  Gastrointestinal: Positive for abdominal pain. Negative for nausea, vomiting, diarrhea, constipation, blood in stool and melena.  Genitourinary: Negative for dysuria and urgency.  Neurological: Negative for dizziness, tingling, sensory change, speech change, loss of consciousness and weakness.  Psychiatric/Behavioral: The patient is not nervous/anxious.    Blood pressure 169/49, pulse 50, temperature 98.4 F (36.9 C), temperature source Oral, resp. rate 12, SpO2 100.00%. Physical Exam  Constitutional: She is oriented to person, place, and time. She appears well-developed and well-nourished. No distress.  HENT:  Head: Normocephalic and atraumatic.  Eyes: Right eye exhibits no discharge. Left eye exhibits no discharge. No scleral icterus.  +2, nr, hx  of cataracts  Neck: Neck supple. No JVD present.  Cardiovascular: Normal rate and intact distal pulses.  Exam reveals no gallop and no friction rub.   No murmur heard. bradycardia  Respiratory: Effort normal. No respiratory distress. She has no wheezes. She has no rales.  GI: Soft. Bowel sounds are normal. She exhibits no distension and no mass. There is no rebound and no guarding.  Minimal discomfort RLQ and LLQ.  Midline scar  Musculoskeletal: She exhibits no edema and no tenderness.  Lymphadenopathy:    She has no cervical adenopathy.  Neurological: She is alert and oriented to person, place, and time.  Skin: Skin is warm and dry. No rash noted. She is not diaphoretic. No erythema. No pallor.  Psychiatric: She has a normal mood and affect. Her behavior is normal. Judgment and thought content normal.    Assessment/Plan: Abdominal pain: on physical exam she has minimal tenderness, there is no evidence of acute abdomen, WBC normal and without tachycardia. There is no evidence of distention and bowel sounds are present.  Given her CT scan of abdomen revealing a swirling pattern of small bowel concerning for closed loop bowel obstruction or internal hernia, recommend admitting for further observation.  Should she develop nausea vomiting or distention, recommend NGT placement to low intermittent suction and NPO. Agree with clear liquid diet, pain control, and hydration.  She is quite bradycardic, recommend cardiology evaluation and telemetry monitoring.  I discussed the plan of care with the daughter, POA is Mardella Layman who will be present later today.  We discussed the possibly of surgical intervention, the patient would like to avoid if at all possible.  At present time, there are no indications for surgical intervention.  Repeat Abdominal XR tomorrow.  Surgery will continue to follow. Thank you for the consult.    Ashok Norris ANP-BC Pager [phone]  07/17/2013, 9:07 AM

## 2013-07-17 NOTE — H&P (Signed)
[facility] History and Physical  Megan [name] MVH:[id number] DOB: December 27, 1926 DOA: 07/17/2013  Referring physician: Dr Terressa Koyanagi PCP: [name], MD  Specialists:   Chief Complaint: abdominal pain  HPI: Megan [name] is a 79 y.o. female  With history hx DM, HTN, hyperlipidemia, hypothyroidism, prior hx of duodenal perforation status post laparoscopic closure per Dr. Michaell Cowing May of 2013, SBO with subsequent expiratory laparotomy by Dr. Johna Sheriff 06/03/2012 with lysis of adhesions at that time, dementia who presents to the ED with several hour history of bilateral lower intermittent abdominal pain which is excruciating which patient states is worse than childbirth and she's had 11 children. Patient states abdominal pain started the night prior to admission and she describes it as a heavy pain which is non-radiating and intermittent in nature. Patient denies any fevers, no chills, no nausea, no vomiting, no diarrhea, no constipation, no chest pain, no shortness of breath, no melena, no hematemesis, no hematochezia. Patient stated had a prior history of constipation however that has since resolved. Patient has been given pain medication in the ED including fentanyl. Patient was seen in the ED basic metabolic profile obtained at a potassium of 3.4 creatinine 1.20 otherwise was within normal limits. Hepatic panel and albumin of 3.3 otherwise was within normal limits. CBC was unremarkable. Patient noted to be bradycardic on telemetry in the 40s to 50s. Acute abdominal series was unremarkable. CT of the abdomen and pelvis showed a swelling pattern of the small bowel mesentery and contained vessels director to the right lower quadrant with mild hazy infiltration of mesenteric fat planes which may indicate mild inflammation. Lacks mesentery and rotation may contribute to this inflammation. Small bowel loops are fluid-filled and normal to top normal in size. Scattered diverticula are noted  without evidence of diverticulitis. Mild fullness of the proximal right renal collecting system without obstruction. Complex bilateral renal cysts with coarse peripheral calcifications. Negative for appendicitis. We were called to admit the patient for further evaluation and management.  Review of Systems: The patient denies anorexia, fever, weight loss,, vision loss, decreased hearing, hoarseness, chest pain, syncope, dyspnea on exertion, peripheral edema, balance deficits, hemoptysis, abdominal pain, melena, hematochezia, severe indigestion/heartburn, hematuria, incontinence, genital sores, muscle weakness, suspicious skin lesions, transient blindness, difficulty walking, depression, unusual weight change, abnormal bleeding, enlarged lymph nodes, angioedema, and breast masses.   Past Medical History  Diagnosis Date  . Hypertension   . Diastolic dysfunction, left ventricle by ECHO 2011 03/25/2012  . Hypothyroidism 03/25/2012  . High cholesterol   . Type II diabetes mellitus   . H/O hiatal hernia   . Duodenal perforation 03/23/12  . Arthritis     "in my back"   Past Surgical History  Procedure Laterality Date  . Bladder tacked    . Repair perforated ulcer  03/23/12  . Diagnostic laparoscopy  03/25/12    REPAIR OF PERFORATED ULCER with omental patch  . Abcess drainage  03/25/12     of abdominal & pelvic abscesses  . Laparoscopy  03/24/2012    Procedure: LAPAROSCOPY DIAGNOSTIC;  Surgeon: Ardeth Sportsman, MD;  Location: [facility] OR;  Service: General;  Laterality: N/A;  drainage of abdominal and pelvic abcesses  . Laparotomy  06/03/2012    Procedure: EXPLORATORY LAPAROTOMY;  Surgeon: Mariella Saa, MD;  Location[facility] OR;  Service: General;  Laterality: N/A;   Social History:  reports that she has never smoked. Her smokeless tobacco use includes Snuff. She reports that she does not drink alcohol or use  illicit drugs.  No Known Allergies  History reviewed. No pertinent family history.   Prior to  Admission medications   Medication Sig Start Date End Date Taking? Authorizing Provider  amoxicillin (AMOXIL) 500 MG capsule Take 500 mg by mouth 3 (three) times daily. Originally filled 05/29/13 for 24 capsules. As of 07/16/13 still taking "as needed".   Yes Historical Provider, MD  aspirin 325 MG tablet Take 325 mg by mouth daily as needed for pain. Recently started.   Yes Historical Provider, MD  bisoprolol-hydrochlorothiazide (ZIAC) 5-6.25 MG per tablet Take 1 tablet by mouth daily.   Yes Historical Provider, MD  cephALEXin (KEFLEX) 500 MG capsule Take 500 mg by mouth 4 (four) times daily. Originally filled 06/09/13. 15 day course. Still taking "as needed".   Yes Historical Provider, MD  Cholecalciferol (VITAMIN D) 2000 UNITS tablet Take 2,000 Units by mouth daily.   Yes Historical Provider, MD  donepezil (ARICEPT) 10 MG tablet Take 10 mg by mouth daily.    Yes Historical Provider, MD  furosemide (LASIX) 40 MG tablet Take 40 mg by mouth daily as needed for fluid or edema.    Yes Historical Provider, MD  lisinopril (PRINIVIL,ZESTRIL) 40 MG tablet Take 40 mg by mouth daily.   Yes Historical Provider, MD  pantoprazole (PROTONIX) 40 MG tablet Take 40 mg by mouth daily.   Yes Kathlen Mody, MD  potassium chloride SA (K-DUR,KLOR-CON) 20 MEQ tablet Take 20 mEq by mouth 2 (two) times daily.   Yes Historical Provider, MD  Travoprost, BAK Free, (TRAVATAN) 0.004 % SOLN ophthalmic solution Place 1 drop into both eyes at bedtime.   Yes Historical Provider, MD   Physical Exam: Filed Vitals:   07/17/13 0830  BP: 169/49  Pulse: 50  Temp:   Resp: 12     General:  Elderly female laying on a gurney in no acute cardiopulmonary distress.  Eyes: Arteries analysis. Pupils equal round and reactive to light and accommodation. Extraocular movement intact. Muddy sclera  ENT: Oropharynx is clear, no lesions, no exudates.  Neck: Supple with no lymphadenopathy.  Cardiovascular: Bradycardic. No murmurs rubs or  gallops.  Respiratory: Clear to auscultation bilaterally no wheezes no crackles no rhonchi.  Abdomen: Soft, nondistended, decreased positive bowel sounds, some tenderness to deep palpation in the lower abdomen.(Patient has received pain medications)  Skin: No rashes or lesions.  Musculoskeletal: 4/5 BUE strength, 4/5 BLE strength  Psychiatric: Normal mood. Normal affect. Fair Insight. Fair judgment.  Neurologic: Alert and oriented x3. Cranial nerves II through XII are grossly intact. No focal deficits.  Labs on Admission:  Basic Metabolic Panel:  Recent Labs Lab 07/17/13 0529  NA 138  K 3.4*  CL 99  GLUCOSE 167*  BUN 20  CREATININE 1.20*   Liver Function Tests:  Recent Labs Lab 07/17/13 0511  AST 18  ALT 8  ALKPHOS 86  BILITOT 0.6  PROT 7.1  ALBUMIN 3.3*    Recent Labs Lab 07/17/13 0511  LIPASE 20   No results found for this basename: AMMONIA,  in the last 168 hours CBC:  Recent Labs Lab 07/17/13 0511 07/17/13 0529  WBC 6.6  --   NEUTROABS 2.9  --   HGB 13.8 15.0  HCT 41.3 44.0  MCV 89.0  --   PLT 122*  --    Cardiac Enzymes: No results found for this basename: CKTOTAL, CKMB, CKMBINDEX, TROPONINI,  in the last 168 hours  BNP (last 3 results) No results found for this basename: PROBNP,  in the last 8760 hours CBG: No results found for this basename: GLUCAP,  in the last 168 hours  Radiological Exams on Admission: Ct Abdomen Pelvis Wo Contrast  07/17/2013   *RADIOLOGY REPORT*  Clinical Data: Bilateral lower abdominal pain and hematuria.  Small bowel obstruction 1 year ago.  Diabetic hypertensive patient with history of hiatal hernia and prior surgery for perforated duodenal ulcer, abscess drainage and bladder tact procedure.  CT ABDOMEN AND PELVIS WITHOUT CONTRAST  Technique:  Multidetector CT imaging of the abdomen and pelvis was performed following the standard protocol without intravenous contrast.  Comparison: Plain film examination 07/17/2013.   CT abdomen pelvis 05/28/2012.  Findings: Swirling pattern of the small bowel mesentery (and contain vessels) directed to the right lower quadrant with mild hazy infiltration of mesenteric fat planes which may indicate mild inflammation. Lax mesentery and rotation may contribute to this inflammation.  Small bowel loops are fluid filled and normal to top normal in size.  No CT findings of appendicitis.  Scattered diverticula most notable left colon/sigmoid colon without evidence of diverticulitis.  No CT evidence of recurrent duodenal ulcer or bowel perforation.  Anterior abdominal wall small fatty containing hernia.  Prominent atherosclerotic type changes with coronary artery calcifications and calcification with ectasia of the abdominal aorta (measuring up to 2.5 cm) and iliac arteries (common iliac artery measuring up to 1.5 cm).  Atherosclerotic type changes femoral arteries and aortic branch vessels.  Mild fullness of the proximal right renal collecting system without point of obstruction identified.  Complex bilateral renal cysts with coarse peripheral calcifications similar to the prior exam.  Evaluation of solid abdominal viscera is limited by lack of IV contrast.  Taking this limitation into account no worrisome hepatic, splenic, pancreatic or adrenal lesion.  Marked degenerative changes L3-4, L4-5 and L5-S1.  Bilateral hip joint degenerative changes.  Minimal scarring lung bases.  Heart size top normal to slightly enlarged.  IMPRESSION: Swirling pattern of the small bowel mesentery (and contained vessels) directed to the right lower quadrant with mild hazy infiltration of mesenteric fat planes which may indicate mild inflammation. Lax mesentery and rotation may contribute to this inflammation.  Small bowel loops are fluid filled and normal to top normal in size.  Scattered diverticula most notable left colon/sigmoid colon without evidence of diverticulitis.  Mild fullness of the proximal right renal  collecting system without point of obstruction identified.  Complex bilateral renal cysts with coarse peripheral calcifications similar to the prior exam.  Atherosclerotic type changes as detailed above.   Original Report Authenticated By: Lacy Duverney, M.D.   Dg Abd Acute W/chest  07/17/2013   CLINICAL DATA:  Pain.  Low abdominal pain.  EXAM: ACUTE ABDOMEN SERIES (ABDOMEN 2 VIEW & CHEST 1 VIEW)  COMPARISON:  02/27/2013.  FINDINGS: Cardiomegaly, chronic. Aortic atherosclerosis and tortuosity. No infiltrate, edema, effusion, or pneumothorax.  Nonobstructive bowel gas pattern. Formed stool present in the ascending and descending colon. No pneumoperitoneum. No suspicious calcifications.  Diffuse advanced degenerative disc disease. Hip osteoarthritis. SI joint osteoarthritis with chronic right ilium sclerosis.  IMPRESSION: 1.  Nonobstructive bowel gas pattern. No pneumoperitoneum.  2.  No evidence of acute cardiopulmonary disease.   Electronically Signed   By: Tiburcio Pea   On: 07/17/2013 05:47    EKG: Independently reviewed. Sinus bradycardia with a rate of 49  Assessment/Plan Principal Problem:   Abdominal pain, bilateral lower quadrant Active Problems:   DM (diabetes mellitus), type 2 with complications   Hypertension   Diastolic dysfunction,  left ventricle by ECHO 2011   Hypothyroidism   Hypokalemia   Dementia   Debility   Bradycardia  #1 bilateral lower quadrant abdominal pain Questionable etiology. ??? Torsion vs volvulus. Patient states pain is severe and worse than childbirth. Patient's pain is intermittent. Patient's pain is somewhat improved after receiving 2 doses of fentanyl in the ED. Acute abdominal series was unremarkable. CT of the abdomen and pelvis negative for appendicitis however shows a swirling pattern of small bowel mesentery directly to the right lower quadrant with mild hazy infiltration of mesenteric fat planes which may indicate mild inflammation. Lax mesentery and  rotation may contribute to this inflammation. Small bowel loops are fluid filled and normal to top normal in size. Will keep patient n.p.o. IV fluids. Pain management. General surgery has been consulted and will assess the patient.  #2 diabetes mellitus type 2 Stable. Check a hemoglobin A1c. Sliding scale insulin.  #3 hypothyroidism Check a TSH. Continue on the Synthroid.  #4 hypokalemia Replete.  #5 bradycardia Patient currently asymptomatic. EKG with sinus bradycardia. Patient on a beta blocker. Will hold beta blocker for now.  #6 hypertension Continue home dose lisinopril. Hold beta blocker and diuretic for now.  #7 dementia Continue Aricept.  #8 history of diastolic dysfunction Stable. Will hold Lasix for now. Follow.  #9 prophylaxis PPI for GI prophylaxis. Lovenox for DVT prophylaxis.  Code Status: Full Family Communication: Discussed with patient and daughter at bedside. Disposition Plan: Admit to telemetry  Time spent: 60 mins  [facility] Pager [phone]  If 7PM-7AM, please contact night-coverage [email or web address] Password [email or web address] 07/17/2013, 9:02 AM

## 2013-07-17 NOTE — Progress Notes (Signed)
UR completed Discussed with Dr Isla Pence

## 2013-07-17 NOTE — Evaluation (Signed)
Occupational Therapy Evaluation Patient Details Name: SYLVAN LAHM MRN: [id number] DOB: 07-14-1927 Today's Date: 07/17/2013 Time: 4098-1191 OT Time Calculation (min): 24 min  OT Assessment / Plan / Recommendation History of present illness Patient is a 79 y.o. female presenting with abdominal pain   Clinical Impression   Pt presents to OT with decreased I with ADL activity. Pt pleasantly confused and will likely need 24/7 S upon DC.  Will benefit from skilled OT to increase I with ADL activity and return to PLOF    OT Assessment  Patient needs continued OT Services    Follow Up Recommendations  Home health OT;Supervision/Assistance - 24 hour;SNF;Other (comment) (depending on amount of help at home)       Equipment Recommendations  None recommended by OT       Frequency  Min 2X/week    Precautions / Restrictions Precautions Precautions: Fall       ADL  Grooming: Performed;Teeth care;Wash/dry face Where Assessed - Grooming: Unsupported standing Upper Body Bathing: Simulated;Minimal assistance Where Assessed - Upper Body Bathing: Unsupported sitting Lower Body Bathing: Performed;Minimal assistance Where Assessed - Lower Body Bathing: Unsupported sit to stand Upper Body Dressing: Simulated;Min guard Where Assessed - Upper Body Dressing: Unsupported sitting Lower Body Dressing: Performed;Minimal assistance Where Assessed - Lower Body Dressing: Unsupported sit to stand Toilet Transfer: Performed;Minimal assistance Toilet Transfer Method: Sit to Barista: Bedside commode Toileting - Clothing Manipulation and Hygiene: Performed;Minimal assistance Where Assessed - Engineer, mining and Hygiene: Standing    OT Diagnosis: Generalized weakness  OT Problem List: Decreased strength;Decreased safety awareness;Impaired balance (sitting and/or standing);Decreased cognition;Decreased activity tolerance OT Treatment Interventions: Self-care/ADL  training;Patient/family education;DME and/or AE instruction;Energy conservation   OT Goals(Current goals can be found in the care plan section) Acute Rehab OT Goals Patient Stated Goal: get on home OT Goal Formulation: With patient Time For Goal Achievement: 07/31/13 Potential to Achieve Goals: Good ADL Goals Pt Will Perform Grooming: with supervision;standing Pt Will Perform Upper Body Dressing: with supervision;sitting Pt Will Perform Lower Body Dressing: with supervision;sit to/from stand Pt Will Transfer to Toilet: with supervision;regular height toilet;ambulating Pt Will Perform Toileting - Clothing Manipulation and hygiene: with supervision;sit to/from stand Pt Will Perform Tub/Shower Transfer: with supervision  Visit Information  Last OT Received On: 07/17/13 Assistance Needed: +1 History of Present Illness: Patient is a 79 y.o. female presenting with abdominal pain       Prior Functioning     Home Living Family/patient expects to be discharged to:: Private residence Living Arrangements: Spouse/significant other Type of Home: House Home Access: Stairs to enter Entrance Stairs-Rails: Can reach both Home Layout: One level Home Equipment: Environmental consultant - 2 wheels;Wheelchair - manual Prior Function Level of Independence: Needs assistance ADL's / Homemaking Assistance Needed: pt reports son does the cooking Communication Communication: No difficulties         Vision/Perception Vision - History Baseline Vision:  (pt cant find glasses. says she wore them here) Patient Visual Report: No change from baseline   Cognition  Cognition Arousal/Alertness: Awake/alert Behavior During Therapy: WFL for tasks assessed/performed Overall Cognitive Status: History of cognitive impairments - at baseline    Extremity/Trunk Assessment Upper Extremity Assessment Upper Extremity Assessment: Overall WFL for tasks assessed Lower Extremity Assessment Lower Extremity Assessment: Overall  WFL for tasks assessed     Mobility Bed Mobility Bed Mobility: Supine to Sit Supine to Sit: 4: Min assist;With rails;HOB elevated Transfers Transfers: Sit to Stand;Stand to Sit Sit to Stand: 4: Min assist;From  chair/3-in-1;From toilet;From bed;With upper extremity assist Stand to Sit: 4: Min assist;To toilet;To chair/3-in-1;With upper extremity assist           End of Session OT - End of Session Equipment Utilized During Treatment: Rolling walker Activity Tolerance: Patient tolerated treatment well Patient left: in chair;with call bell/phone within reach;with chair alarm set Nurse Communication: Mobility status  GO     Alba Cory 07/17/2013, 3:11 PM

## 2013-07-17 NOTE — ED Notes (Signed)
Per EMS, pt was at home and began having bilateral lower quad abdominal pain.  Started last pm and progressively worsened.  Pt lives with wheelchair bound husband.  Pt notified son and son is going to home to be with spouse.  Pt did say that she recently had bowel obstruction.  Hx htn:  Vitals:  160/74, hr 60

## 2013-07-17 NOTE — ED Notes (Signed)
hospitalist at bedside

## 2013-07-17 NOTE — ED Notes (Signed)
Bed: ZO10 Expected date:  Expected time:  Means of arrival:  Comments: Bed 24, EMS, 86 F, LLQ Abd Pain

## 2013-07-18 ENCOUNTER — Inpatient Hospital Stay (HOSPITAL_COMMUNITY): Payer: Medicare Other

## 2013-07-18 LAB — BASIC METABOLIC PANEL
BUN: 15 mg/dL (ref 6–23)
Chloride: 105 mEq/L (ref 96–112)
Glucose, Bld: 115 mg/dL — ABNORMAL HIGH (ref 70–99)
Potassium: 4.1 mEq/L (ref 3.5–5.1)
Sodium: 139 mEq/L (ref 135–145)

## 2013-07-18 LAB — CBC
HCT: 37.5 % (ref 36.0–46.0)
Hemoglobin: 12.4 g/dL (ref 12.0–15.0)
MCH: 29.5 pg (ref 26.0–34.0)
MCHC: 33.1 g/dL (ref 30.0–36.0)
RBC: 4.21 MIL/uL (ref 3.87–5.11)

## 2013-07-18 LAB — GLUCOSE, CAPILLARY: Glucose-Capillary: 97 mg/dL (ref 70–99)

## 2013-07-18 MED ORDER — ENSURE PUDDING PO PUDG
1.0000 | Freq: Three times a day (TID) | ORAL | Status: DC
  Filled 2013-07-18 (×2): qty 1

## 2013-07-18 NOTE — Care Management Note (Addendum)
    Page 1 of 1   07/18/2013     2:16:14 PM   CARE MANAGEMENT NOTE 07/18/2013  Patient:  Megan [name], Megan [name]   Account Number:  [id number]  Date Initiated:  07/18/2013  Documentation initiated by:  Lanier Clam  Subjective/Objective Assessment:   79 Y/O F ADMITTED W/ABD PAIN.HX:SBO,LAPAROTOMY.     Action/Plan:   FROM HOME W/SPOUSE-W/C BOUND.HAS PCP,PHARMACY.HAS CANE,RW.   Anticipated DC Date:  07/18/2013   Anticipated DC Plan:  HOME W HOME HEALTH SERVICES      DC Planning Services  CM consult      Choice offered to / List presented to:  C-1 Patient           Status of service:  Completed, signed off Medicare Important Message given?   (If response is "NO", the following Medicare IM given date fields will be blank) Date Medicare IM given:   Date Additional Medicare IM given:    Discharge Disposition:  HOME/SELF CARE  Per UR Regulation:  Reviewed for med. necessity/level of care/duration of stay  If discussed at Long Length of Stay Meetings, dates discussed:    Comments:  07/18/13 Tawney Vanorman RN,BSN NCM [phone] D/C HOME.NO ORDERS.PATIENT WAS D/C,& LEFT BUILDING PRIOR TO PROVIDING HHC AGENCY LIST,BUT NO ORDERS PUT IN.PATIENT WOULD HAVE DECLINED HHC SINCE ANXIOUS TO LEAVE HOSPITAL. OT-HH.AWAIT PT EVAL.WOULD RECOMMEND HHRN-MEDS,DISEASEMGMNT.PROVIDED [facility] AGENCY LIST.AWAIT FINAL HH ORDERS.

## 2013-07-18 NOTE — Evaluation (Signed)
Physical Therapy Evaluation Patient Details Name: Megan [name] MRN: [id number] DOB: Apr 30, 1927 Today's Date: 07/18/2013 Time: 4098-1191 PT Time Calculation (min): 16 min  PT Assessment / Plan / Recommendation History of Present Illness  Patient is a 79 y.o. female presenting with abdominal pain  Clinical Impression  On eval pt required supervision level assist for mobility-able to ambulate ~250 feet with RW. Cues for safety during session. Pt slightly agitated about having to remain in hospital-ready to go home. Recommend home safety evaluation at discharge.     PT Assessment  Patient needs continued PT services    Follow Up Recommendations  Home health PT (home safety evaluation)    Does the patient have the potential to tolerate intense rehabilitation      Barriers to Discharge        Equipment Recommendations  None recommended by PT    Recommendations for Other Services OT consult   Frequency Min 3X/week    Precautions / Restrictions Precautions Precautions: Fall Restrictions Weight Bearing Restrictions: No   Pertinent Vitals/Pain No c/o pain      Mobility  Bed Mobility Bed Mobility: Not assessed Transfers Transfers: Sit to Stand;Stand to Sit Sit to Stand: From chair/3-in-1;5: Supervision Stand to Sit: 5: Supervision;To chair/3-in-1 Details for Transfer Assistance: verbal cues for safety, hand placement, backing up to surface Ambulation/Gait Ambulation/Gait Assistance: 5: Supervision Ambulation Distance (Feet): 250 Feet Assistive device: Rolling walker Ambulation/Gait Assistance Details: VCs safety Gait Pattern: Step-through pattern;Trunk flexed    Exercises     PT Diagnosis: Difficulty walking  PT Problem List: Decreased mobility PT Treatment Interventions: Gait training;Functional mobility training;Therapeutic activities;Therapeutic exercise;Patient/family education     PT Goals(Current goals can be found in the care plan section) Acute Rehab PT  Goals Patient Stated Goal: get on home PT Goal Formulation: With patient Time For Goal Achievement: 08/01/13 Potential to Achieve Goals: Good  Visit Information  Last PT Received On: 07/18/13 Assistance Needed: +1 History of Present Illness: Patient is a 79 y.o. female presenting with abdominal pain       Prior Functioning  Home Living Family/patient expects to be discharged to:: Private residence    Cognition  Cognition Arousal/Alertness: Awake/alert Behavior During Therapy: WFL for tasks assessed/performed Overall Cognitive Status: Within Functional Limits for tasks assessed    Extremity/Trunk Assessment Upper Extremity Assessment Upper Extremity Assessment: Overall WFL for tasks assessed Lower Extremity Assessment Lower Extremity Assessment: Generalized weakness Cervical / Trunk Assessment Cervical / Trunk Assessment: Normal   Balance Balance Balance Assessed: Yes Dynamic Standing Balance Dynamic Standing - Level of Assistance: 4: Min assist  End of Session PT - End of Session Activity Tolerance: Patient tolerated treatment well Patient left: in chair;with call bell/phone within reach  GP     Megan [name] Alert, MPT Pager: [phone]

## 2013-07-18 NOTE — Progress Notes (Signed)
Seen, agree with above.   Start clears and advance as tolerated.  Pain resolved.  No pain meds given in over 24 hours.

## 2013-07-18 NOTE — Progress Notes (Signed)
Occupational Therapy Treatment Patient Details Name: Megan [name] MRN: [id number] DOB: 1927-01-02 Today's Date: 07/18/2013 Time: 1027-2536 OT Time Calculation (min): 14 min  OT Assessment / Plan / Recommendation  History of present illness Patient is a 79 y.o. female presenting with abdominal pain   OT comments  Pt tolerated session up to [facility] without any complaint of pain. She is impulsive and has to be reminded of bringing IV pole along with functional transfers.   Follow Up Recommendations  Home health OT;Supervision/Assistance - 24 hour;SNF;Other (comment)    Barriers to Discharge       Equipment Recommendations  None recommended by OT    Recommendations for Other Services    Frequency Min 2X/week   Progress towards OT Goals Progress towards OT goals: Progressing toward goals  Plan      Precautions / Restrictions Precautions Precautions: Fall Restrictions Weight Bearing Restrictions: No   Pertinent Vitals/Pain No complaint of    ADL  Grooming: Performed;Wash/dry hands;Min guard Where Assessed - Grooming: Unsupported standing Toilet Transfer: Performed;Minimal assistance Toilet Transfer Method: Other (comment) (step to [facility] across the room. pt impulsive) Acupuncturist: Bedside commode Toileting - Clothing Manipulation and Hygiene: Performed;Minimal assistance Where Assessed - Toileting Clothing Manipulation and Hygiene: Sit to stand from 3-in-1 or toilet ADL Comments: Pt moves quickly and is a little impulsive. She didnt indicate that she was going to get up from the recliner to use the [facility] and started for it with no awarenss of leaving IV pole behind. Pt states she wanted to show OT how she could transfer on and off commode. Explained that pt needs to let staff know when she is ready to get up and wait for assist. Stood at the sink after toileting to wash hands. Reminded pt to call for assist if she needs to get up and pt verbalized understanding of call  button use.    OT Diagnosis:    OT Problem List:   OT Treatment Interventions:     OT Goals(current goals can now be found in the care plan section)    Visit Information  Last OT Received On: 07/18/13 Assistance Needed: +1 History of Present Illness: Patient is a 79 y.o. female presenting with abdominal pain    Subjective Data      Prior Functioning       Cognition  Cognition Arousal/Alertness: Awake/alert Behavior During Therapy: WFL for tasks assessed/performed Overall Cognitive Status: History of cognitive impairments - at baseline    Mobility  Transfers Transfers: Sit to Stand;Stand to Sit Sit to Stand: 4: Min guard;With upper extremity assist;From chair/3-in-1 Stand to Sit: 4: Min guard;With upper extremity assist;To chair/3-in-1 Details for Transfer Assistance: verbal cues for safety, hand placement, backing up to surface    Exercises      Balance Balance Balance Assessed: Yes Dynamic Standing Balance Dynamic Standing - Level of Assistance: 4: Min assist   End of Session OT - End of Session Activity Tolerance: Patient tolerated treatment well Patient left: in chair;with call bell/phone within reach  GO     Megan [name] [phone] 07/18/2013, 10:41 AM

## 2013-07-18 NOTE — Progress Notes (Signed)
INITIAL NUTRITION ASSESSMENT  DOCUMENTATION CODES Per approved criteria  -Non-severe (moderate) malnutrition in the context of chronic illness   INTERVENTION: Add Ensure Pudding po TID, each supplement provides 170 kcal and 4 grams of protein.  RD to continue to follow nutrition care plan.  NUTRITION DIAGNOSIS: Inadequate oral intake related to limited appetite as evidenced by variable oral intake.   Goal: Diet advancement as tolerated. Intake to meet >90% of estimated nutrition needs.  Monitor:  weight trends, lab trends, I/O's, PO intake, supplement tolerance  Reason for Assessment: Malnutrition Screening Tool  79 y.o. female  Admitting Dx: Abdominal pain, bilateral lower quadrant  ASSESSMENT: PMHx significant for DM, HTN, HLD, hypothyroidism, and previous bowel surgeries. Admitted with abdominal pain. Work-up ongoing.  Pt reported severe pain on admission. Patient's pain is now resolved. She reports that she is hungry and would like to go home. Pt with dementia at baseline; currently pleasantly confused. Just upgraded to full liquids; states that she consumed all of her clear liquid breakfast tray.  Pt with ongoing weight loss and moderate muscle and fat mass loss in temples and clavicles. Unable to complete full physical assessment of patient at this time. Pt meets criteria for moderate MALNUTRITION in the context of chronic illness as evidenced by 19% wt loss x 1 year and moderate muscle and fat mass loss.  Potassium now WNL.  Height: Ht Readings from Last 1 Encounters:  07/17/13 5\' 5"  (1.651 m)    Weight: Wt Readings from Last 1 Encounters:  07/18/13 114 lb 3.2 oz (51.801 kg)    Ideal Body Weight: 125 lb  % Ideal Body Weight: 91%  Wt Readings from Last 10 Encounters:  07/18/13 114 lb 3.2 oz (51.801 kg)  03/01/13 121 lb 4 oz (54.999 kg)  07/08/12 125 lb 9.6 oz (56.972 kg)  06/16/12 133 lb (60.328 kg)  06/10/12 140 lb 6.9 oz (63.7 kg)  06/10/12 140 lb 6.9 oz  (63.7 kg)  05/02/12 143 lb 1.6 oz (64.91 kg)  03/31/12 160 lb (72.576 kg)  03/31/12 160 lb (72.576 kg)    Usual Body Weight: 81%  % Usual Body Weight: 140 lb  BMI:  Body mass index is 19 kg/(m^2). WNL  Estimated Nutritional Needs: Kcal: 1225 - 1350 Protein: 52 - 62 g Fluid: 1.2 - 1.4 liters  Skin: intact  Diet Order: Clear Liquid  EDUCATION NEEDS: -No education needs identified at this time   Intake/Output Summary (Last 24 hours) at 07/18/13 1103 Last data filed at 07/18/13 0600  Gross per 24 hour  Intake  892.5 ml  Output    350 ml  Net  542.5 ml    Last BM: 9/14  Labs:   Recent Labs Lab 07/17/13 0529 07/17/13 1035 07/18/13 0525  NA 138  --  139  K 3.4*  --  4.1  CL 99  --  105  CO2  --   --  28  BUN 20  --  15  CREATININE 1.20* 0.84 0.75  CALCIUM  --   --  9.4  GLUCOSE 167*  --  115*    CBG (last 3)   Recent Labs  07/17/13 2345 07/18/13 0340 07/18/13 0836  GLUCAP 97 98 107*    Scheduled Meds: . docusate sodium  100 mg Oral BID  . donepezil  10 mg Oral Daily  . enoxaparin (LOVENOX) injection  40 mg Subcutaneous Q24H  . insulin aspart  0-9 Units Subcutaneous TID WC  . latanoprost  1 drop Both  Eyes QHS  . lisinopril  40 mg Oral Daily  .  morphine injection  4 mg Intravenous Once  . pantoprazole  40 mg Oral Daily  . potassium chloride SA  20 mEq Oral BID  . sodium chloride  3 mL Intravenous Q12H    Continuous Infusions: . 0.9 % NaCl with KCl 40 mEq / L 75 mL/hr at 07/18/13 0521  . sodium chloride 0.9 % 1,000 mL with potassium chloride 40 mEq infusion      Past Medical History  Diagnosis Date  . Hypertension   . Diastolic dysfunction, left ventricle by ECHO 2011 03/25/2012  . Hypothyroidism 03/25/2012  . High cholesterol   . Type II diabetes mellitus   . H/O hiatal hernia   . Duodenal perforation 03/23/12  . Arthritis     "in my back"    Past Surgical History  Procedure Laterality Date  . Bladder tacked    . Repair perforated  ulcer  03/23/12  . Diagnostic laparoscopy  03/25/12    REPAIR OF PERFORATED ULCER with omental patch  . Abcess drainage  03/25/12     of abdominal & pelvic abscesses  . Laparoscopy  03/24/2012    Procedure: LAPAROSCOPY DIAGNOSTIC;  Surgeon: Ardeth Sportsman, MD;  Location: [facility] OR;  Service: General;  Laterality: N/A;  drainage of abdominal and pelvic abcesses  . Laparotomy  06/03/2012    Procedure: EXPLORATORY LAPAROTOMY;  Surgeon: Mariella Saa, MD;  Location: [facility] OR;  Service: General;  Laterality: N/A;    Jarold Motto MS, RD, LDN Pager: [phone] After-hours pager: [phone]

## 2013-07-18 NOTE — Progress Notes (Signed)
Subjective: No further pain, she says she is hungry and wants to go home. + flatus, no BM Objective: Vital signs in last 24 hours: Temp:  [97.5 F (36.4 C)-98.2 F (36.8 C)] 97.9 F (36.6 C) (09/16 0341) Pulse Rate:  [44-52] 52 (09/16 0341) Resp:  [12-20] 16 (09/16 0341) BP: (141-197)/(44-60) 151/44 mmHg (09/16 0341) SpO2:  [100 %] 100 % (09/16 0341) Weight:  [51.801 kg (114 lb 3.2 oz)-54 kg (119 lb 0.8 oz)] 51.801 kg (114 lb 3.2 oz) (09/16 0341) Last BM Date: 07/16/13 I/O=?  350 urine recorded for 24 hours Afebrile, VSS Labs OK Film ok this AM Diet: NPO except sips Intake/Output from previous day: 09/15 0701 - 09/16 0700 In: 892.5 [I.V.:892.5] Out: 350 [Urine:350] Intake/Output this shift:    General appearance: alert, cooperative and no distress Resp: clear to auscultation bilaterally GI: soft, non-tender; bowel sounds normal; no masses,  no organomegaly  Lab Results:   Recent Labs  07/17/13 0511 07/17/13 0529 07/18/13 0525  WBC 6.6  --  6.0  HGB 13.8 15.0 12.4  HCT 41.3 44.0 37.5  PLT 122*  --  131*    BMET  Recent Labs  07/17/13 0529 07/17/13 1035 07/18/13 0525  NA 138  --  139  K 3.4*  --  4.1  CL 99  --  105  CO2  --   --  28  GLUCOSE 167*  --  115*  BUN 20  --  15  CREATININE 1.20* 0.84 0.75  CALCIUM  --   --  9.4   PT/INR  Recent Labs  07/17/13 1035  LABPROT 13.4  INR 1.04     Recent Labs Lab 07/17/13 0511  AST 18  ALT 8  ALKPHOS 86  BILITOT 0.6  PROT 7.1  ALBUMIN 3.3*     Lipase     Component Value Date/Time   LIPASE 20 07/17/2013 0511     Studies/Results: Ct Abdomen Pelvis Wo Contrast  07/17/2013   *RADIOLOGY REPORT*  Clinical Data: Bilateral lower abdominal pain and hematuria.  Small bowel obstruction 1 year ago.  Diabetic hypertensive patient with history of hiatal hernia and prior surgery for perforated duodenal ulcer, abscess drainage and bladder tact procedure.  CT ABDOMEN AND PELVIS WITHOUT CONTRAST   Technique:  Multidetector CT imaging of the abdomen and pelvis was performed following the standard protocol without intravenous contrast.  Comparison: Plain film examination 07/17/2013.  CT abdomen pelvis 05/28/2012.  Findings: Swirling pattern of the small bowel mesentery (and contain vessels) directed to the right lower quadrant with mild hazy infiltration of mesenteric fat planes which may indicate mild inflammation. Lax mesentery and rotation may contribute to this inflammation.  Small bowel loops are fluid filled and normal to top normal in size.  No CT findings of appendicitis.  Scattered diverticula most notable left colon/sigmoid colon without evidence of diverticulitis.  No CT evidence of recurrent duodenal ulcer or bowel perforation.  Anterior abdominal wall small fatty containing hernia.  Prominent atherosclerotic type changes with coronary artery calcifications and calcification with ectasia of the abdominal aorta (measuring up to 2.5 cm) and iliac arteries (common iliac artery measuring up to 1.5 cm).  Atherosclerotic type changes femoral arteries and aortic branch vessels.  Mild fullness of the proximal right renal collecting system without point of obstruction identified.  Complex bilateral renal cysts with coarse peripheral calcifications similar to the prior exam.  Evaluation of solid abdominal viscera is limited by lack of IV contrast.  Taking this limitation into  account no worrisome hepatic, splenic, pancreatic or adrenal lesion.  Marked degenerative changes L3-4, L4-5 and L5-S1.  Bilateral hip joint degenerative changes.  Minimal scarring lung bases.  Heart size top normal to slightly enlarged.  IMPRESSION: Swirling pattern of the small bowel mesentery (and contained vessels) directed to the right lower quadrant with mild hazy infiltration of mesenteric fat planes which may indicate mild inflammation. Lax mesentery and rotation may contribute to this inflammation.  Small bowel loops are fluid  filled and normal to top normal in size.  Scattered diverticula most notable left colon/sigmoid colon without evidence of diverticulitis.  Mild fullness of the proximal right renal collecting system without point of obstruction identified.  Complex bilateral renal cysts with coarse peripheral calcifications similar to the prior exam.  Atherosclerotic type changes as detailed above.   Original Report Authenticated By: Lacy Duverney, M.D.   Dg Abd Acute W/chest  07/17/2013   CLINICAL DATA:  Pain.  Low abdominal pain.  EXAM: ACUTE ABDOMEN SERIES (ABDOMEN 2 VIEW & CHEST 1 VIEW)  COMPARISON:  02/27/2013.  FINDINGS: Cardiomegaly, chronic. Aortic atherosclerosis and tortuosity. No infiltrate, edema, effusion, or pneumothorax.  Nonobstructive bowel gas pattern. Formed stool present in the ascending and descending colon. No pneumoperitoneum. No suspicious calcifications.  Diffuse advanced degenerative disc disease. Hip osteoarthritis. SI joint osteoarthritis with chronic right ilium sclerosis.  IMPRESSION: 1.  Nonobstructive bowel gas pattern. No pneumoperitoneum.  2.  No evidence of acute cardiopulmonary disease.   Electronically Signed   By: Tiburcio Pea   On: 07/17/2013 05:47    Medications: . docusate sodium  100 mg Oral BID  . donepezil  10 mg Oral Daily  . enoxaparin (LOVENOX) injection  40 mg Subcutaneous Q24H  . insulin aspart  0-9 Units Subcutaneous TID WC  . latanoprost  1 drop Both Eyes QHS  . lisinopril  40 mg Oral Daily  .  morphine injection  4 mg Intravenous Once  . pantoprazole  40 mg Oral Daily  . potassium chloride SA  20 mEq Oral BID  . sodium chloride  3 mL Intravenous Q12H   Prior to Admission medications   Medication Sig Start Date End Date Taking? Authorizing Provider  amoxicillin (AMOXIL) 500 MG capsule Take 500 mg by mouth 3 (three) times daily. Originally filled 05/29/13 for 24 capsules. As of 07/16/13 still taking "as needed".   Yes Historical Provider, MD  aspirin 325 MG  tablet Take 325 mg by mouth daily as needed for pain. Recently started.   Yes Historical Provider, MD  bisoprolol-hydrochlorothiazide (ZIAC) 5-6.25 MG per tablet Take 1 tablet by mouth daily.   Yes Historical Provider, MD  cephALEXin (KEFLEX) 500 MG capsule Take 500 mg by mouth 4 (four) times daily. Originally filled 06/09/13. 15 day course. Still taking "as needed".   Yes Historical Provider, MD  Cholecalciferol (VITAMIN D) 2000 UNITS tablet Take 2,000 Units by mouth daily.   Yes Historical Provider, MD  donepezil (ARICEPT) 10 MG tablet Take 10 mg by mouth daily.    Yes Historical Provider, MD  furosemide (LASIX) 40 MG tablet Take 40 mg by mouth daily as needed for fluid or edema.    Yes Historical Provider, MD  lisinopril (PRINIVIL,ZESTRIL) 40 MG tablet Take 40 mg by mouth daily.   Yes Historical Provider, MD  pantoprazole (PROTONIX) 40 MG tablet Take 40 mg by mouth daily.   Yes Kathlen Mody, MD  potassium chloride SA (K-DUR,KLOR-CON) 20 MEQ tablet Take 20 mEq by mouth 2 (two) times daily.  Yes Historical Provider, MD  Travoprost, BAK Free, (TRAVATAN) 0.004 % SOLN ophthalmic solution Place 1 drop into both eyes at bedtime.   Yes Historical Provider, MD     Assessment/Plan Abdominal pain with abnormal CT scan S/p perforated duodenal ulcer 03/2012 and SBO with exploratory lap/lysis of adhesions 06/2012. Hx of DCHF Hypertension AODM Hypothyroid dyslipidemia   Plan:  Clear liquids and see how she does.    LOS: 1 day    Mozes Sagar 07/18/2013

## 2013-07-18 NOTE — Discharge Summary (Signed)
Physician Discharge Summary  Megan [name] UJW:[id number] DOB: 1927/10/17 DOA: 07/17/2013  PCP: [name], MD  Admit date: 07/17/2013 Discharge date: 07/18/2013  Time spent: 45 minutes  Recommendations for Outpatient Follow-up:  -Advised to follow up with PCP in 2 weeks.   Discharge Diagnoses:  Principal Problem:   Abdominal pain, bilateral lower quadrant Active Problems:   DM (diabetes mellitus), type 2 with complications   Hypertension   Diastolic dysfunction, left ventricle by ECHO 2011   Hypothyroidism   Hypokalemia   Dementia   Debility   Bradycardia   Discharge Condition: Stable and improved  Filed Weights   07/17/13 0947 07/18/13 0341  Weight: 54 kg (119 lb 0.8 oz) 51.801 kg (114 lb 3.2 oz)    History of present illness:  Megan [name] is a 79 y.o. female  With history hx DM, HTN, hyperlipidemia, hypothyroidism, prior hx of duodenal perforation status post laparoscopic closure per Dr. Michaell Cowing May of 2013, SBO with subsequent expiratory laparotomy by Dr. Johna Sheriff 06/03/2012 with lysis of adhesions at that time, dementia who presents to the ED with several hour history of bilateral lower intermittent abdominal pain which is excruciating which patient states is worse than childbirth and she's had 11 children. Patient states abdominal pain started the night prior to admission and she describes it as a heavy pain which is non-radiating and intermittent in nature. Patient denies any fevers, no chills, no nausea, no vomiting, no diarrhea, no constipation, no chest pain, no shortness of breath, no melena, no hematemesis, no hematochezia. Patient stated had a prior history of constipation however that has since resolved. Patient has been given pain medication in the ED including fentanyl.  Patient was seen in the ED basic metabolic profile obtained at a potassium of 3.4 creatinine 1.20 otherwise was within normal limits. Hepatic panel and albumin of 3.3 otherwise was within  normal limits. CBC was unremarkable. Patient noted to be bradycardic on telemetry in the 40s to 50s. Acute abdominal series was unremarkable. CT of the abdomen and pelvis showed a swelling pattern of the small bowel mesentery and contained vessels director to the right lower quadrant with mild hazy infiltration of mesenteric fat planes which may indicate mild inflammation. Lacks mesentery and rotation may contribute to this inflammation. Small bowel loops are fluid-filled and normal to top normal in size. Scattered diverticula are noted without evidence of diverticulitis. Mild fullness of the proximal right renal collecting system without obstruction. Complex bilateral renal cysts with coarse peripheral calcifications. Negative for appendicitis.  We were called to admit the patient for further evaluation and management.   Hospital Course:  Abdominal Pain -Resolved. -Etiology unclear. -Has been evaluated by surgery. -Has tolerated a solid diet. -No further work up anticipated.  Rest of chronic medical conditions have been stable.  Procedures:  None   Consultations:  None  Discharge Instructions  Discharge Orders   Future Orders Complete By Expires   Diet - low sodium heart healthy  As directed    Discontinue IV  As directed    Increase activity slowly  As directed        Medication List    STOP taking these medications       amoxicillin 500 MG capsule  Commonly known as:  AMOXIL     cephALEXin 500 MG capsule  Commonly known as:  KEFLEX     furosemide 40 MG tablet  Commonly known as:  LASIX      TAKE these medications  aspirin 325 MG tablet  Take 325 mg by mouth daily as needed for pain. Recently started.     bisoprolol-hydrochlorothiazide 5-6.25 MG per tablet  Commonly known as:  ZIAC  Take 1 tablet by mouth daily.     donepezil 10 MG tablet  Commonly known as:  ARICEPT  Take 10 mg by mouth daily.     lisinopril 40 MG tablet  Commonly known as:   PRINIVIL,ZESTRIL  Take 40 mg by mouth daily.     pantoprazole 40 MG tablet  Commonly known as:  PROTONIX  Take 40 mg by mouth daily.     potassium chloride SA 20 MEQ tablet  Commonly known as:  K-DUR,KLOR-CON  Take 20 mEq by mouth 2 (two) times daily.     Travoprost (BAK Free) 0.004 % Soln ophthalmic solution  Commonly known as:  TRAVATAN  Place 1 drop into both eyes at bedtime.     Vitamin D 2000 UNITS tablet  Take 2,000 Units by mouth daily.       No Known Allergies     Follow-up Information   Follow up with MCKEOWN,WILLIAM DAVID, MD. Schedule an appointment as soon as possible for a visit in 2 weeks.   Specialty:  Internal Medicine   Contact information:   [address] [place] [address] [phone]        The results of significant diagnostics from this hospitalization (including imaging, microbiology, ancillary and laboratory) are listed below for reference.    Significant Diagnostic Studies: Ct Abdomen Pelvis Wo Contrast  07/17/2013   *RADIOLOGY REPORT*  Clinical Data: Bilateral lower abdominal pain and hematuria.  Small bowel obstruction 1 year ago.  Diabetic hypertensive patient with history of hiatal hernia and prior surgery for perforated duodenal ulcer, abscess drainage and bladder tact procedure.  CT ABDOMEN AND PELVIS WITHOUT CONTRAST  Technique:  Multidetector CT imaging of the abdomen and pelvis was performed following the standard protocol without intravenous contrast.  Comparison: Plain film examination 07/17/2013.  CT abdomen pelvis 05/28/2012.  Findings: Swirling pattern of the small bowel mesentery (and contain vessels) directed to the right lower quadrant with mild hazy infiltration of mesenteric fat planes which may indicate mild inflammation. Lax mesentery and rotation may contribute to this inflammation.  Small bowel loops are fluid filled and normal to top normal in size.  No CT findings of appendicitis.  Scattered diverticula most  notable left colon/sigmoid colon without evidence of diverticulitis.  No CT evidence of recurrent duodenal ulcer or bowel perforation.  Anterior abdominal wall small fatty containing hernia.  Prominent atherosclerotic type changes with coronary artery calcifications and calcification with ectasia of the abdominal aorta (measuring up to 2.5 cm) and iliac arteries (common iliac artery measuring up to 1.5 cm).  Atherosclerotic type changes femoral arteries and aortic branch vessels.  Mild fullness of the proximal right renal collecting system without point of obstruction identified.  Complex bilateral renal cysts with coarse peripheral calcifications similar to the prior exam.  Evaluation of solid abdominal viscera is limited by lack of IV contrast.  Taking this limitation into account no worrisome hepatic, splenic, pancreatic or adrenal lesion.  Marked degenerative changes L3-4, L4-5 and L5-S1.  Bilateral hip joint degenerative changes.  Minimal scarring lung bases.  Heart size top normal to slightly enlarged.  IMPRESSION: Swirling pattern of the small bowel mesentery (and contained vessels) directed to the right lower quadrant with mild hazy infiltration of mesenteric fat planes which may indicate mild inflammation. Lax mesentery and rotation may  contribute to this inflammation.  Small bowel loops are fluid filled and normal to top normal in size.  Scattered diverticula most notable left colon/sigmoid colon without evidence of diverticulitis.  Mild fullness of the proximal right renal collecting system without point of obstruction identified.  Complex bilateral renal cysts with coarse peripheral calcifications similar to the prior exam.  Atherosclerotic type changes as detailed above.   Original Report Authenticated By: Lacy Duverney, M.D.   Dg Abd Acute W/chest  07/17/2013   CLINICAL DATA:  Pain.  Low abdominal pain.  EXAM: ACUTE ABDOMEN SERIES (ABDOMEN 2 VIEW & CHEST 1 VIEW)  COMPARISON:  02/27/2013.  FINDINGS:  Cardiomegaly, chronic. Aortic atherosclerosis and tortuosity. No infiltrate, edema, effusion, or pneumothorax.  Nonobstructive bowel gas pattern. Formed stool present in the ascending and descending colon. No pneumoperitoneum. No suspicious calcifications.  Diffuse advanced degenerative disc disease. Hip osteoarthritis. SI joint osteoarthritis with chronic right ilium sclerosis.  IMPRESSION: 1.  Nonobstructive bowel gas pattern. No pneumoperitoneum.  2.  No evidence of acute cardiopulmonary disease.   Electronically Signed   By: Tiburcio Pea   On: 07/17/2013 05:47   Dg Abd Portable 2v  07/18/2013   CLINICAL DATA:  Abdominal pain and abnormal CT of the abdomen.  EXAM: PORTABLE ABDOMEN - 2 VIEW  COMPARISON:  07/17/2013 abdominal films and CT.  FINDINGS: The bowel gas pattern is normal. There is no evidence of free air. No radio-opaque calculi or other significant radiographic abnormality is seen. Degenerative changes are present of the spine.  IMPRESSION: Normal bowel gas pattern.   Electronically Signed   By: Irish Lack   On: 07/18/2013 07:53    Microbiology: No results found for this or any previous visit (from the past 240 hour(s)).   Labs: Basic Metabolic Panel:  Recent Labs Lab 07/17/13 0529 07/17/13 1035 07/18/13 0525  NA 138  --  139  K 3.4*  --  4.1  CL 99  --  105  CO2  --   --  28  GLUCOSE 167*  --  115*  BUN 20  --  15  CREATININE 1.20* 0.84 0.75  CALCIUM  --   --  9.4   Liver Function Tests:  Recent Labs Lab 07/17/13 0511  AST 18  ALT 8  ALKPHOS 86  BILITOT 0.6  PROT 7.1  ALBUMIN 3.3*    Recent Labs Lab 07/17/13 0511  LIPASE 20   No results found for this basename: AMMONIA,  in the last 168 hours CBC:  Recent Labs Lab 07/17/13 0511 07/17/13 0529 07/18/13 0525  WBC 6.6  --  6.0  NEUTROABS 2.9  --   --   HGB 13.8 15.0 12.4  HCT 41.3 44.0 37.5  MCV 89.0  --  89.1  PLT 122*  --  131*   Cardiac Enzymes: No results found for this basename:  CKTOTAL, CKMB, CKMBINDEX, TROPONINI,  in the last 168 hours BNP: BNP (last 3 results) No results found for this basename: PROBNP,  in the last 8760 hours CBG:  Recent Labs Lab 07/17/13 2000 07/17/13 2345 07/18/13 0340 07/18/13 0836 07/18/13 1152  GLUCAP 110* 97 98 107* 95       Signed:  HERNANDEZ ACOSTA,ESTELA  [facility] Pager: [phone] 07/18/2013, 1:15 PM

## 2013-07-18 NOTE — Progress Notes (Signed)
Patient discharge to home, daughter at bedside. PIV removed no s/s of swelling or infiltration. Discharge instructions and follow up appointments done and was given to the patient. No complaints of any pain or discomfort upon discharge.

## 2013-08-25 ENCOUNTER — Emergency Department (HOSPITAL_COMMUNITY): Payer: Medicare Other

## 2013-08-25 ENCOUNTER — Inpatient Hospital Stay (HOSPITAL_COMMUNITY)
Admission: EM | Admit: 2013-08-25 | Discharge: 2013-08-29 | DRG: 689 | Disposition: A | Discharge status: Skilled Nursing Facility | Payer: Medicare Other | Attending: Internal Medicine | Admitting: Internal Medicine

## 2013-08-25 ENCOUNTER — Encounter (HOSPITAL_COMMUNITY): Payer: Self-pay | Admitting: Emergency Medicine

## 2013-08-25 DIAGNOSIS — G9340 Encephalopathy, unspecified: Secondary | ICD-10-CM

## 2013-08-25 DIAGNOSIS — E860 Dehydration: Secondary | ICD-10-CM

## 2013-08-25 DIAGNOSIS — M479 Spondylosis, unspecified: Secondary | ICD-10-CM | POA: Diagnosis present

## 2013-08-25 DIAGNOSIS — E119 Type 2 diabetes mellitus without complications: Secondary | ICD-10-CM | POA: Diagnosis present

## 2013-08-25 DIAGNOSIS — F17200 Nicotine dependence, unspecified, uncomplicated: Secondary | ICD-10-CM | POA: Diagnosis present

## 2013-08-25 DIAGNOSIS — N390 Urinary tract infection, site not specified: Principal | ICD-10-CM

## 2013-08-25 DIAGNOSIS — R5381 Other malaise: Secondary | ICD-10-CM | POA: Diagnosis present

## 2013-08-25 DIAGNOSIS — IMO0002 Reserved for concepts with insufficient information to code with codable children: Secondary | ICD-10-CM

## 2013-08-25 DIAGNOSIS — E876 Hypokalemia: Secondary | ICD-10-CM | POA: Diagnosis not present

## 2013-08-25 DIAGNOSIS — J9600 Acute respiratory failure, unspecified whether with hypoxia or hypercapnia: Secondary | ICD-10-CM

## 2013-08-25 DIAGNOSIS — Z7982 Long term (current) use of aspirin: Secondary | ICD-10-CM

## 2013-08-25 DIAGNOSIS — K5900 Constipation, unspecified: Secondary | ICD-10-CM | POA: Diagnosis not present

## 2013-08-25 DIAGNOSIS — E1129 Type 2 diabetes mellitus with other diabetic kidney complication: Secondary | ICD-10-CM | POA: Diagnosis present

## 2013-08-25 DIAGNOSIS — G929 Unspecified toxic encephalopathy: Secondary | ICD-10-CM | POA: Diagnosis present

## 2013-08-25 DIAGNOSIS — Z23 Encounter for immunization: Secondary | ICD-10-CM

## 2013-08-25 DIAGNOSIS — Z79899 Other long term (current) drug therapy: Secondary | ICD-10-CM

## 2013-08-25 DIAGNOSIS — R4182 Altered mental status, unspecified: Secondary | ICD-10-CM

## 2013-08-25 DIAGNOSIS — R1031 Right lower quadrant pain: Secondary | ICD-10-CM

## 2013-08-25 DIAGNOSIS — R404 Transient alteration of awareness: Secondary | ICD-10-CM

## 2013-08-25 DIAGNOSIS — R509 Fever, unspecified: Secondary | ICD-10-CM

## 2013-08-25 DIAGNOSIS — F0150 Vascular dementia without behavioral disturbance: Secondary | ICD-10-CM | POA: Diagnosis present

## 2013-08-25 DIAGNOSIS — E43 Unspecified severe protein-calorie malnutrition: Secondary | ICD-10-CM | POA: Diagnosis present

## 2013-08-25 DIAGNOSIS — I10 Essential (primary) hypertension: Secondary | ICD-10-CM | POA: Diagnosis present

## 2013-08-25 DIAGNOSIS — F0390 Unspecified dementia without behavioral disturbance: Secondary | ICD-10-CM | POA: Diagnosis present

## 2013-08-25 DIAGNOSIS — E7800 Pure hypercholesterolemia, unspecified: Secondary | ICD-10-CM | POA: Diagnosis present

## 2013-08-25 DIAGNOSIS — E039 Hypothyroidism, unspecified: Secondary | ICD-10-CM | POA: Diagnosis present

## 2013-08-25 DIAGNOSIS — G92 Toxic encephalopathy: Secondary | ICD-10-CM | POA: Diagnosis present

## 2013-08-25 DIAGNOSIS — E118 Type 2 diabetes mellitus with unspecified complications: Secondary | ICD-10-CM

## 2013-08-25 DIAGNOSIS — K219 Gastro-esophageal reflux disease without esophagitis: Secondary | ICD-10-CM | POA: Diagnosis present

## 2013-08-25 DIAGNOSIS — R410 Disorientation, unspecified: Secondary | ICD-10-CM

## 2013-08-25 LAB — COMPREHENSIVE METABOLIC PANEL
ALT: 11 U/L (ref 0–35)
AST: 23 U/L (ref 0–37)
CO2: 25 mEq/L (ref 19–32)
Calcium: 10 mg/dL (ref 8.4–10.5)
Chloride: 98 mEq/L (ref 96–112)
Creatinine, Ser: 0.62 mg/dL (ref 0.50–1.10)
GFR calc Af Amer: >90 mL/min (ref >90)
GFR calc non Af Amer: 81 mL/min — ABNORMAL LOW (ref >90)
Glucose, Bld: 126 mg/dL — ABNORMAL HIGH (ref 70–99)
Total Bilirubin: 0.8 mg/dL (ref 0.3–1.2)

## 2013-08-25 LAB — GRAM STAIN

## 2013-08-25 LAB — CBC WITH DIFFERENTIAL/PLATELET
Basophils Relative: 0 % (ref 0–1)
Eosinophils Absolute: 0 10*3/uL (ref 0.0–0.7)
Eosinophils Relative: 0 % (ref 0–5)
HCT: 43.1 % (ref 36.0–46.0)
Hemoglobin: 14.7 g/dL (ref 12.0–15.0)
Lymphs Abs: 1.3 10*3/uL (ref 0.7–4.0)
MCH: 29.9 pg (ref 26.0–34.0)
MCHC: 34.1 g/dL (ref 30.0–36.0)
MCV: 87.8 fL (ref 78.0–100.0)
Monocytes Absolute: 0.4 10*3/uL (ref 0.1–1.0)
Neutro Abs: 6.2 10*3/uL (ref 1.7–7.7)
RBC: 4.91 MIL/uL (ref 3.87–5.11)
Smear Review: ADEQUATE

## 2013-08-25 LAB — CSF CELL COUNT WITH DIFFERENTIAL
RBC Count, CSF: 400 /mm3 — ABNORMAL HIGH
Tube #: 1
WBC, CSF: 2 /mm3 (ref 0–5)

## 2013-08-25 LAB — URINALYSIS W MICROSCOPIC + REFLEX CULTURE
Bilirubin Urine: NEGATIVE
Nitrite: NEGATIVE
Protein, ur: 100 mg/dL — AB
Specific Gravity, Urine: 1.025 (ref 1.005–1.030)
Urobilinogen, UA: 1 mg/dL (ref 0.0–1.0)

## 2013-08-25 LAB — CBC
Hemoglobin: 13 g/dL (ref 12.0–15.0)
MCH: 30.2 pg (ref 26.0–34.0)
MCV: 88.8 fL (ref 78.0–100.0)
Platelets: 131 10*3/uL — ABNORMAL LOW (ref 150–400)
RBC: 4.3 MIL/uL (ref 3.87–5.11)
WBC: 7.8 10*3/uL (ref 4.0–10.5)

## 2013-08-25 LAB — PROTEIN AND GLUCOSE, CSF
Glucose, CSF: 70 mg/dL (ref 43–76)
Total  Protein, CSF: 57 mg/dL — ABNORMAL HIGH (ref 15–45)

## 2013-08-25 LAB — CREATININE, SERUM
Creatinine, Ser: 0.59 mg/dL (ref 0.50–1.10)
GFR calc Af Amer: >90 mL/min (ref >90)

## 2013-08-25 LAB — AMMONIA: Ammonia: 33 umol/L (ref 11–60)

## 2013-08-25 LAB — CG4 I-STAT (LACTIC ACID): Lactic Acid, Venous: 1.88 mmol/L (ref 0.5–2.2)

## 2013-08-25 MED ORDER — LATANOPROST 0.005 % OP SOLN
1.0000 [drp] | Freq: Every day | OPHTHALMIC | Status: DC
  Administered 2013-08-25 – 2013-08-28 (×4): 1 [drp] via OPHTHALMIC
  Filled 2013-08-25: qty 2.5

## 2013-08-25 MED ORDER — ENOXAPARIN SODIUM 40 MG/0.4ML ~~LOC~~ SOLN
40.0000 mg | SUBCUTANEOUS | Status: DC
  Administered 2013-08-25: 40 mg via SUBCUTANEOUS
  Filled 2013-08-25 (×2): qty 0.4

## 2013-08-25 MED ORDER — LEVOTHYROXINE SODIUM 50 MCG PO TABS
50.0000 ug | ORAL_TABLET | Freq: Every day | ORAL | Status: DC
  Administered 2013-08-26 – 2013-08-29 (×4): 50 ug via ORAL
  Filled 2013-08-25 (×5): qty 1

## 2013-08-25 MED ORDER — DEXTROSE 5 % IV SOLN
1.0000 g | INTRAVENOUS | Status: DC
  Administered 2013-08-25 – 2013-08-28 (×4): 1 g via INTRAVENOUS
  Filled 2013-08-25 (×5): qty 10

## 2013-08-25 MED ORDER — NITROGLYCERIN 2 % TD OINT
0.5000 [in_us] | TOPICAL_OINTMENT | Freq: Four times a day (QID) | TRANSDERMAL | Status: DC
  Administered 2013-08-25 – 2013-08-26 (×2): 0.5 [in_us] via TOPICAL
  Filled 2013-08-25 (×2): qty 30

## 2013-08-25 MED ORDER — ACETAMINOPHEN 325 MG PO TABS: 650.0000 mg | ORAL_TABLET | Freq: Four times a day (QID) | ORAL | Status: DC | PRN

## 2013-08-25 MED ORDER — LABETALOL HCL 5 MG/ML IV SOLN
10.0000 mg | INTRAVENOUS | Status: DC | PRN
  Filled 2013-08-25: qty 4

## 2013-08-25 MED ORDER — SODIUM CHLORIDE 0.9 % IJ SOLN
3.0000 mL | Freq: Two times a day (BID) | INTRAMUSCULAR | Status: DC
  Administered 2013-08-26 – 2013-08-29 (×5): 3 mL via INTRAVENOUS

## 2013-08-25 MED ORDER — DONEPEZIL HCL 10 MG PO TABS
10.0000 mg | ORAL_TABLET | Freq: Every day | ORAL | Status: DC
  Administered 2013-08-25 – 2013-08-29 (×5): 10 mg via ORAL
  Filled 2013-08-25 (×5): qty 1

## 2013-08-25 MED ORDER — CEFTRIAXONE SODIUM 1 G IJ SOLR
1.0000 g | Freq: Once | INTRAMUSCULAR | Status: AC
  Administered 2013-08-25: 1 g via INTRAVENOUS
  Filled 2013-08-25: qty 10

## 2013-08-25 MED ORDER — ONDANSETRON HCL 4 MG/2ML IJ SOLN: 4.0000 mg | Freq: Four times a day (QID) | INTRAMUSCULAR | Status: DC | PRN

## 2013-08-25 MED ORDER — SORBITOL 70 % SOLN
30.0000 mL | Freq: Every day | Status: DC | PRN
  Filled 2013-08-25: qty 30

## 2013-08-25 MED ORDER — ASPIRIN 325 MG PO TABS
325.0000 mg | ORAL_TABLET | Freq: Every day | ORAL | Status: DC | PRN
  Filled 2013-08-25: qty 1

## 2013-08-25 MED ORDER — ALUM & MAG HYDROXIDE-SIMETH 200-200-20 MG/5ML PO SUSP: 30.0000 mL | Freq: Four times a day (QID) | ORAL | Status: DC | PRN

## 2013-08-25 MED ORDER — PANTOPRAZOLE SODIUM 40 MG PO TBEC
40.0000 mg | DELAYED_RELEASE_TABLET | Freq: Every day | ORAL | Status: DC
  Administered 2013-08-25 – 2013-08-29 (×5): 40 mg via ORAL
  Filled 2013-08-25 (×6): qty 1

## 2013-08-25 MED ORDER — SODIUM CHLORIDE 0.9 % IV SOLN
INTRAVENOUS | Status: DC
  Administered 2013-08-25 – 2013-08-26 (×2): via INTRAVENOUS

## 2013-08-25 MED ORDER — SODIUM CHLORIDE 0.9 % IV SOLN: INTRAVENOUS | Status: DC

## 2013-08-25 MED ORDER — SODIUM CHLORIDE 0.9 % IV BOLUS (SEPSIS)
1000.0000 mL | Freq: Once | INTRAVENOUS | Status: AC
  Administered 2013-08-25: 1000 mL via INTRAVENOUS

## 2013-08-25 MED ORDER — ACETAMINOPHEN 650 MG RE SUPP: 650.0000 mg | Freq: Four times a day (QID) | RECTAL | Status: DC | PRN

## 2013-08-25 MED ORDER — LABETALOL HCL 5 MG/ML IV SOLN
5.0000 mg | Freq: Once | INTRAVENOUS | Status: AC
  Administered 2013-08-25: 5 mg via INTRAVENOUS
  Filled 2013-08-25: qty 4

## 2013-08-25 MED ORDER — ONDANSETRON HCL 4 MG PO TABS: 4.0000 mg | ORAL_TABLET | Freq: Four times a day (QID) | ORAL | Status: DC | PRN

## 2013-08-25 MED ORDER — LISINOPRIL 40 MG PO TABS
40.0000 mg | ORAL_TABLET | Freq: Every day | ORAL | Status: DC
  Administered 2013-08-25 – 2013-08-29 (×5): 40 mg via ORAL
  Filled 2013-08-25 (×5): qty 1

## 2013-08-25 MED ORDER — ACETAMINOPHEN 650 MG RE SUPP
650.0000 mg | Freq: Once | RECTAL | Status: AC
  Administered 2013-08-25: 650 mg via RECTAL
  Filled 2013-08-25: qty 1

## 2013-08-25 MED ORDER — VANCOMYCIN HCL IN DEXTROSE 750-5 MG/150ML-% IV SOLN
750.0000 mg | INTRAVENOUS | Status: AC
  Administered 2013-08-25: 750 mg via INTRAVENOUS
  Filled 2013-08-25: qty 150

## 2013-08-25 MED ORDER — VANCOMYCIN HCL 500 MG IV SOLR
500.0000 mg | Freq: Two times a day (BID) | INTRAVENOUS | Status: DC
  Administered 2013-08-26 – 2013-08-27 (×4): 500 mg via INTRAVENOUS
  Filled 2013-08-25 (×7): qty 500

## 2013-08-25 NOTE — H&P (Signed)
[facility] History and Physical  JULIANNAH [name] ZOX:[id number] DOB: 03-07-1927 DOA: 08/25/2013  Referring physician:  PCP: [name], MD  Specialists: Neurology  Chief Complaint: Mental status changes  HPI: Megan [name] is a 79 y.o. female with a past medical history of cognitive impairment who was recently discharged from the medicine service on 07/17/2013 at which time patient was worked up for abdominal pain. Symptoms  resolving, unclear etiology. She was brought in today for significant mental status changes. Family members stating she had a good day yesterday, feeling well, interactive, when out shopping with her daughters. At nighttime her son stated talking to her over the telephone, describing her as "not sounding right." Patient seemed to be more confused and disoriented. This morning patient having a steep decline, when she did not come down for breakfast, family found her in her room nonverbal, unable to follow commands, having generalized weakness as she did not get out of bed. EMS was called, found her in bed covered in urine. Family members reporting no recent medication changes. In the emergency department she was found to be febrile having a temperature of 102.4, coming down to 101. Initial workup in the emergency department was unremarkable, with chest x-ray not show an obvious infiltrate, urinalysis showing small amount of leukocytes with rare bacteria. Serum lactate was within normal limits. She was administered a dose of ceftriaxone. I discussed case with neurology, requested a consultation. With lack of obvious source of infection, lumbar puncture was performed in emergent department.                                                       Review of Systems: Unable to obtain given mental status changes  Past Medical History  Diagnosis Date  . Hypertension   . Diastolic dysfunction, left ventricle by ECHO 2011 03/25/2012  . Hypothyroidism 03/25/2012  . High  cholesterol   . Type II diabetes mellitus   . H/O hiatal hernia   . Duodenal perforation 03/23/12  . Arthritis     "in my back"   Past Surgical History  Procedure Laterality Date  . Bladder tacked    . Repair perforated ulcer  03/23/12  . Diagnostic laparoscopy  03/25/12    REPAIR OF PERFORATED ULCER with omental patch  . Abcess drainage  03/25/12     of abdominal & pelvic abscesses  . Laparoscopy  03/24/2012    Procedure: LAPAROSCOPY DIAGNOSTIC;  Surgeon: Ardeth Sportsman, MD;  Location: [facility] OR;  Service: General;  Laterality: N/A;  drainage of abdominal and pelvic abcesses  . Laparotomy  06/03/2012    Procedure: EXPLORATORY LAPAROTOMY;  Surgeon: Mariella Saa, MD;  Location: [facility] OR;  Service: General;  Laterality: N/A;   Social History:  reports that she has never smoked. Her smokeless tobacco use includes Snuff. She reports that she does not drink alcohol or use illicit drugs. At baseline she is able to perform ADL's, has cognitive impairment.   No Known Allergies  No family history on file. Could not obtain family history, patient unable to participate in her own plan of care  Prior to Admission medications   Medication Sig Start Date End Date Taking? Authorizing Provider  aspirin 325 MG tablet Take 325 mg by mouth daily as needed for pain. Recently started.   Yes Historical  Provider, MD  bisoprolol-hydrochlorothiazide [facility]) 5-6.25 MG per tablet Take 1 tablet by mouth daily.   Yes Historical Provider, MD  Cholecalciferol (VITAMIN D) 2000 UNITS tablet Take 2,000 Units by mouth daily.   Yes Historical Provider, MD  clorazepate (TRANXENE) 7.5 MG tablet Take 3.75-7.5 mg by mouth 3 (three) times daily as needed for anxiety.   Yes Historical Provider, MD  donepezil (ARICEPT) 10 MG tablet Take 10 mg by mouth daily.    Yes Historical Provider, MD  levothyroxine (SYNTHROID, LEVOTHROID) 50 MCG tablet Take 50 mcg by mouth daily before breakfast.   Yes Historical Provider, MD  lisinopril  (PRINIVIL,ZESTRIL) 40 MG tablet Take 40 mg by mouth daily.   Yes Historical Provider, MD  pantoprazole (PROTONIX) 40 MG tablet Take 40 mg by mouth daily.   Yes Kathlen Mody, MD  potassium chloride SA (K-DUR,KLOR-CON) 20 MEQ tablet Take 20 mEq by mouth 2 (two) times daily.   Yes Historical Provider, MD  Travoprost, BAK Free, (TRAVATAN) 0.004 % SOLN ophthalmic solution Place 1 drop into both eyes at bedtime.   Yes Historical Provider, MD   Physical Exam: Filed Vitals:   08/25/13 1523  BP: 196/75  Pulse: 73  Temp:   Resp: 20     General:   Patient is nonverbal, not following commands, encephalopathic, ill-appearing  Eyes:  Pupils are equal round reactive to light extraocular movement intact, dry oral mucosa  Neck: I do not appreciate nuchal rigidity. Neck was symmetrical   Cardiovascular:  2/6 systolic ejection murmur, normal S1-S2 no murmurs rubs gallops no extremity edema   Respiratory:  lungs were clear to auscultation bilaterally no wheezing rhonchi or rales  Abdomen:  soft nontender nondistended positive bowel sounds  Skin:  no rashes or lesions noted   Musculoskeletal:  muscle atrophy noted to extremities, no edema   Psychiatric: Patient is nonverbal, not following commands, flat affect   Neurologic: She did not have facial droop, nuchal rigidity not appreciated. Could not evaluate speech as she is nonverbal. She appears to be moving all extremities however is not following commands for me. Difficult to assess neurologic status given significant mental status changes   Labs on Admission:  Basic Metabolic Panel:  Recent Labs Lab 08/25/13 1245  NA 136  K 4.0  CL 98  CO2 25  GLUCOSE 126*  BUN 13  CREATININE 0.62  CALCIUM 10.0   Liver Function Tests:  Recent Labs Lab 08/25/13 1245  AST 23  ALT 11  ALKPHOS 78  BILITOT 0.8  PROT 7.9  ALBUMIN 3.7   No results found for this basename: LIPASE, AMYLASE,  in the last 168 hours No results found for this basename:  AMMONIA,  in the last 168 hours CBC:  Recent Labs Lab 08/25/13 1245  WBC 7.9  NEUTROABS 6.2  HGB 14.7  HCT 43.1  MCV 87.8  PLT PLATELET CLUMPS NOTED ON SMEAR, COUNT APPEARS ADEQUATE   Cardiac Enzymes: No results found for this basename: CKTOTAL, CKMB, CKMBINDEX, TROPONINI,  in the last 168 hours  BNP (last 3 results) No results found for this basename: PROBNP,  in the last 8760 hours CBG: No results found for this basename: GLUCAP,  in the last 168 hours  Radiological Exams on Admission: Dg Chest 1 View  08/25/2013   CLINICAL DATA:  Altered mental status  EXAM: CHEST - 1 VIEW  COMPARISON:  Prior chest x-ray 02/27/2013  FINDINGS: Cardiomegaly with enlargement of the main pulmonary artery and possibly left atrial appendage. Atherosclerotic and  tortuous thoracic aorta. The lungs are clear. Coarse central bronchitic changes and mild prominence of the interstitial markings appear similar compared to prior. No acute osseous abnormality.  IMPRESSION: Enlargement of the main pulmonary artery and/or left atrial appendage. A left hilar mass is a less likely possibility. Recommend dedicated PA and lateral chest x-ray when the patient is able.  Otherwise, no active disease. Stable bronchitic changes and interstitial prominence.   Electronically Signed   By: Malachy Moan M.D.   On: 08/25/2013 13:46   Ct Head Wo Contrast  08/25/2013   CLINICAL DATA:  Altered mental status.  EXAM: CT HEAD WITHOUT CONTRAST  TECHNIQUE: Contiguous axial images were obtained from the base of the skull through the vertex without intravenous contrast.  COMPARISON:  02/27/2013  FINDINGS: No intracranial hemorrhage.  Prominent small vessel disease type changes without CT evidence of large acute infarct.  Global atrophy without hydrocephalus.  No intracranial mass lesion noted on this unenhanced exam.  .  Vascular calcifications.  Hyperostosis frontalis interna.  Polypoid opacification right frontal sinus. Mild mucosal  thickening ethmoid sinus air cells.  IMPRESSION: No intracranial hemorrhage.  Prominent small vessel disease type changes without CT evidence of large acute infarct.  Global atrophy without hydrocephalus.  Polypoid opacification right frontal sinus. Mild mucosal thickening ethmoid sinus air cells.   Electronically Signed   By: Bridgett Larsson M.D.   On: 08/25/2013 13:13   Dg Foot Complete Right  08/25/2013   CLINICAL DATA:  Altered mental status  EXAM: RIGHT FOOT COMPLETE - 3+ VIEW  COMPARISON:  None.  FINDINGS: Three views of the right foot submitted. No acute fracture or subluxation. Hallux valgus deformity. Diffuse osteopenia. Mild degenerative erosive changes noted distal aspect 1st metatarsal. Plantar spurring of calcaneus.  IMPRESSION: No acute fracture or subluxation. Plantar spurring of calcaneus. Diffuse osteopenia. Hallux valgus deformity. Mild degenerative changes.   Electronically Signed   By: Natasha Mead M.D.   On: 08/25/2013 13:39    EKG: Independently reviewed.  Assessment/Plan Active Problems:   DM (diabetes mellitus), type 2 with complications   Hypertension   Dementia   Acute encephalopathy   UTI (lower urinary tract infection)   1. Acute encephalopathy. Patient having been in her usual state health over the day yesterday, becoming confused overnight, and having a steep incline this morning. In the emergency room she was found to be febrile having a temperature of 102.4. Chest x-ray did not reveal active disease, urinalysis only showing small amount of leukocytes with rare bacteria. CT scan of brain without contrast showing microvascular changes. I discussed case with neurology, patient undergoing a lumbar puncture in the emergency department. Results are pending at the time of this dictation. Will order MRI of the brain, ammonia level, TSH. Await further recommendations from neurology. Meanwhile will cover with broad-spectrum empiric antibiotic therapy with IV vancomycin and  ceftriaxone. Minimize psychotropic medications, discontinue her home clorazepate. 2. Febrile illness. Unclear source of infection, with CSF fluid analysis pending at the time of this dictation. Will followup on blood cultures obtained in the emergency department. Start empiric IV antibiotic therapy with IV vancomycin and ceftriaxone. As I mentioned above urinalysis only showing small amount of leukocytes with rare bacteria. Will assess for other possible causes of infection.  3. Hypertension. Patient presenting with elevated blood pressures, with the most recent systolic blood pressure 190. Family members stating that she did not take her antihypertensive agents today. Will provide when necessary labetalol as well as nitro paste.  4.  Hypothyroidism. Will check a TSH level, meanwhile continue levothyroxine 50 mcg by mouth daily. 5. Type 2 diabetes mellitus. Last hemoglobin A1c of 6.3 on 07/17/2013. Provide Accu-Cheks q. a.c. each bedtime with sliding scale.  6. Nutrition. Heart healthy diet 7. DVT prophylaxis. Lovenox subcutaneous    Code Status: Full code  Family Communication: I discussed plan with family members who were present at bedside  Disposition Plan: Admitting patient to telemetry, I anticipate she'll require greater than 2 night hospitalization   Time spent: 70 minnutes  Jeralyn Bennett [facility] Pager [phone]  If 7PM-7AM, please contact night-coverage [email or web address] Password [email or web address] 08/25/2013, 3:52 PM

## 2013-08-25 NOTE — Progress Notes (Signed)
Pt arrived to floor, pt arousable, unable to follow commands, pt on droplet precautions for r/o meningitis, pt stable, will continue to monitor

## 2013-08-25 NOTE — ED Notes (Signed)
MD Hibma at bedside with MD Rancour preparing for lumbar puncture, consent signed by patient son due to patient not being able. Time out completed. MD Himba to preform procedure.

## 2013-08-25 NOTE — Progress Notes (Signed)
ANTIBIOTIC CONSULT NOTE - INITIAL  Pharmacy Consult for vancomycin Indication: fever  No Known Allergies  Patient Measurements: Wt= 52kg  Vital Signs: Temp: 101 F (38.3 C) (10/24 1438) Temp src: Rectal (10/24 1438) BP: 196/75 mmHg (10/24 1523) Pulse Rate: 73 (10/24 1523) Intake/Output from previous day:   Intake/Output from this shift: Total I/O In: -  Out: 1 [Urine:1]  Labs:  Recent Labs  08/25/13 1245  WBC 7.9  HGB 14.7  PLT PLATELET CLUMPS NOTED ON SMEAR, COUNT APPEARS ADEQUATE  CREATININE 0.62   The CrCl is unknown because both a height and weight (above a minimum accepted value) are required for this calculation. No results found for this basename: VANCOTROUGH, VANCOPEAK, VANCORANDOM, GENTTROUGH, GENTPEAK, GENTRANDOM, TOBRATROUGH, TOBRAPEAK, TOBRARND, AMIKACINPEAK, AMIKACINTROU, AMIKACIN,  in the last 72 hours   Microbiology: No results found for this or any previous visit (from the past 720 hour(s)).  Medical History: Past Medical History  Diagnosis Date  . Hypertension   . Diastolic dysfunction, left ventricle by ECHO 2011 03/25/2012  . Hypothyroidism 03/25/2012  . High cholesterol   . Type II diabetes mellitus   . H/O hiatal hernia   . Duodenal perforation 03/23/12  . Arthritis     "in my back"    Assessment: 79 yo female who presents with fever to begin vancomycin (also on rocephin). WBC= 7.9, tmax= 102.4, SCr= 0.62, CrCl ~ 50-60  Abx 10/24 rocephin 10/24 vancomycin  Cx 10/24 CSF 10/24 blood x2  Goal of Therapy:  Vancomycin trough level 15-20 mcg/ml  Plan:  -Vancomycin 750mg  IV x1  followed by 500mg  IV q12h -Will follow renal function, cultures and clinical progress  Harland German, Pharm D 08/25/2013 4:01 PM

## 2013-08-25 NOTE — ED Provider Notes (Signed)
CSN: [id number]     Arrival date & time 08/25/13  1134 History   First MD Initiated Contact with Patient 08/25/13 1137     Chief Complaint  Patient presents with  . Altered Mental Status   (Consider location/radiation/quality/duration/timing/severity/associated sxs/prior Treatment) HPI Megan [name] is a 79 y.o. female presents to emergency department complaining of altered mental status. History provided the patient's family. Patient lives alone with a care taker. Patient was visited by her daughter the last 2 days and has not had a caretaker come to the house. Daughter who has been with patient states that she started feeling unwell late last night and was less talkative than usual. This morning patient did not respond when they asked her to come down for breakfast. Patient is awake but is not following any directions according to the family. According to EMS patient was found in bed covered with urine. Elevated temperature. Patient took all of her medications last night before bedtime however she did not have any medications this morning. Family deny any recent illnesses or travel. Patient's baseline is alert and talkative, ambulatory, mild dementia.  Past Medical History  Diagnosis Date  . Hypertension   . Diastolic dysfunction, left ventricle by ECHO 2011 03/25/2012  . Hypothyroidism 03/25/2012  . High cholesterol   . Type II diabetes mellitus   . H/O hiatal hernia   . Duodenal perforation 03/23/12  . Arthritis     "in my back"   Past Surgical History  Procedure Laterality Date  . Bladder tacked    . Repair perforated ulcer  03/23/12  . Diagnostic laparoscopy  03/25/12    REPAIR OF PERFORATED ULCER with omental patch  . Abcess drainage  03/25/12     of abdominal & pelvic abscesses  . Laparoscopy  03/24/2012    Procedure: LAPAROSCOPY DIAGNOSTIC;  Surgeon: Ardeth Sportsman, MD;  Location: [facility] OR;  Service: General;  Laterality: N/A;  drainage of abdominal and pelvic abcesses  .  Laparotomy  06/03/2012    Procedure: EXPLORATORY LAPAROTOMY;  Surgeon: Mariella Saa, MD;  Location: [facility] OR;  Service: General;  Laterality: N/A;   No family history on file. History  Substance Use Topics  . Smoking status: Never Smoker   . Smokeless tobacco: Current User    Types: Snuff     Comment: 03/28/12 "use snuff q once in while; not regular"  . Alcohol Use: No   OB History   Grav Para Term Preterm Abortions TAB SAB Ect Mult Living                 Review of Systems  Unable to perform ROS: Mental status change  Constitutional: Positive for fever.  Respiratory: Negative for cough.   Cardiovascular: Negative for leg swelling.  Skin: Negative for rash.  All other systems reviewed and are negative.    Allergies  Review of patient's allergies indicates no known allergies.  Home Medications   Current Outpatient Rx  Name  Route  Sig  Dispense  Refill  . aspirin 325 MG tablet   Oral   Take 325 mg by mouth daily as needed for pain. Recently started.         . bisoprolol-hydrochlorothiazide (ZIAC) 5-6.25 MG per tablet   Oral   Take 1 tablet by mouth daily.         . Cholecalciferol (VITAMIN D) 2000 UNITS tablet   Oral   Take 2,000 Units by mouth daily.         Marland Kitchen  clorazepate (TRANXENE) 7.5 MG tablet   Oral   Take 3.75-7.5 mg by mouth 3 (three) times daily as needed for anxiety.         . donepezil (ARICEPT) 10 MG tablet   Oral   Take 10 mg by mouth daily.          Marland Kitchen levothyroxine (SYNTHROID, LEVOTHROID) 50 MCG tablet   Oral   Take 50 mcg by mouth daily before breakfast.         . lisinopril (PRINIVIL,ZESTRIL) 40 MG tablet   Oral   Take 40 mg by mouth daily.         . pantoprazole (PROTONIX) 40 MG tablet   Oral   Take 40 mg by mouth daily.         . potassium chloride SA (K-DUR,KLOR-CON) 20 MEQ tablet   Oral   Take 20 mEq by mouth 2 (two) times daily.         . Travoprost, BAK Free, (TRAVATAN) 0.004 % SOLN ophthalmic solution   Both  Eyes   Place 1 drop into both eyes at bedtime.          BP 209/84  Pulse 70  Temp(Src) 102.4 F (39.1 C) (Rectal)  Resp 13  SpO2 96% Physical Exam  Nursing note and vitals reviewed. Constitutional: She appears well-developed and well-nourished. No distress.  HENT:  Head: Normocephalic.  Eyes: Conjunctivae and EOM are normal. Pupils are equal, round, and reactive to light.  Neck: Normal range of motion. Neck supple.  Cardiovascular: Normal rate, regular rhythm and normal heart sounds.   Pulmonary/Chest: Effort normal and breath sounds normal. No respiratory distress. She has no wheezes. She has no rales.  Abdominal: Soft. Bowel sounds are normal. She exhibits no distension. There is no tenderness. There is no rebound.  Musculoskeletal: She exhibits no edema.  Bruising and swelling to the second toe of the right foot.  Neurological: She is alert.  Tracking. Patient is not following any commands.  Skin: Skin is warm and dry.    ED Course  Procedures (including critical care time) Labs Review Labs Reviewed  CBC WITH DIFFERENTIAL - Abnormal; Notable for the following:    Neutrophils Relative % 79 (*)    All other components within normal limits  COMPREHENSIVE METABOLIC PANEL - Abnormal; Notable for the following:    Glucose, Bld 126 (*)    GFR calc non Af Amer 81 (*)    All other components within normal limits  URINALYSIS W MICROSCOPIC + REFLEX CULTURE - Abnormal; Notable for the following:    APPearance CLOUDY (*)    Glucose, UA 100 (*)    Hgb urine dipstick LARGE (*)    Protein, ur 100 (*)    Leukocytes, UA SMALL (*)    Squamous Epithelial / LPF FEW (*)    All other components within normal limits  CULTURE, BLOOD (ROUTINE X 2)  CULTURE, BLOOD (ROUTINE X 2)  CSF CULTURE  GRAM STAIN  CSF CELL COUNT WITH DIFFERENTIAL  CSF CELL COUNT WITH DIFFERENTIAL  CSF CELL COUNT WITH DIFFERENTIAL  PROTEIN AND GLUCOSE, CSF  CG4 I-STAT (LACTIC ACID)  POCT I-STAT TROPONIN I    Imaging Review Dg Chest 1 View  08/25/2013   CLINICAL DATA:  Altered mental status  EXAM: CHEST - 1 VIEW  COMPARISON:  Prior chest x-ray 02/27/2013  FINDINGS: Cardiomegaly with enlargement of the main pulmonary artery and possibly left atrial appendage. Atherosclerotic and tortuous thoracic aorta. The lungs are clear. Coarse  central bronchitic changes and mild prominence of the interstitial markings appear similar compared to prior. No acute osseous abnormality.  IMPRESSION: Enlargement of the main pulmonary artery and/or left atrial appendage. A left hilar mass is a less likely possibility. Recommend dedicated PA and lateral chest x-ray when the patient is able.  Otherwise, no active disease. Stable bronchitic changes and interstitial prominence.   Electronically Signed   By: Malachy Moan M.D.   On: 08/25/2013 13:46   Ct Head Wo Contrast  08/25/2013   CLINICAL DATA:  Altered mental status.  EXAM: CT HEAD WITHOUT CONTRAST  TECHNIQUE: Contiguous axial images were obtained from the base of the skull through the vertex without intravenous contrast.  COMPARISON:  02/27/2013  FINDINGS: No intracranial hemorrhage.  Prominent small vessel disease type changes without CT evidence of large acute infarct.  Global atrophy without hydrocephalus.  No intracranial mass lesion noted on this unenhanced exam.  .  Vascular calcifications.  Hyperostosis frontalis interna.  Polypoid opacification right frontal sinus. Mild mucosal thickening ethmoid sinus air cells.  IMPRESSION: No intracranial hemorrhage.  Prominent small vessel disease type changes without CT evidence of large acute infarct.  Global atrophy without hydrocephalus.  Polypoid opacification right frontal sinus. Mild mucosal thickening ethmoid sinus air cells.   Electronically Signed   By: Bridgett Larsson M.D.   On: 08/25/2013 13:13   Dg Foot Complete Right  08/25/2013   CLINICAL DATA:  Altered mental status  EXAM: RIGHT FOOT COMPLETE - 3+ VIEW   COMPARISON:  None.  FINDINGS: Three views of the right foot submitted. No acute fracture or subluxation. Hallux valgus deformity. Diffuse osteopenia. Mild degenerative erosive changes noted distal aspect 1st metatarsal. Plantar spurring of calcaneus.  IMPRESSION: No acute fracture or subluxation. Plantar spurring of calcaneus. Diffuse osteopenia. Hallux valgus deformity. Mild degenerative changes.   Electronically Signed   By: Natasha Mead M.D.   On: 08/25/2013 13:39    EKG Interpretation   None       MDM   1. Acute delirium   2. Dementia   3. Hypertension   4. UTI (lower urinary tract infection)   5. Fever and chills   6. Altered mental state   7. Fever     Pt with altered mental status, last seen normal yesterday. Here fever of 102. She is alert, however, non verbal and not following any commands. She is in no distress. Labs not indicative of sepsis at this time. UA does have 21-50 WBC on catheterized specimen suggesting possible infection. Rocephin started. Pt admitted to [facility] for further evaluation. On exam, no signs of meningismus, no abdominal tenderness, lungs are clear.   Filed Vitals:   08/25/13 1415 08/25/13 1438 08/25/13 1523 08/25/13 1530  BP: 190/51  196/75 190/71  Pulse: 68  73 74  Temp:  101 F (38.3 C)    TempSrc:  Rectal    Resp: 11  20   SpO2: 98%  100% 100%       Lottie Mussel, PA-C 08/25/13 1630

## 2013-08-25 NOTE — Consult Note (Signed)
NEURO HOSPITALIST CONSULT NOTE    Reason for Consult: AMS with temp of 102  HPI:                                                                                                                                          Megan [name] is an 79 y.o. female who lives at home with husband (who recently has been in the hospital).  Per son she does have some underlying memory decline but was walking with walker yesterday, conversing and went out to lunch yesterday.  At 10 PM last night son called her and noted she was slurring her speech. This am her daughter woke up and went to get patient ready for the day. She noted patient was moaning and not responding to her. EMS was called and found patients bed soaked in Urine. Patient was brought to ED where initial Temp was 102.4, BP 209/84, UA yellow/cloudy/small leukocyte/no nitrites, CXR showed clear lungs, blood cultures pending.  Since in ED Temp has decreased to 101 but mentation has not improved.  Currently patient will open her eyes to voice, follow no commands, shows no nuchal rigity, withdraws to pain in all extremities.  Theres was a slight left NL decrease but son states this did not seem abnormal for her.   Past Medical History  Diagnosis Date  . Hypertension   . Diastolic dysfunction, left ventricle by ECHO 2011 03/25/2012  . Hypothyroidism 03/25/2012  . High cholesterol   . Type II diabetes mellitus   . H/O hiatal hernia   . Duodenal perforation 03/23/12  . Arthritis     "in my back"    Past Surgical History  Procedure Laterality Date  . Bladder tacked    . Repair perforated ulcer  03/23/12  . Diagnostic laparoscopy  03/25/12    REPAIR OF PERFORATED ULCER with omental patch  . Abcess drainage  03/25/12     of abdominal & pelvic abscesses  . Laparoscopy  03/24/2012    Procedure: LAPAROSCOPY DIAGNOSTIC;  Surgeon: Ardeth Sportsman, MD;  Location: [facility] OR;  Service: General;  Laterality: N/A;  drainage of abdominal and pelvic  abcesses  . Laparotomy  06/03/2012    Procedure: EXPLORATORY LAPAROTOMY;  Surgeon: Mariella Saa, MD;  Location: [facility] OR;  Service: General;  Laterality: N/A;    Family History  Problem Relation Age of Onset  . Hypertension Mother   . Hypertension Father      Social History:  reports that she has never smoked. Her smokeless tobacco use includes Snuff. She reports that she does not drink alcohol or use illicit drugs.  No Known Allergies  MEDICATIONS:  Current Facility-Administered Medications  Medication Dose Route Frequency Provider Last Rate Last Dose  . [START ON 08/26/2013] vancomycin (VANCOCIN) 500 mg in sodium chloride 0.9 % 100 mL IVPB  500 mg Intravenous Q12H Benny Lennert, [facility]      . vancomycin (VANCOCIN) IVPB 750 mg/150 ml premix  750 mg Intravenous NOW Benny Lennert, [facility]       Current Outpatient Prescriptions  Medication Sig Dispense Refill  . aspirin 325 MG tablet Take 325 mg by mouth daily as needed for pain. Recently started.      . bisoprolol-hydrochlorothiazide (ZIAC) 5-6.25 MG per tablet Take 1 tablet by mouth daily.      . Cholecalciferol (VITAMIN D) 2000 UNITS tablet Take 2,000 Units by mouth daily.      . clorazepate (TRANXENE) 7.5 MG tablet Take 3.75-7.5 mg by mouth 3 (three) times daily as needed for anxiety.      . donepezil (ARICEPT) 10 MG tablet Take 10 mg by mouth daily.       Marland Kitchen levothyroxine (SYNTHROID, LEVOTHROID) 50 MCG tablet Take 50 mcg by mouth daily before breakfast.      . lisinopril (PRINIVIL,ZESTRIL) 40 MG tablet Take 40 mg by mouth daily.      . pantoprazole (PROTONIX) 40 MG tablet Take 40 mg by mouth daily.      . potassium chloride SA (K-DUR,KLOR-CON) 20 MEQ tablet Take 20 mEq by mouth 2 (two) times daily.      . Travoprost, BAK Free, (TRAVATAN) 0.004 % SOLN ophthalmic solution Place 1 drop into both eyes at bedtime.          ROS:                                                                                                                                       History obtained from unobtainable from patient due to mental status   Blood pressure 196/75, pulse 73, temperature 101 F (38.3 C), temperature source Rectal, resp. rate 20, SpO2 100.00%.   Neurologic Examination:                                                                                                      Mental Status: Drowsy, opens her eyes to her name called, follows no commands and is nonverbal.  Cranial Nerves: II: Discs flat bilaterally; blinks to threat bilaterally, pupils equal, round, reactive to light and accommodation III,IV, VI: Doll's present, looks to the left and right to voices V,VII: face asymmetric on  the left, winces to pain bilaterally VIII: hearing normal bilaterally IX,X: gag reflex present XI: bilateral shoulder shrug XII: midline tongue extension  Motor: Withdrawls bilateral UE and LE antigravity with 4/5 strength to pain Sensory: Intact to noxious stimuli bilateral UE and LE Deep Tendon Reflexes:  Right: Upper Extremity   Left: Upper extremity   biceps (C-5 to C-6) 2/4   biceps (C-5 to C-6) 2/4 tricep (C7) 2/4    triceps (C7) 2/4 Brachioradialis (C6) 2/4  Brachioradialis (C6) 2/4  Lower Extremity Lower Extremity  quadriceps (L-2 to L-4) 2/4   quadriceps (L-2 to L-4) 2/4 Achilles (S1) 1/4   Achilles (S1) 1/4  Plantars: Up going bilaterally Cerebellar: Not able to assesst Gait: not assessed CV: pulses palpable throughout    Lab Results  Component Value Date/Time   CHOL  Value: 163        ATP III CLASSIFICATION:  <200     mg/dL   Desirable  782-956  mg/dL   Borderline High  >=213    mg/dL   High        0/86/5784  1:25 AM    Results for orders placed during the hospital encounter of 08/25/13 (from the past 48 hour(s))  URINALYSIS W MICROSCOPIC + REFLEX CULTURE     Status: Abnormal   Collection  Time    08/25/13 12:35 PM      Result Value Range   Color, Urine YELLOW  YELLOW   APPearance CLOUDY (*) CLEAR   Specific Gravity, Urine 1.025  1.005 - 1.030   pH 7.5  5.0 - 8.0   Glucose, UA 100 (*) NEGATIVE mg/dL   Hgb urine dipstick LARGE (*) NEGATIVE   Bilirubin Urine NEGATIVE  NEGATIVE   Ketones, ur NEGATIVE  NEGATIVE mg/dL   Protein, ur 696 (*) NEGATIVE mg/dL   Urobilinogen, UA 1.0  0.0 - 1.0 mg/dL   Nitrite NEGATIVE  NEGATIVE   Leukocytes, UA SMALL (*) NEGATIVE   WBC, UA 21-50  <3 WBC/hpf   RBC / HPF 3-6  <3 RBC/hpf   Bacteria, UA RARE  RARE   Squamous Epithelial / LPF FEW (*) RARE   Urine-Other LESS THAN 10 mL OF URINE SUBMITTED    CBC WITH DIFFERENTIAL     Status: Abnormal   Collection Time    08/25/13 12:45 PM      Result Value Range   WBC 7.9  4.0 - 10.5 K/uL   Comment: WHITE COUNT CONFIRMED ON SMEAR   RBC 4.91  3.87 - 5.11 MIL/uL   Hemoglobin 14.7  12.0 - 15.0 g/dL   HCT 29.5  28.4 - 13.2 %   MCV 87.8  78.0 - 100.0 fL   MCH 29.9  26.0 - 34.0 pg   MCHC 34.1  30.0 - 36.0 g/dL   RDW 44.0  10.2 - 72.5 %   Platelets    150 - 400 K/uL   Value: PLATELET CLUMPS NOTED ON SMEAR, COUNT APPEARS ADEQUATE   Neutrophils Relative % 79 (*) 43 - 77 %   Lymphocytes Relative 16  12 - 46 %   Monocytes Relative 5  3 - 12 %   Eosinophils Relative 0  0 - 5 %   Basophils Relative 0  0 - 1 %   Neutro Abs 6.2  1.7 - 7.7 K/uL   Lymphs Abs 1.3  0.7 - 4.0 K/uL   Monocytes Absolute 0.4  0.1 - 1.0 K/uL   Eosinophils Absolute 0.0  0.0 - 0.7 K/uL  Basophils Absolute 0.0  0.0 - 0.1 K/uL   RBC Morphology ELLIPTOCYTES     Smear Review       Value: PLATELET CLUMPS NOTED ON SMEAR, COUNT APPEARS ADEQUATE  COMPREHENSIVE METABOLIC PANEL     Status: Abnormal   Collection Time    08/25/13 12:45 PM      Result Value Range   Sodium 136  135 - 145 mEq/L   Potassium 4.0  3.5 - 5.1 mEq/L   Chloride 98  96 - 112 mEq/L   CO2 25  19 - 32 mEq/L   Glucose, Bld 126 (*) 70 - 99 mg/dL   BUN 13  6 - 23  mg/dL   Creatinine, Ser 1.61  0.50 - 1.10 mg/dL   Calcium 09.6  8.4 - 04.5 mg/dL   Total Protein 7.9  6.0 - 8.3 g/dL   Albumin 3.7  3.5 - 5.2 g/dL   AST 23  0 - 37 U/L   ALT 11  0 - 35 U/L   Alkaline Phosphatase 78  39 - 117 U/L   Total Bilirubin 0.8  0.3 - 1.2 mg/dL   GFR calc non Af Amer 81 (*) >90 mL/min   GFR calc Af Amer >90  >90 mL/min   Comment: (NOTE)     The eGFR has been calculated using the CKD EPI equation.     This calculation has not been validated in all clinical situations.     eGFR's persistently <90 mL/min signify possible Chronic Kidney     Disease.  CG4 I-STAT (LACTIC ACID)     Status: None   Collection Time    08/25/13 12:57 PM      Result Value Range   Lactic Acid, Venous 1.88  0.5 - 2.2 mmol/L  POCT I-STAT TROPONIN I     Status: None   Collection Time    08/25/13  3:22 PM      Result Value Range   Troponin i, poc 0.02  0.00 - 0.08 ng/mL   Comment 3            Comment: Due to the release kinetics of cTnI,     a negative result within the first hours     of the onset of symptoms does not rule out     myocardial infarction with certainty.     If myocardial infarction is still suspected,     repeat the test at appropriate intervals.    Dg Chest 1 View  08/25/2013   CLINICAL DATA:  Altered mental status  EXAM: CHEST - 1 VIEW  COMPARISON:  Prior chest x-ray 02/27/2013  FINDINGS: Cardiomegaly with enlargement of the main pulmonary artery and possibly left atrial appendage. Atherosclerotic and tortuous thoracic aorta. The lungs are clear. Coarse central bronchitic changes and mild prominence of the interstitial markings appear similar compared to prior. No acute osseous abnormality.  IMPRESSION: Enlargement of the main pulmonary artery and/or left atrial appendage. A left hilar mass is a less likely possibility. Recommend dedicated PA and lateral chest x-ray when the patient is able.  Otherwise, no active disease. Stable bronchitic changes and interstitial  prominence.   Electronically Signed   By: Malachy Moan M.D.   On: 08/25/2013 13:46   Ct Head Wo Contrast  08/25/2013   CLINICAL DATA:  Altered mental status.  EXAM: CT HEAD WITHOUT CONTRAST  TECHNIQUE: Contiguous axial images were obtained from the base of the skull through the vertex without intravenous contrast.  COMPARISON:  02/27/2013  FINDINGS: No intracranial hemorrhage.  Prominent small vessel disease type changes without CT evidence of large acute infarct.  Global atrophy without hydrocephalus.  No intracranial mass lesion noted on this unenhanced exam.  .  Vascular calcifications.  Hyperostosis frontalis interna.  Polypoid opacification right frontal sinus. Mild mucosal thickening ethmoid sinus air cells.  IMPRESSION: No intracranial hemorrhage.  Prominent small vessel disease type changes without CT evidence of large acute infarct.  Global atrophy without hydrocephalus.  Polypoid opacification right frontal sinus. Mild mucosal thickening ethmoid sinus air cells.   Electronically Signed   By: Bridgett Larsson M.D.   On: 08/25/2013 13:13   Dg Foot Complete Right  08/25/2013   CLINICAL DATA:  Altered mental status  EXAM: RIGHT FOOT COMPLETE - 3+ VIEW  COMPARISON:  None.  FINDINGS: Three views of the right foot submitted. No acute fracture or subluxation. Hallux valgus deformity. Diffuse osteopenia. Mild degenerative erosive changes noted distal aspect 1st metatarsal. Plantar spurring of calcaneus.  IMPRESSION: No acute fracture or subluxation. Plantar spurring of calcaneus. Diffuse osteopenia. Hallux valgus deformity. Mild degenerative changes.   Electronically Signed   By: Natasha Mead M.D.   On: 08/25/2013 13:39    Assessment and plan discussed with with attending physician and they are in agreement.    Felicie Morn PA-C [facility] [phone]  08/25/2013, 3:59 PM   Patient seen and examined.  Clinical course and management discussed.  Necessary edits performed.  I agree with  the above.  Assessment and plan of care developed and discussed below.    Assessment/Plan: 79 year old female presenting with altered mental status and fever.  No source noted.  Head CT reviewed and shows no acute changes.  LP performed and preliminary data does not suggest a meningitis with only one white cell noted.  Glucose is normal and protein is only mildly elevated.    Recommendations: 1.  CSF to be sent for herpes PCR  Thana Farr, MD [facility] [phone]  08/25/2013  6:30 PM

## 2013-08-25 NOTE — ED Notes (Signed)
Assisted Dr. Gregary Cromer with procedure to obtain Cerebrospinal fluid.

## 2013-08-25 NOTE — ED Provider Notes (Signed)
I saw and evaluated the patient, reviewed the resident's note and I agree with the findings and plan. If applicable, I agree with the resident's interpretation of the EKG.  If applicable, I was present for critical portions of any procedures performed.    Glynn Octave, MD 08/25/13 [phone]

## 2013-08-25 NOTE — ED Notes (Signed)
Pt returned from CT, now leaving for xray.

## 2013-08-25 NOTE — ED Notes (Signed)
Per EMS- pt comes from home with hx of dementia. per daughter who is in town and has been staying with her for 2 days, reports last night she started acting differently than baseline. Pt has a caregiver who stays with her normally but hasn;t been recently since the daughter is in town. When EMS arrived pt lying in the bed in a big pool of urine. Pt warm to touch, tympanic temp of 101.5. BP 220/80, CBG 111, HR 70. Pt will open he eyes to command/stimuli, no verbal response.

## 2013-08-25 NOTE — ED Provider Notes (Signed)
Medical screening examination/treatment/procedure(s) were conducted as a shared visit with non-physician practitioner(s) and myself.  I personally evaluated the patient during the encounter.  EKG Interpretation     Ventricular Rate:    PR Interval:    QRS Duration:   QT Interval:    QTC Calculation:   R Axis:     Text Interpretation:             Patient presents with AMS and fever.  Last seen normal last night.  Patient tracks and is arousable but will not follow commands. Patient with dry mucous membranes. Infectious w/u notable for 20-50 wbcs on cathed UA.  Otherwise largely unremarkable.  Patient given Rocephin for UTI.  WIll be admitted.  Admitting Dr. Herold Harms LP to r/u encephalopathy/meningitis.  Will be performed by resident physician.  Shon Baton, MD 08/25/13 [phone]

## 2013-08-25 NOTE — ED Notes (Signed)
Admitting MD at bedside.

## 2013-08-25 NOTE — ED Provider Notes (Signed)
LUMBAR PUNCTURE Date/Time: 08/25/2013 4:41 PM Performed by: Charm Barges Authorized by: Glynn Octave Consent: written consent obtained. Risks and benefits: risks, benefits and alternatives were discussed Consent given by: son. Patient understanding: patient states understanding of the procedure being performed Patient consent: the patient's understanding of the procedure matches consent given Procedure consent: procedure consent matches procedure scheduled Relevant documents: relevant documents present and verified Test results: test results available and properly labeled Site marked: the operative site was marked Imaging studies: imaging studies available Required items: required blood products, implants, devices, and special equipment available Patient identity confirmed: arm band and provided demographic data Time out: Immediately prior to procedure a "time out" was called to verify the correct patient, procedure, equipment, support staff and site/side marked as required. Indications: evaluation for infection and evaluation for altered mental status Anesthesia: local infiltration Local anesthetic: lidocaine 1% without epinephrine Anesthetic total: 5 ml Patient sedated: no Preparation: Patient was prepped and draped in the usual sterile fashion. Lumbar space: L3-L4 interspace Patient's position: sitting Needle gauge: 20 Needle length: 3.5 in Number of attempts: 3 Fluid appearance: clear Tubes of fluid: 4 Total volume: 5 ml Post-procedure: site cleaned and adhesive bandage applied Patient tolerance: Patient tolerated the procedure well with no immediate complications.   Asked to perform LP as part of AMS with fever work up prior to admission. Performed as above.   Charm Barges, MD 08/25/13 [phone]

## 2013-08-25 NOTE — ED Notes (Signed)
Admitting MD at bedside seeing patient, speaking with family. Neurology to see before patient goes upstairs.

## 2013-08-26 ENCOUNTER — Inpatient Hospital Stay (HOSPITAL_COMMUNITY): Payer: Medicare Other

## 2013-08-26 DIAGNOSIS — R1032 Left lower quadrant pain: Secondary | ICD-10-CM

## 2013-08-26 DIAGNOSIS — E118 Type 2 diabetes mellitus with unspecified complications: Secondary | ICD-10-CM

## 2013-08-26 DIAGNOSIS — E039 Hypothyroidism, unspecified: Secondary | ICD-10-CM

## 2013-08-26 DIAGNOSIS — E860 Dehydration: Secondary | ICD-10-CM

## 2013-08-26 DIAGNOSIS — R1031 Right lower quadrant pain: Secondary | ICD-10-CM

## 2013-08-26 DIAGNOSIS — G9340 Encephalopathy, unspecified: Secondary | ICD-10-CM

## 2013-08-26 DIAGNOSIS — E876 Hypokalemia: Secondary | ICD-10-CM

## 2013-08-26 LAB — CBC
HCT: 35.6 % — ABNORMAL LOW (ref 36.0–46.0)
Hemoglobin: 11.9 g/dL — ABNORMAL LOW (ref 12.0–15.0)
MCH: 29.7 pg (ref 26.0–34.0)
MCHC: 33.4 g/dL (ref 30.0–36.0)
MCV: 88.8 fL (ref 78.0–100.0)
Platelets: 138 10*3/uL — ABNORMAL LOW (ref 150–400)
RDW: 13.6 % (ref 11.5–15.5)

## 2013-08-26 LAB — COMPREHENSIVE METABOLIC PANEL
ALT: 8 U/L (ref 0–35)
AST: 17 U/L (ref 0–37)
Alkaline Phosphatase: 55 U/L (ref 39–117)
Calcium: 9.1 mg/dL (ref 8.4–10.5)
Chloride: 102 mEq/L (ref 96–112)
GFR calc Af Amer: >90 mL/min (ref >90)
GFR calc non Af Amer: 80 mL/min — ABNORMAL LOW (ref >90)
Glucose, Bld: 122 mg/dL — ABNORMAL HIGH (ref 70–99)
Potassium: 3.1 mEq/L — ABNORMAL LOW (ref 3.5–5.1)
Sodium: 138 mEq/L (ref 135–145)
Total Bilirubin: 0.9 mg/dL (ref 0.3–1.2)
Total Protein: 6.3 g/dL (ref 6.0–8.3)

## 2013-08-26 LAB — GLUCOSE, CAPILLARY: Glucose-Capillary: 105 mg/dL — ABNORMAL HIGH (ref 70–99)

## 2013-08-26 MED ORDER — POLYETHYLENE GLYCOL 3350 17 G PO PACK
17.0000 g | PACK | Freq: Every day | ORAL | Status: DC
  Administered 2013-08-26 – 2013-08-29 (×4): 17 g via ORAL
  Filled 2013-08-26 (×4): qty 1

## 2013-08-26 MED ORDER — SODIUM CHLORIDE 0.9 % IV SOLN
INTRAVENOUS | Status: AC
  Administered 2013-08-26: 17:00:00 via INTRAVENOUS

## 2013-08-26 MED ORDER — DOCUSATE SODIUM 50 MG PO CAPS
50.0000 mg | ORAL_CAPSULE | Freq: Two times a day (BID) | ORAL | Status: DC
  Administered 2013-08-26 (×2): 50 mg via ORAL
  Filled 2013-08-26 (×4): qty 1

## 2013-08-26 MED ORDER — BISOPROLOL FUMARATE 5 MG PO TABS
5.0000 mg | ORAL_TABLET | Freq: Every day | ORAL | Status: DC
  Administered 2013-08-26 – 2013-08-29 (×4): 5 mg via ORAL
  Filled 2013-08-26 (×4): qty 1

## 2013-08-26 MED ORDER — POTASSIUM CHLORIDE CRYS ER 20 MEQ PO TBCR
40.0000 meq | EXTENDED_RELEASE_TABLET | Freq: Once | ORAL | Status: AC
  Administered 2013-08-26: 40 meq via ORAL
  Filled 2013-08-26: qty 2

## 2013-08-26 MED ORDER — INSULIN ASPART 100 UNIT/ML ~~LOC~~ SOLN
0.0000 [IU] | Freq: Three times a day (TID) | SUBCUTANEOUS | Status: DC
  Administered 2013-08-27: 18:00:00 1 [IU] via SUBCUTANEOUS
  Administered 2013-08-27: 12:00:00 via SUBCUTANEOUS
  Administered 2013-08-27 – 2013-08-28 (×3): 1 [IU] via SUBCUTANEOUS

## 2013-08-26 MED ORDER — ISOSORB DINITRATE-HYDRALAZINE 20-37.5 MG PO TABS
1.0000 | ORAL_TABLET | Freq: Two times a day (BID) | ORAL | Status: DC
  Administered 2013-08-26 – 2013-08-28 (×5): 1 via ORAL
  Filled 2013-08-26 (×7): qty 1

## 2013-08-26 MED ORDER — GADOBENATE DIMEGLUMINE 529 MG/ML IV SOLN
10.0000 mL | Freq: Once | INTRAVENOUS | Status: AC | PRN
  Administered 2013-08-26: 10 mL via INTRAVENOUS

## 2013-08-26 MED ORDER — ENOXAPARIN SODIUM 30 MG/0.3ML ~~LOC~~ SOLN
30.0000 mg | SUBCUTANEOUS | Status: DC
  Administered 2013-08-26: 30 mg via SUBCUTANEOUS
  Filled 2013-08-26 (×2): qty 0.3

## 2013-08-26 NOTE — Progress Notes (Signed)
Lisa from IV team returned paged and will come and start new IV on patient.

## 2013-08-26 NOTE — Progress Notes (Addendum)
[facility] PROGRESS NOTE  Megan [name]:[id number] DOB: 09-08-1927 DOA: 08/25/2013 PCP: [name], MD  Assessment/Plan: 1-acute toxic/metabolic encephalopathy: Most likely secondary to presumed UTI and potentially hypertensive encephalopathy. -Ammonia level and TSH within normal limits -CT of the head without acute abnormalities -MRI ordered but pending -Urine culture pending; no growth on blood cultures so far -Patient mentation has improved after her blood pressure was controlled, fluid resuscitation was given and she was started on antibiotics -Will continue current regimen and supportive therapy, follow cultures, physical therapy has been order for evaluation and treatment -Will follow any further recommendations per neurology -No abnormalities on telemetry  2-presumed UTI: Patient with increased frequency and fever. -Follow urine cultures and continue Rocephin  3-Accelerated hypertension: Well controlled at this point. -Since patient mentation has improved and is currently able to take her medication by mouth, will discontinue topical nitroglycerin -Further adjustment to her antihypertensive regimen to be done as needed  4-hypothyroidism: TSH within normal limits. Continue Synthroid  5-diabetes mellitus type 2: Modified carbohydrate diet and sliding scale insulin. Most recent A1c demonstrated a level 6.3 and no active treatment as an outpatient.  6-constipation: Will start MiraLAX and Colace  7-dementia: continue aricept.  8-GERD: Continue PPI  9-hypokalemia: Will replete and check a magnesium level. Patient chronically on HCTZ which: Most likely explained her low potassium.  DVT: lovenox.  Code Status: Full Family Communication: daughter at bedside Disposition Plan: to be determine  Consultants:  Neurology   Procedures:  See below for x-ray reports  LP done on 08/25/13 (final cx report pending)  MRI pending  Antibiotics:  vanc and  rocephin  HPI/Subjective: Patient afebrile, with improved mentation and asking for food  Objective: Filed Vitals:   08/26/13 0924  BP: 118/77  Pulse: 53  Temp: 98.2 F (36.8 C)  Resp: 18    Intake/Output Summary (Last 24 hours) at 08/26/13 1523 Last data filed at 08/26/13 0924  Gross per 24 hour  Intake   1025 ml  Output    202 ml  Net    823 ml   Filed Weights   08/25/13 1726 08/26/13 0552  Weight: 53.8 kg (118 lb 9.7 oz) 54 kg (119 lb 0.8 oz)    Exam:   General:  NAD, AAOX2, afebrile  Cardiovascular: S1 and S2, no rubs or gallops  Respiratory: CTA bilaterally  Abdomen: soft, NT, ND, positive BS  Musculoskeletal: no edema or cyanosis  Data Reviewed: Basic Metabolic Panel:  Recent Labs Lab 08/25/13 1245 08/25/13 2112 08/26/13 0450  NA 136  --  138  K 4.0  --  3.1*  CL 98  --  102  CO2 25  --  24  GLUCOSE 126*  --  122*  BUN 13  --  11  CREATININE 0.62 0.59 0.64  CALCIUM 10.0  --  9.1   Liver Function Tests:  Recent Labs Lab 08/25/13 1245 08/26/13 0450  AST 23 17  ALT 11 8  ALKPHOS 78 55  BILITOT 0.8 0.9  PROT 7.9 6.3  ALBUMIN 3.7 2.9*    Recent Labs Lab 08/25/13 2112  AMMONIA 33   CBC:  Recent Labs Lab 08/25/13 1245 08/25/13 2112 08/26/13 0450  WBC 7.9 7.8 6.6  NEUTROABS 6.2  --   --   HGB 14.7 13.0 11.9*  HCT 43.1 38.2 35.6*  MCV 87.8 88.8 88.8  PLT PLATELET CLUMPS NOTED ON SMEAR, COUNT APPEARS ADEQUATE 131* 138*   Cardiac Enzymes: No results found for this basename: CKTOTAL,  CKMB, CKMBINDEX, TROPONINI,  in the last 168 hours BNP (last 3 results) No results found for this basename: PROBNP,  in the last 8760 hours CBG: No results found for this basename: GLUCAP,  in the last 168 hours  Recent Results (from the past 240 hour(s))  CULTURE, BLOOD (ROUTINE X 2)     Status: None   Collection Time    08/25/13 12:30 PM      Result Value Range Status   Specimen Description BLOOD RIGHT HAND   Final   Special Requests  BOTTLES DRAWN AEROBIC ONLY 10CC   Final   Culture  Setup Time     Final   Value: 08/25/2013 16:28     Performed at [facility]   Culture     Final   Value:        BLOOD CULTURE RECEIVED NO GROWTH TO DATE CULTURE WILL BE HELD FOR 5 DAYS BEFORE ISSUING A FINAL NEGATIVE REPORT     Performed at [facility]   Report Status PENDING   Incomplete  CULTURE, BLOOD (ROUTINE X 2)     Status: None   Collection Time    08/25/13 12:45 PM      Result Value Range Status   Specimen Description BLOOD LEFT ANTECUBITAL   Final   Special Requests     Final   Value: BOTTLES DRAWN AEROBIC AND ANAEROBIC 10CC AERO, 7CC ANA   Culture  Setup Time     Final   Value: 08/25/2013 16:27     Performed at [facility]   Culture     Final   Value:        BLOOD CULTURE RECEIVED NO GROWTH TO DATE CULTURE WILL BE HELD FOR 5 DAYS BEFORE ISSUING A FINAL NEGATIVE REPORT     Performed at [facility]   Report Status PENDING   Incomplete  CSF CULTURE     Status: None   Collection Time    08/25/13  4:37 PM      Result Value Range Status   Specimen Description CSF   Final   Special Requests 1.0ML FLUID   Final   Gram Stain     Final   Value: WBC PRESENT, PREDOMINANTLY MONONUCLEAR     NO ORGANISMS SEEN     Performed at [facility] CYTOSPIN     Performed at [facility]   Culture     Final   Value: NO GROWTH 1 DAY     Performed at [facility]   Report Status PENDING   Incomplete  GRAM STAIN     Status: None   Collection Time    08/25/13  4:37 PM      Result Value Range Status   Specimen Description CSF   Final   Special Requests 1.0ML FLUID   Final   Gram Stain     Final   Value: CYTOSPIN PREP     WBC PRESENT, PREDOMINANTLY MONONUCLEAR     NO ORGANISMS SEEN   Report Status 08/25/2013 FINAL   Final     Studies: Dg Chest 1 View  08/25/2013   CLINICAL DATA:  Altered mental status  EXAM: CHEST - 1 VIEW  COMPARISON:  Prior chest x-ray 02/27/2013  FINDINGS:  Cardiomegaly with enlargement of the main pulmonary artery and possibly left atrial appendage. Atherosclerotic and tortuous thoracic aorta. The lungs are clear. Coarse central bronchitic changes and mild prominence of the interstitial markings appear similar compared to  prior. No acute osseous abnormality.  IMPRESSION: Enlargement of the main pulmonary artery and/or left atrial appendage. A left hilar mass is a less likely possibility. Recommend dedicated PA and lateral chest x-ray when the patient is able.  Otherwise, no active disease. Stable bronchitic changes and interstitial prominence.   Electronically Signed   By: Malachy Moan M.D.   On: 08/25/2013 13:46   Ct Head Wo Contrast  08/25/2013   CLINICAL DATA:  Altered mental status.  EXAM: CT HEAD WITHOUT CONTRAST  TECHNIQUE: Contiguous axial images were obtained from the base of the skull through the vertex without intravenous contrast.  COMPARISON:  02/27/2013  FINDINGS: No intracranial hemorrhage.  Prominent small vessel disease type changes without CT evidence of large acute infarct.  Global atrophy without hydrocephalus.  No intracranial mass lesion noted on this unenhanced exam.  .  Vascular calcifications.  Hyperostosis frontalis interna.  Polypoid opacification right frontal sinus. Mild mucosal thickening ethmoid sinus air cells.  IMPRESSION: No intracranial hemorrhage.  Prominent small vessel disease type changes without CT evidence of large acute infarct.  Global atrophy without hydrocephalus.  Polypoid opacification right frontal sinus. Mild mucosal thickening ethmoid sinus air cells.   Electronically Signed   By: Bridgett Larsson M.D.   On: 08/25/2013 13:13   Dg Foot Complete Right  08/25/2013   CLINICAL DATA:  Altered mental status  EXAM: RIGHT FOOT COMPLETE - 3+ VIEW  COMPARISON:  None.  FINDINGS: Three views of the right foot submitted. No acute fracture or subluxation. Hallux valgus deformity. Diffuse osteopenia. Mild degenerative erosive  changes noted distal aspect 1st metatarsal. Plantar spurring of calcaneus.  IMPRESSION: No acute fracture or subluxation. Plantar spurring of calcaneus. Diffuse osteopenia. Hallux valgus deformity. Mild degenerative changes.   Electronically Signed   By: Natasha Mead M.D.   On: 08/25/2013 13:39    Scheduled Meds: . bisoprolol  5 mg Oral Daily  . cefTRIAXone (ROCEPHIN)  IV  1 g Intravenous Q24H  . docusate sodium  50 mg Oral BID  . donepezil  10 mg Oral Daily  . enoxaparin (LOVENOX) injection  30 mg Subcutaneous Q24H  . isosorbide-hydrALAZINE  1 tablet Oral BID  . latanoprost  1 drop Both Eyes QHS  . levothyroxine  50 mcg Oral QAC breakfast  . lisinopril  40 mg Oral Daily  . pantoprazole  40 mg Oral Daily  . polyethylene glycol  17 g Oral Daily  . sodium chloride  3 mL Intravenous Q12H  . vancomycin  500 mg Intravenous Q12H   Continuous Infusions: . sodium chloride      Active Problems:   DM (diabetes mellitus), type 2 with complications   Hypertension   Dementia   Acute encephalopathy   UTI (lower urinary tract infection)    Time spent: >30 minutes   Stevi Hollinshead  [facility] Pager [phone]. If 7PM-7AM, please contact night-coverage at [email or web address], password [facility] 08/26/2013, 3:23 PM  LOS: 1 day

## 2013-08-26 NOTE — Progress Notes (Signed)
Patient in chair. Patient alert, denies pain, has no complaints at this time. Will speak to Dr. Gwenlyn Perking about nutrition for patient since cognition has improved. Will continue to monitor.

## 2013-08-26 NOTE — Progress Notes (Signed)
Monitor discontinued per MD order. CCMD notified. Monitor cleaned and placed in appropriate cubby at nurses station.

## 2013-08-27 LAB — URINE CULTURE
Colony Count: NO GROWTH
Culture: NO GROWTH

## 2013-08-27 LAB — BASIC METABOLIC PANEL
BUN: 12 mg/dL (ref 6–23)
CO2: 23 mEq/L (ref 19–32)
Creatinine, Ser: 0.61 mg/dL (ref 0.50–1.10)
GFR calc Af Amer: >90 mL/min (ref >90)
GFR calc non Af Amer: 81 mL/min — ABNORMAL LOW (ref >90)
Glucose, Bld: 131 mg/dL — ABNORMAL HIGH (ref 70–99)
Potassium: 4 mEq/L (ref 3.5–5.1)

## 2013-08-27 LAB — GLUCOSE, CAPILLARY
Glucose-Capillary: 122 mg/dL — ABNORMAL HIGH (ref 70–99)
Glucose-Capillary: 148 mg/dL — ABNORMAL HIGH (ref 70–99)
Glucose-Capillary: 151 mg/dL — ABNORMAL HIGH (ref 70–99)
Glucose-Capillary: 120 mg/dL — ABNORMAL HIGH (ref 70–99)

## 2013-08-27 LAB — HERPES SIMPLEX VIRUS(HSV) DNA BY PCR: HSV 2 DNA: NOT DETECTED

## 2013-08-27 MED ORDER — DOCUSATE SODIUM 50 MG/5ML PO LIQD
50.0000 mg | Freq: Two times a day (BID) | ORAL | Status: DC
  Administered 2013-08-27 – 2013-08-29 (×5): 50 mg via ORAL
  Filled 2013-08-27 (×7): qty 10

## 2013-08-27 MED ORDER — ASPIRIN 81 MG PO CHEW
81.0000 mg | CHEWABLE_TABLET | Freq: Every day | ORAL | Status: DC
  Administered 2013-08-28 – 2013-08-29 (×2): 81 mg via ORAL
  Filled 2013-08-27 (×2): qty 1

## 2013-08-27 MED ORDER — ENOXAPARIN SODIUM 40 MG/0.4ML ~~LOC~~ SOLN
40.0000 mg | Freq: Every day | SUBCUTANEOUS | Status: DC
  Administered 2013-08-27 – 2013-08-28 (×2): 40 mg via SUBCUTANEOUS
  Filled 2013-08-27 (×3): qty 0.4

## 2013-08-27 MED ORDER — ENSURE PUDDING PO PUDG
1.0000 | Freq: Two times a day (BID) | ORAL | Status: DC
  Administered 2013-08-27 – 2013-08-29 (×5): 1 via ORAL

## 2013-08-27 MED ORDER — GLUCERNA SHAKE PO LIQD
237.0000 mL | ORAL | Status: DC
  Administered 2013-08-27 – 2013-08-28 (×2): 237 mL via ORAL

## 2013-08-27 NOTE — Plan of Care (Signed)
Problem: Phase I Progression Outcomes Goal: Voiding-avoid urinary catheter unless indicated Outcome: Not Met (add Reason) Pt incontinent

## 2013-08-27 NOTE — Progress Notes (Signed)
[facility] PROGRESS NOTE  Megan [name] RUE:[id number] DOB: 02/22/1927 DOA: 08/25/2013 PCP: [name], MD  Assessment/Plan: 1-acute toxic/metabolic encephalopathy: Most likely secondary to presumed UTI and potentially hypertensive encephalopathy. -Ammonia level and TSH within normal limits -CT and MRI of the head without acute abnormalities -Urine culture w/o growth but patient unfortunately started on abx's by the collection was made. No growth on blood cultures so far -Patient mentation has improved after her blood pressure was controlled, fluid resuscitation given and she was started on antibiotics -Will continue abx's for UTI; discontinue vanc -doubt meningitis -d/c droplets precautions -negative herpes PCR -No abnormalities on telemetry; tele d/c  2-presumed UTI: Patient with increased frequency and fever. -continue rocephin for 1 more day -cx's w/o any growth, but patient's urine sent for culture after abx's on board -will treat for a total of 5 days.  3-Accelerated hypertension: Well controlled at this point. -Further adjustment to her antihypertensive regimen to be done as needed  4-hypothyroidism: TSH within normal limits. Continue Synthroid  5-diabetes mellitus type 2: Modified carbohydrate diet and sliding scale insulin. Most recent A1c demonstrated a level 6.3 and no active treatment as an outpatient.  6-constipation: Will continue MiraLAX and Colace; prn sorbitol ordered as well  7-dementia: continue aricept.  8-GERD: Continue PPI  9-hypokalemia: repleted.  10-physical deconditioning:PT recommending SNF. Will discussed options with family members, patient and SW  DVT: lovenox.  Code Status: Full Family Communication: daughter at bedside Disposition Plan: to be determine  Consultants:  Neurology   Procedures:  See below for x-ray reports  LP done on 08/25/13 (final cx report pending)  MRI pending  Antibiotics:  vanc and  rocephin  HPI/Subjective: Patient afebrile; AAOX2. Per family close to baseline.  Objective: Filed Vitals:   08/27/13 2037  BP: 132/68  Pulse:   Temp:   Resp:     Intake/Output Summary (Last 24 hours) at 08/27/13 2323 Last data filed at 08/27/13 1023  Gross per 24 hour  Intake    275 ml  Output      0 ml  Net    275 ml   Filed Weights   08/25/13 1726 08/26/13 0552 08/27/13 0537  Weight: 53.8 kg (118 lb 9.7 oz) 54 kg (119 lb 0.8 oz) 54.2 kg (119 lb 7.8 oz)    Exam:   General:  NAD, AAOX2, afebrile  Cardiovascular: S1 and S2, no rubs or gallops  Respiratory: CTA bilaterally  Abdomen: soft, NT, ND, positive BS  Musculoskeletal: no edema or cyanosis  Data Reviewed: Basic Metabolic Panel:  Recent Labs Lab 08/25/13 1245 08/25/13 2112 08/26/13 0450 08/27/13 0500  NA 136  --  138 136  K 4.0  --  3.1* 4.0  CL 98  --  102 99  CO2 25  --  24 23  GLUCOSE 126*  --  122* 131*  BUN 13  --  11 12  CREATININE 0.62 0.59 0.64 0.61  CALCIUM 10.0  --  9.1 9.6  MG  --   --   --  1.7   Liver Function Tests:  Recent Labs Lab 08/25/13 1245 08/26/13 0450  AST 23 17  ALT 11 8  ALKPHOS 78 55  BILITOT 0.8 0.9  PROT 7.9 6.3  ALBUMIN 3.7 2.9*    Recent Labs Lab 08/25/13 2112  AMMONIA 33   CBC:  Recent Labs Lab 08/25/13 1245 08/25/13 2112 08/26/13 0450  WBC 7.9 7.8 6.6  NEUTROABS 6.2  --   --   HGB 14.7  13.0 11.9*  HCT 43.1 38.2 35.6*  MCV 87.8 88.8 88.8  PLT PLATELET CLUMPS NOTED ON SMEAR, COUNT APPEARS ADEQUATE 131* 138*   Cardiac Enzymes: No results found for this basename: CKTOTAL, CKMB, CKMBINDEX, TROPONINI,  in the last 168 hours BNP (last 3 results) No results found for this basename: PROBNP,  in the last 8760 hours CBG:  Recent Labs Lab 08/26/13 2128 08/27/13 0623 08/27/13 1601 08/27/13 1731 08/27/13 2125  GLUCAP 146* 122* 148* 151* 120*    Recent Results (from the past 240 hour(s))  CULTURE, BLOOD (ROUTINE X 2)     Status: None    Collection Time    08/25/13 12:30 PM      Result Value Range Status   Specimen Description BLOOD RIGHT HAND   Final   Special Requests BOTTLES DRAWN AEROBIC ONLY 10CC   Final   Culture  Setup Time     Final   Value: 08/25/2013 16:28     Performed at [facility]   Culture     Final   Value:        BLOOD CULTURE RECEIVED NO GROWTH TO DATE CULTURE WILL BE HELD FOR 5 DAYS BEFORE ISSUING A FINAL NEGATIVE REPORT     Performed at [facility]   Report Status PENDING   Incomplete  CULTURE, BLOOD (ROUTINE X 2)     Status: None   Collection Time    08/25/13 12:45 PM      Result Value Range Status   Specimen Description BLOOD LEFT ANTECUBITAL   Final   Special Requests     Final   Value: BOTTLES DRAWN AEROBIC AND ANAEROBIC 10CC AERO, 7CC ANA   Culture  Setup Time     Final   Value: 08/25/2013 16:27     Performed at [facility]   Culture     Final   Value:        BLOOD CULTURE RECEIVED NO GROWTH TO DATE CULTURE WILL BE HELD FOR 5 DAYS BEFORE ISSUING A FINAL NEGATIVE REPORT     Performed at [facility]   Report Status PENDING   Incomplete  CSF CULTURE     Status: None   Collection Time    08/25/13  4:37 PM      Result Value Range Status   Specimen Description CSF   Final   Special Requests 1.0ML FLUID   Final   Gram Stain     Final   Value: WBC PRESENT, PREDOMINANTLY MONONUCLEAR     NO ORGANISMS SEEN     Performed at [facility] CYTOSPIN     Performed at [facility]   Culture     Final   Value: NO GROWTH 2 DAYS     Performed at [facility]   Report Status PENDING   Incomplete  GRAM STAIN     Status: None   Collection Time    08/25/13  4:37 PM      Result Value Range Status   Specimen Description CSF   Final   Special Requests 1.0ML FLUID   Final   Gram Stain     Final   Value: CYTOSPIN PREP     WBC PRESENT, PREDOMINANTLY MONONUCLEAR     NO ORGANISMS SEEN   Report Status 08/25/2013 FINAL   Final  URINE CULTURE      Status: None   Collection Time    08/26/13  8:39 AM      Result  Value Range Status   Specimen Description URINE, RANDOM   Final   Special Requests NONE   Final   Culture  Setup Time     Final   Value: 2013-08-30 13:22     Performed at [facility]   Colony Count     Final   Value: NO GROWTH     Performed at [facility]   Culture     Final   Value: NO GROWTH     Performed at [facility]   Report Status 08/27/2013 FINAL   Final     Studies: Mr Lodema Pilot Contrast  2013/08/30   CLINICAL DATA:  Increased confusion and agitation. Acute encephalopathy.  EXAM: MRI HEAD WITHOUT AND WITH CONTRAST  TECHNIQUE: Multiplanar, multiecho pulse sequences of the brain and surrounding structures were obtained according to standard protocol without and with intravenous contrast  CONTRAST:  10mL MULTIHANCE GADOBENATE DIMEGLUMINE 529 MG/ML IV SOLN  COMPARISON:  CT head without contrast 08/25/2013. MRI brain 04/27/2012.  FINDINGS: The midline structures are within normal limits. The diffusion-weighted images demonstrate no evidence for acute or subacute infarction. The ventricles are proportionate to the degree of atrophy without significant interval change. Diffuse white matter changes are similar to the prior exam. Mild brainstem white matter changes are asymmetric on the right.  Diffuse signal abnormality is present within the thalami bilaterally. Remote lacunar infarcts are present within the basal ganglia bilaterally.  The postcontrast images are markedly degraded by patient motion. No pathologic enhancement is evident. Hyperostosis frontalis internus is again noted. Mucosal thickening and a fluid level is present within the lateral right frontal sinus. The remaining paranasal sinuses and the mastoid air cells are clear.  IMPRESSION: 1. Stable atrophy and diffuse white matter disease. This likely reflects the sequelae of chronic microvascular ischemia. 2. No acute intracranial  abnormality or significant interval change. 3. New right frontal sinus disease.   Electronically Signed   By: Gennette Pac M.D.   On: Aug 30, 2013 17:50    Scheduled Meds: . [START ON 08/28/2013] aspirin  81 mg Oral Daily  . bisoprolol  5 mg Oral Daily  . cefTRIAXone (ROCEPHIN)  IV  1 g Intravenous Q24H  . docusate  50 mg Oral BID  . donepezil  10 mg Oral Daily  . enoxaparin (LOVENOX) injection  40 mg Subcutaneous QHS  . feeding supplement (ENSURE)  1 Container Oral BID PC  . feeding supplement (GLUCERNA SHAKE)  237 mL Oral Q24H  . insulin aspart  0-9 Units Subcutaneous TID WC  . isosorbide-hydrALAZINE  1 tablet Oral BID  . latanoprost  1 drop Both Eyes QHS  . levothyroxine  50 mcg Oral QAC breakfast  . lisinopril  40 mg Oral Daily  . pantoprazole  40 mg Oral Daily  . polyethylene glycol  17 g Oral Daily  . sodium chloride  3 mL Intravenous Q12H   Continuous Infusions:    Active Problems:   DM (diabetes mellitus), type 2 with complications   Hypertension   Dementia   Acute encephalopathy   UTI (lower urinary tract infection)    Time spent: >30 minutes   Coleta Grosshans  [facility] Pager [phone]. If 7PM-7AM, please contact night-coverage at [email or web address], password [facility] 08/27/2013, 11:23 PM  LOS: 2 days

## 2013-08-27 NOTE — Progress Notes (Signed)
Subjective: Patient sleeping when I enter the room but able to be awakened.  No fully oriented but speech is appropriate.    Objective: Current vital signs: BP 158/58  Pulse 76  Temp(Src) 98.8 F (37.1 C) (Axillary)  Resp 18  Ht 5\' 5"  (1.651 m)  Wt 54.2 kg (119 lb 7.8 oz)  BMI 19.88 kg/m2  SpO2 100% Vital signs in last 24 hours: Temp:  [98 F (36.7 C)-98.8 F (37.1 C)] 98.8 F (37.1 C) (10/26 0537) Pulse Rate:  [58-83] 76 (10/26 0700) Resp:  [18] 18 (10/26 0700) BP: (118-200)/(58-95) 158/58 mmHg (10/26 0700) SpO2:  [99 %-100 %] 100 % (10/26 0700) Weight:  [54.2 kg (119 lb 7.8 oz)] 54.2 kg (119 lb 7.8 oz) (10/26 0537)  Intake/Output from previous day: 10/25 0701 - 10/26 0700 In: 1455 [P.O.:480; IV Piggyback:975] Out: 401 [Urine:401] Intake/Output this shift:   Nutritional status: Carb Control  Neurologic Exam: Mental Status:  Asleep but easily awakened.  Follows commands.  Oriented to name but not to place or year.  Fluent.  Cranial Nerves:  II: Discs flat bilaterally; blinks to threat bilaterally, pupils equal, round, reactive to light and accommodation  III,IV, VI: EOM's grossly intact V,VII: face asymmetric on the left, winces to pain bilaterally  VIII: hearing normal bilaterally  IX,X: gag reflex present  XI: bilateral shoulder shrug  XII: midline tongue extension  Motor:  Moves all extremities against gravity Sensory: Intact to light stimulation throughout Deep Tendon Reflexes:  2+ throughout with 1+ AJ's bilaterally Plantars:  Up going bilaterally   Lab Results: Basic Metabolic Panel:  Recent Labs Lab 08/25/13 1245 08/25/13 2112 08/26/13 0450 08/27/13 0500  NA 136  --  138 136  K 4.0  --  3.1* 4.0  CL 98  --  102 99  CO2 25  --  24 23  GLUCOSE 126*  --  122* 131*  BUN 13  --  11 12  CREATININE 0.62 0.59 0.64 0.61  CALCIUM 10.0  --  9.1 9.6  MG  --   --   --  1.7    Liver Function Tests:  Recent Labs Lab 08/25/13 1245 08/26/13 0450   AST 23 17  ALT 11 8  ALKPHOS 78 55  BILITOT 0.8 0.9  PROT 7.9 6.3  ALBUMIN 3.7 2.9*   No results found for this basename: LIPASE, AMYLASE,  in the last 168 hours  Recent Labs Lab 08/25/13 2112  AMMONIA 33    CBC:  Recent Labs Lab 08/25/13 1245 08/25/13 2112 08/26/13 0450  WBC 7.9 7.8 6.6  NEUTROABS 6.2  --   --   HGB 14.7 13.0 11.9*  HCT 43.1 38.2 35.6*  MCV 87.8 88.8 88.8  PLT PLATELET CLUMPS NOTED ON SMEAR, COUNT APPEARS ADEQUATE 131* 138*    Cardiac Enzymes: No results found for this basename: CKTOTAL, CKMB, CKMBINDEX, TROPONINI,  in the last 168 hours  Lipid Panel: No results found for this basename: CHOL, TRIG, HDL, CHOLHDL, VLDL, LDLCALC,  in the last 168 hours  CBG:  Recent Labs Lab 08/26/13 1714 08/26/13 2128 08/27/13 0623  GLUCAP 105* 146* 122*    Microbiology: Results for orders placed during the hospital encounter of 08/25/13  CULTURE, BLOOD (ROUTINE X 2)     Status: None   Collection Time    08/25/13 12:30 PM      Result Value Range Status   Specimen Description BLOOD RIGHT HAND   Final   Special Requests BOTTLES DRAWN AEROBIC ONLY  10CC   Final   Culture  Setup Time     Final   Value: 08/25/2013 16:28     Performed at [facility]   Culture     Final   Value:        BLOOD CULTURE RECEIVED NO GROWTH TO DATE CULTURE WILL BE HELD FOR 5 DAYS BEFORE ISSUING A FINAL NEGATIVE REPORT     Performed at [facility]   Report Status PENDING   Incomplete  CULTURE, BLOOD (ROUTINE X 2)     Status: None   Collection Time    08/25/13 12:45 PM      Result Value Range Status   Specimen Description BLOOD LEFT ANTECUBITAL   Final   Special Requests     Final   Value: BOTTLES DRAWN AEROBIC AND ANAEROBIC 10CC AERO, 7CC ANA   Culture  Setup Time     Final   Value: 08/25/2013 16:27     Performed at [facility]   Culture     Final   Value:        BLOOD CULTURE RECEIVED NO GROWTH TO DATE CULTURE WILL BE HELD FOR 5 DAYS BEFORE ISSUING  A FINAL NEGATIVE REPORT     Performed at [facility]   Report Status PENDING   Incomplete  CSF CULTURE     Status: None   Collection Time    08/25/13  4:37 PM      Result Value Range Status   Specimen Description CSF   Final   Special Requests 1.0ML FLUID   Final   Gram Stain     Final   Value: WBC PRESENT, PREDOMINANTLY MONONUCLEAR     NO ORGANISMS SEEN     Performed at [facility] CYTOSPIN     Performed at [facility]   Culture     Final   Value: NO GROWTH 1 DAY     Performed at [facility]   Report Status PENDING   Incomplete  GRAM STAIN     Status: None   Collection Time    08/25/13  4:37 PM      Result Value Range Status   Specimen Description CSF   Final   Special Requests 1.0ML FLUID   Final   Gram Stain     Final   Value: CYTOSPIN PREP     WBC PRESENT, PREDOMINANTLY MONONUCLEAR     NO ORGANISMS SEEN   Report Status 08/25/2013 FINAL   Final  URINE CULTURE     Status: None   Collection Time    08/26/13  8:39 AM      Result Value Range Status   Specimen Description URINE, RANDOM   Final   Special Requests NONE   Final   Culture  Setup Time     Final   Value: 08/26/2013 13:22     Performed at [facility] Count     Final   Value: NO GROWTH     Performed at [facility]   Culture     Final   Value: NO GROWTH     Performed at [facility]   Report Status 08/27/2013 FINAL   Final    Coagulation Studies: No results found for this basename: LABPROT, INR,  in the last 72 hours  Imaging: Dg Chest 1 View  08/25/2013   CLINICAL DATA:  Altered mental status  EXAM: CHEST - 1 VIEW  COMPARISON:  Prior chest x-ray 02/27/2013  FINDINGS: Cardiomegaly with enlargement of the main pulmonary artery and possibly left atrial appendage. Atherosclerotic and tortuous thoracic aorta. The lungs are clear. Coarse central bronchitic changes and mild prominence of the interstitial markings appear similar compared to  prior. No acute osseous abnormality.  IMPRESSION: Enlargement of the main pulmonary artery and/or left atrial appendage. A left hilar mass is a less likely possibility. Recommend dedicated PA and lateral chest x-ray when the patient is able.  Otherwise, no active disease. Stable bronchitic changes and interstitial prominence.   Electronically Signed   By: Malachy Moan M.D.   On: 08/25/2013 13:46   Ct Head Wo Contrast  08/25/2013   CLINICAL DATA:  Altered mental status.  EXAM: CT HEAD WITHOUT CONTRAST  TECHNIQUE: Contiguous axial images were obtained from the base of the skull through the vertex without intravenous contrast.  COMPARISON:  02/27/2013  FINDINGS: No intracranial hemorrhage.  Prominent small vessel disease type changes without CT evidence of large acute infarct.  Global atrophy without hydrocephalus.  No intracranial mass lesion noted on this unenhanced exam.  .  Vascular calcifications.  Hyperostosis frontalis interna.  Polypoid opacification right frontal sinus. Mild mucosal thickening ethmoid sinus air cells.  IMPRESSION: No intracranial hemorrhage.  Prominent small vessel disease type changes without CT evidence of large acute infarct.  Global atrophy without hydrocephalus.  Polypoid opacification right frontal sinus. Mild mucosal thickening ethmoid sinus air cells.   Electronically Signed   By: Bridgett Larsson M.D.   On: 08/25/2013 13:13   Mr Laqueta Jean ZO Contrast  08/26/2013   CLINICAL DATA:  Increased confusion and agitation. Acute encephalopathy.  EXAM: MRI HEAD WITHOUT AND WITH CONTRAST  TECHNIQUE: Multiplanar, multiecho pulse sequences of the brain and surrounding structures were obtained according to standard protocol without and with intravenous contrast  CONTRAST:  10mL MULTIHANCE GADOBENATE DIMEGLUMINE 529 MG/ML IV SOLN  COMPARISON:  CT head without contrast 08/25/2013. MRI brain 04/27/2012.  FINDINGS: The midline structures are within normal limits. The diffusion-weighted images  demonstrate no evidence for acute or subacute infarction. The ventricles are proportionate to the degree of atrophy without significant interval change. Diffuse white matter changes are similar to the prior exam. Mild brainstem white matter changes are asymmetric on the right.  Diffuse signal abnormality is present within the thalami bilaterally. Remote lacunar infarcts are present within the basal ganglia bilaterally.  The postcontrast images are markedly degraded by patient motion. No pathologic enhancement is evident. Hyperostosis frontalis internus is again noted. Mucosal thickening and a fluid level is present within the lateral right frontal sinus. The remaining paranasal sinuses and the mastoid air cells are clear.  IMPRESSION: 1. Stable atrophy and diffuse white matter disease. This likely reflects the sequelae of chronic microvascular ischemia. 2. No acute intracranial abnormality or significant interval change. 3. New right frontal sinus disease.   Electronically Signed   By: Gennette Pac M.D.   On: 08/26/2013 17:50   Dg Foot Complete Right  08/25/2013   CLINICAL DATA:  Altered mental status  EXAM: RIGHT FOOT COMPLETE - 3+ VIEW  COMPARISON:  None.  FINDINGS: Three views of the right foot submitted. No acute fracture or subluxation. Hallux valgus deformity. Diffuse osteopenia. Mild degenerative erosive changes noted distal aspect 1st metatarsal. Plantar spurring of calcaneus.  IMPRESSION: No acute fracture or subluxation. Plantar spurring of calcaneus. Diffuse osteopenia. Hallux valgus deformity. Mild degenerative changes.   Electronically Signed   By: Natasha Mead M.D.   On: 08/25/2013  13:39    Medications:  I have reviewed the patient's current medications. Scheduled: . bisoprolol  5 mg Oral Daily  . cefTRIAXone (ROCEPHIN)  IV  1 g Intravenous Q24H  . docusate sodium  50 mg Oral BID  . donepezil  10 mg Oral Daily  . enoxaparin (LOVENOX) injection  30 mg Subcutaneous Q24H  . insulin aspart   0-9 Units Subcutaneous TID WC  . isosorbide-hydrALAZINE  1 tablet Oral BID  . latanoprost  1 drop Both Eyes QHS  . levothyroxine  50 mcg Oral QAC breakfast  . lisinopril  40 mg Oral Daily  . pantoprazole  40 mg Oral Daily  . polyethylene glycol  17 g Oral Daily  . sodium chloride  3 mL Intravenous Q12H  . vancomycin  500 mg Intravenous Q12H    Assessment/Plan: 79 year old with a possible underlying dementia admitted with fever and change in mental status.  Meningitis ruled out by LP.  Not fully oriented at this time.  Unclear about baseline.  BP improved.  Patient no longer febrile.  Herpes PCR pending but likelihood is small.  Mental status changes likely encephalopathy related to infection.   MRI of the brain reviewed and unremarkable for any acute changes.    Recommendations: 1.  Will follow up results of herpes PCR.   LOS: 2 days   Thana Farr, MD [facility] [phone] 08/27/2013  10:04 AM

## 2013-08-27 NOTE — Progress Notes (Signed)
INITIAL NUTRITION ASSESSMENT  DOCUMENTATION CODES Per approved criteria  -Not Applicable   INTERVENTION: Provide Ensure Pudding BID Provide Glucerna once daily  NUTRITION DIAGNOSIS: Predicted suboptimal energy intake related to dementia as evidenced by history of weight loss and history of poor PO intake per chart.   Goal: Pt to meet >/= 90% of their estimated nutrition needs   Monitor:  PO intake Weight Labs  Reason for Assessment: Malnutrition Screening Tool, score of 3  79 y.o. female  Admitting Dx: <principal problem not specified>  ASSESSMENT: 79 year old with a possible underlying dementia admitted with fever and change in mental status. Pt has history of type 2 diabetes and hypertension.  Pt working with PT at time of visit. Per nursing notes pt is eating 50% of meals. Per RN pt did not eat breakfast this morning because she was asleep. Pt seen by dietitians during previous admissions- pt has history of poor/varied PO intake with need for nutritional supplements. Pt has history if weight loss- see weight history below.  Height: Ht Readings from Last 1 Encounters:  08/26/13 5\' 5"  (1.651 m)    Weight: Wt Readings from Last 1 Encounters:  08/27/13 119 lb 7.8 oz (54.2 kg)    Ideal Body Weight: 125 lbs  % Ideal Body Weight: 95%  Wt Readings from Last 10 Encounters:  08/27/13 119 lb 7.8 oz (54.2 kg)  07/18/13 114 lb 3.2 oz (51.801 kg)  03/01/13 121 lb 4 oz (54.999 kg)  07/08/12 125 lb 9.6 oz (56.972 kg)  06/16/12 133 lb (60.328 kg)  06/10/12 140 lb 6.9 oz (63.7 kg)  06/10/12 140 lb 6.9 oz (63.7 kg)  05/02/12 143 lb 1.6 oz (64.91 kg)  03/31/12 160 lb (72.576 kg)  03/31/12 160 lb (72.576 kg)    Usual Body Weight: 160 lbs (May 2013)  % Usual Body Weight: 74%  BMI:  Body mass index is 19.88 kg/(m^2).  Estimated Nutritional Needs: Kcal: 1300-1500 Protein: 60-70 grams Fluid: 1.6 L/day  Skin: intact  Diet Order: Carb Control  EDUCATION NEEDS: -No  education needs identified at this time   Intake/Output Summary (Last 24 hours) at 08/27/13 1029 Last data filed at 08/27/13 1023  Gross per 24 hour  Intake    755 ml  Output    201 ml  Net    554 ml    Last BM: 10/26, small  Labs:   Recent Labs Lab 08/25/13 1245 08/25/13 2112 08/26/13 0450 08/27/13 0500  NA 136  --  138 136  K 4.0  --  3.1* 4.0  CL 98  --  102 99  CO2 25  --  24 23  BUN 13  --  11 12  CREATININE 0.62 0.59 0.64 0.61  CALCIUM 10.0  --  9.1 9.6  MG  --   --   --  1.7  GLUCOSE 126*  --  122* 131*    CBG (last 3)   Recent Labs  08/26/13 1714 08/26/13 2128 08/27/13 0623  GLUCAP 105* 146* 122*    Scheduled Meds: . bisoprolol  5 mg Oral Daily  . cefTRIAXone (ROCEPHIN)  IV  1 g Intravenous Q24H  . docusate  50 mg Oral BID  . donepezil  10 mg Oral Daily  . enoxaparin (LOVENOX) injection  30 mg Subcutaneous Q24H  . insulin aspart  0-9 Units Subcutaneous TID WC  . isosorbide-hydrALAZINE  1 tablet Oral BID  . latanoprost  1 drop Both Eyes QHS  . levothyroxine  50 mcg Oral QAC breakfast  . lisinopril  40 mg Oral Daily  . pantoprazole  40 mg Oral Daily  . polyethylene glycol  17 g Oral Daily  . sodium chloride  3 mL Intravenous Q12H  . vancomycin  500 mg Intravenous Q12H    Continuous Infusions:   Past Medical History  Diagnosis Date  . Hypertension   . Diastolic dysfunction, left ventricle by ECHO 2011 03/25/2012  . Hypothyroidism 03/25/2012  . High cholesterol   . Type II diabetes mellitus   . H/O hiatal hernia   . Duodenal perforation 03/23/12  . Arthritis     "in my back"    Past Surgical History  Procedure Laterality Date  . Bladder tacked    . Repair perforated ulcer  03/23/12  . Diagnostic laparoscopy  03/25/12    REPAIR OF PERFORATED ULCER with omental patch  . Abcess drainage  03/25/12     of abdominal & pelvic abscesses  . Laparoscopy  03/24/2012    Procedure: LAPAROSCOPY DIAGNOSTIC;  Surgeon: Ardeth Sportsman, MD;  Location: [facility]  OR;  Service: General;  Laterality: N/A;  drainage of abdominal and pelvic abcesses  . Laparotomy  06/03/2012    Procedure: EXPLORATORY LAPAROTOMY;  Surgeon: Mariella Saa, MD;  Location: [facility] OR;  Service: General;  Laterality: N/A;    Ian Malkin RD, LDN Inpatient Clinical Dietitian Pager: [phone] After Hours Pager: [phone]

## 2013-08-27 NOTE — Progress Notes (Addendum)
Physical Therapy Evaluation Patient Details Name: Megan [name] MRN: [id number] DOB: 11/26/1926 Today's Date: 08/27/2013 Time: 4098-1191 PT Time Calculation (min): 21 min  PT Assessment / Plan / Recommendation History of Present Illness  Patient is a 79 y.o. female presenting with dementia, acute encephalopathy and r/o meningitis.  On Droplet precautions.    Clinical Impression  Pt admitted with above. Pt currently with functional limitations due to the deficits listed below (see PT Problem List). Very limited eval due to lethargy.  Pt will need 24 hour care.  If no 24 hours Care, recommend NHP.  Pt will benefit from skilled PT to increase their independence and safety with mobility to allow discharge to the venue listed below.     PT Assessment  Patient needs continued PT services    Follow Up Recommendations  SNF;Supervision/Assistance - 24 hour         Barriers to Discharge Decreased caregiver support      Equipment Recommendations  Other (comment) (TBA)         Frequency Min 3X/week    Precautions / Restrictions Precautions Precautions: Fall Restrictions Weight Bearing Restrictions: No   Pertinent Vitals/Pain VSS, No pain      Mobility Bed Mobility Bed Mobility: Rolling Right;Right Sidelying to Sit;Sitting - Scoot to Edge of Bed Rolling Right: 3: Mod assist;With rail Right Sidelying to Sit: 2: Max assist;HOB elevated;With rails Sitting - Scoot to Edge of Bed: 3: Mod assist Details for Bed Mobility Assistance: Needed cues for sequencing movement.  Took incr time to get to EOB.   Transfers Transfers: Sit to Stand;Stand to Sit;Squat Pivot Transfers Sit to Stand: 1: +1 Total assist;Without upper extremity assist;From bed Stand to Sit: 1: +1 Total assist;Without upper extremity assist;To bed Squat Pivot Transfers: 2: Max assist;With upper extremity assistance Details for Transfer Assistance: Attempted sit to stand but pt could not stand to RW or just with PT.   Performed squat pivot transfer since pt was unable to perform sit to stand.  Pt able to weight bear on LEs but PT did majority of transfer.  Pt was wet with urine therefore this PT changed pts gown and cleaned pt's skin.   Ambulation/Gait Ambulation/Gait Assistance: Not tested (comment) Stairs: No Wheelchair Mobility Wheelchair Mobility: No         PT Diagnosis: Generalized weakness  PT Problem List: Decreased activity tolerance;Decreased balance;Decreased mobility;Decreased knowledge of use of DME;Decreased safety awareness;Decreased knowledge of precautions PT Treatment Interventions: DME instruction;Gait training;Functional mobility training;Therapeutic activities;Therapeutic exercise;Balance training;Patient/family education     PT Goals(Current goals can be found in the care plan section) Acute Rehab PT Goals Patient Stated Goal: unsure as pt lethargic and no family present PT Goal Formulation: With patient Time For Goal Achievement: 09/10/13 Potential to Achieve Goals: Good  Visit Information  Last PT Received On: 08/27/13 Assistance Needed: +2 History of Present Illness: Patient is a 79 y.o. female presenting with dementia, acute encephalopathy and r/o meningitis.  On Droplet precautions.         Prior Functioning  Home Living Family/patient expects to be discharged to:: Private residence Living Arrangements: Spouse/significant other Available Help at Discharge: Available PRN/intermittently;Family (spouse disabled in w/c, son works at night; PCA 9-4 M-F) Type of Home: House Home Access: Stairs to enter Secretary/administrator of Steps: 3-5 Entrance Stairs-Rails: Can reach both Home Layout: One level Home Equipment: Walker - 2 wheels;Wheelchair - manual Additional Comments: no family present.  History provided by previous admit Prior Function Level of Independence: Needs  assistance Gait / Transfers Assistance Needed: unsure ADL's / Homemaking Assistance Needed: son  does cooking Comments: on previous admit pt family reports they cannot provide 24 hour care. Communication Communication: Other (comment) (not speaking much) Dominant Hand: Right    Cognition  Cognition Arousal/Alertness: Lethargic Behavior During Therapy: Flat affect Overall Cognitive Status: Impaired/Different from baseline Area of Impairment: Orientation;Following commands;Safety/judgement;Problem solving Orientation Level: Disoriented to;Situation;Time;Place Following Commands: Follows one step commands inconsistently;Follows one step commands with increased time Safety/Judgement: Decreased awareness of deficits;Decreased awareness of safety Problem Solving: Slow processing;Decreased initiation;Difficulty sequencing;Requires verbal cues;Requires tactile cues General Comments: Pt not very responsive to commands today.    Extremity/Trunk Assessment Upper Extremity Assessment Upper Extremity Assessment: Defer to OT evaluation Lower Extremity Assessment Lower Extremity Assessment: Generalized weakness;RLE deficits/detail;LLE deficits/detail RLE Deficits / Details: appears grossly 2+/5 LLE Deficits / Details: Appears grossly 2+/5   Balance Balance Balance Assessed: Yes Static Sitting Balance Static Sitting - Balance Support: Bilateral upper extremity supported;Feet supported Static Sitting - Level of Assistance: 4: Min assist Static Sitting - Comment/# of Minutes: Pt sat 5 min with right lateral lean.  Could sit upright with cues but tends to have right lateral lean if not constantly cued.    End of Session PT - End of Session Equipment Utilized During Treatment: Gait belt Activity Tolerance: Patient limited by lethargy Patient left: in chair;with call bell/phone within reach;with chair alarm set Nurse Communication: Mobility status;Need for lift equipment       INGOLD,Jaira Canady 08/27/2013, 10:50 AM [facility] [phone] (pager)

## 2013-08-28 DIAGNOSIS — R5381 Other malaise: Secondary | ICD-10-CM

## 2013-08-28 DIAGNOSIS — J9600 Acute respiratory failure, unspecified whether with hypoxia or hypercapnia: Secondary | ICD-10-CM

## 2013-08-28 LAB — BASIC METABOLIC PANEL
BUN: 11 mg/dL (ref 6–23)
Calcium: 9.2 mg/dL (ref 8.4–10.5)
Creatinine, Ser: 0.67 mg/dL (ref 0.50–1.10)
GFR calc Af Amer: >90 mL/min (ref >90)
GFR calc non Af Amer: 79 mL/min — ABNORMAL LOW (ref >90)
Potassium: 3.4 mEq/L — ABNORMAL LOW (ref 3.5–5.1)
Sodium: 139 mEq/L (ref 135–145)

## 2013-08-28 LAB — GLUCOSE, CAPILLARY
Glucose-Capillary: 197 mg/dL — ABNORMAL HIGH (ref 70–99)
Glucose-Capillary: 117 mg/dL — ABNORMAL HIGH (ref 70–99)
Glucose-Capillary: 142 mg/dL — ABNORMAL HIGH (ref 70–99)

## 2013-08-28 MED ORDER — ISOSORB DINITRATE-HYDRALAZINE 20-37.5 MG PO TABS
1.0000 | ORAL_TABLET | Freq: Three times a day (TID) | ORAL | Status: DC
  Administered 2013-08-28 – 2013-08-29 (×2): 1 via ORAL
  Filled 2013-08-28 (×4): qty 1

## 2013-08-28 NOTE — Progress Notes (Signed)
NEURO HOSPITALIST PROGRESS NOTE   SUBJECTIVE:                                                                                                                         Patient awake, following commands, not oriented to place or year but able to recall her birth day, year and place of birth without difficulty.  OBJECTIVE:                                                                                                                           Vital signs in last 24 hours: Temp:  [98 F (36.7 C)-98.7 F (37.1 C)] 98 F (36.7 C) (10/27 0545) Pulse Rate:  [62-70] 65 (10/27 0545) Resp:  [17-20] 17 (10/27 0545) BP: (115-185)/(38-97) 159/57 mmHg (10/27 1124) SpO2:  [96 %-100 %] 98 % (10/27 0545) Weight:  [53.7 kg (118 lb 6.2 oz)] 53.7 kg (118 lb 6.2 oz) (10/27 0545)  Intake/Output from previous day: 10/26 0701 - 10/27 0700 In: 275 [IV Piggyback:100] Out: -  Intake/Output this shift: Total I/O In: 360 [P.O.:360] Out: -  Nutritional status: Carb Control  Past Medical History  Diagnosis Date  . Hypertension   . Diastolic dysfunction, left ventricle by ECHO 2011 03/25/2012  . Hypothyroidism 03/25/2012  . High cholesterol   . Type II diabetes mellitus   . H/O hiatal hernia   . Duodenal perforation 03/23/12  . Arthritis     "in my back"      Neurologic Exam:   Mental Status:  Awake and able to recall distant dates and places but refuses to tell me where she is now ("due to everyone asking her") Follows commands.  Fluent speech.  Cranial Nerves:  II: counts fingers bilaterally, pupils equal, round, reactive to light and accommodation  III,IV, VI: EOM's grossly intact  V,VII: face asymmetric on the left, sensation intact bilateral  VIII: hearing normal bilaterally  IX,X: gag reflex present  XI: bilateral shoulder shrug  XII: midline tongue extension  Motor:  Moves all extremities against gravity  Sensory: Intact to light stimulation  throughout  Deep Tendon Reflexes:  2+ throughout with 1+ AJ's bilaterally  Plantars:  Up going bilaterally     Lab Results: Lab Results  Component Value Date/Time   CHOL  Value: 163        ATP III CLASSIFICATION:  <200     mg/dL   Desirable  409-811  mg/dL   Borderline High  >=914    mg/dL   High        7/82/9562  1:25 AM   Lipid Panel No results found for this basename: CHOL, TRIG, HDL, CHOLHDL, VLDL, LDLCALC,  in the last 72 hours  Studies/Results: Mr Laqueta Jean Wo Contrast  08/26/2013   CLINICAL DATA:  Increased confusion and agitation. Acute encephalopathy.  EXAM: MRI HEAD WITHOUT AND WITH CONTRAST  TECHNIQUE: Multiplanar, multiecho pulse sequences of the brain and surrounding structures were obtained according to standard protocol without and with intravenous contrast  CONTRAST:  10mL MULTIHANCE GADOBENATE DIMEGLUMINE 529 MG/ML IV SOLN  COMPARISON:  CT head without contrast 08/25/2013. MRI brain 04/27/2012.  FINDINGS: The midline structures are within normal limits. The diffusion-weighted images demonstrate no evidence for acute or subacute infarction. The ventricles are proportionate to the degree of atrophy without significant interval change. Diffuse white matter changes are similar to the prior exam. Mild brainstem white matter changes are asymmetric on the right.  Diffuse signal abnormality is present within the thalami bilaterally. Remote lacunar infarcts are present within the basal ganglia bilaterally.  The postcontrast images are markedly degraded by patient motion. No pathologic enhancement is evident. Hyperostosis frontalis internus is again noted. Mucosal thickening and a fluid level is present within the lateral right frontal sinus. The remaining paranasal sinuses and the mastoid air cells are clear.  IMPRESSION: 1. Stable atrophy and diffuse white matter disease. This likely reflects the sequelae of chronic microvascular ischemia. 2. No acute intracranial abnormality or significant  interval change. 3. New right frontal sinus disease.   Electronically Signed   By: Gennette Pac M.D.   On: 08/26/2013 17:50    MEDICATIONS                                                                                                                        Scheduled: . aspirin  81 mg Oral Daily  . bisoprolol  5 mg Oral Daily  . cefTRIAXone (ROCEPHIN)  IV  1 g Intravenous Q24H  . docusate  50 mg Oral BID  . donepezil  10 mg Oral Daily  . enoxaparin (LOVENOX) injection  40 mg Subcutaneous QHS  . feeding supplement (ENSURE)  1 Container Oral BID PC  . feeding supplement (GLUCERNA SHAKE)  237 mL Oral Q24H  . insulin aspart  0-9 Units Subcutaneous TID WC  . isosorbide-hydrALAZINE  1 tablet Oral BID  . latanoprost  1 drop Both Eyes QHS  . levothyroxine  50 mcg Oral QAC breakfast  . lisinopril  40 mg Oral Daily  . pantoprazole  40 mg Oral Daily  . polyethylene glycol  17 g Oral Daily  . sodium chloride  3 mL Intravenous Q12H    ASSESSMENT/PLAN:  79 year old with a possible underlying dementia admitted with fever and change in mental status. Meningitis ruled out by LP. HSV PCR negative. Mental status changes likely encephalopathy related to infection.   Recommend: 1) Continue to treat underlying infection 2) No further neurology recommendations.   S/O  Assessment and plan discussed with with attending physician and they are in agreement.    Felicie Morn PA-C [facility] [phone]  08/28/2013, 12:07 PM

## 2013-08-28 NOTE — Progress Notes (Signed)
[facility] PROGRESS NOTE  Megan [name] WUJ:[id number] DOB: 1927/06/11 DOA: 08/25/2013 PCP: [name], MD  Assessment/Plan: 1-acute toxic/metabolic encephalopathy: Most likely secondary to presumed UTI and potentially hypertensive encephalopathy. -Ammonia level and TSH within normal limits -CT and MRI of the head without acute abnormalities -Urine culture w/o growth but patient unfortunately started on abx's by the collection was made. No growth on blood cultures so far -Patient mentation has improved and at baseline after her blood pressure was controlled, fluid resuscitation given and she was started on antibiotics -Will continue abx's for UTI; discontinue vanc -doubt meningitis; discontinued droplets precautions -negative herpes PCR -No abnormalities on telemetry; tele d/c  2-presumed UTI: Patient with increased frequency and fever. -continue rocephin while inpatient -cx's w/o any growth, but patient's urine sent for culture after abx's on board -will treat for a total of 4 days.  3-Accelerated hypertension: Well controlled at this point. -Further adjustment to her antihypertensive regimen to be done as needed  4-hypothyroidism: TSH within normal limits. Continue Synthroid  5-diabetes mellitus type 2: Modified carbohydrate diet and sliding scale insulin. Most recent A1c demonstrated a level 6.3 and no active treatment as an outpatient.  6-constipation: Will continue MiraLAX and Colace; prn sorbitol ordered as well  7-dementia: continue aricept.  8-GERD: Continue PPI  9-hypokalemia: repleted.  10-physical deconditioning: PT recommending SNF. Will discussed options with family members, patient and SW  DVT: lovenox.  Code Status: Full Family Communication: daughter at bedside Disposition Plan: family has agree for SNF  Consultants:  Neurology   Procedures:  See below for x-ray reports  LP done on 08/25/13 (final cx report pending)  MRI  pending  Antibiotics:  vanc and rocephin  HPI/Subjective: Patient afebrile; AAOX2. Slightly restless today and looking to get out of bed w/o assistance (high fall risk). Will monitor.  Objective: Filed Vitals:   08/28/13 1346  BP: 152/59  Pulse: 56  Temp: 98.5 F (36.9 C)  Resp: 18    Intake/Output Summary (Last 24 hours) at 08/28/13 1918 Last data filed at 08/28/13 1300  Gross per 24 hour  Intake    600 ml  Output      0 ml  Net    600 ml   Filed Weights   08/26/13 0552 08/27/13 0537 08/28/13 0545  Weight: 54 kg (119 lb 0.8 oz) 54.2 kg (119 lb 7.8 oz) 53.7 kg (118 lb 6.2 oz)    Exam:   General:  NAD, AAOX2, afebrile  Cardiovascular: S1 and S2, no rubs or gallops  Respiratory: CTA bilaterally  Abdomen: soft, NT, ND, positive BS  Musculoskeletal: no edema or cyanosis  Data Reviewed: Basic Metabolic Panel:  Recent Labs Lab 08/25/13 1245 08/25/13 2112 08/26/13 0450 08/27/13 0500 08/28/13 0520  NA 136  --  138 136 139  K 4.0  --  3.1* 4.0 3.4*  CL 98  --  102 99 104  CO2 25  --  24 23 27   GLUCOSE 126*  --  122* 131* 132*  BUN 13  --  11 12 11   CREATININE 0.62 0.59 0.64 0.61 0.67  CALCIUM 10.0  --  9.1 9.6 9.2  MG  --   --   --  1.7  --    Liver Function Tests:  Recent Labs Lab 08/25/13 1245 08/26/13 0450  AST 23 17  ALT 11 8  ALKPHOS 78 55  BILITOT 0.8 0.9  PROT 7.9 6.3  ALBUMIN 3.7 2.9*    Recent Labs Lab 08/25/13 2112  AMMONIA 33   CBC:  Recent Labs Lab 08/25/13 1245 08/25/13 2112 08/26/13 0450  WBC 7.9 7.8 6.6  NEUTROABS 6.2  --   --   HGB 14.7 13.0 11.9*  HCT 43.1 38.2 35.6*  MCV 87.8 88.8 88.8  PLT PLATELET CLUMPS NOTED ON SMEAR, COUNT APPEARS ADEQUATE 131* 138*   CBG:  Recent Labs Lab 08/27/13 1731 08/27/13 2125 08/28/13 0655 08/28/13 1127 08/28/13 1553  GLUCAP 151* 120* 117* 139* 142*    Recent Results (from the past 240 hour(s))  CULTURE, BLOOD (ROUTINE X 2)     Status: None   Collection Time     08/25/13 12:30 PM      Result Value Range Status   Specimen Description BLOOD RIGHT HAND   Final   Special Requests BOTTLES DRAWN AEROBIC ONLY 10CC   Final   Culture  Setup Time     Final   Value: 08/25/2013 16:28     Performed at [facility]   Culture     Final   Value:        BLOOD CULTURE RECEIVED NO GROWTH TO DATE CULTURE WILL BE HELD FOR 5 DAYS BEFORE ISSUING A FINAL NEGATIVE REPORT     Performed at [facility]   Report Status PENDING   Incomplete  CULTURE, BLOOD (ROUTINE X 2)     Status: None   Collection Time    08/25/13 12:45 PM      Result Value Range Status   Specimen Description BLOOD LEFT ANTECUBITAL   Final   Special Requests     Final   Value: BOTTLES DRAWN AEROBIC AND ANAEROBIC 10CC AERO, 7CC ANA   Culture  Setup Time     Final   Value: 08/25/2013 16:27     Performed at [facility]   Culture     Final   Value:        BLOOD CULTURE RECEIVED NO GROWTH TO DATE CULTURE WILL BE HELD FOR 5 DAYS BEFORE ISSUING A FINAL NEGATIVE REPORT     Performed at [facility]   Report Status PENDING   Incomplete  CSF CULTURE     Status: None   Collection Time    08/25/13  4:37 PM      Result Value Range Status   Specimen Description CSF   Final   Special Requests 1.0ML FLUID   Final   Gram Stain     Final   Value: WBC PRESENT, PREDOMINANTLY MONONUCLEAR     NO ORGANISMS SEEN     Performed at [facility] CYTOSPIN     Performed at [facility]   Culture     Final   Value: NO GROWTH 3 DAYS     Performed at [facility]   Report Status PENDING   Incomplete  GRAM STAIN     Status: None   Collection Time    08/25/13  4:37 PM      Result Value Range Status   Specimen Description CSF   Final   Special Requests 1.0ML FLUID   Final   Gram Stain     Final   Value: CYTOSPIN PREP     WBC PRESENT, PREDOMINANTLY MONONUCLEAR     NO ORGANISMS SEEN   Report Status 08/25/2013 FINAL   Final  URINE CULTURE     Status: None    Collection Time    08/26/13  8:39 AM      Result Value Range Status  Specimen Description URINE, RANDOM   Final   Special Requests NONE   Final   Culture  Setup Time     Final   Value: 08/26/2013 13:22     Performed at [facility] Count     Final   Value: NO GROWTH     Performed at [facility]   Culture     Final   Value: NO GROWTH     Performed at [facility]   Report Status 08/27/2013 FINAL   Final     Studies: No results found.  Scheduled Meds: . aspirin  81 mg Oral Daily  . bisoprolol  5 mg Oral Daily  . cefTRIAXone (ROCEPHIN)  IV  1 g Intravenous Q24H  . docusate  50 mg Oral BID  . donepezil  10 mg Oral Daily  . enoxaparin (LOVENOX) injection  40 mg Subcutaneous QHS  . feeding supplement (ENSURE)  1 Container Oral BID PC  . feeding supplement (GLUCERNA SHAKE)  237 mL Oral Q24H  . insulin aspart  0-9 Units Subcutaneous TID WC  . isosorbide-hydrALAZINE  1 tablet Oral BID  . latanoprost  1 drop Both Eyes QHS  . levothyroxine  50 mcg Oral QAC breakfast  . lisinopril  40 mg Oral Daily  . pantoprazole  40 mg Oral Daily  . polyethylene glycol  17 g Oral Daily  . sodium chloride  3 mL Intravenous Q12H   Continuous Infusions:    Active Problems:   DM (diabetes mellitus), type 2 with complications   Hypertension   Dementia   Acute encephalopathy   UTI (lower urinary tract infection)    Time spent: >30 minutes   Balin Vandegrift  [facility] Pager [phone]. If 7PM-7AM, please contact night-coverage at [email or web address], password [facility] 08/28/2013, 7:18 PM  LOS: 3 days

## 2013-08-28 NOTE — Progress Notes (Signed)
Patient has been up in recliner chair half of the day. Patient had been trying to get out of bed without assistance. Patient had been in recliner chair at the nurses desk to assist with monitoring patient to prevent any falls. Was notified by charge nurse that patient will be going to SNF when available. Currently patient's family members are present in room with patient. Patient complains of no pain or discomfort. Patient has had a large bowel movement and was able to use the bedside commode with one person assist. Will continue to monitor to end of shift.

## 2013-08-29 LAB — GLUCOSE, CAPILLARY
Glucose-Capillary: 117 mg/dL — ABNORMAL HIGH (ref 70–99)
Glucose-Capillary: 111 mg/dL — ABNORMAL HIGH (ref 70–99)

## 2013-08-29 LAB — CSF CULTURE W GRAM STAIN

## 2013-08-29 LAB — CSF CULTURE

## 2013-08-29 MED ORDER — GLUCERNA SHAKE PO LIQD: 237.0000 mL | Freq: Two times a day (BID) | ORAL | Status: DC

## 2013-08-29 MED ORDER — ENSURE PUDDING PO PUDG: 1.0000 | Freq: Two times a day (BID) | ORAL | Status: DC

## 2013-08-29 MED ORDER — DOCUSATE SODIUM 50 MG/5ML PO LIQD: 100.0000 mg | Freq: Two times a day (BID) | ORAL | Status: DC

## 2013-08-29 MED ORDER — CEFUROXIME AXETIL 500 MG PO TABS
500.0000 mg | ORAL_TABLET | Freq: Two times a day (BID) | ORAL | Status: DC
  Filled 2013-08-29 (×2): qty 1

## 2013-08-29 MED ORDER — CEFUROXIME AXETIL 500 MG PO TABS: 500.0000 mg | ORAL_TABLET | Freq: Two times a day (BID) | ORAL | Status: AC

## 2013-08-29 MED ORDER — ISOSORB DINITRATE-HYDRALAZINE 20-37.5 MG PO TABS: 1.0000 | ORAL_TABLET | Freq: Three times a day (TID) | ORAL | Status: DC

## 2013-08-29 MED ORDER — CLORAZEPATE DIPOTASSIUM 3.75 MG PO TABS: 3.7500 mg | ORAL_TABLET | Freq: Three times a day (TID) | ORAL | Status: DC | PRN

## 2013-08-29 MED ORDER — ASPIRIN 81 MG PO CHEW: 81.0000 mg | CHEWABLE_TABLET | Freq: Every day | ORAL | Status: DC

## 2013-08-29 MED ORDER — POLYETHYLENE GLYCOL 3350 17 G PO PACK: 17.0000 g | PACK | Freq: Every day | ORAL | Status: DC

## 2013-08-29 MED ORDER — BISOPROLOL FUMARATE 5 MG PO TABS: 5.0000 mg | ORAL_TABLET | Freq: Every day | ORAL | Status: DC

## 2013-08-29 NOTE — Progress Notes (Signed)
The patient tried to get out of the chair without assistance multiple times at the beginning of the shift.  Once getting into bed at 2300, she did not try to get out of the bed without assistance.  She stated that she slept well and appears to be more oriented this morning.

## 2013-08-29 NOTE — Progress Notes (Signed)
Report given to nurse Patsy Lager at [place]. Patient has already arrived to facility. Patient care will continue at [facility] facility

## 2013-08-29 NOTE — Progress Notes (Signed)
Megan [name] to be D/C'd Skilled nursing facility per MD order.  Discussed with the patient and all questions fully answered.    Medication List    STOP taking these medications       aspirin 325 MG tablet  Replaced by:  aspirin 81 MG chewable tablet     bisoprolol-hydrochlorothiazide 5-6.25 MG per tablet  Commonly known as:  ZIAC      TAKE these medications       aspirin 81 MG chewable tablet  Chew 1 tablet (81 mg total) by mouth daily.     bisoprolol 5 MG tablet  Commonly known as:  ZEBETA  Take 1 tablet (5 mg total) by mouth daily.     cefUROXime 500 MG tablet  Commonly known as:  CEFTIN  Take 1 tablet (500 mg total) by mouth 2 (two) times daily with a meal. Take medications for 4 more days     clorazepate 3.75 MG tablet  Commonly known as:  TRANXENE  Take 1 tablet (3.75 mg total) by mouth 3 (three) times daily as needed for anxiety.     docusate 50 MG/5ML liquid  Commonly known as:  COLACE  Take 10 mLs (100 mg total) by mouth 2 (two) times daily.     donepezil 10 MG tablet  Commonly known as:  ARICEPT  Take 10 mg by mouth daily.     feeding supplement (ENSURE) Pudg  Take 1 Container by mouth 2 (two) times daily after a meal.     feeding supplement (GLUCERNA SHAKE) Liqd  Take 237 mLs by mouth 2 (two) times daily between meals.     isosorbide-hydrALAZINE 20-37.5 MG per tablet  Commonly known as:  BIDIL  Take 1 tablet by mouth 3 (three) times daily.     levothyroxine 50 MCG tablet  Commonly known as:  SYNTHROID, LEVOTHROID  Take 50 mcg by mouth daily before breakfast.     lisinopril 40 MG tablet  Commonly known as:  PRINIVIL,ZESTRIL  Take 40 mg by mouth daily.     pantoprazole 40 MG tablet  Commonly known as:  PROTONIX  Take 40 mg by mouth daily.     polyethylene glycol packet  Commonly known as:  MIRALAX / GLYCOLAX  Take 17 g by mouth daily.     potassium chloride SA 20 MEQ tablet  Commonly known as:  K-DUR,KLOR-CON  Take 20 mEq by mouth 2 (two)  times daily.     Travoprost (BAK Free) 0.004 % Soln ophthalmic solution  Commonly known as:  TRAVATAN  Place 1 drop into both eyes at bedtime.     Vitamin D 2000 UNITS tablet  Take 2,000 Units by mouth daily.        VVS, Skin clean, dry and intact without evidence of skin break down, no evidence of skin tears noted. IV catheter discontinued intact. Site without signs and symptoms of complications. Dressing and pressure applied.  Patient escorted via stretcher, and D/C SNF via ambulance.  Megan [name] 08/29/2013 3:35 PM

## 2013-08-29 NOTE — Progress Notes (Signed)
Physical Therapy Treatment Patient Details Name: Megan [name] MRN: [id number] DOB: 17-Jun-1927 Today's Date: 08/29/2013 Time: 4696-2952 PT Time Calculation (min): 24 min  PT Assessment / Plan / Recommendation  History of Present Illness Patient is a 79 y.o. female presenting with dementia, acute encephalopathy and r/o meningitis-  Negative with LP.   PT Comments   Pt making good progress toward her goals with her gait goal met today. Still presents as a fall risk with her current cognitive status and will need 24 hour assistance for safety.   Follow Up Recommendations  SNF;Supervision/Assistance - 24 hour     Equipment Recommendations  None recommended by PT    Frequency Min 3X/week   Progress towards PT Goals Progress towards PT goals: Progressing toward goals  Plan Current plan remains appropriate    Precautions / Restrictions Precautions Precautions: Fall Restrictions Weight Bearing Restrictions: No    Mobility  Bed Mobility Bed Mobility: Not assessed Details for Bed Mobility Assistance: pt in recliner before and after session at nurses station due to fall risk/multiple attempts to get up unassisted Transfers Sit to Stand: 4: Min assist;From chair/3-in-1;With upper extremity assist;With armrests Stand to Sit: 4: Min assist;To chair/3-in-1;With armrests;With upper extremity assist Details for Transfer Assistance: sit<>stand transfer performed 4 reps for strengtheing, education and pericare with min assist and cues for hand placement and general safety with transfers. Ambulation/Gait Ambulation/Gait Assistance: 4: Min assist Ambulation Distance (Feet): 230 Feet Assistive device: Rolling walker Ambulation/Gait Assistance Details: assist for balance, walker management and max cues for pt to keep her hands on walker in halls-not on walls or rails. pt with several episodes of sudden stops/turns to see what was going on (easily distracted) and a couple of times pt would laugh  at something in which she would fold forward at the hips over the walker- all of this contributed to her needing min assist for balance/safety with gait                     Gait Pattern: Step-through pattern;Decreased stride length;Narrow base of support;Trunk flexed Stairs: No    Exercises General Exercises - Lower Extremity Ankle Circles/Pumps: AROM;Both;10 reps;Seated;Limitations Ankle Circles/Pumps Limitations: multimodal cues to task Quad Sets: AROM;Strengthening;10 reps;Both;Seated;Limitations Quad Sets Limitations: multimodal cues to stay on task Long [employer]: AROM;Strengthening;Both;10 reps;Seated;Limitations Long [employer] Limitations: needed visual and verbal cues to keep going with this exercise correctly Hip Flexion/Marching: AROM;Strengthening;Both;10 reps;Seated;Limitations Hip Flexion/Marching Limitations: same cues as with LAQ's above      PT Goals (current goals can now be found in the care plan section)   Visit Information  Last PT Received On: 08/29/13 Assistance Needed: +2 History of Present Illness: Patient is a 79 y.o. female presenting with dementia, acute encephalopathy and r/o meningitis-  Negative with LP.       Cognition  Cognition Arousal/Alertness: Awake/alert Behavior During Therapy: Restless;Impulsive Overall Cognitive Status: Impaired/Different from baseline Area of Impairment: Orientation;Memory;Safety/judgement;Awareness;Problem solving;Following commands Orientation Level: Disoriented to;Time;Situation;Place Following Commands: Follows one step commands with increased time;Follows one step commands inconsistently Safety/Judgement: Decreased awareness of safety;Decreased awareness of deficits Awareness: Intellectual Problem Solving: Slow processing;Decreased initiation;Difficulty sequencing;Requires verbal cues;Requires tactile cues General Comments: required multiple cues for safety with session today. Impulsive at times during gait.       End  of Session PT - End of Session Equipment Utilized During Treatment: Gait belt Activity Tolerance: Patient tolerated treatment well Patient left: in chair;Other (comment) (at nursing station with nursing staff) Nurse Communication: Mobility  status   GP     Sallyanne Kuster 08/29/2013, 10:28 AM  Sallyanne Kuster, PTA Office- [phone]

## 2013-08-29 NOTE — Discharge Summary (Signed)
Physician Discharge Summary  Megan [name] ZOX:[id number] DOB: Jul 30, 1927 DOA: 08/25/2013  PCP: [name], MD  Admit date: 08/25/2013 Discharge date: 08/29/2013  Time spent: >30 minutes  Recommendations for Outpatient Follow-up:  1. BMET to follow electrolytes and renal function 2. Reassess BP and adjust medications as needed  Discharge Diagnoses:  Active Problems:   DM (diabetes mellitus), type 2 with complications   Hypertension   Dementia   Acute encephalopathy   UTI (lower urinary tract infection) Severe protein malnutrition  Physical deconditioning GERD Hypokalemia Constipation   Discharge Condition: stable and improved. No fever and mentation back to baseline. Will discharge to SNF for further care and physical rehab.  Diet recommendation: heart healthy and modified carb diet  Filed Weights   08/27/13 0537 08/28/13 0545 08/29/13 0556  Weight: 54.2 kg (119 lb 7.8 oz) 53.7 kg (118 lb 6.2 oz) 53.3 kg (117 lb 8.1 oz)    History of present illness:  79 y.o. female with a past medical history of cognitive impairment who was recently discharged from the medicine service on 07/17/2013 at which time patient was worked up for abdominal pain. Symptoms resolving, unclear etiology. She was brought in today for significant mental status changes. Family members stating she had a good day yesterday, feeling well, interactive, when out shopping with her daughters. At nighttime her son stated talking to her over the telephone, describing her as "not sounding right." Patient seemed to be more confused and disoriented. This morning patient having a steep decline, when she did not come down for breakfast, family found her in her room nonverbal, unable to follow commands, having generalized weakness as she did not get out of bed. EMS was called, found her in bed covered in urine. Family members reporting no recent medication changes. In the emergency department she was found to be  febrile having a temperature of 102.4, coming down to 101. Initial workup in the emergency department was unremarkable, with chest x-ray not show an obvious infiltrate, urinalysis showing small amount of leukocytes with rare bacteria. Serum lactate was within normal limits. She was administered a dose of ceftriaxone. I discussed case with neurology, requested a consultation. With lack of obvious source of infection, lumbar puncture was performed in emergent department.   Hospital Course:  1-acute toxic/metabolic encephalopathy: Most likely secondary to presumed UTI and potentially hypertensive encephalopathy.  -Ammonia level and TSH within normal limits  -CT and MRI of the head without acute abnormalities  -Urine culture w/o growth but patient unfortunately started on abx's by the collection was made. No growth on blood cultures so far. -Patient mentation has improved and at baseline after her blood pressure was controlled, fluid resuscitation were given and she was started on antibiotics  -Will continue abx's therapy for UTI -doubt meningitis; discontinued droplets precautions  -negative herpes PCR on LP -No abnormalities on telemetry -will discharge to SNF  2-presumed UTI: Patient with increased frequency and fever.  -will treat with ceftin BID for 4 more days to complete therapy -cx's w/o any growth, but patient's urine sent for culture after abx's on board   3-Accelerated hypertension: Well controlled at this point.  -Further adjustment to her antihypertensive regimen to be done as needed in outpatient setting -patient advise to follow heart healthy diet  4-hypothyroidism: TSH within normal limits. Continue Synthroid   5-diabetes mellitus type 2: Modified carbohydrate diet. Most recent A1c demonstrated a level 6.3 and no active treatment as an outpatient.   6-Constipation: Will continue MiraLAX and Colace;  prn sorbitol ordered as well   7-dementia: continue aricept.   8-GERD:  Continue PPI   9-hypokalemia: repleted.   10-physical deconditioning: PT recommending SNF. After  discussed options with family members, patient and SW will discharge to SNF.  11-Severe protein calorie malnutrition due to chronic illness:  -encourage PO intake -started on ensure puding daily -will continue glucerna BID  Procedures:  See x-ray reports  Consultations:  Neurology  PT/OT (recommended SNF)  Discharge Exam: Filed Vitals:   08/29/13 0556  BP: 121/40  Pulse: 55  Temp: 98.8 F (37.1 C)  Resp: 17   General: NAD, AAOX2, afebrile  Cardiovascular: S1 and S2, no rubs or gallops  Respiratory: CTA bilaterally  Abdomen: soft, NT, ND, positive BS Musculoskeletal: no edema or cyanosis   Discharge Instructions  Discharge Orders   Future Orders Complete By Expires   Discharge instructions  As directed    Comments:     Keep patient well hydrated Take medications as prescribed Follow up with PCP in 1 week Encourage PO intake for better nutrition Follow a heart healthy diet Patient is a risk for falling and no safety awareness due to dementia       Medication List    STOP taking these medications       aspirin 325 MG tablet  Replaced by:  aspirin 81 MG chewable tablet     bisoprolol-hydrochlorothiazide 5-6.25 MG per tablet  Commonly known as:  ZIAC      TAKE these medications       aspirin 81 MG chewable tablet  Chew 1 tablet (81 mg total) by mouth daily.     bisoprolol 5 MG tablet  Commonly known as:  ZEBETA  Take 1 tablet (5 mg total) by mouth daily.     cefUROXime 500 MG tablet  Commonly known as:  CEFTIN  Take 1 tablet (500 mg total) by mouth 2 (two) times daily with a meal. Take medications for 4 more days     clorazepate 3.75 MG tablet  Commonly known as:  TRANXENE  Take 1 tablet (3.75 mg total) by mouth 3 (three) times daily as needed for anxiety.     docusate 50 MG/5ML liquid  Commonly known as:  COLACE  Take 10 mLs (100 mg total) by  mouth 2 (two) times daily.     donepezil 10 MG tablet  Commonly known as:  ARICEPT  Take 10 mg by mouth daily.     feeding supplement (ENSURE) Pudg  Take 1 Container by mouth 2 (two) times daily after a meal.     feeding supplement (GLUCERNA SHAKE) Liqd  Take 237 mLs by mouth 2 (two) times daily between meals.     isosorbide-hydrALAZINE 20-37.5 MG per tablet  Commonly known as:  BIDIL  Take 1 tablet by mouth 3 (three) times daily.     levothyroxine 50 MCG tablet  Commonly known as:  SYNTHROID, LEVOTHROID  Take 50 mcg by mouth daily before breakfast.     lisinopril 40 MG tablet  Commonly known as:  PRINIVIL,ZESTRIL  Take 40 mg by mouth daily.     pantoprazole 40 MG tablet  Commonly known as:  PROTONIX  Take 40 mg by mouth daily.     polyethylene glycol packet  Commonly known as:  MIRALAX / GLYCOLAX  Take 17 g by mouth daily.     potassium chloride SA 20 MEQ tablet  Commonly known as:  K-DUR,KLOR-CON  Take 20 mEq by mouth 2 (two) times daily.  Travoprost (BAK Free) 0.004 % Soln ophthalmic solution  Commonly known as:  TRAVATAN  Place 1 drop into both eyes at bedtime.     Vitamin D 2000 UNITS tablet  Take 2,000 Units by mouth daily.       No Known Allergies     Follow-up Information   Follow up with MCKEOWN,WILLIAM DAVID, MD. Schedule an appointment as soon as possible for a visit in 1 week.   Specialty:  Internal Medicine   Contact information:   [address] [place] [address] [phone]        The results of significant diagnostics from this hospitalization (including imaging, microbiology, ancillary and laboratory) are listed below for reference.    Significant Diagnostic Studies: Dg Chest 1 View  08/25/2013   CLINICAL DATA:  Altered mental status  EXAM: CHEST - 1 VIEW  COMPARISON:  Prior chest x-ray 02/27/2013  FINDINGS: Cardiomegaly with enlargement of the main pulmonary artery and possibly left atrial appendage.  Atherosclerotic and tortuous thoracic aorta. The lungs are clear. Coarse central bronchitic changes and mild prominence of the interstitial markings appear similar compared to prior. No acute osseous abnormality.  IMPRESSION: Enlargement of the main pulmonary artery and/or left atrial appendage. A left hilar mass is a less likely possibility. Recommend dedicated PA and lateral chest x-ray when the patient is able.  Otherwise, no active disease. Stable bronchitic changes and interstitial prominence.   Electronically Signed   By: Malachy Moan M.D.   On: 08/25/2013 13:46   Ct Head Wo Contrast  08/25/2013   CLINICAL DATA:  Altered mental status.  EXAM: CT HEAD WITHOUT CONTRAST  TECHNIQUE: Contiguous axial images were obtained from the base of the skull through the vertex without intravenous contrast.  COMPARISON:  02/27/2013  FINDINGS: No intracranial hemorrhage.  Prominent small vessel disease type changes without CT evidence of large acute infarct.  Global atrophy without hydrocephalus.  No intracranial mass lesion noted on this unenhanced exam.  .  Vascular calcifications.  Hyperostosis frontalis interna.  Polypoid opacification right frontal sinus. Mild mucosal thickening ethmoid sinus air cells.  IMPRESSION: No intracranial hemorrhage.  Prominent small vessel disease type changes without CT evidence of large acute infarct.  Global atrophy without hydrocephalus.  Polypoid opacification right frontal sinus. Mild mucosal thickening ethmoid sinus air cells.   Electronically Signed   By: Bridgett Larsson M.D.   On: 08/25/2013 13:13   Mr Laqueta Jean ON Contrast  08/26/2013   CLINICAL DATA:  Increased confusion and agitation. Acute encephalopathy.  EXAM: MRI HEAD WITHOUT AND WITH CONTRAST  TECHNIQUE: Multiplanar, multiecho pulse sequences of the brain and surrounding structures were obtained according to standard protocol without and with intravenous contrast  CONTRAST:  10mL MULTIHANCE GADOBENATE DIMEGLUMINE 529  MG/ML IV SOLN  COMPARISON:  CT head without contrast 08/25/2013. MRI brain 04/27/2012.  FINDINGS: The midline structures are within normal limits. The diffusion-weighted images demonstrate no evidence for acute or subacute infarction. The ventricles are proportionate to the degree of atrophy without significant interval change. Diffuse white matter changes are similar to the prior exam. Mild brainstem white matter changes are asymmetric on the right.  Diffuse signal abnormality is present within the thalami bilaterally. Remote lacunar infarcts are present within the basal ganglia bilaterally.  The postcontrast images are markedly degraded by patient motion. No pathologic enhancement is evident. Hyperostosis frontalis internus is again noted. Mucosal thickening and a fluid level is present within the lateral right frontal sinus. The remaining paranasal sinuses and the  mastoid air cells are clear.  IMPRESSION: 1. Stable atrophy and diffuse white matter disease. This likely reflects the sequelae of chronic microvascular ischemia. 2. No acute intracranial abnormality or significant interval change. 3. New right frontal sinus disease.   Electronically Signed   By: Gennette Pac M.D.   On: 08/26/2013 17:50   Dg Foot Complete Right  08/25/2013   CLINICAL DATA:  Altered mental status  EXAM: RIGHT FOOT COMPLETE - 3+ VIEW  COMPARISON:  None.  FINDINGS: Three views of the right foot submitted. No acute fracture or subluxation. Hallux valgus deformity. Diffuse osteopenia. Mild degenerative erosive changes noted distal aspect 1st metatarsal. Plantar spurring of calcaneus.  IMPRESSION: No acute fracture or subluxation. Plantar spurring of calcaneus. Diffuse osteopenia. Hallux valgus deformity. Mild degenerative changes.   Electronically Signed   By: Natasha Mead M.D.   On: 08/25/2013 13:39    Microbiology: Recent Results (from the past 240 hour(s))  CULTURE, BLOOD (ROUTINE X 2)     Status: None   Collection Time     08/25/13 12:30 PM      Result Value Range Status   Specimen Description BLOOD RIGHT HAND   Final   Special Requests BOTTLES DRAWN AEROBIC ONLY 10CC   Final   Culture  Setup Time     Final   Value: 08/25/2013 16:28     Performed at [facility]   Culture     Final   Value:        BLOOD CULTURE RECEIVED NO GROWTH TO DATE CULTURE WILL BE HELD FOR 5 DAYS BEFORE ISSUING A FINAL NEGATIVE REPORT     Performed at [facility]   Report Status PENDING   Incomplete  CULTURE, BLOOD (ROUTINE X 2)     Status: None   Collection Time    08/25/13 12:45 PM      Result Value Range Status   Specimen Description BLOOD LEFT ANTECUBITAL   Final   Special Requests     Final   Value: BOTTLES DRAWN AEROBIC AND ANAEROBIC 10CC AERO, 7CC ANA   Culture  Setup Time     Final   Value: 08/25/2013 16:27     Performed at [facility]   Culture     Final   Value:        BLOOD CULTURE RECEIVED NO GROWTH TO DATE CULTURE WILL BE HELD FOR 5 DAYS BEFORE ISSUING A FINAL NEGATIVE REPORT     Performed at [facility]   Report Status PENDING   Incomplete  CSF CULTURE     Status: None   Collection Time    08/25/13  4:37 PM      Result Value Range Status   Specimen Description CSF   Final   Special Requests 1.0ML FLUID   Final   Gram Stain     Final   Value: WBC PRESENT, PREDOMINANTLY MONONUCLEAR     NO ORGANISMS SEEN     Performed at [facility] CYTOSPIN     Performed at [facility]   Culture     Final   Value: NO GROWTH 3 DAYS     Performed at [facility]   Report Status 08/29/2013 FINAL   Final  GRAM STAIN     Status: None   Collection Time    08/25/13  4:37 PM      Result Value Range Status   Specimen Description CSF   Final   Special Requests 1.  FLUID   Final   Gram Stain     Final   Value: CYTOSPIN PREP     WBC PRESENT, PREDOMINANTLY MONONUCLEAR     NO ORGANISMS SEEN   Report Status 08/25/2013 FINAL   Final  URINE CULTURE     Status: None    Collection Time    08/26/13  8:39 AM      Result Value Range Status   Specimen Description URINE, RANDOM   Final   Special Requests NONE   Final   Culture  Setup Time     Final   Value: 08/26/2013 13:22     Performed at [facility] Count     Final   Value: NO GROWTH     Performed at [facility]   Culture     Final   Value: NO GROWTH     Performed at [facility]   Report Status 08/27/2013 FINAL   Final     Labs: Basic Metabolic Panel:  Recent Labs Lab 08/25/13 1245 08/25/13 2112 08/26/13 0450 08/27/13 0500 08/28/13 0520  NA 136  --  138 136 139  K 4.0  --  3.1* 4.0 3.4*  CL 98  --  102 99 104  CO2 25  --  24 23 27   GLUCOSE 126*  --  122* 131* 132*  BUN 13  --  11 12 11   CREATININE 0.62 0.59 0.64 0.61 0.67  CALCIUM 10.0  --  9.1 9.6 9.2  MG  --   --   --  1.7  --    Liver Function Tests:  Recent Labs Lab 08/25/13 1245 08/26/13 0450  AST 23 17  ALT 11 8  ALKPHOS 78 55  BILITOT 0.8 0.9  PROT 7.9 6.3  ALBUMIN 3.7 2.9*   No results found for this basename: LIPASE, AMYLASE,  in the last 168 hours  Recent Labs Lab 08/25/13 2112  AMMONIA 33   CBC:  Recent Labs Lab 08/25/13 1245 08/25/13 2112 08/26/13 0450  WBC 7.9 7.8 6.6  NEUTROABS 6.2  --   --   HGB 14.7 13.0 11.9*  HCT 43.1 38.2 35.6*  MCV 87.8 88.8 88.8  PLT PLATELET CLUMPS NOTED ON SMEAR, COUNT APPEARS ADEQUATE 131* 138*   CBG:  Recent Labs Lab 08/28/13 1127 08/28/13 1553 08/28/13 2036 08/29/13 0636 08/29/13 1116  GLUCAP 139* 142* 142* 117* 111*    Signed:  Adewale Pucillo  [facility] 08/29/2013, 11:51 AM

## 2013-08-29 NOTE — Clinical Social Work Psychosocial (Signed)
     Clinical Social Work Department BRIEF PSYCHOSOCIAL ASSESSMENT 08/29/2013  Patient:  Megan [name], Megan [name]     Account Number:  [id number]     Admit date:  08/25/2013  Clinical Social Worker:  Tiburcio Pea  Date/Time:  08/29/2013 04:13 PM  Referred by:  Physician  Date Referred:  08/28/2013 Referred for  SNF Placement   Other Referral:   Interview type:  Family Other interview type:    PSYCHOSOCIAL DATA Living Status:  FAMILY Admitted from facility:   Level of care:   Primary support name:  Megan [name]  [id number] Primary support relationship to patient:  CHILD, ADULT Degree of support available:   Strong    CURRENT CONCERNS Current Concerns  Post-Acute Placement   Other Concerns:    SOCIAL WORK ASSESSMENT / PLAN 79 year old female admitted from home where she has been staying with family. Patient's husband Megan [name] is currently a resident of [place] and [address].  Patient is somewhat confused and PT recommends SNF placement. Son agrees and states that patient cannot be managed at home at this time. He wants patient to go to [place] where her husband is. Fl2 signed by MD.  Megan [name] is stable for d/c. Referral completed to SNF's in [place]; bed offer made and accepted to [facility]. Ok per MD for d/c to SNF today.   Assessment/plan status:  No Further Intervention Required Other assessment/ plan:   Information/referral to community resources:   None    PATIENTS/FAMILYS RESPONSE TO PLAN OF CARE: DC today to [facility]. Patient remains confused. Ok per son who is pleased.  Nursing notified. CSW signing off.  Megan [name]. Megan [name], LCSWA  [phone]

## 2013-08-30 ENCOUNTER — Encounter: Payer: Self-pay | Admitting: Internal Medicine

## 2013-08-30 ENCOUNTER — Non-Acute Institutional Stay (SKILLED_NURSING_FACILITY): Payer: Medicare Other | Admitting: Internal Medicine

## 2013-08-30 DIAGNOSIS — E118 Type 2 diabetes mellitus with unspecified complications: Secondary | ICD-10-CM

## 2013-08-30 DIAGNOSIS — F0150 Vascular dementia without behavioral disturbance: Secondary | ICD-10-CM

## 2013-08-30 DIAGNOSIS — E039 Hypothyroidism, unspecified: Secondary | ICD-10-CM

## 2013-08-30 DIAGNOSIS — I10 Essential (primary) hypertension: Secondary | ICD-10-CM

## 2013-08-30 DIAGNOSIS — N390 Urinary tract infection, site not specified: Secondary | ICD-10-CM

## 2013-08-30 DIAGNOSIS — G9340 Encephalopathy, unspecified: Secondary | ICD-10-CM

## 2013-08-30 DIAGNOSIS — K219 Gastro-esophageal reflux disease without esophagitis: Secondary | ICD-10-CM

## 2013-08-30 DIAGNOSIS — E43 Unspecified severe protein-calorie malnutrition: Secondary | ICD-10-CM

## 2013-08-30 NOTE — Assessment & Plan Note (Signed)
Continue protinix 40 mg daily

## 2013-08-30 NOTE — Progress Notes (Signed)
MRN: [id number] Name: Megan [name]  Sex: female Age: 79 y.o. DOB: 07-20-1927  PSC #: [name] Facility/Room: 120 Level Of Care: SNF Provider: Merrilee Seashore D Emergency Contacts: Extended Emergency Contact Information Primary Emergency Contact: Tia Masker Address: [address]          [place], [place] Home Phone: [phone] Relation: Spouse Secondary Emergency Contact: Soloman,Joseph  [place] Home Phone: [phone] Relation: Son  Code Status: FULL  Allergies: Review of patient's allergies indicates no known allergies.  Chief Complaint  Patient presents with  . nursing home admission    HPI: Patient is 79 y.o. female who is admitted for deconditioning and protein malnutrition for diet support and OT/PT.  Past Medical History  Diagnosis Date  . Hypertension   . Diastolic dysfunction, left ventricle by ECHO 2011 03/25/2012  . Hypothyroidism 03/25/2012  . High cholesterol   . Type II diabetes mellitus   . H/O hiatal hernia   . Duodenal perforation 03/23/12  . Arthritis     "in my back"  . GERD (gastroesophageal reflux disease)     Past Surgical History  Procedure Laterality Date  . Bladder tacked    . Repair perforated ulcer  03/23/12  . Diagnostic laparoscopy  03/25/12    REPAIR OF PERFORATED ULCER with omental patch  . Abcess drainage  03/25/12     of abdominal & pelvic abscesses  . Laparoscopy  03/24/2012    Procedure: LAPAROSCOPY DIAGNOSTIC;  Surgeon: Ardeth Sportsman, MD;  Location: [facility] OR;  Service: General;  Laterality: N/A;  drainage of abdominal and pelvic abcesses  . Laparotomy  06/03/2012    Procedure: EXPLORATORY LAPAROTOMY;  Surgeon: Mariella Saa, MD;  Location: [facility] OR;  Service: General;  Laterality: N/A;      Medication List       This list is accurate as of: 08/30/13 12:39 PM.  Always use your most recent med list.               aspirin 81 MG chewable tablet  Chew 1 tablet (81 mg  total) by mouth daily.     bisoprolol 5 MG tablet  Commonly known as:  ZEBETA  Take 1 tablet (5 mg total) by mouth daily.     cefUROXime 500 MG tablet  Commonly known as:  CEFTIN  Take 1 tablet (500 mg total) by mouth 2 (two) times daily with a meal. Take medications for 4 more days     clorazepate 3.75 MG tablet  Commonly known as:  TRANXENE  Take 1 tablet (3.75 mg total) by mouth 3 (three) times daily as needed for anxiety.     docusate 50 MG/5ML liquid  Commonly known as:  COLACE  Take 10 mLs (100 mg total) by mouth 2 (two) times daily.     donepezil 10 MG tablet  Commonly known as:  ARICEPT  Take 10 mg by mouth daily.     feeding supplement (ENSURE) Pudg  Take 1 Container by mouth 2 (two) times daily after a meal.     feeding supplement (GLUCERNA SHAKE) Liqd  Take 237 mLs by mouth 2 (two) times daily between meals.     isosorbide-hydrALAZINE 20-37.5 MG per tablet  Commonly known as:  BIDIL  Take 1 tablet by mouth 3 (three) times daily.     levothyroxine 50 MCG tablet  Commonly known as:  SYNTHROID, LEVOTHROID  Take 50 mcg by mouth daily before breakfast.     lisinopril  40 MG tablet  Commonly known as:  PRINIVIL,ZESTRIL  Take 40 mg by mouth daily.     pantoprazole 40 MG tablet  Commonly known as:  PROTONIX  Take 40 mg by mouth daily.     polyethylene glycol packet  Commonly known as:  MIRALAX / GLYCOLAX  Take 17 g by mouth daily.     potassium chloride SA 20 MEQ tablet  Commonly known as:  K-DUR,KLOR-CON  Take 20 mEq by mouth 2 (two) times daily.     Travoprost (BAK Free) 0.004 % Soln ophthalmic solution  Commonly known as:  TRAVATAN  Place 1 drop into both eyes at bedtime.     Vitamin D 2000 UNITS tablet  Take 2,000 Units by mouth daily.        No orders of the defined types were placed in this encounter.    Immunization History  Administered Date(s) Administered  . Pneumococcal Polysaccharide 04/30/2012    History  Substance Use Topics  .  Smoking status: Never Smoker   . Smokeless tobacco: Current User    Types: Snuff     Comment: 03/28/12 "use snuff q once in while; not regular"  . Alcohol Use: No    Family history is noncontributory    Review of Systems  DATA OBTAINED: from patient    PT HAS NO C/O GENERAL: Feels well no fevers, fatigue, appetite changes SKIN: No itching, rash or wounds EYES: No eye pain, redness, discharge EARS: No earache, tinnitus, change in hearing NOSE: No congestion, drainage or bleeding  MOUTH/THROAT: No mouth or tooth pain, No sore throat, No difficulty chewing or swallowing  RESPIRATORY: No cough, wheezing, SOB CARDIAC: No chest pain, palpitations, lower extremity edema  GI: No abdominal pain, No N/V/D or constipation, No heartburn or reflux  GU: No dysuria, frequency or urgency, or incontinence  MUSCULOSKELETAL: No unrelieved bone/joint pain NEUROLOGIC: No headache, dizziness or focal weakness PSYCHIATRIC: No overt anxiety or sadness. Sleeps well. No behavior issue.   Filed Vitals:   08/30/13 1238  BP: 128/64  Pulse: 76  Temp: 98.4 F (36.9 C)  Resp: 18    Physical Exam  GENERAL APPEARANCE: Alert, conversant. Appropriately groomed. No acute distress.  SKIN: No diaphoresis rash, or wounds HEAD: Normocephalic, atraumatic  EYES: Conjunctiva/lids clear. Pupils round, reactive. EOMs intact.  EARS: External exam WNL, canals clear. Hearing grossly normal.  NOSE: No deformity or discharge.  MOUTH/THROAT: Lips w/o lesions.  RESPIRATORY: Breathing is even, unlabored. Lung sounds are clear   CARDIOVASCULAR: Heart RRR no murmurs, rubs or gallops. No peripheral edema.  GASTROINTESTINAL: Abdomen is soft, non-tender, not distended w/ normal bowel sounds.  GENITOURINARY: Bladder non tender, not distended  MUSCULOSKELETAL: No abnormal joints or musculature NEUROLOGIC: Oriented X3. Cranial nerves 2-12 grossly intact. Moves all extremities no tremor. PSYCHIATRIC: Mood and affect appropriate  to situation, no behavioral issues  Patient Active Problem List   Diagnosis Date Noted  . Protein-calorie malnutrition, severe 08/30/2013  . GERD (gastroesophageal reflux disease)   . Acute encephalopathy 08/25/2013  . UTI (lower urinary tract infection) 08/25/2013  . Abdominal pain, bilateral lower quadrant 07/17/2013  . Bradycardia 07/17/2013  . Debility 03/30/2013  . Dehydration 02/28/2013  . Syncope 02/27/2013  . Hypokalemia 02/27/2013  . Arteriosclerotic dementia, uncomplicated 02/27/2013  . Rhabdomyolysis 02/27/2013  . Abnormal EKG 06/07/2012  . SBO (small bowel obstruction) 05/28/2012  . Nausea with vomiting 05/27/2012  . Acute delirium 04/27/2012  . Peritonitis 03/25/2012  . AKI (acute kidney injury) 03/25/2012  .  Delirium 03/25/2012  . DM (diabetes mellitus), type 2 with complications 03/25/2012  . Hypertension 03/25/2012  . Diastolic dysfunction, left ventricle by ECHO 2011 03/25/2012  . Arthritis 03/25/2012  . Hypothyroidism 03/25/2012  . Acute respiratory failure 03/25/2012    CBC    Component Value Date/Time   WBC 6.6 08/26/2013 0450   RBC 4.01 08/26/2013 0450   RBC 4.17 03/01/2013 0635   HGB 11.9* 08/26/2013 0450   HCT 35.6* 08/26/2013 0450   PLT 138* 08/26/2013 0450   MCV 88.8 08/26/2013 0450   LYMPHSABS 1.3 08/25/2013 1245   MONOABS 0.4 08/25/2013 1245   EOSABS 0.0 08/25/2013 1245   BASOSABS 0.0 08/25/2013 1245    CMP     Component Value Date/Time   NA 139 08/28/2013 0520   K 3.4* 08/28/2013 0520   CL 104 08/28/2013 0520   CO2 27 08/28/2013 0520   GLUCOSE 132* 08/28/2013 0520   BUN 11 08/28/2013 0520   CREATININE 0.67 08/28/2013 0520   CALCIUM 9.2 08/28/2013 0520   PROT 6.3 08/26/2013 0450   ALBUMIN 2.9* 08/26/2013 0450   AST 17 08/26/2013 0450   ALT 8 08/26/2013 0450   ALKPHOS 55 08/26/2013 0450   BILITOT 0.9 08/26/2013 0450   GFRNONAA 79* 08/28/2013 0520   GFRAA >90 08/28/2013 0520    Assessment and Plan  Acute  encephalopathy Felt to be secondary to HTN and presumed UTI; resolved now  UTI (lower urinary tract infection) Pt presented to ED with severe MS change and fever.Pt was txed in hospital with abx and D/C on ceftin with last dose being 11/1  Hypertension Was accelerated in ED, back to baseline now;meds were adjusted in hospital and may require further adjustment  Hypothyroidism Stable TSH 2.283-will continue current dose of 50 mcg  DM (diabetes mellitus), type 2 with complications On no meds;HbA1c was 6.3- modified carb diet only  Arteriosclerotic dementia, uncomplicated Continue aricept  GERD (gastroesophageal reflux disease) Continue protinix 40 mg daily  Protein-calorie malnutrition, severe Regular meals in SNF and 2 supplements are ordered    Margit Hanks, MD

## 2013-08-30 NOTE — Assessment & Plan Note (Signed)
On no meds;HbA1c was 6.3- modified carb diet only

## 2013-08-30 NOTE — Assessment & Plan Note (Signed)
Stable TSH 2.283-will continue current dose of 50 mcg

## 2013-08-30 NOTE — Assessment & Plan Note (Signed)
Felt to be secondary to HTN and presumed UTI; resolved now

## 2013-08-30 NOTE — Assessment & Plan Note (Signed)
Continue aricept 

## 2013-08-30 NOTE — Assessment & Plan Note (Signed)
Pt presented to ED with severe MS change and fever.Pt was txed in hospital with abx and D/C on ceftin with last dose being 11/1

## 2013-08-30 NOTE — Assessment & Plan Note (Signed)
Was accelerated in ED, back to baseline now;meds were adjusted in hospital and may require further adjustment

## 2013-08-30 NOTE — Assessment & Plan Note (Signed)
Regular meals in SNF and 2 supplements are ordered

## 2013-08-31 LAB — CULTURE, BLOOD (ROUTINE X 2)

## 2013-09-01 ENCOUNTER — Other Ambulatory Visit: Payer: Self-pay

## 2013-09-01 MED ORDER — LORAZEPAM 0.5 MG PO TABS: ORAL_TABLET | ORAL | Status: DC

## 2013-09-25 ENCOUNTER — Other Ambulatory Visit: Payer: Self-pay | Admitting: Internal Medicine

## 2013-09-25 MED ORDER — LISINOPRIL 40 MG PO TABS: 40.0000 mg | ORAL_TABLET | Freq: Every day | ORAL | Status: DC

## 2013-09-26 ENCOUNTER — Encounter: Payer: Self-pay | Admitting: Nurse Practitioner

## 2013-09-26 ENCOUNTER — Non-Acute Institutional Stay (SKILLED_NURSING_FACILITY): Payer: Medicare Other | Admitting: Nurse Practitioner

## 2013-09-26 DIAGNOSIS — R410 Disorientation, unspecified: Secondary | ICD-10-CM

## 2013-09-26 DIAGNOSIS — F0150 Vascular dementia without behavioral disturbance: Secondary | ICD-10-CM

## 2013-09-26 DIAGNOSIS — E118 Type 2 diabetes mellitus with unspecified complications: Secondary | ICD-10-CM

## 2013-09-26 DIAGNOSIS — I10 Essential (primary) hypertension: Secondary | ICD-10-CM

## 2013-09-26 DIAGNOSIS — N390 Urinary tract infection, site not specified: Secondary | ICD-10-CM

## 2013-09-26 DIAGNOSIS — R404 Transient alteration of awareness: Secondary | ICD-10-CM

## 2013-09-26 DIAGNOSIS — E039 Hypothyroidism, unspecified: Secondary | ICD-10-CM

## 2013-09-26 DIAGNOSIS — E43 Unspecified severe protein-calorie malnutrition: Secondary | ICD-10-CM

## 2013-09-26 DIAGNOSIS — K219 Gastro-esophageal reflux disease without esophagitis: Secondary | ICD-10-CM

## 2013-09-26 DIAGNOSIS — R5381 Other malaise: Secondary | ICD-10-CM

## 2013-09-26 NOTE — Progress Notes (Signed)
Patient ID: Megan [name], female   DOB: [birth year], 79 y.o.   MRN: [id number]    Nursing Home Location:  [facility]   Place of Service: SNF (31)  PCP: [name], MD  No Known Allergies  Chief Complaint  Patient presents with  . Discharge Note    HPI:  79 y.o. female with a past medical history of cognitive impairment was brought to the hospital with significant mental status changes. Initial workup in the emergency department was unremarkable, with chest x-ray not show an obvious infiltrate, urinalysis showing small amount of leukocytes with rare bacteria. Serum lactate was within normal limits. Patient was diagnosed with acute toxic/metabolic encephalopathy which was most likely secondary to presumed UTI and potentially hypertensive encephalopathy. Ammonia level and TSH within normal limits; CT and MRI of the head without acute abnormalities;Urine culture w/o growth; negative herpes PCR on LP. Patient mentation has improved and at baseline after her blood pressure was controlled, fluid resuscitation were given and she was started on antibiotics and transferred to [facility] for ongoing rehab; son would like to take pt home with 24 hour care. Patient currently doing well with therapy, now stable to discharge home with home health and 24 hour care.  Review of Systems:  Review of Systems  Constitutional: Negative for fever, chills and malaise/fatigue.  HENT: Negative for congestion and sore throat.   Respiratory: Negative for cough and shortness of breath.   Cardiovascular: Negative for chest pain.  Gastrointestinal: Negative for heartburn, abdominal pain, diarrhea and constipation.  Genitourinary: Negative for dysuria, urgency and frequency.  Musculoskeletal: Negative for joint pain and myalgias.  Skin: Negative for itching and rash.  Neurological: Positive for weakness. Negative for dizziness and headaches.  Psychiatric/Behavioral: Positive for memory loss.  The patient is not nervous/anxious.      Past Medical History  Diagnosis Date  . Hypertension   . Diastolic dysfunction, left ventricle by ECHO 2011 03/25/2012  . Hypothyroidism 03/25/2012  . High cholesterol   . Type II diabetes mellitus   . H/O hiatal hernia   . Duodenal perforation 03/23/12  . Arthritis     "in my back"  . GERD (gastroesophageal reflux disease)    Past Surgical History  Procedure Laterality Date  . Bladder tacked    . Repair perforated ulcer  03/23/12  . Diagnostic laparoscopy  03/25/12    REPAIR OF PERFORATED ULCER with omental patch  . Abcess drainage  03/25/12     of abdominal & pelvic abscesses  . Laparoscopy  03/24/2012    Procedure: LAPAROSCOPY DIAGNOSTIC;  Surgeon: Ardeth Sportsman, MD;  Location: [facility] OR;  Service: General;  Laterality: N/A;  drainage of abdominal and pelvic abcesses  . Laparotomy  06/03/2012    Procedure: EXPLORATORY LAPAROTOMY;  Surgeon: Mariella Saa, MD;  Location: [facility] OR;  Service: General;  Laterality: N/A;   Social History:   reports that she has never smoked. Her smokeless tobacco use includes Snuff. She reports that she does not drink alcohol or use illicit drugs.  Family History  Problem Relation Age of Onset  . Hypertension Mother   . Hypertension Father     Medications: Patient's Medications  New Prescriptions   No medications on file  Previous Medications   ASPIRIN 81 MG CHEWABLE TABLET    Chew 1 tablet (81 mg total) by mouth daily.   BISOPROLOL (ZEBETA) 5 MG TABLET    Take 1 tablet (5 mg total) by mouth daily.   CHOLECALCIFEROL (  VITAMIN D) 2000 UNITS TABLET    Take 2,000 Units by mouth daily.   CLORAZEPATE (TRANXENE) 3.75 MG TABLET    Take 1 tablet (3.75 mg total) by mouth 3 (three) times daily as needed for anxiety.   DOCUSATE (COLACE) 50 MG/5ML LIQUID    Take 10 mLs (100 mg total) by mouth 2 (two) times daily.   DONEPEZIL (ARICEPT) 10 MG TABLET    Take 10 mg by mouth daily.    FEEDING SUPPLEMENT, ENSURE, (ENSURE)  PUDG    Take 1 Container by mouth 2 (two) times daily after a meal.   FEEDING SUPPLEMENT, GLUCERNA SHAKE, (GLUCERNA SHAKE) LIQD    Take 237 mLs by mouth 2 (two) times daily between meals.   ISOSORBIDE-HYDRALAZINE (BIDIL) 20-37.5 MG PER TABLET    Take 1 tablet by mouth 3 (three) times daily.   LEVOTHYROXINE (SYNTHROID, LEVOTHROID) 50 MCG TABLET    Take 50 mcg by mouth daily before breakfast.   LISINOPRIL (PRINIVIL,ZESTRIL) 40 MG TABLET    Take 1 tablet (40 mg total) by mouth daily.   LORAZEPAM (ATIVAN) 0.5 MG TABLET    1 by mouth three times daily as needed for agitation   PANTOPRAZOLE (PROTONIX) 40 MG TABLET    Take 40 mg by mouth daily.   POLYETHYLENE GLYCOL (MIRALAX / GLYCOLAX) PACKET    Take 17 g by mouth daily.   POTASSIUM CHLORIDE SA (K-DUR,KLOR-CON) 20 MEQ TABLET    Take 20 mEq by mouth 2 (two) times daily.   TRAVOPROST, BAK FREE, (TRAVATAN) 0.004 % SOLN OPHTHALMIC SOLUTION    Place 1 drop into both eyes at bedtime.  Modified Medications   No medications on file  Discontinued Medications   No medications on file     Physical Exam: GENERAL APPEARANCE: Alert, conversant. Appropriately groomed. No acute distress.   SKIN: No diaphoresis rash, or wounds HEENT: unremarkable RESPIRATORY: Breathing is even, unlabored. Lung sounds are clear    CARDIOVASCULAR: Heart RRR no murmurs, rubs or gallops. No peripheral edema.   GASTROINTESTINAL: Abdomen is soft, non-tender, not distended w/ normal bowel sounds.   GENITOURINARY: Bladder non tender, not distended   MUSCULOSKELETAL: No abnormal joints or musculature NEUROLOGIC: Oriented X2. Cranial nerves 2-12 grossly intact. Moves all extremities no tremor. PSYCHIATRIC: Mood and affect appropriate to situation, no behavioral issues     Filed Vitals:   09/26/13 1348  BP: 155/62  Pulse: 84  Temp: 97 F (36.1 C)  Resp: 18      Labs reviewed: Basic Metabolic Panel:  Recent Labs  40/98/11 0515  03/01/13 0817  08/26/13 0450  08/27/13 0500 08/28/13 0520  NA 138  < >  --   < > 138 136 139  K 2.7*  < >  --   < > 3.1* 4.0 3.4*  CL 101  < >  --   < > 102 99 104  CO2 29  < >  --   < > 24 23 27   GLUCOSE 120*  < >  --   < > 122* 131* 132*  BUN 12  < >  --   < > 11 12 11   CREATININE 0.62  < >  --   < > 0.64 0.61 0.67  CALCIUM 9.2  < >  --   < > 9.1 9.6 9.2  MG 1.9  --  1.9  --   --  1.7  --   < > = values in this interval not displayed. Liver Function Tests:  Recent Labs  07/17/13 0511 08/25/13 1245 08/26/13 0450  AST 18 23 17   ALT 8 11 8   ALKPHOS 86 78 55  BILITOT 0.6 0.8 0.9  PROT 7.1 7.9 6.3  ALBUMIN 3.3* 3.7 2.9*    Recent Labs  07/17/13 0511  LIPASE 20    Recent Labs  08/25/13 2112  AMMONIA 33   CBC:  Recent Labs  02/27/13 1244  07/17/13 0511  08/25/13 1245 08/25/13 2112 08/26/13 0450  WBC 10.6*  < > 6.6  < > 7.9 7.8 6.6  NEUTROABS 8.9*  --  2.9  --  6.2  --   --   HGB 16.4*  < > 13.8  < > 14.7 13.0 11.9*  HCT 47.3*  < > 41.3  < > 43.1 38.2 35.6*  MCV 85.7  < > 89.0  < > 87.8 88.8 88.8  PLT 136*  < > 122*  < > PLATELET CLUMPS NOTED ON SMEAR, COUNT APPEARS ADEQUATE 131* 138*  < > = values in this interval not displayed. Cardiac Enzymes:  Recent Labs  02/27/13 1243 02/28/13 1539 03/02/13 0546  CKTOTAL 795* 792* 306*   BNP: No components found with this basename: POCBNP,  CBG:  Recent Labs  08/28/13 2036 08/29/13 0636 08/29/13 1116  GLUCAP 142* 117* 111*   TSH:  Recent Labs  02/28/13 1539 07/17/13 1035 08/25/13 2112  TSH 5.210* 4.216 2.283   A1C: Lab Results  Component Value Date   HGBA1C 6.3* 07/17/2013   Lipid Panel: No results found for this basename: CHOL, HDL, LDLCALC, TRIG, CHOLHDL, LDLDIRECT,  in the last 8760 hours  BMP with Estimated GFR       Result: 09/04/2013 3:37 PM    ( Status: F )            Sodium  141        135-145  mEq/L  SLN       Potassium  4.1        3.5-5.3  mEq/L  SLN       Chloride  106        96-112  mEq/L  SLN       CO2  23         19-32  mEq/L  SLN       Glucose  112     H  70-99  mg/dL  SLN       BUN  12        6-23  mg/dL  SLN       Creatinine  0.71        0.50-1.10  mg/dL  SLN       Calcium  8.8        8.4-10.5  mg/dL  SLN       Est GFR, African American  >89         mL/min  SLN       Est GFR, NonAfrican American  78         mL/min  SLN  C    TSH, Ultrasensitive       Result: 09/04/2013 4:22 PM    ( Status: F )            TSH  4.695     H  0.350-4.500  uIU/mL  SLN      Hemoglobin A1C       Result: 09/04/2013 4:00 PM    ( Status: F )  Hemoglobin A1C  6.2     H  <5.7  %  SLN  C     Estimated Average Glucose  131     H  <117  mg/dL  SLN      Assessment/Plan 1. Protein-calorie malnutrition, severe -will need to cont ensure on discharge -weight gain while here; PCP to follow weights  2. GERD (gastroesophageal reflux disease) --stable on protonix  3. DM (diabetes mellitus), type 2 with complications -diet controlled  4. UTI (lower urinary tract infection) -resolved  5. Uncomplicated arteriosclerotic dementia without behavioral disturbance -conts on aricept  6. Delirium -thought to be due to UTI and hypertension has now resolved and pt back to baseline  7. Hypothyroidism -stable on current synthroid  8. Hypertension Patients blood pressure is stable; continue current regimen.  9. Debility -doing well with rehab; pt is stable for discharge-will need PT/OT/Nursing/HHA/SW per home health. No DME needed. Rx written.  will need to follow up with PCP within 2 weeks.

## 2013-10-05 ENCOUNTER — Telehealth: Payer: Self-pay | Admitting: Internal Medicine

## 2013-10-05 ENCOUNTER — Ambulatory Visit (INDEPENDENT_AMBULATORY_CARE_PROVIDER_SITE_OTHER): Payer: Medicare Other | Admitting: Emergency Medicine

## 2013-10-05 DIAGNOSIS — E43 Unspecified severe protein-calorie malnutrition: Secondary | ICD-10-CM

## 2013-10-05 DIAGNOSIS — N390 Urinary tract infection, site not specified: Secondary | ICD-10-CM

## 2013-10-05 DIAGNOSIS — I10 Essential (primary) hypertension: Secondary | ICD-10-CM

## 2013-10-05 DIAGNOSIS — R5381 Other malaise: Secondary | ICD-10-CM

## 2013-10-05 DIAGNOSIS — R279 Unspecified lack of coordination: Secondary | ICD-10-CM

## 2013-10-05 DIAGNOSIS — Z79899 Other long term (current) drug therapy: Secondary | ICD-10-CM

## 2013-10-05 NOTE — Telephone Encounter (Signed)
noted 

## 2013-10-05 NOTE — Telephone Encounter (Signed)
FYI Pt heart rate was 52 PB was 160/68

## 2013-10-07 NOTE — Progress Notes (Signed)
Subjective:    Patient ID: Megan [name], female    DOB: 1927/07/25, 79 y.o.   MRN: [id number]  HPI Comments: 79 yo female unable to come in to office for face to face visit has sent her son in with concerns over medicine changes with recent hospitalization and rehab stays. She was admitted with UTI, Delirium, hypothyroidism and weakness. Son notes she is overall improving but still weak and occasional confusion. Son notes home health will have biweekly visits and possibly PT 3 x week.   Prescription changes as follows Clorazepate 7.5 decreased to 3.75, patient had been taking whole TID and Ziac was change to Zebeta 5 mg only. Ativan 0.5 mg TID/ PRN, Glucerna shake QD, and Miralax was added. Son brought in list of how medicines are being prepared at home and wants to clarify versus hospital/ rehab changes.    Current Outpatient Prescriptions on File Prior to Visit  Medication Sig Dispense Refill  . aspirin 81 MG chewable tablet Chew 1 tablet (81 mg total) by mouth daily.      . bisoprolol (ZEBETA) 5 MG tablet Take 1 tablet (5 mg total) by mouth daily.      . Cholecalciferol (VITAMIN D) 2000 UNITS tablet Take 2,000 Units by mouth daily.      . clorazepate (TRANXENE) 3.75 MG tablet Take 1 tablet (3.75 mg total) by mouth 3 (three) times daily as needed for anxiety.  30 tablet  0  . docusate (COLACE) 50 MG/5ML liquid Take 10 mLs (100 mg total) by mouth 2 (two) times daily.  100 mL  0  . donepezil (ARICEPT) 10 MG tablet Take 10 mg by mouth daily.       . feeding supplement, ENSURE, (ENSURE) PUDG Take 1 Container by mouth 2 (two) times daily after a meal.    0  . feeding supplement, GLUCERNA SHAKE, (GLUCERNA SHAKE) LIQD Take 237 mLs by mouth 2 (two) times daily between meals.    0  . isosorbide-hydrALAZINE (BIDIL) 20-37.5 MG per tablet Take 1 tablet by mouth 3 (three) times daily.      Marland Kitchen levothyroxine (SYNTHROID, LEVOTHROID) 50 MCG tablet Take 50 mcg by mouth daily before breakfast.      .  lisinopril (PRINIVIL,ZESTRIL) 40 MG tablet Take 1 tablet (40 mg total) by mouth daily.  30 tablet  3  . LORazepam (ATIVAN) 0.5 MG tablet 1 by mouth three times daily as needed for agitation  90 tablet  5  . pantoprazole (PROTONIX) 40 MG tablet Take 40 mg by mouth daily.      . polyethylene glycol (MIRALAX / GLYCOLAX) packet Take 17 g by mouth daily.  14 each  0  . potassium chloride SA (K-DUR,KLOR-CON) 20 MEQ tablet Take 20 mEq by mouth 2 (two) times daily.      . Travoprost, BAK Free, (TRAVATAN) 0.004 % SOLN ophthalmic solution Place 1 drop into both eyes at bedtime.       No current facility-administered medications on file prior to visit.   Review of patient's allergies indicates no known allergies.  Past Medical History  Diagnosis Date  . Hypertension   . Diastolic dysfunction, left ventricle by ECHO 2011 03/25/2012  . Hypothyroidism 03/25/2012  . High cholesterol   . Type II diabetes mellitus   . H/O hiatal hernia   . Duodenal perforation 03/23/12  . Arthritis     "in my back"  . GERD (gastroesophageal reflux disease)     Review of Systems  Constitutional: Positive  for fatigue.  Neurological: Positive for weakness.  Psychiatric/Behavioral: Positive for confusion.    There were no vitals taken for this visit.     Objective:   Physical Exam    No physical EXAM performed patient unable to come in for office visit. Message with patients concerns and symptoms sent by her son.     Assessment & Plan:  Reviewed hospital/ rehab records/ home medication list. Annotated all medications with use for each medicine and corrected home list. Advised needs OV to recheck lbs in 1 week. Call if any increase symptoms with fatigue or confusion.

## 2013-10-09 ENCOUNTER — Encounter: Payer: Self-pay | Admitting: Emergency Medicine

## 2013-10-13 ENCOUNTER — Telehealth: Payer: Self-pay | Admitting: *Deleted

## 2013-10-13 NOTE — Telephone Encounter (Signed)
Rhunette Croft, O/T, called. OK for O/T 1 time per week for 2 weeks per Dr Oneta Rack ([facility])

## 2013-10-16 ENCOUNTER — Ambulatory Visit (INDEPENDENT_AMBULATORY_CARE_PROVIDER_SITE_OTHER): Payer: Medicare Other | Admitting: Emergency Medicine

## 2013-10-16 ENCOUNTER — Ambulatory Visit: Payer: Self-pay | Admitting: Emergency Medicine

## 2013-10-16 ENCOUNTER — Encounter: Payer: Self-pay | Admitting: Emergency Medicine

## 2013-10-16 VITALS — BP 124/62 | HR 74 | Temp 98.2°F | Resp 16 | Wt 116.0 lb

## 2013-10-16 DIAGNOSIS — R5381 Other malaise: Secondary | ICD-10-CM

## 2013-10-16 DIAGNOSIS — E039 Hypothyroidism, unspecified: Secondary | ICD-10-CM

## 2013-10-16 LAB — CBC WITH DIFFERENTIAL/PLATELET
Basophils Absolute: 0 10*3/uL (ref 0.0–0.1)
Eosinophils Absolute: 0.3 10*3/uL (ref 0.0–0.7)
HCT: 40 % (ref 36.0–46.0)
Hemoglobin: 13.6 g/dL (ref 12.0–15.0)
Lymphs Abs: 2.1 10*3/uL (ref 0.7–4.0)
MCH: 30.2 pg (ref 26.0–34.0)
MCV: 88.9 fL (ref 78.0–100.0)
Monocytes Absolute: 0.4 10*3/uL (ref 0.1–1.0)
Monocytes Relative: 8 % (ref 3–12)
Neutrophils Relative %: 48 % (ref 43–77)
RBC: 4.5 MIL/uL (ref 3.87–5.11)
RDW: 14.2 % (ref 11.5–15.5)

## 2013-10-16 NOTE — Patient Instructions (Signed)

## 2013-10-17 LAB — BASIC METABOLIC PANEL WITH GFR
CO2: 30 mEq/L (ref 19–32)
Chloride: 102 mEq/L (ref 96–112)
GFR, Est African American: 89 mL/min
Glucose, Bld: 87 mg/dL (ref 70–99)
Potassium: 4.2 mEq/L (ref 3.5–5.3)
Sodium: 141 mEq/L (ref 135–145)

## 2013-10-17 LAB — HEPATIC FUNCTION PANEL
ALT: <8 U/L (ref 0–35)
AST: 17 U/L (ref 0–37)
Bilirubin, Direct: 0.2 mg/dL (ref 0.0–0.3)
Total Bilirubin: 0.9 mg/dL (ref 0.3–1.2)

## 2013-10-17 LAB — TSH: TSH: 2.619 u[IU]/mL (ref 0.350–4.500)

## 2013-10-17 NOTE — Progress Notes (Signed)
Subjective:    Patient ID: Megan [name], female    DOB: 1927-07-31, 79 y.o.   MRN: [id number]  HPI Comments: 79 yo AAF recently d/c from rehab/ hospital. She has been receiving PT 2 x week and is slowly regaining strength. She has decreased appetite and has lost over 6 # since last OV. She is uncertain of her medicines and daughter is not sure either. Normal care giver unable to attend OV today but has had recent med verification OV.   Hypertension  Diabetes Associated symptoms include fatigue.  Gastrophageal Reflux Associated symptoms include fatigue.     Current Outpatient Prescriptions on File Prior to Visit  Medication Sig Dispense Refill  . aspirin 81 MG chewable tablet Chew 1 tablet (81 mg total) by mouth daily.      . bisoprolol (ZEBETA) 5 MG tablet Take 1 tablet (5 mg total) by mouth daily.      . Cholecalciferol (VITAMIN D) 2000 UNITS tablet Take 2,000 Units by mouth daily.      . clorazepate (TRANXENE) 3.75 MG tablet Take 1 tablet (3.75 mg total) by mouth 3 (three) times daily as needed for anxiety.  30 tablet  0  . docusate (COLACE) 50 MG/5ML liquid Take 10 mLs (100 mg total) by mouth 2 (two) times daily.  100 mL  0  . donepezil (ARICEPT) 10 MG tablet Take 10 mg by mouth daily.       . feeding supplement, ENSURE, (ENSURE) PUDG Take 1 Container by mouth 2 (two) times daily after a meal.    0  . feeding supplement, GLUCERNA SHAKE, (GLUCERNA SHAKE) LIQD Take 237 mLs by mouth 2 (two) times daily between meals.    0  . isosorbide-hydrALAZINE (BIDIL) 20-37.5 MG per tablet Take 1 tablet by mouth 3 (three) times daily.      Marland Kitchen levothyroxine (SYNTHROID, LEVOTHROID) 50 MCG tablet Take 50 mcg by mouth daily before breakfast.      . lisinopril (PRINIVIL,ZESTRIL) 40 MG tablet Take 1 tablet (40 mg total) by mouth daily.  30 tablet  3  . LORazepam (ATIVAN) 0.5 MG tablet 1 by mouth three times daily as needed for agitation  90 tablet  5  . pantoprazole (PROTONIX) 40 MG tablet Take 40 mg  by mouth daily.      . polyethylene glycol (MIRALAX / GLYCOLAX) packet Take 17 g by mouth daily.  14 each  0  . potassium chloride SA (K-DUR,KLOR-CON) 20 MEQ tablet Take 20 mEq by mouth 2 (two) times daily.      . Travoprost, BAK Free, (TRAVATAN) 0.004 % SOLN ophthalmic solution Place 1 drop into both eyes at bedtime.       No current facility-administered medications on file prior to visit.    Review of patient's allergies indicates no known allergies.  Past Medical History  Diagnosis Date  . Hypertension   . Diastolic dysfunction, left ventricle by ECHO 2011 03/25/2012  . Hypothyroidism 03/25/2012  . High cholesterol   . Type II diabetes mellitus   . H/O hiatal hernia   . Duodenal perforation 03/23/12  . Arthritis     "in my back"  . GERD (gastroesophageal reflux disease)     Review of Systems  Constitutional: Positive for appetite change, fatigue and unexpected weight change.  All other systems reviewed and are negative.    BP 124/62  Pulse 74  Temp(Src) 98.2 F (36.8 C) (Temporal)  Resp 16  Wt 116 lb (52.617 kg)  Objective:   Physical Exam  Nursing note and vitals reviewed. Constitutional: She appears well-developed. No distress.  Thin  HENT:  Head: Normocephalic and atraumatic.  Right Ear: External ear normal.  Left Ear: External ear normal.  Nose: Nose normal.  Mouth/Throat: Oropharynx is clear and moist.  Eyes: Conjunctivae and EOM are normal.  Neck: Normal range of motion. Neck supple. No JVD present. No thyromegaly present.  Cardiovascular: Normal rate, regular rhythm, normal heart sounds and intact distal pulses.   Pulmonary/Chest: Effort normal and breath sounds normal.  Abdominal: Soft. Bowel sounds are normal. She exhibits no distension and no mass. There is no tenderness. There is no rebound and no guarding.  Musculoskeletal: Normal range of motion. She exhibits no edema and no tenderness.  Lymphadenopathy:    She has no cervical adenopathy.   Neurological: She is alert. No cranial nerve deficit.  Oriented except to date. 1939 but recalls President and answers appropriate questions  Skin: Skin is warm and dry. No rash noted. No erythema. No pallor.  Psychiatric: She has a normal mood and affect. Her behavior is normal. Judgment and thought content normal.          Assessment & Plan:  1. Fatigue and wt loss concerns with recent hospital/ rehab stay. Verify meds with son, check labs, SX Boost given, increase protein, chair exercises

## 2013-10-20 ENCOUNTER — Other Ambulatory Visit: Payer: Self-pay | Admitting: Nurse Practitioner

## 2013-10-24 ENCOUNTER — Telehealth: Payer: Self-pay | Admitting: Internal Medicine

## 2013-10-24 NOTE — Telephone Encounter (Signed)
Patient's son, Alinda Money, called to inform us that patient fell at her house Sunday, 10/22/13.  Minor scrape above right eye, not bleeding.  Alinda Money denies any concern for office visit for patient, but advised him to have [facility] to check patient's BS and BP.  Lucious Groves to schedule office visit if readings are too low or if symptoms from her recent fall present.  Alinda Money states [facility] nurse is scheduled to evaluate patient today and will report BS and BP readings.

## 2013-11-14 ENCOUNTER — Ambulatory Visit (INDEPENDENT_AMBULATORY_CARE_PROVIDER_SITE_OTHER): Payer: Medicare Other | Admitting: Emergency Medicine

## 2013-11-14 ENCOUNTER — Encounter: Payer: Self-pay | Admitting: Emergency Medicine

## 2013-11-14 VITALS — BP 162/64 | HR 58 | Temp 98.2°F | Resp 16 | Ht 63.0 in | Wt 125.0 lb

## 2013-11-14 DIAGNOSIS — I10 Essential (primary) hypertension: Secondary | ICD-10-CM

## 2013-11-14 DIAGNOSIS — R5381 Other malaise: Secondary | ICD-10-CM

## 2013-11-14 DIAGNOSIS — R5383 Other fatigue: Secondary | ICD-10-CM

## 2013-11-14 LAB — CBC WITH DIFFERENTIAL/PLATELET
Basophils Absolute: 0 10*3/uL (ref 0.0–0.1)
Basophils Relative: 1 % (ref 0–1)
EOS ABS: 0.2 10*3/uL (ref 0.0–0.7)
Eosinophils Relative: 3 % (ref 0–5)
HCT: 36 % (ref 36.0–46.0)
HEMOGLOBIN: 12 g/dL (ref 12.0–15.0)
LYMPHS PCT: 39 % (ref 12–46)
Lymphs Abs: 2.1 10*3/uL (ref 0.7–4.0)
MCH: 29.4 pg (ref 26.0–34.0)
MCHC: 33.3 g/dL (ref 30.0–36.0)
MCV: 88.2 fL (ref 78.0–100.0)
MONOS PCT: 6 % (ref 3–12)
Monocytes Absolute: 0.3 10*3/uL (ref 0.1–1.0)
NEUTROS PCT: 51 % (ref 43–77)
Neutro Abs: 2.8 10*3/uL (ref 1.7–7.7)
PLATELETS: 143 10*3/uL — AB (ref 150–400)
RBC: 4.08 MIL/uL (ref 3.87–5.11)
RDW: 13.9 % (ref 11.5–15.5)
WBC: 5.4 10*3/uL (ref 4.0–10.5)

## 2013-11-14 LAB — BASIC METABOLIC PANEL WITH GFR
BUN: 16 mg/dL (ref 6–23)
CALCIUM: 9.7 mg/dL (ref 8.4–10.5)
CO2: 29 mEq/L (ref 19–32)
Chloride: 107 mEq/L (ref 96–112)
Creat: 0.81 mg/dL (ref 0.50–1.10)
GFR, EST AFRICAN AMERICAN: 77 mL/min
GFR, EST NON AFRICAN AMERICAN: 67 mL/min
GLUCOSE: 98 mg/dL (ref 70–99)
POTASSIUM: 4.2 meq/L (ref 3.5–5.3)
SODIUM: 142 meq/L (ref 135–145)

## 2013-11-14 LAB — HEPATIC FUNCTION PANEL
ALT: <8 U/L (ref 0–35)
AST: 13 U/L (ref 0–37)
Albumin: 3.4 g/dL — ABNORMAL LOW (ref 3.5–5.2)
Alkaline Phosphatase: 53 U/L (ref 39–117)
BILIRUBIN DIRECT: 0.1 mg/dL (ref 0.0–0.3)
Indirect Bilirubin: 0.5 mg/dL (ref 0.0–0.9)
TOTAL PROTEIN: 6.1 g/dL (ref 6.0–8.3)
Total Bilirubin: 0.6 mg/dL (ref 0.3–1.2)

## 2013-11-14 NOTE — Progress Notes (Signed)
Subjective:    Patient ID: Megan [name], female    DOB: 12/20/1926, 80 y.o.   MRN: [id number]  HPI Comments: 80 yo female for f/u HTN. BP at last OV 124/62, she is not checking BP at home. She was supposed to have recheck with all of her medications present at today's visit to get regulated, she forgot RX and list at home. She notes she feels good over all. She enjoys sleep and has been sleeping more with colder weather. She is up 9 # since last OV.   Current Outpatient Prescriptions on File Prior to Visit  Medication Sig Dispense Refill  . aspirin 81 MG chewable tablet Chew 1 tablet (81 mg total) by mouth daily.      . bisoprolol (ZEBETA) 5 MG tablet Take 1 tablet (5 mg total) by mouth daily.      . Cholecalciferol (VITAMIN D) 2000 UNITS tablet Take 2,000 Units by mouth daily.      . clorazepate (TRANXENE) 3.75 MG tablet Take 1 tablet (3.75 mg total) by mouth 3 (three) times daily as needed for anxiety.  30 tablet  0  . docusate (COLACE) 50 MG/5ML liquid Take 10 mLs (100 mg total) by mouth 2 (two) times daily.  100 mL  0  . donepezil (ARICEPT) 10 MG tablet Take 10 mg by mouth daily.       . feeding supplement, GLUCERNA SHAKE, (GLUCERNA SHAKE) LIQD Take 237 mLs by mouth 2 (two) times daily between meals.    0  . isosorbide-hydrALAZINE (BIDIL) 20-37.5 MG per tablet Take 1 tablet by mouth 3 (three) times daily.      Marland Kitchen levothyroxine (SYNTHROID, LEVOTHROID) 50 MCG tablet Take 50 mcg by mouth daily before breakfast.      . lisinopril (PRINIVIL,ZESTRIL) 40 MG tablet Take 1 tablet (40 mg total) by mouth daily.  30 tablet  3  . LORazepam (ATIVAN) 0.5 MG tablet 1 by mouth three times daily as needed for agitation  90 tablet  5  . pantoprazole (PROTONIX) 40 MG tablet Take 40 mg by mouth daily.      . potassium chloride SA (K-DUR,KLOR-CON) 20 MEQ tablet Take 20 mEq by mouth 2 (two) times daily.      . Travoprost, BAK Free, (TRAVATAN) 0.004 % SOLN ophthalmic solution Place 1 drop into both eyes at  bedtime.      . feeding supplement, ENSURE, (ENSURE) PUDG Take 1 Container by mouth 2 (two) times daily after a meal.    0  . polyethylene glycol (MIRALAX / GLYCOLAX) packet Take 17 g by mouth daily.  14 each  0   No current facility-administered medications on file prior to visit.   ALLERGIES Cardura and Nsaids  Past Medical History  Diagnosis Date  . Hypertension   . Diastolic dysfunction, left ventricle by ECHO 2011 03/25/2012  . Hypothyroidism 03/25/2012  . High cholesterol   . Type II diabetes mellitus   . H/O hiatal hernia   . Duodenal perforation 03/23/12  . Arthritis     "in my back"  . GERD (gastroesophageal reflux disease)        Review of Systems  Constitutional: Positive for fatigue.  All other systems reviewed and are negative.   BP 162/64  Pulse 58  Temp(Src) 98.2 F (36.8 C) (Temporal)  Resp 16  Ht 5\' 3"  (1.6 m)  Wt 125 lb (56.7 kg)  BMI 22.15 kg/m2 BP recheck 140/62    Objective:   Physical Exam  Nursing  note and vitals reviewed. Constitutional: She is oriented to person, place, and time. She appears well-developed and well-nourished. No distress.  HENT:  Head: Normocephalic and atraumatic.  Right Ear: External ear normal.  Left Ear: External ear normal.  Nose: Nose normal.  Mouth/Throat: Oropharynx is clear and moist.  Eyes: Conjunctivae and EOM are normal.  Neck: Normal range of motion. Neck supple. No JVD present. No thyromegaly present.  Cardiovascular: Normal rate, regular rhythm, normal heart sounds and intact distal pulses.   Pulmonary/Chest: Effort normal and breath sounds normal.  Abdominal: Soft. Bowel sounds are normal. She exhibits no distension and no mass. There is no tenderness. There is no rebound and no guarding.  Musculoskeletal: Normal range of motion. She exhibits no edema and no tenderness.  Lymphadenopathy:    She has no cervical adenopathy.  Neurological: She is alert and oriented to person, place, and time. No cranial  nerve deficit.  Skin: Skin is warm and dry. No rash noted. No erythema. No pallor.  Psychiatric: She has a normal mood and affect. Her behavior is normal. Judgment and thought content normal.          Assessment & Plan:  1. HTN- Check BP call if >130/80, increase cardio, RECHECK LABS, verfiy RX with family 2. Fatigue- check labs, increase activity and H2O

## 2013-11-14 NOTE — Patient Instructions (Signed)
Hypertension Hypertension is another name for high blood pressure. High blood pressure may mean that your heart needs to work harder to pump blood. Blood pressure consists of two numbers, which includes a higher number over a lower number (example: 110/72). HOME CARE   Make lifestyle changes as told by your doctor. This may include weight loss and exercise.  Take your blood pressure medicine every day.  Limit how much salt you use.  Stop smoking if you smoke.  Do not use drugs.  Talk to your doctor if you are using decongestants or birth control pills. These medicines might make blood pressure higher.  Females should not drink more than 1 alcoholic drink per day. Males should not drink more than 2 alcoholic drinks per day.  See your doctor as told. GET HELP RIGHT AWAY IF:   You have a blood pressure reading with a top number of 180 or higher.  You get a very bad headache.  You get blurred or changing vision.  You feel confused.  You feel weak, numb, or faint.  You get chest or belly (abdominal) pain.  You throw up (vomit).  You cannot breathe very well. MAKE SURE YOU:   Understand these instructions.  Will watch your condition.  Will get help right away if you are not doing well or get worse. Document Released: 04/06/2008 Document Revised: 01/11/2012 Document Reviewed: 04/06/2008 [employer] Patient Information 2014 [employer].  

## 2013-11-15 LAB — TSH: TSH: 1.22 u[IU]/mL (ref 0.350–4.500)

## 2013-11-18 ENCOUNTER — Other Ambulatory Visit: Payer: Self-pay | Admitting: Nurse Practitioner

## 2013-11-29 ENCOUNTER — Other Ambulatory Visit: Payer: Self-pay | Admitting: Nurse Practitioner

## 2013-12-01 ENCOUNTER — Other Ambulatory Visit: Payer: Self-pay | Admitting: Internal Medicine

## 2013-12-01 ENCOUNTER — Other Ambulatory Visit: Payer: Self-pay | Admitting: Emergency Medicine

## 2013-12-01 MED ORDER — BISOPROLOL FUMARATE 5 MG PO TABS: 5.0000 mg | ORAL_TABLET | Freq: Every day | ORAL | Status: DC

## 2013-12-01 MED ORDER — DONEPEZIL HCL 10 MG PO TABS: 10.0000 mg | ORAL_TABLET | Freq: Every day | ORAL | Status: DC

## 2013-12-01 MED ORDER — LISINOPRIL 40 MG PO TABS: 40.0000 mg | ORAL_TABLET | Freq: Every day | ORAL | Status: DC

## 2013-12-05 ENCOUNTER — Other Ambulatory Visit: Payer: Self-pay | Admitting: *Deleted

## 2013-12-05 MED ORDER — DONEPEZIL HCL 10 MG PO TABS: 10.0000 mg | ORAL_TABLET | Freq: Every day | ORAL | Status: DC

## 2013-12-06 ENCOUNTER — Other Ambulatory Visit: Payer: Self-pay | Admitting: *Deleted

## 2013-12-06 MED ORDER — DONEPEZIL HCL 10 MG PO TABS: 10.0000 mg | ORAL_TABLET | Freq: Every day | ORAL | Status: DC

## 2013-12-06 MED ORDER — POTASSIUM CHLORIDE CRYS ER 20 MEQ PO TBCR: 20.0000 meq | EXTENDED_RELEASE_TABLET | Freq: Two times a day (BID) | ORAL | Status: DC

## 2013-12-06 MED ORDER — LISINOPRIL 40 MG PO TABS: 40.0000 mg | ORAL_TABLET | Freq: Every day | ORAL | Status: DC

## 2013-12-06 MED ORDER — BISOPROLOL FUMARATE 5 MG PO TABS: 5.0000 mg | ORAL_TABLET | Freq: Every day | ORAL | Status: DC

## 2013-12-06 MED ORDER — LEVOTHYROXINE SODIUM 50 MCG PO TABS: 50.0000 ug | ORAL_TABLET | Freq: Every day | ORAL | Status: DC

## 2013-12-12 ENCOUNTER — Ambulatory Visit: Payer: Self-pay | Admitting: Internal Medicine

## 2013-12-12 ENCOUNTER — Ambulatory Visit (INDEPENDENT_AMBULATORY_CARE_PROVIDER_SITE_OTHER): Payer: Medicare Other | Admitting: Internal Medicine

## 2013-12-12 ENCOUNTER — Encounter: Payer: Self-pay | Admitting: Internal Medicine

## 2013-12-12 VITALS — BP 152/78 | HR 68 | Temp 98.2°F | Resp 16 | Wt 122.2 lb

## 2013-12-12 DIAGNOSIS — E559 Vitamin D deficiency, unspecified: Secondary | ICD-10-CM

## 2013-12-12 DIAGNOSIS — Z79899 Other long term (current) drug therapy: Secondary | ICD-10-CM

## 2013-12-12 DIAGNOSIS — I10 Essential (primary) hypertension: Secondary | ICD-10-CM | POA: Insufficient documentation

## 2013-12-12 DIAGNOSIS — E1129 Type 2 diabetes mellitus with other diabetic kidney complication: Secondary | ICD-10-CM

## 2013-12-12 LAB — CBC WITH DIFFERENTIAL/PLATELET
Basophils Absolute: 0.1 10*3/uL (ref 0.0–0.1)
Basophils Relative: 1 % (ref 0–1)
Eosinophils Absolute: 0.2 10*3/uL (ref 0.0–0.7)
Eosinophils Relative: 3 % (ref 0–5)
HCT: 38.5 % (ref 36.0–46.0)
HEMOGLOBIN: 12.7 g/dL (ref 12.0–15.0)
LYMPHS ABS: 2.5 10*3/uL (ref 0.7–4.0)
Lymphocytes Relative: 42 % (ref 12–46)
MCH: 29.6 pg (ref 26.0–34.0)
MCHC: 33 g/dL (ref 30.0–36.0)
MCV: 89.7 fL (ref 78.0–100.0)
MONOS PCT: 10 % (ref 3–12)
Monocytes Absolute: 0.6 10*3/uL (ref 0.1–1.0)
Neutro Abs: 2.7 10*3/uL (ref 1.7–7.7)
Neutrophils Relative %: 44 % (ref 43–77)
Platelets: 168 10*3/uL (ref 150–400)
RBC: 4.29 MIL/uL (ref 3.87–5.11)
RDW: 14.5 % (ref 11.5–15.5)
WBC: 5.9 10*3/uL (ref 4.0–10.5)

## 2013-12-12 LAB — HEPATIC FUNCTION PANEL
ALK PHOS: 63 U/L (ref 39–117)
ALT: 12 U/L (ref 0–35)
AST: 17 U/L (ref 0–37)
Albumin: 3.6 g/dL (ref 3.5–5.2)
BILIRUBIN DIRECT: 0.2 mg/dL (ref 0.0–0.3)
Indirect Bilirubin: 0.7 mg/dL (ref 0.2–1.2)
Total Bilirubin: 0.9 mg/dL (ref 0.2–1.2)
Total Protein: 6.4 g/dL (ref 6.0–8.3)

## 2013-12-12 LAB — BASIC METABOLIC PANEL WITH GFR
BUN: 15 mg/dL (ref 6–23)
CALCIUM: 9.8 mg/dL (ref 8.4–10.5)
CHLORIDE: 103 meq/L (ref 96–112)
CO2: 31 meq/L (ref 19–32)
Creat: 0.59 mg/dL (ref 0.50–1.10)
GFR, Est African American: >89 mL/min
GFR, Est Non African American: 84 mL/min
GLUCOSE: 87 mg/dL (ref 70–99)
POTASSIUM: 3.8 meq/L (ref 3.5–5.3)
Sodium: 142 mEq/L (ref 135–145)

## 2013-12-12 LAB — HEMOGLOBIN A1C
Hgb A1c MFr Bld: 6.3 % — ABNORMAL HIGH (ref <5.7)
MEAN PLASMA GLUCOSE: 134 mg/dL — AB (ref <117)

## 2013-12-12 LAB — MAGNESIUM: MAGNESIUM: 2 mg/dL (ref 1.5–2.5)

## 2013-12-12 MED ORDER — BISOPROLOL-HYDROCHLOROTHIAZIDE 5-6.25 MG PO TABS: 1.0000 | ORAL_TABLET | Freq: Every day | ORAL | Status: DC

## 2013-12-12 NOTE — Progress Notes (Signed)
Subjective:    Patient ID: Megan [name], female    DOB: [birth year], 80 y.o.   MRN: [id number]  Hypertension This is a chronic problem. The current episode started more than 1 year ago. The problem is unchanged. The problem is controlled. Associated symptoms include peripheral edema. Pertinent negatives include no anxiety, blurred vision, chest pain, headaches, malaise/fatigue, neck pain, orthopnea, palpitations, PND, shortness of breath or sweats. Agents associated with hypertension include thyroid hormones. Risk factors for coronary artery disease include diabetes mellitus, dyslipidemia and family history. The current treatment provides mild improvement. Compliance problems include medication cost.  Hypertensive end-organ damage includes kidney disease. There is no history of angina or CAD/MI. Identifiable causes of hypertension include chronic renal disease.     She presents today for 1 month follow up and labs. At recent OV she was noted mildly abnormal CBC and today she is noted to have slightly elevated BP and attendent and patient both expressed concern about the exuberant cost of one of her medications (eg Bidil) and in review of her med list and her medicine bottles some discrepancies were noted and therefore some adjustments were made d/c'ing her Bidil and bisoprolol and continuing her gZiac 5.    Medication List       This list is accurate as of: 12/12/13  8:43 PM.  Always use your most recent med list.               aspirin 81 MG chewable tablet  Chew 1 tablet (81 mg total) by mouth daily.     bisoprolol-hydrochlorothiazide 5-6.25 MG per tablet  Commonly known as:  ZIAC  Take 1 tablet by mouth daily.     clorazepate 3.75 MG tablet  Commonly known as:  TRANXENE  Take 1 tablet (3.75 mg total) by mouth 3 (three) times daily as needed for anxiety.     docusate 50 MG/5ML liquid  Commonly known as:  COLACE  Take 10 mLs (100 mg total) by mouth 2 (two) times daily.     donepezil 10 MG tablet  Commonly known as:  ARICEPT  Take 1 tablet (10 mg total) by mouth daily.     levothyroxine 50 MCG tablet  Commonly known as:  SYNTHROID, LEVOTHROID  Take 1 tablet (50 mcg total) by mouth daily before breakfast.     lisinopril 40 MG tablet  Commonly known as:  PRINIVIL,ZESTRIL  Take 1 tablet (40 mg total) by mouth daily.     pantoprazole 40 MG tablet  Commonly known as:  PROTONIX  Take 40 mg by mouth daily.     polyethylene glycol packet  Commonly known as:  MIRALAX / GLYCOLAX  Take 17 g by mouth daily.     potassium chloride SA 20 MEQ tablet  Commonly known as:  K-DUR,KLOR-CON  Take 1 tablet (20 mEq total) by mouth 2 (two) times daily.     Travoprost (BAK Free) 0.004 % Soln ophthalmic solution  Commonly known as:  TRAVATAN  Place 1 drop into both eyes at bedtime.     Vitamin D 2000 UNITS tablet  Take 2,000 Units by mouth daily.       Allergies  Allergen Reactions  . Cardura [Doxazosin]   . Nsaids    Past Medical History  Diagnosis Date  . Hypertension   . Diastolic dysfunction, left ventricle by ECHO 2011 03/25/2012  . Hypothyroidism 03/25/2012  . High cholesterol   . Type II diabetes mellitus   . H/O hiatal hernia   .  Duodenal perforation 03/23/12  . Arthritis     "in my back"  . GERD (gastroesophageal reflux disease)      Review of Systems  Constitutional: Negative for malaise/fatigue.  Eyes: Negative for blurred vision.  Respiratory: Negative for shortness of breath.   Cardiovascular: Negative for chest pain, palpitations, orthopnea and PND.  Musculoskeletal: Negative for neck pain.  Neurological: Negative for headaches.       Objective:   Physical Exam  Constitutional: She is oriented to person, place, and time.  Elderly lady in no distress  HENT:  Head: Normocephalic.  Mouth/Throat: No oropharyngeal exudate.  Eyes: EOM are normal. Pupils are equal, round, and reactive to light.  Neck: Normal range of motion. Neck supple.  No JVD present. No thyromegaly present.  Cardiovascular: Normal rate, regular rhythm and normal heart sounds.  Exam reveals no gallop.   No murmur heard. Pulmonary/Chest: Effort normal and breath sounds normal. She has no wheezes. She has no rales.  Abdominal: Soft. She exhibits no distension and no mass. There is no tenderness. There is no guarding.  Musculoskeletal: Normal range of motion. She exhibits no edema and no tenderness.  Lymphadenopathy:    She has no cervical adenopathy.  Neurological: She is alert and oriented to person, place, and time. A cranial nerve deficit is present. Coordination normal.  Skin: Skin is warm and dry.   Assessment & Plan:   1. Hypertension  - bisoprolol-hydrochlorothiazide (ZIAC) 5-6.25 MG per tablet; Take 1 tablet by mouth daily.  Dispense: 90 tablet; Refill: 99 - TSH  2. Vitamin D Deficiency  - Vit D  25 hydroxy (rtn osteoporosis monitoring)  3. T2 NIDDM w/Nephropathy - Hemoglobin A1c - Insulin, fasting  4. Encounter for long-term (current) use of other medications  - CBC with Differential - BASIC METABOLIC PANEL WITH GFR - Hepatic function panel - Magnesium

## 2013-12-12 NOTE — Patient Instructions (Signed)
 Hypertension As your heart beats, it forces blood through your arteries. This force is your blood pressure. If the pressure is too high, it is called hypertension (HTN) or high blood pressure. HTN is dangerous because you may have it and not know it. High blood pressure may mean that your heart has to work harder to pump blood. Your arteries may be narrow or stiff. The extra work puts you at risk for heart disease, stroke, and other problems.  Blood pressure consists of two numbers, a higher number over a lower, 110/72, for example. It is stated as "110 over 72." The ideal is below 120 for the top number (systolic) and under 80 for the bottom (diastolic). Write down your blood pressure today. You should pay close attention to your blood pressure if you have certain conditions such as:  Heart failure.  Prior heart attack.  Diabetes  Chronic kidney disease.  Prior stroke.  Multiple risk factors for heart disease. To see if you have HTN, your blood pressure should be measured while you are seated with your arm held at the level of the heart. It should be measured at least twice. A one-time elevated blood pressure reading (especially in the Emergency Department) does not mean that you need treatment. There may be conditions in which the blood pressure is different between your right and left arms. It is important to see your caregiver soon for a recheck. Most people have essential hypertension which means that there is not a specific cause. This type of high blood pressure may be lowered by changing lifestyle factors such as:  Stress.  Smoking.  Lack of exercise.  Excessive weight.  Drug/tobacco/alcohol use.  Eating less salt. Most people do not have symptoms from high blood pressure until it has caused damage to the body. Effective treatment can often prevent, delay or reduce that damage. TREATMENT  When a cause has been identified, treatment for high blood pressure is directed at  the cause. There are a large number of medications to treat HTN. These fall into several categories, and your caregiver will help you select the medicines that are best for you. Medications may have side effects. You should review side effects with your caregiver. If your blood pressure stays high after you have made lifestyle changes or started on medicines,   Your medication(s) may need to be changed.  Other problems may need to be addressed.  Be certain you understand your prescriptions, and know how and when to take your medicine.  Be sure to follow up with your caregiver within the time frame advised (usually within two weeks) to have your blood pressure rechecked and to review your medications.  If you are taking more than one medicine to lower your blood pressure, make sure you know how and at what times they should be taken. Taking two medicines at the same time can result in blood pressure that is too low. SEEK IMMEDIATE MEDICAL CARE IF:  You develop a severe headache, blurred or changing vision, or confusion.  You have unusual weakness or numbness, or a faint feeling.  You have severe chest or abdominal pain, vomiting, or breathing problems. MAKE SURE YOU:   Understand these instructions.  Will watch your condition.  Will get help right away if you are not doing well or get worse.   Diabetes and Exercise Exercising regularly is important. It is not just about losing weight. It has many health benefits, such as:  Improving your overall fitness, flexibility, and   endurance.  Increasing your bone density.  Helping with weight control.  Decreasing your body fat.  Increasing your muscle strength.  Reducing stress and tension.  Improving your overall health. People with diabetes who exercise gain additional benefits because exercise:  Reduces appetite.  Improves the body's use of blood sugar (glucose).  Helps lower or control blood glucose.  Decreases blood  pressure.  Helps control blood lipids (such as cholesterol and triglycerides).  Improves the body's use of the hormone insulin by:  Increasing the body's insulin sensitivity.  Reducing the body's insulin needs.  Decreases the risk for heart disease because exercising:  Lowers cholesterol and triglycerides levels.  Increases the levels of good cholesterol (such as high-density lipoproteins [HDL]) in the body.  Lowers blood glucose levels. YOUR ACTIVITY PLAN  Choose an activity that you enjoy and set realistic goals. Your health care provider or diabetes educator can help you make an activity plan that works for you. You can break activities into 2 or 3 sessions throughout the day. Doing so is as good as one long session. Exercise ideas include:  Taking the dog for a walk.  Taking the stairs instead of the elevator.  Dancing to your favorite song.  Doing your favorite exercise with a friend. RECOMMENDATIONS FOR EXERCISING WITH TYPE 1 OR TYPE 2 DIABETES   Check your blood glucose before exercising. If blood glucose levels are greater than 240 mg/dL, check for urine ketones. Do not exercise if ketones are present.  Avoid injecting insulin into areas of the body that are going to be exercised. For example, avoid injecting insulin into:  The arms when playing tennis.  The legs when jogging.  Keep a record of:  Food intake before and after you exercise.  Expected peak times of insulin action.  Blood glucose levels before and after you exercise.  The type and amount of exercise you have done.  Review your records with your health care provider. Your health care provider will help you to develop guidelines for adjusting food intake and insulin amounts before and after exercising.  If you take insulin or oral hypoglycemic agents, watch for signs and symptoms of hypoglycemia. They include:  Dizziness.  Shaking.  Sweating.  Chills.  Confusion.  Drink plenty of water  while you exercise to prevent dehydration or heat stroke. Body water is lost during exercise and must be replaced.  Talk to your health care provider before starting an exercise program to make sure it is safe for you. Remember, almost any type of activity is better than none.    Cholesterol Cholesterol is a white, waxy, fat-like protein needed by your body in small amounts. The liver makes all the cholesterol you need. It is carried from the liver by the blood through the blood vessels. Deposits (plaque) may build up on blood vessel walls. This makes the arteries narrower and stiffer. Plaque increases the risk for heart attack and stroke. You cannot feel your cholesterol level even if it is very high. The only way to know is by a blood test to check your lipid (fats) levels. Once you know your cholesterol levels, you should keep a record of the test results. Work with your caregiver to to keep your levels in the desired range. WHAT THE RESULTS MEAN:  Total cholesterol is a rough measure of all the cholesterol in your blood.  LDL is the so-called bad cholesterol. This is the type that deposits cholesterol in the walls of the arteries. You want this   level to be low.  HDL is the good cholesterol because it cleans the arteries and carries the LDL away. You want this level to be high.  Triglycerides are fat that the body can either burn for energy or store. High levels are closely linked to heart disease. DESIRED LEVELS:  Total cholesterol below 200.  LDL below 100 for people at risk, below 70 for very high risk.  HDL above 50 is good, above 60 is best.  Triglycerides below 150. HOW TO LOWER YOUR CHOLESTEROL:  Diet.  Choose fish or white meat chicken and turkey, roasted or baked. Limit fatty cuts of red meat, fried foods, and processed meats, such as sausage and lunch meat.  Eat lots of fresh fruits and vegetables. Choose whole grains, beans, pasta, potatoes and cereals.  Use only  small amounts of olive, corn or canola oils. Avoid butter, mayonnaise, shortening or palm kernel oils. Avoid foods with trans-fats.  Use skim/nonfat milk and low-fat/nonfat yogurt and cheeses. Avoid whole milk, cream, ice cream, egg yolks and cheeses. Healthy desserts include angel food cake, ginger snaps, animal crackers, hard candy, popsicles, and low-fat/nonfat frozen yogurt. Avoid pastries, cakes, pies and cookies.  Exercise.  A regular program helps decrease LDL and raises HDL.  Helps with weight control.  Do things that increase your activity level like gardening, walking, or taking the stairs.  Medication.  May be prescribed by your caregiver to help lowering cholesterol and the risk for heart disease.  You may need medicine even if your levels are normal if you have several risk factors. HOME CARE INSTRUCTIONS   Follow your diet and exercise programs as suggested by your caregiver.  Take medications as directed.  Have blood work done when your caregiver feels it is necessary. MAKE SURE YOU:   Understand these instructions.  Will watch your condition.  Will get help right away if you are not doing well or get worse.      Vitamin D Deficiency Vitamin D is an important vitamin that your body needs. Having too little of it in your body is called a deficiency. A very bad deficiency can make your bones soft and can cause a condition called rickets.  Vitamin D is important to your body for different reasons, such as:   It helps your body absorb 2 minerals called calcium and phosphorus.  It helps make your bones healthy.  It may prevent some diseases, such as diabetes and multiple sclerosis.  It helps your muscles and heart. You can get vitamin D in several ways. It is a natural part of some foods. The vitamin is also added to some dairy products and cereals. Some people take vitamin D supplements. Also, your body makes vitamin D when you are in the sun. It changes the  sun's rays into a form of the vitamin that your body can use. CAUSES   Not eating enough foods that contain vitamin D.  Not getting enough sunlight.  Having certain digestive system diseases that make it hard to absorb vitamin D. These diseases include Crohn's disease, chronic pancreatitis, and cystic fibrosis.  Having a surgery in which part of the stomach or small intestine is removed.  Being obese. Fat cells pull vitamin D out of your blood. That means that obese people may not have enough vitamin D left in their blood and in other body tissues.  Having chronic kidney or liver disease. RISK FACTORS Risk factors are things that make you more likely to develop a vitamin   D deficiency. They include:  Being older.  Not being able to get outside very much.  Living in a nursing home.  Having had broken bones.  Having weak or thin bones (osteoporosis).  Having a disease or condition that changes how your body absorbs vitamin D.  Having dark skin.  Some medicines such as seizure medicines or steroids.  Being overweight or obese. SYMPTOMS Mild cases of vitamin D deficiency may not have any symptoms. If you have a very bad case, symptoms may include:  Bone pain.  Muscle pain.  Falling often.  Broken bones caused by a minor injury, due to osteoporosis. DIAGNOSIS A blood test is the best way to tell if you have a vitamin D deficiency. TREATMENT Vitamin D deficiency can be treated in different ways. Treatment for vitamin D deficiency depends on what is causing it. Options include:  Taking vitamin D supplements.  Taking a calcium supplement. Your caregiver will suggest what dose is best for you. HOME CARE INSTRUCTIONS  Take any supplements that your caregiver prescribes. Follow the directions carefully. Take only the suggested amount.  Have your blood tested 2 months after you start taking supplements.  Eat foods that contain vitamin D. Healthy choices  include:  Fortified dairy products, cereals, or juices. Fortified means vitamin D has been added to the food. Check the label on the package to be sure.  Fatty fish like salmon or trout.  Eggs.  Oysters.  Do not use a tanning bed.  Keep your weight at a healthy level. Lose weight if you need to.  Keep all follow-up appointments. Your caregiver will need to perform blood tests to make sure your vitamin D deficiency is going away. SEEK MEDICAL CARE IF:  You have any questions about your treatment.  You continue to have symptoms of vitamin D deficiency.  You have nausea or vomiting.  You are constipated.  You feel confused.  You have severe abdominal or back pain. MAKE SURE YOU:  Understand these instructions.  Will watch your condition.  Will get help right away if you are not doing well or get worse.  Hypothyroidism The thyroid is a large gland located in the lower front of your neck. The thyroid gland helps control metabolism. Metabolism is how your body handles food. It controls metabolism with the hormone thyroxine. When this gland is underactive (hypothyroid), it produces too little hormone.  CAUSES These include:   Absence or destruction of thyroid tissue.  Goiter due to iodine deficiency.  Goiter due to medications.  Congenital defects (since birth).  Problems with the pituitary. This causes a lack of TSH (thyroid stimulating hormone). This hormone tells the thyroid to turn out more hormone. SYMPTOMS  Lethargy (feeling as though you have no energy)  Cold intolerance  Weight gain (in spite of normal food intake)  Dry skin  Coarse hair  Menstrual irregularity (if severe, may lead to infertility)  Slowing of thought processes Cardiac problems are also caused by insufficient amounts of thyroid hormone. Hypothyroidism in the newborn is cretinism, and is an extreme form. It is important that this form be treated adequately and immediately or it will  lead rapidly to retarded physical and mental development. DIAGNOSIS  To prove hypothyroidism, your caregiver may do blood tests and ultrasound tests. Sometimes the signs are hidden. It may be necessary for your caregiver to watch this illness with blood tests either before or after diagnosis and treatment. TREATMENT  Low levels of thyroid hormone are increased by using   synthetic thyroid hormone. This is a safe, effective treatment. It usually takes about four weeks to gain the full effects of the medication. After you have the full effect of the medication, it will generally take another four weeks for problems to leave. Your caregiver may start you on low doses. If you have had heart problems the dose may be gradually increased. It is generally not an emergency to get rapidly to normal. HOME CARE INSTRUCTIONS   Take your medications as your caregiver suggests. Let your caregiver know of any medications you are taking or start taking. Your caregiver will help you with dosage schedules.  As your condition improves, your dosage needs may increase. It will be necessary to have continuing blood tests as suggested by your caregiver.  Report all suspected medication side effects to your caregiver. SEEK MEDICAL CARE IF: Seek medical care if you develop:  Sweating.  Tremulousness (tremors).  Anxiety.  Rapid weight loss.  Heat intolerance.  Emotional swings.  Diarrhea.  Weakness. SEEK IMMEDIATE MEDICAL CARE IF:  You develop chest pain, an irregular heart beat (palpitations), or a rapid heart beat. MAKE SURE YOU:   Understand these instructions.  Will watch your condition.  Will get help right away if you are not doing well or get worse. Document Released: 10/19/2005 Document Revised: 01/11/2012 Document Reviewed: 06/08/2008 [employer] Patient Information 2014 [employer].  

## 2013-12-13 LAB — TSH: TSH: 2.939 u[IU]/mL (ref 0.350–4.500)

## 2013-12-13 LAB — INSULIN, FASTING: INSULIN FASTING, SERUM: 6 u[IU]/mL (ref 3–28)

## 2013-12-13 LAB — VITAMIN D 25 HYDROXY (VIT D DEFICIENCY, FRACTURES): VIT D 25 HYDROXY: 47 ng/mL (ref 30–89)

## 2013-12-13 NOTE — Progress Notes (Signed)
This encounter was created in error - please disregard.

## 2013-12-17 ENCOUNTER — Other Ambulatory Visit: Payer: Self-pay | Admitting: Nurse Practitioner

## 2013-12-18 ENCOUNTER — Other Ambulatory Visit: Payer: Self-pay | Admitting: Internal Medicine

## 2013-12-26 ENCOUNTER — Ambulatory Visit: Payer: Self-pay | Admitting: Internal Medicine

## 2013-12-29 ENCOUNTER — Ambulatory Visit: Payer: Self-pay | Admitting: Emergency Medicine

## 2014-01-08 ENCOUNTER — Encounter: Payer: Self-pay | Admitting: Internal Medicine

## 2014-01-08 ENCOUNTER — Ambulatory Visit (INDEPENDENT_AMBULATORY_CARE_PROVIDER_SITE_OTHER): Payer: Medicare Other | Admitting: Internal Medicine

## 2014-01-08 VITALS — BP 126/72 | HR 72 | Temp 99.1°F | Resp 16 | Ht 68.0 in | Wt 124.2 lb

## 2014-01-08 DIAGNOSIS — R5381 Other malaise: Secondary | ICD-10-CM

## 2014-01-08 DIAGNOSIS — R5383 Other fatigue: Secondary | ICD-10-CM

## 2014-01-08 DIAGNOSIS — I10 Essential (primary) hypertension: Secondary | ICD-10-CM

## 2014-01-08 NOTE — Progress Notes (Signed)
Subjective:    Patient ID: Megan [name], female    DOB: 1927-01-15, 80 y.o.   MRN: [id number]  HPI Patient is seen today in 1 month F/U of d/c'ing her Bidil and bisoprolol and starting her on gZiac 5, altho there seems to be some confusion about what medications she is actually taking. She denies any Sx's of Ha, dizziness, CP, palpitations or edema. She does have some ongoing c/o general fatigue and malaise which is probably a result of age and deconditioning.   Medication List       aspirin 81 MG chewable tablet  Chew 1 tablet (81 mg total) by mouth daily.     bisoprolol-hydrochlorothiazide 5-6.25 MG per tablet  Commonly known as:  ZIAC  Take 1 tablet by mouth daily.     clorazepate 3.75 MG tablet  Commonly known as:  TRANXENE  Take 1 tablet (3.75 mg total) by mouth 3 (three) times daily as needed for anxiety.     docusate 50 MG/5ML liquid  Commonly known as:  COLACE  Take 10 mLs (100 mg total) by mouth 2 (two) times daily.     donepezil 10 MG tablet  Commonly known as:  ARICEPT  Take 1 tablet (10 mg total) by mouth daily.     levothyroxine 50 MCG tablet  Commonly known as:  SYNTHROID, LEVOTHROID  Take 1 tablet (50 mcg total) by mouth daily before breakfast.     lisinopril 40 MG tablet  Commonly known as:  PRINIVIL,ZESTRIL  Take 1 tablet (40 mg total) by mouth daily.     pantoprazole 40 MG tablet  Commonly known as:  PROTONIX  Take 40 mg by mouth daily.     polyethylene glycol packet  Commonly known as:  MIRALAX / GLYCOLAX  Take 17 g by mouth daily.     potassium chloride SA 20 MEQ tablet  Commonly known as:  K-DUR,KLOR-CON  Take 1 tablet (20 mEq total) by mouth 2 (two) times daily.     Travoprost (BAK Free) 0.004 % Soln ophthalmic solution  Commonly known as:  TRAVATAN  Place 1 drop into both eyes at bedtime.     Vitamin D 2000 UNITS tablet  Take 2,000 Units by mouth daily.       Allergies  Allergen Reactions  . Cardura [Doxazosin]   . Nsaids      Past Medical History  Diagnosis Date  . Hypertension   . Diastolic dysfunction, left ventricle by ECHO 2011 03/25/2012  . Hypothyroidism 03/25/2012  . High cholesterol   . Type II diabetes mellitus   . H/O hiatal hernia   . Duodenal perforation 03/23/12  . Arthritis     "in my back"  . GERD (gastroesophageal reflux disease)    Review of Systems n12 point Systems Review totally negative  BP: 126/72  Pulse: 72  Temp: 99.1 F (37.3 C)  Resp: 16   Objective:   Physical ExamHEENT - Eac's patent. TM's Nl.EOM's full. PERRLA. NasoOroPharynx clear. Neck - supple. Nl Thyroid. No bruits nodes JVD Chest - Clear equal BS Cor - Nl HS. RRR w/o sig MGR. PP 1(+) No edema. Abd - No palpable organomegaly, masses or tenderness. BS nl. MS- FROM. w/o deformities. Muscle power tone and bulk is decreased.. Gait Nl. Neuro - No obvious Cr N abnormalities. Sensory, motor and Cerebellar functions appear Nl w/o focal abnormalities.  Assessment & Plan:  1. Hypertension - controlled at goal Meds reviewed with patient and encouraged her to confirm when she  returns home.  2. Malaise and fatigue  ROV 2 mo, 5 mo and CPE end Oct 2015.

## 2014-02-13 ENCOUNTER — Ambulatory Visit: Payer: Self-pay | Admitting: Physician Assistant

## 2014-02-19 ENCOUNTER — Encounter: Payer: Self-pay | Admitting: Physician Assistant

## 2014-02-19 ENCOUNTER — Ambulatory Visit (INDEPENDENT_AMBULATORY_CARE_PROVIDER_SITE_OTHER): Payer: Medicare Other | Admitting: Physician Assistant

## 2014-02-19 VITALS — BP 122/64 | HR 56 | Temp 98.3°F | Resp 16 | Ht 63.0 in | Wt 121.0 lb

## 2014-02-19 DIAGNOSIS — Z1331 Encounter for screening for depression: Secondary | ICD-10-CM

## 2014-02-19 DIAGNOSIS — E538 Deficiency of other specified B group vitamins: Secondary | ICD-10-CM

## 2014-02-19 DIAGNOSIS — R4182 Altered mental status, unspecified: Secondary | ICD-10-CM

## 2014-02-19 DIAGNOSIS — Z111 Encounter for screening for respiratory tuberculosis: Secondary | ICD-10-CM

## 2014-02-19 DIAGNOSIS — F0390 Unspecified dementia without behavioral disturbance: Secondary | ICD-10-CM

## 2014-02-19 DIAGNOSIS — N3000 Acute cystitis without hematuria: Secondary | ICD-10-CM

## 2014-02-19 LAB — CBC WITH DIFFERENTIAL/PLATELET
Basophils Absolute: 0.1 10*3/uL (ref 0.0–0.1)
Basophils Relative: 1 % (ref 0–1)
EOS ABS: 0.3 10*3/uL (ref 0.0–0.7)
EOS PCT: 6 % — AB (ref 0–5)
HEMATOCRIT: 38.7 % (ref 36.0–46.0)
Hemoglobin: 13 g/dL (ref 12.0–15.0)
Lymphocytes Relative: 47 % — ABNORMAL HIGH (ref 12–46)
Lymphs Abs: 2.4 10*3/uL (ref 0.7–4.0)
MCH: 28.9 pg (ref 26.0–34.0)
MCHC: 33.6 g/dL (ref 30.0–36.0)
MCV: 86 fL (ref 78.0–100.0)
MONO ABS: 0.4 10*3/uL (ref 0.1–1.0)
Monocytes Relative: 8 % (ref 3–12)
Neutro Abs: 2 10*3/uL (ref 1.7–7.7)
Neutrophils Relative %: 38 % — ABNORMAL LOW (ref 43–77)
Platelets: 172 10*3/uL (ref 150–400)
RBC: 4.5 MIL/uL (ref 3.87–5.11)
RDW: 14.1 % (ref 11.5–15.5)
WBC: 5.2 10*3/uL (ref 4.0–10.5)

## 2014-02-19 LAB — BASIC METABOLIC PANEL WITH GFR
BUN: 30 mg/dL — AB (ref 6–23)
CHLORIDE: 100 meq/L (ref 96–112)
CO2: 31 mEq/L (ref 19–32)
CREATININE: 1.4 mg/dL — AB (ref 0.50–1.10)
Calcium: 9.9 mg/dL (ref 8.4–10.5)
GFR, EST AFRICAN AMERICAN: 40 mL/min — AB
GFR, Est Non African American: 35 mL/min — ABNORMAL LOW
Glucose, Bld: 123 mg/dL — ABNORMAL HIGH (ref 70–99)
Potassium: 4.2 mEq/L (ref 3.5–5.3)
Sodium: 141 mEq/L (ref 135–145)

## 2014-02-19 LAB — HEPATIC FUNCTION PANEL
ALBUMIN: 3.9 g/dL (ref 3.5–5.2)
ALK PHOS: 66 U/L (ref 39–117)
ALT: <8 U/L (ref 0–35)
AST: 17 U/L (ref 0–37)
Bilirubin, Direct: 0.1 mg/dL (ref 0.0–0.3)
Indirect Bilirubin: 0.6 mg/dL (ref 0.2–1.2)
TOTAL PROTEIN: 7 g/dL (ref 6.0–8.3)
Total Bilirubin: 0.7 mg/dL (ref 0.2–1.2)

## 2014-02-19 MED ORDER — CITALOPRAM HYDROBROMIDE 10 MG PO TABS: 10.0000 mg | ORAL_TABLET | Freq: Every day | ORAL | Status: DC

## 2014-02-19 MED ORDER — CITALOPRAM HYDROBROMIDE 20 MG PO TABS: 20.0000 mg | ORAL_TABLET | Freq: Every day | ORAL | Status: DC

## 2014-02-19 NOTE — Patient Instructions (Signed)
Confusion °Confusion is the inability to think with your usual speed or clarity. Confusion may come on quickly or slowly over time. How quickly the confusion comes on depends on the cause. Confusion can be due to any number of causes. °CAUSES  °· Concussion, head injury, or head trauma. °· Seizures. °· Stroke. °· Fever. °· Senility. °· Heightened emotional states like rage or terror. °· Mental illness in which the person loses the ability to determine what is real and what is not (hallucinations). °· Infections. °· Toxic effects from alcohol, drugs, or prescription medicines. °· Dehydration and an imbalance of salts in the body (electrolytes). °· Lack of sleep. °· Low blood sugar (diabetes). °· Low levels of oxygen (for example from chronic lung disorders). °· Drug interactions or other medication side effects. °· Nutritional deficiencies, especially niacin, thiamine, vitamin C, or vitamin B. °· Sudden drop in body temperature (hypothermia). °· Illness in the elderly. Constipation can result in confusion. An elderly person who is hospitalized may become confused due to change in daily routine. °SYMPTOMS  °People often describe their thinking as cloudy or unclear when they are confused. Confusion can also include feeling disoriented. That means you are unaware of where or who you are. You may also not know what the date or time is. If confused, you may also have difficulty paying attention, remembering and making decisions. Some people also act aggressively when they are confused.  °DIAGNOSIS  °The medical evaluation of confusion may include: °· Blood and urine tests. °· X-rays. °· Brain and nervous system tests. °· Analyzing your brain waves (electroencphalogram or EEG). °· A special X-ray (MRI) of your head or other special studies. °Your physician will ask questions such as: °· Do you get days and nights mixed up? °· Are you awake during regular sleep times? °· Do you have trouble recognizing people? °· Do you  know where you are? °· Do you know the date and time? °· Does the confusion come and go? °· Is the confusion quickly getting worse? °· Has there been a recent illness? °· Has there been a recent head injury? °· Are you diabetic? °· Do you have a lung disorder? °· What medication are you taking? °· Have you taken drugs or alcohol? °TREATMENT  °An admission to the hospital may not be needed, but a confused person should not be left alone. Stay with a family member or friend until the confusion clears. Avoid alcohol, pain relievers or sedative drugs until you have fully recovered. Do not drive until your caregiver says it is okay. °HOME CARE INSTRUCTIONS °What family and friends can do: °· To find out if someone is confused ask him or her their name, age, and the date. If the person is unsure or answers incorrectly, he or she is confused. °· Always introduce yourself, no matter how well the person knows you. °· Often remind the person of his or her location. °· Place a calendar and clock near the confused person. °· Talk about current events and plans for the day. °· Try to keep the environment calm, quiet and peaceful. °· Make sure the patient keeps follow up appointments with their physician. °PREVENTION  °Ways to prevent confusion: °· Avoid alcohol. °· Eat a balanced diet. °· Get enough sleep. °· Do not become isolated. Spend time with other people and make plans for your days. °· Keep careful watch on your blood sugar levels if you are diabetic. °SEEK IMMEDIATE MEDICAL CARE IF:  °· You develop   severe headaches, repeated vomiting, seizures, blackouts or slurred speech. °· There is increasing confusion, weakness, numbness, restlessness or personality changes. °· You develop a loss of balance, have marked dizziness, feel uncoordinated or fall. °· You have delusions, hallucinations or develop severe anxiety. °· Your family members think you need to be rechecked. °Document Released: 11/26/2004 Document Revised:  01/11/2012 Document Reviewed: 07/24/2008 °[employer]® Patient Information ©2014 [employer]. ° °

## 2014-02-19 NOTE — Progress Notes (Signed)
   Subjective:    Patient ID: Megan [name], female    DOB: 1927/09/13, 80 y.o.   MRN: [id number]  HPI 80 y.o. with dementia presents for evaluation with her two daughters. Her daughters states that her dementia has been getting progressively worse. She is having trouble showering herself but only has a tub, her house is not handicapped accessible and she has fallen several occasions without LOC. They constantly stay with her 24 hours but feel her care may be better served at a skilled nursing facility or assisted living home. She states her husband passed 3 weeks ago which has caused her depression,  And decreased appetite since he has passed and it is worse. She states she sleeps often. Has poor dentition, she is chewing snuff right now. She has some urinary incontinence.   Wt Readings from Last 3 Encounters:  02/19/14 121 lb (54.885 kg)  01/08/14 124 lb 3.2 oz (56.337 kg)  12/12/13 122 lb 3.2 oz (55.43 kg)   Body mass index is 21.44 kg/(m^2).  Review of Systems  Constitutional: Positive for appetite change and fatigue. Negative for fever, chills, diaphoresis, activity change and unexpected weight change.  HENT: Negative.   Eyes: Negative.   Respiratory: Negative.   Cardiovascular: Negative.   Gastrointestinal: Negative.   Endocrine: Negative.   Genitourinary: Negative.   Musculoskeletal: Positive for arthralgias and gait problem.  Skin: Negative.   Neurological: Positive for dizziness. Negative for tremors, seizures, syncope, facial asymmetry, speech difficulty, weakness, light-headedness, numbness and headaches.  Psychiatric/Behavioral: Positive for confusion, sleep disturbance, dysphoric mood and decreased concentration. Negative for suicidal ideas, hallucinations, behavioral problems, self-injury and agitation. The patient is not nervous/anxious and is not hyperactive.        Objective:   Physical Exam  Constitutional: She is oriented to person, place, and time. Vital signs are  normal. She appears cachectic. She is cooperative.  Elderly lady in no distress  HENT:  Head: Normocephalic.  Mouth/Throat: No oropharyngeal exudate.  Eyes: EOM are normal. Pupils are equal, round, and reactive to light.  Neck: Normal range of motion. Neck supple. No JVD present. No thyromegaly present.  Cardiovascular: Normal rate, regular rhythm and normal heart sounds.  Exam reveals no gallop.   No murmur heard. Pulmonary/Chest: Effort normal and breath sounds normal. She has no wheezes. She has no rales.  Abdominal: Soft. She exhibits no distension and no mass. There is no tenderness. There is no guarding.  Musculoskeletal: Normal range of motion. She exhibits no edema and no tenderness.  Walks with walker, shuffling gait  Lymphadenopathy:    She has no cervical adenopathy.  Neurological: She is alert and oriented to person, place, and time. No cranial nerve deficit or sensory deficit. Gait abnormal.  Skin: Skin is warm and dry.  Psychiatric: Her speech is delayed. She is agitated. Cognition and memory are impaired. She exhibits a depressed mood.  19/30 Mini Mental status exam      Assessment & Plan:  Confusion- some disorientation but she is also depressed- will try celexa 20mg  start 1/2 daily- follow up in 1 month, Recent CT scan head negative, check labs rule out infection,  filling out form for a potential SNIF, follow up in 1 month.

## 2014-02-20 DIAGNOSIS — Z0000 Encounter for general adult medical examination without abnormal findings: Secondary | ICD-10-CM

## 2014-02-20 LAB — URINALYSIS, ROUTINE W REFLEX MICROSCOPIC
BILIRUBIN URINE: NEGATIVE
Glucose, UA: NEGATIVE mg/dL
Hgb urine dipstick: NEGATIVE
Ketones, ur: NEGATIVE mg/dL
Nitrite: NEGATIVE
Protein, ur: NEGATIVE mg/dL
Specific Gravity, Urine: 1.016 (ref 1.005–1.030)
UROBILINOGEN UA: 0.2 mg/dL (ref 0.0–1.0)
pH: 5.5 (ref 5.0–8.0)

## 2014-02-20 LAB — URINALYSIS, MICROSCOPIC ONLY
BACTERIA UA: NONE SEEN
Casts: NONE SEEN
Crystals: NONE SEEN

## 2014-02-20 LAB — URINE CULTURE
COLONY COUNT: NO GROWTH
Organism ID, Bacteria: NO GROWTH

## 2014-02-20 LAB — VITAMIN B12: Vitamin B-12: 307 pg/mL (ref 211–911)

## 2014-02-20 LAB — TSH: TSH: 1.293 u[IU]/mL (ref 0.350–4.500)

## 2014-02-20 MED ORDER — CIPROFLOXACIN HCL 500 MG PO TABS: 500.0000 mg | ORAL_TABLET | Freq: Two times a day (BID) | ORAL | Status: AC

## 2014-02-20 NOTE — Addendum Note (Signed)
Addended by: Quentin MullingOLLIER, Adlene Adduci R on: 02/20/2014 08:31 AM   Modules accepted: Orders

## 2014-02-21 LAB — TB SKIN TEST
INDURATION: 0 mm
TB Skin Test: NEGATIVE

## 2014-02-26 ENCOUNTER — Other Ambulatory Visit: Payer: Self-pay | Admitting: Internal Medicine

## 2014-02-26 ENCOUNTER — Other Ambulatory Visit: Payer: Self-pay | Admitting: Emergency Medicine

## 2014-02-26 MED ORDER — CLORAZEPATE DIPOTASSIUM 3.75 MG PO TABS: 3.7500 mg | ORAL_TABLET | Freq: Three times a day (TID) | ORAL | Status: DC

## 2014-03-06 ENCOUNTER — Ambulatory Visit: Payer: Self-pay

## 2014-03-10 ENCOUNTER — Other Ambulatory Visit: Payer: Self-pay | Admitting: Nurse Practitioner

## 2014-03-12 ENCOUNTER — Other Ambulatory Visit: Payer: Self-pay | Admitting: Internal Medicine

## 2014-03-23 ENCOUNTER — Ambulatory Visit (INDEPENDENT_AMBULATORY_CARE_PROVIDER_SITE_OTHER): Payer: Medicare Other | Admitting: Emergency Medicine

## 2014-03-23 ENCOUNTER — Encounter: Payer: Self-pay | Admitting: Emergency Medicine

## 2014-03-23 VITALS — BP 156/64 | HR 58 | Temp 98.6°F | Resp 16 | Ht 63.0 in | Wt 125.0 lb

## 2014-03-23 DIAGNOSIS — I10 Essential (primary) hypertension: Secondary | ICD-10-CM

## 2014-03-23 DIAGNOSIS — R6889 Other general symptoms and signs: Secondary | ICD-10-CM

## 2014-03-23 LAB — BASIC METABOLIC PANEL WITH GFR
BUN: 15 mg/dL (ref 6–23)
CHLORIDE: 101 meq/L (ref 96–112)
CO2: 28 mEq/L (ref 19–32)
Calcium: 9.6 mg/dL (ref 8.4–10.5)
Creat: 0.73 mg/dL (ref 0.50–1.10)
GFR, EST NON AFRICAN AMERICAN: 76 mL/min
GFR, Est African American: 87 mL/min
Glucose, Bld: 124 mg/dL — ABNORMAL HIGH (ref 70–99)
POTASSIUM: 4.3 meq/L (ref 3.5–5.3)
Sodium: 139 mEq/L (ref 135–145)

## 2014-03-23 MED ORDER — DONEPEZIL HCL 10 MG PO TABS: 10.0000 mg | ORAL_TABLET | Freq: Every day | ORAL | Status: DC

## 2014-03-23 MED ORDER — POTASSIUM CHLORIDE CRYS ER 20 MEQ PO TBCR: EXTENDED_RELEASE_TABLET | ORAL | Status: DC

## 2014-03-23 NOTE — Progress Notes (Signed)
Subjective:    Patient ID: Megan [name], female    DOB: [birth year], 80 y.o.   MRN: [id number]  HPI Comments: 79 yo pleasant/ Alert AAF presents for f/u for concerns of confusion at last OV. Patient notes she is not confused and thinks she was depressed at last OV with loss of her husband so recent. She is eating better and feels good. She has 9 children who try to help take care of her. She notes they often try to boss her around a little more than they should but she is trying to do as they wish. 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 She note she needs a refill on memory medicine and has been out for several days.      Medication List       This list is accurate as of: 03/23/14 11:04 AM.  Always use your most recent med list.               aspirin 81 MG chewable tablet  Chew 1 tablet (81 mg total) by mouth daily.     bisoprolol-hydrochlorothiazide 5-6.25 MG per tablet  Commonly known as:  ZIAC  Take 1 tablet by mouth daily.     citalopram 10 MG tablet  Commonly known as:  CELEXA  Take 1 tablet (10 mg total) by mouth daily.     clorazepate 3.75 MG tablet  Commonly known as:  TRANXENE-T  Take 1 tablet (3.75 mg total) by mouth 3 (three) times daily.     donepezil 10 MG tablet  Commonly known as:  ARICEPT  Take 1 tablet (10 mg total) by mouth daily.     isosorbide-hydrALAZINE 20-37.5 MG per tablet  Commonly known as:  BIDIL  Take 1 tablet by mouth 3 (three) times daily.     KLOR-CON M20 20 MEQ tablet  Generic drug:  potassium chloride SA  TAKE 1 TABLET BY MOUTH TWICE A DAY     levothyroxine 50 MCG tablet  Commonly known as:  SYNTHROID, LEVOTHROID  Take 1 tablet (50 mcg total) by mouth daily before breakfast.     lisinopril 40 MG tablet  Commonly known as:  PRINIVIL,ZESTRIL  Take 1 tablet (40 mg total) by mouth daily.     pantoprazole 40 MG tablet  Commonly known as:  PROTONIX  Take 40 mg by mouth daily.     Vitamin D 2000 UNITS tablet   Take 2,000 Units by mouth daily.       Allergies  Allergen Reactions  . Cardura [Doxazosin]   . Nsaids    Past Medical History  Diagnosis Date  . Hypertension   . Diastolic dysfunction, left ventricle by ECHO 2011 03/25/2012  . Hypothyroidism 03/25/2012  . High cholesterol   . Type II diabetes mellitus   . H/O hiatal hernia   . Duodenal perforation 03/23/12  . Arthritis     "in my back"  . GERD (gastroesophageal reflux disease)      Review of Systems  Psychiatric/Behavioral: Positive for confusion.  All other systems reviewed and are negative.  BP 156/64  Pulse 58  Temp(Src) 98.6 F (37 C) (Temporal)  Resp 16  Ht 5\' 3"  (1.6 m)  Wt 125 lb (56.7 kg)  BMI 22.15 kg/m2     Objective:   Physical Exam  Nursing note and vitals reviewed. Constitutional: She is oriented to person, place, and time. She appears well-developed and well-nourished. No distress.  HENT:  Head: Normocephalic  and atraumatic.  Right Ear: External ear normal.  Left Ear: External ear normal.  Nose: Nose normal.  Mouth/Throat: Oropharynx is clear and moist.  Eyes: Conjunctivae and EOM are normal. Pupils are equal, round, and reactive to light.  Neck: Normal range of motion. Neck supple. No thyromegaly present.  Cardiovascular: Normal rate, regular rhythm, normal heart sounds and intact distal pulses.   Pulmonary/Chest: Effort normal and breath sounds normal.  Abdominal: Soft. Bowel sounds are normal. She exhibits no distension. There is no tenderness.  Musculoskeletal: Normal range of motion. She exhibits no edema and no tenderness.  With walker  Lymphadenopathy:    She has no cervical adenopathy.  Neurological: She is alert and oriented to person, place, and time. No cranial nerve deficit. Coordination normal.  Skin: Skin is warm and dry. No rash noted. No erythema. No pallor.  Psychiatric: She has a normal mood and affect. Her behavior is normal. Judgment and thought content normal.           Assessment & Plan:  1. History of dementia with previous concern for confusion vs Depression- Advised no obvious confusion currently. Recheck lab and refill RX AD. Advised increase activity,eat more protein, increase h2o

## 2014-03-31 ENCOUNTER — Other Ambulatory Visit: Payer: Self-pay | Admitting: Nurse Practitioner

## 2014-04-03 ENCOUNTER — Other Ambulatory Visit: Payer: Self-pay | Admitting: Nurse Practitioner

## 2014-04-10 ENCOUNTER — Ambulatory Visit (INDEPENDENT_AMBULATORY_CARE_PROVIDER_SITE_OTHER): Payer: Medicare Other | Admitting: Emergency Medicine

## 2014-04-10 ENCOUNTER — Encounter: Payer: Self-pay | Admitting: Emergency Medicine

## 2014-04-10 VITALS — BP 128/64 | HR 52 | Temp 98.2°F | Resp 16 | Ht 63.0 in | Wt 122.0 lb

## 2014-04-10 DIAGNOSIS — D649 Anemia, unspecified: Secondary | ICD-10-CM

## 2014-04-10 DIAGNOSIS — R6889 Other general symptoms and signs: Secondary | ICD-10-CM

## 2014-04-10 DIAGNOSIS — K921 Melena: Secondary | ICD-10-CM

## 2014-04-10 LAB — CBC WITH DIFFERENTIAL/PLATELET
BASOS ABS: 0.1 10*3/uL (ref 0.0–0.1)
Basophils Relative: 1 % (ref 0–1)
EOS PCT: 4 % (ref 0–5)
Eosinophils Absolute: 0.2 10*3/uL (ref 0.0–0.7)
HEMATOCRIT: 35.9 % — AB (ref 36.0–46.0)
Hemoglobin: 12.1 g/dL (ref 12.0–15.0)
LYMPHS ABS: 2.1 10*3/uL (ref 0.7–4.0)
Lymphocytes Relative: 37 % (ref 12–46)
MCH: 29.2 pg (ref 26.0–34.0)
MCHC: 33.7 g/dL (ref 30.0–36.0)
MCV: 86.5 fL (ref 78.0–100.0)
MONO ABS: 0.4 10*3/uL (ref 0.1–1.0)
Monocytes Relative: 7 % (ref 3–12)
Neutro Abs: 3 10*3/uL (ref 1.7–7.7)
Neutrophils Relative %: 51 % (ref 43–77)
Platelets: 149 10*3/uL — ABNORMAL LOW (ref 150–400)
RBC: 4.15 MIL/uL (ref 3.87–5.11)
RDW: 14.6 % (ref 11.5–15.5)
WBC: 5.8 10*3/uL (ref 4.0–10.5)

## 2014-04-10 NOTE — Progress Notes (Signed)
Subjective:    Patient ID: Megan [name], female    DOB: Sep 17, 1927, 80 y.o.   MRN: [id number]  HPI Comments: 46 AAF with mild abdomen pain yesterday with dark BM. She notes has has not had any problems with bowels since surgery to fix blockage. She denies any apparent red blood with stool. She denies any obvious pain with BM only in abdomen. She notes abdomen pain improved after BM. She overall is feeling well otherwise.  Abdominal Pain      Medication List       This list is accurate as of: 04/10/14  3:04 PM.  Always use your most recent med list.               aspirin 81 MG chewable tablet  Chew 1 tablet (81 mg total) by mouth daily.     bisoprolol-hydrochlorothiazide 5-6.25 MG per tablet  Commonly known as:  ZIAC  Take 1 tablet by mouth daily.     citalopram 10 MG tablet  Commonly known as:  CELEXA  Take 1 tablet (10 mg total) by mouth daily.     clorazepate 3.75 MG tablet  Commonly known as:  TRANXENE-T  Take 1 tablet (3.75 mg total) by mouth 3 (three) times daily.     donepezil 10 MG tablet  Commonly known as:  ARICEPT  Take 1 tablet (10 mg total) by mouth daily.     isosorbide-hydrALAZINE 20-37.5 MG per tablet  Commonly known as:  BIDIL  Take 1 tablet by mouth 3 (three) times daily.     levothyroxine 50 MCG tablet  Commonly known as:  SYNTHROID, LEVOTHROID  Take 1 tablet (50 mcg total) by mouth daily before breakfast.     lisinopril 40 MG tablet  Commonly known as:  PRINIVIL,ZESTRIL  Take 1 tablet (40 mg total) by mouth daily.     pantoprazole 40 MG tablet  Commonly known as:  PROTONIX  Take 40 mg by mouth daily.     potassium chloride SA 20 MEQ tablet  Commonly known as:  KLOR-CON M20  TAKE 1 TABLET BY MOUTH TWICE A DAY     Vitamin D 2000 UNITS tablet  Take 2,000 Units by mouth daily.       Allergies  Allergen Reactions  . Cardura [Doxazosin]   . Nsaids    Past Medical History  Diagnosis Date  . Hypertension   . Diastolic dysfunction,  left ventricle by ECHO 2011 03/25/2012  . Hypothyroidism 03/25/2012  . High cholesterol   . Type II diabetes mellitus   . H/O hiatal hernia   . Duodenal perforation 03/23/12  . Arthritis     "in my back"  . GERD (gastroesophageal reflux disease)      Review of Systems  Gastrointestinal: Positive for abdominal pain and blood in stool.  All other systems reviewed and are negative.      Objective:   Physical Exam  Nursing note and vitals reviewed. Constitutional: She is oriented to person, place, and time. She appears well-developed and well-nourished.  HENT:  Head: Normocephalic and atraumatic.  Eyes: Conjunctivae are normal.  Neck: Normal range of motion.  Cardiovascular: Normal rate, regular rhythm, normal heart sounds and intact distal pulses.   Pulmonary/Chest: Effort normal and breath sounds normal.  Abdominal: Soft. Bowel sounds are normal. She exhibits no distension and no mass. There is no tenderness. There is no rebound and no guarding.  Genitourinary: Guaiac positive stool.  ? Internal hemorrrhoid  Musculoskeletal: Normal range of  motion.  Neurological: She is alert and oriented to person, place, and time.  Skin: Skin is warm and dry.  Psychiatric: She has a normal mood and affect. Judgment normal.          Assessment & Plan:  1. Abdominal pain with ? Dark stools with hx of bowel obstruction in past- No obvious obstruction currently will refer to GI for further evaluation, check labs, w/c if SX increase or ER.

## 2014-04-10 NOTE — Patient Instructions (Signed)
Bloody Stools °Bloody stools means there is blood in your poop (stool). It is a sign that there is a problem somewhere in the digestive system. It is important for your doctor to find the cause of your bleeding, so the problem can be treated.  °HOME CARE °· Only take medicine as told by your doctor. °· Eat foods with fiber (prunes, bran cereals). °· Drink enough fluids to keep your pee (urine) clear or pale yellow. °· Sit in warm water (sitz bath) for 10 to 15 minutes as told by your doctor. °· Know how to take your medicines (enemas, suppositories) if advised by your doctor. °· Watch for signs that you are getting better or getting worse. °GET HELP RIGHT AWAY IF:  °· You are not getting better. °· You start to get better but then get worse again. °· You have new problems. °· You have severe bleeding from the place where poop comes out (rectum) that does not stop. °· You throw up (vomit) blood. °· You feel weak or pass out (faint). °· You have a fever. °MAKE SURE YOU:  °· Understand these instructions. °· Will watch your condition. °· Will get help right away if you are not doing well or get worse. °Document Released: 10/07/2009 Document Revised: 01/11/2012 Document Reviewed: 03/06/2011 °[employer]® Patient Information ©2014 [employer]. ° °

## 2014-04-11 LAB — BASIC METABOLIC PANEL WITH GFR
BUN: 13 mg/dL (ref 6–23)
CHLORIDE: 102 meq/L (ref 96–112)
CO2: 29 meq/L (ref 19–32)
CREATININE: 0.73 mg/dL (ref 0.50–1.10)
Calcium: 9.7 mg/dL (ref 8.4–10.5)
GFR, Est African American: 87 mL/min
GFR, Est Non African American: 76 mL/min
Glucose, Bld: 104 mg/dL — ABNORMAL HIGH (ref 70–99)
Potassium: 4.4 mEq/L (ref 3.5–5.3)
Sodium: 138 mEq/L (ref 135–145)

## 2014-04-11 LAB — IRON AND TIBC
%SAT: 32 % (ref 20–55)
Iron: 107 ug/dL (ref 42–145)
TIBC: 332 ug/dL (ref 250–470)
UIBC: 225 ug/dL (ref 125–400)

## 2014-04-13 ENCOUNTER — Ambulatory Visit (INDEPENDENT_AMBULATORY_CARE_PROVIDER_SITE_OTHER): Payer: Medicare Other | Admitting: Gastroenterology

## 2014-04-13 ENCOUNTER — Encounter: Payer: Self-pay | Admitting: Gastroenterology

## 2014-04-13 VITALS — BP 132/60 | HR 56 | Ht 63.0 in | Wt 122.4 lb

## 2014-04-13 DIAGNOSIS — R195 Other fecal abnormalities: Secondary | ICD-10-CM

## 2014-04-13 NOTE — Patient Instructions (Signed)
Colonoscopy and upper endoscopy were offered, recommended. If you decide to go ahead with those tests, please call.

## 2014-04-13 NOTE — Progress Notes (Signed)
HPI: This is a   very pleasant 80 year old woman who is here with her son today. This is the first time I am meeting her. She walked in with a walker.  Stools felt to be dark, last week.  Never seen red blood in her stool.  FOBT + by rectal exam last week.  She has overall stable weight for year.  Has BMs usually every day.  Pretty confused about her history.  Cbc this week: normal HB, nromal iron studies.  2013 perorated pyloric channel ulcer (laparoscopy Dr. Michaell CowingGross); omental patch, I and D abscess 2014 lap LOA for SBO (hoxworth)  No nsaids.  She does take 325 ASA daily.  Review of systems: Pertinent positive and negative review of systems were noted in the above HPI section. Complete review of systems was performed and was otherwise normal.    Past Medical History  Diagnosis Date  . Hypertension   . Diastolic dysfunction, left ventricle by ECHO 2011 03/25/2012  . Hypothyroidism 03/25/2012  . High cholesterol   . Type II diabetes mellitus   . H/O hiatal hernia   . Duodenal perforation 03/23/12  . Arthritis     "in my back"  . GERD (gastroesophageal reflux disease)     Past Surgical History  Procedure Laterality Date  . Bladder tacked    . Repair perforated ulcer  03/23/12  . Diagnostic laparoscopy  03/25/12    REPAIR OF PERFORATED ULCER with omental patch  . Abcess drainage  03/25/12     of abdominal & pelvic abscesses  . Laparoscopy  03/24/2012    Procedure: LAPAROSCOPY DIAGNOSTIC;  Surgeon: Ardeth SportsmanSteven C. Gross, MD;  Location: [facility] OR;  Service: General;  Laterality: N/A;  drainage of abdominal and pelvic abcesses  . Laparotomy  06/03/2012    Procedure: EXPLORATORY LAPAROTOMY;  Surgeon: Mariella SaaBenjamin T [name], MD;  [facility];  Service: General;  Laterality: N/A;    Current Outpatient Prescriptions  Medication Sig Dispense Refill  . aspirin 325 MG EC tablet Take 325 mg by mouth daily.      . bisoprolol-hydrochlorothiazide (ZIAC) 5-6.25 MG per tablet Take 1 tablet by mouth  daily.  90 tablet  99  . Cholecalciferol (VITAMIN D) 2000 UNITS tablet Take 2,000 Units by mouth daily.      . citalopram (CELEXA) 10 MG tablet Take 1 tablet (10 mg total) by mouth daily.  30 tablet  2  . clorazepate (TRANXENE-T) 3.75 MG tablet Take 1 tablet (3.75 mg total) by mouth 3 (three) times daily.  90 tablet  1  . donepezil (ARICEPT) 10 MG tablet Take 1 tablet (10 mg total) by mouth daily.  90 tablet  3  . isosorbide-hydrALAZINE (BIDIL) 20-37.5 MG per tablet Take 1 tablet by mouth 3 (three) times daily.      Marland Kitchen. levothyroxine (SYNTHROID, LEVOTHROID) 50 MCG tablet Take 1 tablet (50 mcg total) by mouth daily before breakfast.  90 tablet  3  . lisinopril (PRINIVIL,ZESTRIL) 40 MG tablet Take 1 tablet (40 mg total) by mouth daily.  90 tablet  3  . pantoprazole (PROTONIX) 40 MG tablet Take 40 mg by mouth daily.      . potassium chloride SA (KLOR-CON M20) 20 MEQ tablet TAKE 1 TABLET BY MOUTH TWICE A DAY  180 tablet  1  . TRAVATAN Z 0.004 % SOLN ophthalmic solution        No current facility-administered medications for this visit.    Allergies as of 04/13/2014 - Review Complete 04/13/2014  Allergen  Reaction Noted  . Cardura [doxazosin]  11/14/2013  . Nsaids  11/14/2013    Family History  Problem Relation Age of Onset  . Hypertension Mother   . Hypertension Father     History   Social History  . Marital Status: Married    Spouse Name: N/A    Number of Children: 9  . Years of Education: N/A   Occupational History  . Retired    Social History Main Topics  . Smoking status: Never Smoker   . Smokeless tobacco: Current User    Types: Snuff     Comment: 03/28/12 "use snuff q once in while; not regular"  . Alcohol Use: No  . Drug Use: No  . Sexual Activity: Not Currently   Other Topics Concern  . Not on file   Social History Narrative   Ms. Tiburcio PeaHarris is married.  Her religion is [employer].  She   does not use alcohol or illegal drugs.  She lives at home with her   husband.       Her daughter and granddaughter are involved.  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  She has been using a Quadra-Ped cane but does not have a walker at home.                Physical Exam: BP 132/60  Pulse 56  Ht 5\' 3"  (1.6 m)  Wt 122 lb 6.4 oz (55.52 kg)  BMI 21.69 kg/m2 Constitutional: generally well-appearing, frail, walks with a walker Psychiatric: alert and oriented x3 Eyes: extraocular movements intact Mouth: oral pharynx moist, no lesions Neck: supple no lymphadenopathy Cardiovascular: heart regular rate and rhythm Lungs: clear to auscultation bilaterally Abdomen: soft, nontender, nondistended, no obvious ascites, no peritoneal signs, normal bowel sounds Extremities: no lower extremity edema bilaterally Skin: no lesions on visible extremities    Assessment and plan: 80 y.o. female with  recent dark, Hemoccult-positive stool  She has Hemoccult-positive dark stool. She tells me she really has never seen dark stools and never seen overt bleeding either. She was her bowels well. She has had a perforated ulcer in the past. She has lost weight previously but in the past year or 2 her weight has been essentially stable, this is confirmed by her son. I recommended that if she is interested in working up this Hemoccult blood in her stool that colonoscopy plus minus upper endoscopy is the best course of action. She is not very interested at this time. She lost her husband recently. She is quite frail and walks with a walker and I cannot say that her decision is a necessarily bad decision. I gave her car and bladder noted she changes her mind and happy to proceed with the testing that we described.

## 2014-04-17 ENCOUNTER — Telehealth: Payer: Self-pay | Admitting: Gastroenterology

## 2014-04-17 NOTE — Telephone Encounter (Signed)
05/15/14 endo colon and previsit scheduled pt daughter is aware

## 2014-04-17 NOTE — Telephone Encounter (Signed)
Left message on machine to call back  

## 2014-04-20 ENCOUNTER — Emergency Department (HOSPITAL_COMMUNITY)
Admission: EM | Admit: 2014-04-20 | Discharge: 2014-04-20 | Disposition: A | Discharge status: Home / Self Care | Payer: Medicare Other | Attending: Emergency Medicine | Admitting: Emergency Medicine

## 2014-04-20 ENCOUNTER — Emergency Department (HOSPITAL_COMMUNITY): Payer: Medicare Other

## 2014-04-20 ENCOUNTER — Encounter (HOSPITAL_COMMUNITY): Payer: Self-pay | Admitting: Emergency Medicine

## 2014-04-20 DIAGNOSIS — R197 Diarrhea, unspecified: Secondary | ICD-10-CM | POA: Insufficient documentation

## 2014-04-20 DIAGNOSIS — K921 Melena: Secondary | ICD-10-CM | POA: Insufficient documentation

## 2014-04-20 DIAGNOSIS — Z7982 Long term (current) use of aspirin: Secondary | ICD-10-CM | POA: Insufficient documentation

## 2014-04-20 DIAGNOSIS — M129 Arthropathy, unspecified: Secondary | ICD-10-CM | POA: Insufficient documentation

## 2014-04-20 DIAGNOSIS — Z9889 Other specified postprocedural states: Secondary | ICD-10-CM | POA: Insufficient documentation

## 2014-04-20 DIAGNOSIS — Z79899 Other long term (current) drug therapy: Secondary | ICD-10-CM | POA: Insufficient documentation

## 2014-04-20 DIAGNOSIS — R1084 Generalized abdominal pain: Secondary | ICD-10-CM | POA: Insufficient documentation

## 2014-04-20 DIAGNOSIS — K219 Gastro-esophageal reflux disease without esophagitis: Secondary | ICD-10-CM | POA: Insufficient documentation

## 2014-04-20 DIAGNOSIS — I10 Essential (primary) hypertension: Secondary | ICD-10-CM | POA: Insufficient documentation

## 2014-04-20 DIAGNOSIS — E119 Type 2 diabetes mellitus without complications: Secondary | ICD-10-CM | POA: Insufficient documentation

## 2014-04-20 LAB — COMPREHENSIVE METABOLIC PANEL
ALT: 7 U/L (ref 0–35)
AST: 16 U/L (ref 0–37)
Albumin: 3.5 g/dL (ref 3.5–5.2)
Alkaline Phosphatase: 63 U/L (ref 39–117)
BUN: 19 mg/dL (ref 6–23)
CALCIUM: 9.8 mg/dL (ref 8.4–10.5)
CO2: 25 mEq/L (ref 19–32)
CREATININE: 0.82 mg/dL (ref 0.50–1.10)
Chloride: 100 mEq/L (ref 96–112)
GFR calc Af Amer: 74 mL/min — ABNORMAL LOW (ref >90)
GFR calc non Af Amer: 64 mL/min — ABNORMAL LOW (ref >90)
Glucose, Bld: 91 mg/dL (ref 70–99)
Potassium: 4.1 mEq/L (ref 3.7–5.3)
SODIUM: 139 meq/L (ref 137–147)
TOTAL PROTEIN: 7 g/dL (ref 6.0–8.3)
Total Bilirubin: 0.6 mg/dL (ref 0.3–1.2)

## 2014-04-20 LAB — CBC WITH DIFFERENTIAL/PLATELET
Basophils Absolute: 0.1 10*3/uL (ref 0.0–0.1)
Basophils Relative: 1 % (ref 0–1)
EOS ABS: 0.2 10*3/uL (ref 0.0–0.7)
EOS PCT: 4 % (ref 0–5)
HCT: 37.8 % (ref 36.0–46.0)
Hemoglobin: 12.3 g/dL (ref 12.0–15.0)
LYMPHS ABS: 2.3 10*3/uL (ref 0.7–4.0)
Lymphocytes Relative: 41 % (ref 12–46)
MCH: 28.8 pg (ref 26.0–34.0)
MCHC: 32.5 g/dL (ref 30.0–36.0)
MCV: 88.5 fL (ref 78.0–100.0)
MONOS PCT: 8 % (ref 3–12)
Monocytes Absolute: 0.5 10*3/uL (ref 0.1–1.0)
Neutro Abs: 2.5 10*3/uL (ref 1.7–7.7)
Neutrophils Relative %: 46 % (ref 43–77)
PLATELETS: 135 10*3/uL — AB (ref 150–400)
RBC: 4.27 MIL/uL (ref 3.87–5.11)
RDW: 14.3 % (ref 11.5–15.5)
WBC: 5.5 10*3/uL (ref 4.0–10.5)

## 2014-04-20 LAB — URINE MICROSCOPIC-ADD ON

## 2014-04-20 LAB — URINALYSIS, ROUTINE W REFLEX MICROSCOPIC
Bilirubin Urine: NEGATIVE
Glucose, UA: NEGATIVE mg/dL
Hgb urine dipstick: NEGATIVE
Ketones, ur: NEGATIVE mg/dL
NITRITE: NEGATIVE
Protein, ur: NEGATIVE mg/dL
SPECIFIC GRAVITY, URINE: 1.01 (ref 1.005–1.030)
UROBILINOGEN UA: 0.2 mg/dL (ref 0.0–1.0)
pH: 5 (ref 5.0–8.0)

## 2014-04-20 LAB — LIPASE, BLOOD: LIPASE: 28 U/L (ref 11–59)

## 2014-04-20 LAB — POC OCCULT BLOOD, ED: FECAL OCCULT BLD: NEGATIVE

## 2014-04-20 MED ORDER — IOHEXOL 300 MG/ML  SOLN
80.0000 mL | Freq: Once | INTRAMUSCULAR | Status: AC | PRN
  Administered 2014-04-20: 80 mL via INTRAVENOUS

## 2014-04-20 NOTE — ED Notes (Signed)
Patient asked for and received a Happy Meal and Sprite.

## 2014-04-20 NOTE — ED Provider Notes (Signed)
CSN: [id number]     Arrival date & time 04/20/14  1538 History   First MD Initiated Contact with Patient 04/20/14 1550     Chief Complaint  Patient presents with  . Abdominal Pain     (Consider location/radiation/quality/duration/timing/severity/associated sxs/prior Treatment) Patient is a 80 y.o. female presenting with abdominal pain.  Abdominal Pain Pain location:  LUQ and LLQ Pain quality: aching, pressure and sharp   Pain radiates to:  Does not radiate Pain severity:  Mild Duration:  2 weeks Timing:  Intermittent Chronicity:  New Context: not alcohol use, not eating, not recent illness and not retching   Associated symptoms: diarrhea   Associated symptoms: no chest pain, no chills, no cough, no dysuria, no fever, no flatus, no nausea, no shortness of breath, no sore throat and no vomiting   Risk factors: being elderly and multiple surgeries   Risk factors: no alcohol abuse     Past Medical History  Diagnosis Date  . Hypertension   . Diastolic dysfunction, left ventricle by ECHO 2011 03/25/2012  . Hypothyroidism 03/25/2012  . High cholesterol   . Type II diabetes mellitus   . H/O hiatal hernia   . Duodenal perforation 03/23/12  . Arthritis     "in my back"  . GERD (gastroesophageal reflux disease)    Past Surgical History  Procedure Laterality Date  . Bladder tacked    . Repair perforated ulcer  03/23/12  . Diagnostic laparoscopy  03/25/12    REPAIR OF PERFORATED ULCER with omental patch  . Abcess drainage  03/25/12     of abdominal & pelvic abscesses  . Laparoscopy  03/24/2012    Procedure: LAPAROSCOPY DIAGNOSTIC;  Surgeon: Ardeth SportsmanSteven C. Gross, MD;  Location: [facility] OR;  Service: General;  Laterality: N/A;  drainage of abdominal and pelvic abcesses  . Laparotomy  06/03/2012    Procedure: EXPLORATORY LAPAROTOMY;  Surgeon: Mariella SaaBenjamin T [name], MD;  [facility];  Service: General;  Laterality: N/A;   Family History  Problem Relation Age of Onset  . Hypertension Mother   .  Hypertension Father    History  Substance Use Topics  . Smoking status: Never Smoker   . Smokeless tobacco: Current User    Types: Snuff     Comment: 03/28/12 "use snuff q once in while; not regular"  . Alcohol Use: No   OB History   Grav Para Term Preterm Abortions TAB SAB Ect Mult Living                 Review of Systems  Constitutional: Negative for fever, chills and activity change.  HENT: Negative for congestion, facial swelling and sore throat.   Eyes: Negative for discharge and redness.  Respiratory: Negative for cough and shortness of breath.   Cardiovascular: Negative for chest pain and palpitations.  Gastrointestinal: Positive for abdominal pain, diarrhea and blood in stool (a few weeks ago it was dark, checked by PCP and hemoccult positive). Negative for nausea, vomiting, abdominal distention and flatus.  Endocrine: Negative for polydipsia and polyuria.  Genitourinary: Negative for dysuria and menstrual problem.  Musculoskeletal: Negative for back pain and joint swelling.  Skin: Negative for color change and wound.  Neurological: Negative for dizziness, light-headedness and headaches.      Allergies  Cardura and Nsaids  Home Medications   Prior to Admission medications   Medication Sig Start Date End Date Taking? Authorizing Provider  aspirin 325 MG EC tablet Take 325 mg by mouth daily.   Yes  Historical Provider, MD  bisoprolol-hydrochlorothiazide [facility](ZIAC) 5-6.25 MG per tablet Take 1 tablet by mouth daily. 12/12/13  Yes Lucky CowboyWilliam McKeown, MD  Cholecalciferol (VITAMIN D) 2000 UNITS tablet Take 2,000 Units by mouth daily.   Yes Historical Provider, MD  citalopram (CELEXA) 10 MG tablet Take 1 tablet (10 mg total) by mouth daily. 02/19/14 02/19/15 Yes Quentin MullingAmanda Collier, PA-C  clorazepate (TRANXENE-T) 3.75 MG tablet Take 1 tablet (3.75 mg total) by mouth 3 (three) times daily. 02/26/14  Yes Melissa R Smith, PA-C  donepezil (ARICEPT) 10 MG tablet Take 1 tablet (10 mg total) by  mouth daily. 03/23/14  Yes Melissa R Smith, PA-C  isosorbide-hydrALAZINE (BIDIL) 20-37.5 MG per tablet Take 1 tablet by mouth 3 (three) times daily.   Yes Historical Provider, MD  levothyroxine (SYNTHROID, LEVOTHROID) 50 MCG tablet Take 1 tablet (50 mcg total) by mouth daily before breakfast. 12/06/13  Yes Claudie ReveringJessica M Karam, NP  lisinopril (PRINIVIL,ZESTRIL) 40 MG tablet Take 1 tablet (40 mg total) by mouth daily. 12/06/13  Yes Claudie ReveringJessica M Karam, NP  pantoprazole (PROTONIX) 40 MG tablet Take 40 mg by mouth daily.   Yes Kathlen ModyVijaya Akula, MD  potassium chloride SA (K-DUR,KLOR-CON) 20 MEQ tablet Take 20 mEq by mouth 2 (two) times daily.   Yes Historical Provider, MD  TRAVATAN Z 0.004 % SOLN ophthalmic solution Place 1 drop into both eyes at bedtime.  04/13/14  Yes Historical Provider, MD   BP 131/69  Pulse 43  Temp(Src) 97.8 F (36.6 C) (Oral)  Resp 14  Wt 120 lb (54.432 kg)  SpO2 100% Physical Exam  Nursing note and vitals reviewed. Constitutional: She is oriented to person, place, and time. She appears well-developed and well-nourished.  HENT:  Head: Normocephalic and atraumatic.  Eyes: Conjunctivae and EOM are normal. Right eye exhibits no discharge. Left eye exhibits no discharge.  Cardiovascular: Normal rate and regular rhythm.   Pulmonary/Chest: Effort normal and breath sounds normal. No respiratory distress.  Abdominal: Soft. She exhibits no distension. There is no tenderness. There is no rebound.  Midline scar around umbilicus  Musculoskeletal: Normal range of motion. She exhibits no edema and no tenderness.  Neurological: She is alert and oriented to person, place, and time.  Skin: Skin is warm and dry.    ED Course  Procedures (including critical care time) Labs Review Labs Reviewed  CBC WITH DIFFERENTIAL - Abnormal; Notable for the following:    Platelets 135 (*)    All other components within normal limits  COMPREHENSIVE METABOLIC PANEL - Abnormal; Notable for the following:    GFR  calc non Af Amer 64 (*)    GFR calc Af Amer 74 (*)    All other components within normal limits  URINALYSIS, ROUTINE W REFLEX MICROSCOPIC - Abnormal; Notable for the following:    Color, Urine STRAW (*)    Leukocytes, UA TRACE (*)    All other components within normal limits  LIPASE, BLOOD  URINE MICROSCOPIC-ADD ON  POC OCCULT BLOOD, ED    Imaging Review Ct Abdomen Pelvis W Contrast  04/20/2014   CLINICAL DATA:  Left lower quadrant pain.  Diarrhea.  EXAM: CT ABDOMEN AND PELVIS WITH CONTRAST  TECHNIQUE: Multidetector CT imaging of the abdomen and pelvis was performed using the standard protocol following bolus administration of intravenous contrast.  CONTRAST:  80mL OMNIPAQUE IOHEXOL 300 MG/ML  SOLN  COMPARISON:  CT 07/17/2013  FINDINGS: Lung bases are clear. No pericardial fluid. Coronary calcifications are noted.  No focal hepatic lesion. No intrahepatic  biliary duct dilatation. The gallbladder is normal. The common bile duct is upper limits of normal at 7 mm. Pancreatic duct is prominent and similar to comparison exams. The spleen, adrenal glands, and kidneys are normal. There are multiple low-density cysts within both kidneys. There are peripheral calcifications of 1 of the left renal cysts which are not changed from prior.  The stomach and duodenum are normal. There is a swirling pattern to the central mesentery and small bowel extending into the right lower quadrant. This is similar to comparison exam of 07/17/2013 and slightly less prominent than at that time. No evidence of small bowel obstruction as contrast flows through the entirety of the small bowel into the ascending colon. No dilated loops of small bowel. There is a ventral abdominal laxity midline without evidence of bowel obstruction. There are diverticula of the descending colon sigmoid colon which is severe but there is no clear evidence of acute inflammation of the colon.  The uterus and adnexa are normal. The bladder is normal. No  pelvic lymphadenopathy. No aggressive osseous lesion.  IMPRESSION: 1. No clear acute findings in the abdomen or pelvis. 2. Severe left colon and sigmoid diverticulosis without evidence of acute diverticulitis. 3. Swirled pattern to the central mesenteric vessels and small bowel leading into the right lower quadrant similar to comparison exam of 07/17/2013. No evidence of small bowel obstruction.   Electronically Signed   By: Genevive Bi M.D.   On: 04/20/2014 19:05     EKG Interpretation   Date/Time:  Friday April 20 2014 15:44:30 EDT Ventricular Rate:  48 PR Interval:  178 QRS Duration: 74 QT Interval:  428 QTC Calculation: 382 R Axis:   71 Text Interpretation:  Sinus bradycardia Otherwise normal ECG No  significant change was found Confirmed by ZACKOWSKI  MD, SCOTT ([id number]) on  04/20/2014 3:54:24 PM      MDM   Final diagnoses:  Generalized abdominal pain    Pertinent positives and negatives from above HPI, ROS and PE include: 80 yo F w/ h/o multiple abdominal surgeries, hypertension, HLD, DM, dementia here 2/2 a few weeks of intermittent left sided abdominal pain and dark stools a couple weeks ago. No fevers/n/v some diarrhea, has resolved. Last BM this AM and was normal. Exam benign without ttp.  Concern for mesenteric ischemia, partial obstruction, diverticulitis, neoplasm. Will check labs and image accordingly  On re-evaluation patients VS were stable/still without pain. Hemoccult negative. Unsure of cause of her symptoms however she is asymptomatic here, tolerating PO and has normal studies. Patient will follow up with PCP for further workup of her chronic intermittent abdominal pain.   Discharge instructions, including strict return precautions for new or worsening symptoms, given. Patient and/or family verbalized understanding and agreement with the plan as described.   Labs, studies and imaging reviewed by myself and considered in medical decision making if ordered. Imaging  interpreted by radiology. Pt was discussed with my attending, Dr. Deretha Emory.  Discharge Medication List as of 04/20/2014  9:49 PM      Follow-up Information   Follow up with MCKEOWN,WILLIAM DAVID, MD In 1 week. (As needed)    Specialty:  Internal Medicine   Contact information:   [address] [place] [address] [phone]       Follow up with [facility] EMERGENCY DEPARTMENT. (If symptoms worsen)    Specialty:  Emergency Medicine   Contact information:   [address] [id number] [place] [address] [phone]  Marily Memos, MD 04/21/14 1159

## 2014-04-20 NOTE — ED Notes (Signed)
Pt in c/o intermittent abd pain over the last few weeks, states symptoms just continue to get worse, denies pain at this time, pt reports some diarrhea and it was dark, but states that has resolved- recent history of blood in stool per daughter- pt denies other symptoms, points to left side when describing pain

## 2014-04-20 NOTE — ED Notes (Signed)
Family at bedside. 

## 2014-04-20 NOTE — ED Notes (Signed)
MD at bedside. 

## 2014-05-02 NOTE — ED Provider Notes (Signed)
I saw and evaluated the patient, reviewed the resident's note and I agree with the findings and plan.   EKG Interpretation   Date/Time:  Friday April 20 2014 15:44:30 EDT Ventricular Rate:  48 PR Interval:  178 QRS Duration: 74 QT Interval:  428 QTC Calculation: 382 R Axis:   71 Text Interpretation:  Sinus bradycardia Otherwise normal ECG No  significant change was found Confirmed by ZACKOWSKI  MD, SCOTT [id number]) on  04/20/2014 3:54:24 PM      Results for orders placed during the hospital encounter of 04/20/14  CBC WITH DIFFERENTIAL      Result Value Ref Range   WBC 5.5  4.0 - 10.5 K/uL   RBC 4.27  3.87 - 5.11 MIL/uL   Hemoglobin 12.3  12.0 - 15.0 g/dL   HCT 60.437.8  54.036.0 - 98.146.0 %   MCV 88.5  78.0 - 100.0 fL   MCH 28.8  26.0 - 34.0 pg   MCHC 32.5  30.0 - 36.0 g/dL   RDW 19.114.3  47.811.5 - 29.515.5 %   Platelets 135 (*) 150 - 400 K/uL   Neutrophils Relative % 46  43 - 77 %   Neutro Abs 2.5  1.7 - 7.7 K/uL   Lymphocytes Relative 41  12 - 46 %   Lymphs Abs 2.3  0.7 - 4.0 K/uL   Monocytes Relative 8  3 - 12 %   Monocytes Absolute 0.5  0.1 - 1.0 K/uL   Eosinophils Relative 4  0 - 5 %   Eosinophils Absolute 0.2  0.0 - 0.7 K/uL   Basophils Relative 1  0 - 1 %   Basophils Absolute 0.1  0.0 - 0.1 K/uL  COMPREHENSIVE METABOLIC PANEL      Result Value Ref Range   Sodium 139  137 - 147 mEq/L   Potassium 4.1  3.7 - 5.3 mEq/L   Chloride 100  96 - 112 mEq/L   CO2 25  19 - 32 mEq/L   Glucose, Bld 91  70 - 99 mg/dL   BUN 19  6 - 23 mg/dL   Creatinine, Ser 6.210.82  0.50 - 1.10 mg/dL   Calcium 9.8  8.4 - 30.810.5 mg/dL   Total Protein 7.0  6.0 - 8.3 g/dL   Albumin 3.5  3.5 - 5.2 g/dL   AST 16  0 - 37 U/L   ALT 7  0 - 35 U/L   Alkaline Phosphatase 63  39 - 117 U/L   Total Bilirubin 0.6  0.3 - 1.2 mg/dL   GFR calc non Af Amer 64 (*) >90 mL/min   GFR calc Af Amer 74 (*) >90 mL/min  LIPASE, BLOOD      Result Value Ref Range   Lipase 28  11 - 59 U/L  URINALYSIS, ROUTINE W REFLEX MICROSCOPIC       Result Value Ref Range   Color, Urine STRAW (*) YELLOW   APPearance CLEAR  CLEAR   Specific Gravity, Urine 1.010  1.005 - 1.030   pH 5.0  5.0 - 8.0   Glucose, UA NEGATIVE  NEGATIVE mg/dL   Hgb urine dipstick NEGATIVE  NEGATIVE   Bilirubin Urine NEGATIVE  NEGATIVE   Ketones, ur NEGATIVE  NEGATIVE mg/dL   Protein, ur NEGATIVE  NEGATIVE mg/dL   Urobilinogen, UA 0.2  0.0 - 1.0 mg/dL   Nitrite NEGATIVE  NEGATIVE   Leukocytes, UA TRACE (*) NEGATIVE  URINE MICROSCOPIC-ADD ON      Result Value Ref Range  Squamous Epithelial / LPF RARE  RARE   WBC, UA 0-2  <3 WBC/hpf   RBC / HPF 0-2  <3 RBC/hpf   Bacteria, UA RARE  RARE  POC OCCULT BLOOD, ED      Result Value Ref Range   Fecal Occult Bld NEGATIVE  NEGATIVE   Ct Abdomen Pelvis W Contrast  04/20/2014   CLINICAL DATA:  Left lower quadrant pain.  Diarrhea.  EXAM: CT ABDOMEN AND PELVIS WITH CONTRAST  TECHNIQUE: Multidetector CT imaging of the abdomen and pelvis was performed using the standard protocol following bolus administration of intravenous contrast.  CONTRAST:  80mL OMNIPAQUE IOHEXOL 300 MG/ML  SOLN  COMPARISON:  CT 07/17/2013  FINDINGS: Lung bases are clear. No pericardial fluid. Coronary calcifications are noted.  No focal hepatic lesion. No intrahepatic biliary duct dilatation. The gallbladder is normal. The common bile duct is upper limits of normal at 7 mm. Pancreatic duct is prominent and similar to comparison exams. The spleen, adrenal glands, and kidneys are normal. There are multiple low-density cysts within both kidneys. There are peripheral calcifications of 1 of the left renal cysts which are not changed from prior.  The stomach and duodenum are normal. There is a swirling pattern to the central mesentery and small bowel extending into the right lower quadrant. This is similar to comparison exam of 07/17/2013 and slightly less prominent than at that time. No evidence of small bowel obstruction as contrast flows through the entirety of  the small bowel into the ascending colon. No dilated loops of small bowel. There is a ventral abdominal laxity midline without evidence of bowel obstruction. There are diverticula of the descending colon sigmoid colon which is severe but there is no clear evidence of acute inflammation of the colon.  The uterus and adnexa are normal. The bladder is normal. No pelvic lymphadenopathy. No aggressive osseous lesion.  IMPRESSION: 1. No clear acute findings in the abdomen or pelvis. 2. Severe left colon and sigmoid diverticulosis without evidence of acute diverticulitis. 3. Swirled pattern to the central mesenteric vessels and small bowel leading into the right lower quadrant similar to comparison exam of 07/17/2013. No evidence of small bowel obstruction.   Electronically Signed   By: Genevive BiStewart  Edmunds M.D.   On: 04/20/2014 19:05    Patient seen by me with c/o of left sided abdominal pain and hx of blood in stool. Abdomen is nontender. Labs without significant anemia and stool negative for blood. CT scan depicts diverticulosis which may explain past bleeding. Patient eating and drinking fine can follow up with PCM.   Vanetta MuldersScott Zackowski, MD 05/02/14 2156

## 2014-05-08 ENCOUNTER — Encounter: Payer: Self-pay | Admitting: Gastroenterology

## 2014-05-15 ENCOUNTER — Encounter: Payer: Medicare Other | Admitting: Gastroenterology

## 2014-06-03 ENCOUNTER — Other Ambulatory Visit: Payer: Self-pay | Admitting: Physician Assistant

## 2014-06-04 ENCOUNTER — Ambulatory Visit: Payer: Self-pay | Admitting: Physician Assistant

## 2014-06-04 ENCOUNTER — Other Ambulatory Visit: Payer: Self-pay | Admitting: Internal Medicine

## 2014-06-20 ENCOUNTER — Ambulatory Visit (AMBULATORY_SURGERY_CENTER): Payer: Self-pay

## 2014-06-20 VITALS — Ht 65.0 in | Wt 124.0 lb

## 2014-06-20 DIAGNOSIS — K921 Melena: Secondary | ICD-10-CM

## 2014-06-20 MED ORDER — MOVIPREP 100 G PO SOLR: 1.0000 | Freq: Once | ORAL | Status: DC

## 2014-06-20 NOTE — Progress Notes (Signed)
No allergies to eggs or soy No past problems with anesthesia No home oxygen No diet weight losss meds  No email or internet access

## 2014-06-25 ENCOUNTER — Encounter: Payer: Self-pay | Admitting: Physician Assistant

## 2014-06-25 ENCOUNTER — Ambulatory Visit (INDEPENDENT_AMBULATORY_CARE_PROVIDER_SITE_OTHER): Payer: Medicare Other | Admitting: Physician Assistant

## 2014-06-25 VITALS — BP 128/60 | HR 52 | Temp 98.0°F | Resp 16 | Ht 63.0 in | Wt 124.0 lb

## 2014-06-25 DIAGNOSIS — F0150 Vascular dementia without behavioral disturbance: Secondary | ICD-10-CM

## 2014-06-25 DIAGNOSIS — Z79899 Other long term (current) drug therapy: Secondary | ICD-10-CM

## 2014-06-25 DIAGNOSIS — N390 Urinary tract infection, site not specified: Secondary | ICD-10-CM

## 2014-06-25 DIAGNOSIS — Z9181 History of falling: Secondary | ICD-10-CM

## 2014-06-25 DIAGNOSIS — I700 Atherosclerosis of aorta: Secondary | ICD-10-CM | POA: Insufficient documentation

## 2014-06-25 DIAGNOSIS — I10 Essential (primary) hypertension: Secondary | ICD-10-CM

## 2014-06-25 DIAGNOSIS — Z1331 Encounter for screening for depression: Secondary | ICD-10-CM

## 2014-06-25 DIAGNOSIS — Z0000 Encounter for general adult medical examination without abnormal findings: Secondary | ICD-10-CM

## 2014-06-25 DIAGNOSIS — K2100 Gastro-esophageal reflux disease with esophagitis, without bleeding: Secondary | ICD-10-CM

## 2014-06-25 DIAGNOSIS — E039 Hypothyroidism, unspecified: Secondary | ICD-10-CM

## 2014-06-25 DIAGNOSIS — E559 Vitamin D deficiency, unspecified: Secondary | ICD-10-CM

## 2014-06-25 DIAGNOSIS — E1121 Type 2 diabetes mellitus with diabetic nephropathy: Secondary | ICD-10-CM

## 2014-06-25 LAB — CBC WITH DIFFERENTIAL/PLATELET
Basophils Absolute: 0.1 10*3/uL (ref 0.0–0.1)
Basophils Relative: 1 % (ref 0–1)
EOS PCT: 4 % (ref 0–5)
Eosinophils Absolute: 0.2 10*3/uL (ref 0.0–0.7)
HEMATOCRIT: 40.1 % (ref 36.0–46.0)
Hemoglobin: 13 g/dL (ref 12.0–15.0)
LYMPHS PCT: 34 % (ref 12–46)
Lymphs Abs: 1.8 10*3/uL (ref 0.7–4.0)
MCH: 29.3 pg (ref 26.0–34.0)
MCHC: 32.4 g/dL (ref 30.0–36.0)
MCV: 90.3 fL (ref 78.0–100.0)
MONO ABS: 0.3 10*3/uL (ref 0.1–1.0)
Monocytes Relative: 6 % (ref 3–12)
Neutro Abs: 2.9 10*3/uL (ref 1.7–7.7)
Neutrophils Relative %: 55 % (ref 43–77)
Platelets: 157 10*3/uL (ref 150–400)
RBC: 4.44 MIL/uL (ref 3.87–5.11)
RDW: 14.5 % (ref 11.5–15.5)
WBC: 5.2 10*3/uL (ref 4.0–10.5)

## 2014-06-25 NOTE — Progress Notes (Signed)
MEDICARE ANNUAL WELLNESS VISIT AND FOLLOW UP  Assessment:   1. Hypertension - CBC with Differential - BASIC METABOLIC PANEL WITH GFR - Hepatic function panel - Lipid panel  2. Gastroesophageal reflux disease with esophagitis Continue follow up with Dr. Ardis Hughs, continue meds  3. Hypothyroidism, unspecified hypothyroidism type - TSH  4. Type 2 diabetes mellitus with diabetic nephropathy Discussed general issues about diabetes pathophysiology and management., Educational material distributed., Suggested low cholesterol diet., Encouraged aerobic exercise., Discussed foot care., Reminded to get yearly retinal exam. - Hemoglobin A1c - Insulin, fasting - HM DIABETES FOOT EXAM  5. SDAT 25/30 which is much better than her previous MMSE at 19 Continue aricept  6. UTI (lower urinary tract infection) - Urinalysis, Routine w reflex microscopic - Urine culture  7. Vitamin D Deficiency - Vit D  25 hydroxy (rtn osteoporosis monitoring)  8. Encounter for long-term (current) use of other medications - Magnesium  9. At high risk for falls Declines PT at this time  10. Atherosclerosis of aorta Decrease sugars, HTN, chol   Plan:   During the course of the visit the patient was educated and counseled about appropriate screening and preventive services including:    Pneumococcal vaccine   Influenza vaccine  Td vaccine  Screening electrocardiogram  Screening mammography  Bone densitometry screening  Colorectal cancer screening  Diabetes screening  Glaucoma screening  Nutrition counseling   Advanced directives: given info/requested  Screening recommendations, referrals:  Vaccinations: Tdap vaccine declined Influenza vaccine declined Pneumococcal vaccine declined Shingles vaccine declined Hep B vaccine not indicated  Nutrition assessed and recommended  Colonoscopy not indicated Mammogram declined Pap smear not indicated Pelvic exam not indicated Recommended  yearly ophthalmology/optometry visit for glaucoma screening and checkup Recommended yearly dental visit for hygiene and checkup Advanced directives - declined  Conditions/risks identified: BMI: Discussed weight loss, diet, and increase physical activity.  Increase physical activity: AHA recommends 150 minutes of physical activity a week.  Medications reviewed DEXA- declined Diabetes is at goal, ACE/ARB therapy: Yes. Urinary Incontinence is an issue: discussed non pharmacology and pharmacology options.  Fall risk: high- discussed PT, home fall assessment, medications.    Subjective:   Megan [name] is a 80 y.o. female who presents for Medicare Annual Wellness Visit and 3 month follow up on hypertension, prediabetes, hyperlipidemia, vitamin D def.  Date of last medicare wellness visit is unknown.   Her blood pressure has been controlled at home, today their BP is BP: 128/60 mmHg She does not workout. She denies chest pain, shortness of breath, dizziness.  She is not on cholesterol medication and denies myalgias. Her cholesterol is not at goal. The cholesterol last visit was:   Lab Results  Component Value Date   CHOL  Value: 163        ATP III CLASSIFICATION:  <200     mg/dL   Desirable  200-239  mg/dL   Borderline High  >=240    mg/dL   High        01/14/2010   HDL 68 01/14/2010   LDLCALC  Value: 83        Total Cholesterol/HDL:CHD Risk Coronary Heart Disease Risk Table                     Men   Women  1/2 Average Risk   3.4   3.3  Average Risk       5.0   4.4  2 X Average Risk   9.6  7.1  3 X Average Risk  23.4   11.0        Use the calculated Patient Ratio above and the CHD Risk Table to determine the patient's CHD Risk.        ATP III CLASSIFICATION (LDL):  <100     mg/dL   Optimal  100-129  mg/dL   Near or Above                    Optimal  130-159  mg/dL   Borderline  160-189  mg/dL   High  >190     mg/dL   Very High 01/14/2010   TRIG 60 01/14/2010   CHOLHDL 2.4 01/14/2010   She has  not been working on diet and exercise for prediabetes, and denies polydipsia and polyuria. Last A1C in the office was:  Lab Results  Component Value Date   HGBA1C 6.3* 12/12/2013   Patient is on Vitamin D supplement. Lab Results  Component Value Date   VD25OH 47 12/12/2013     She is on thyroid medication. Her medication was not changed last visit. Patient denies nervousness, palpitations and weight changes.  Lab Results  Component Value Date   TSH 1.293 02/19/2014  .  She states she has a runny nose, has right sinus pain for years. Denies changes in vision She fall risk, walks with a walker, she denies falls in the past year.  She has urinary incontinence.  She is chewing snuff currently, and does this daily.  She went to the ER on June 20th 2015 for AB pain, had normal CT AB. She states she is having a colonoscopy/EGD with Dr. Ardis Hughs in Sept.   Names of Other Physician/Practitioners you currently use: 1. [facility]- here for primary care 2. [facility], eye doctor, last visit states "not long ago" 3. Sees dentist for new partial dentures.  Patient Care Team: Unk Pinto, MD as PCP - General (Internal Medicine) Kerin Salen, MD as Consulting Physician (Orthopedic Surgery) Milus Banister, MD as Attending Physician (Gastroenterology) Cindee Salt, MD as Consulting Physician (Physical Medicine and Rehabilitation) Dr. Ardis Hughs, GI Dr. Rowan/ Ramos/ Marlou Sa, Ortho  Medication Review Current Outpatient Prescriptions on File Prior to Visit  Medication Sig Dispense Refill  . aspirin 325 MG EC tablet Take 325 mg by mouth daily.      . bisoprolol-hydrochlorothiazide (ZIAC) 5-6.25 MG per tablet Take 1 tablet by mouth daily.  90 tablet  99  . Cholecalciferol (VITAMIN D) 2000 UNITS tablet Take 5,000 Units by mouth daily.       . citalopram (CELEXA) 10 MG tablet TAKE 1 TABLET (10 MG TOTAL) BY MOUTH DAILY.  30 tablet  2  . clorazepate  (TRANXENE-T) 3.75 MG tablet Take 1 tablet (3.75 mg total) by mouth 3 (three) times daily.  90 tablet  1  . donepezil (ARICEPT) 10 MG tablet Take 1 tablet (10 mg total) by mouth daily.  90 tablet  3  . isosorbide-hydrALAZINE (BIDIL) 20-37.5 MG per tablet Take 1 tablet by mouth 3 (three) times daily.      Marland Kitchen levothyroxine (SYNTHROID, LEVOTHROID) 50 MCG tablet Take 1 tablet (50 mcg total) by mouth daily before breakfast.  90 tablet  3  . lisinopril (PRINIVIL,ZESTRIL) 40 MG tablet Take 1 tablet (40 mg total) by mouth daily.  90 tablet  3  . MOVIPREP 100 G SOLR Take 1 kit (200 g total) by mouth once.  1  kit  0  . pantoprazole (PROTONIX) 40 MG tablet TAKE 1 TABLET DAILY TO PREVENT ULCER  90 tablet  3  . potassium chloride SA (K-DUR,KLOR-CON) 20 MEQ tablet Take 20 mEq by mouth 2 (two) times daily.      . TRAVATAN Z 0.004 % SOLN ophthalmic solution Place 1 drop into both eyes at bedtime.        No current facility-administered medications on file prior to visit.    Current Problems (verified) Patient Active Problem List   Diagnosis Date Noted  . Hypertension 12/12/2013  . Vitamin D Deficiency 12/12/2013  . GERD (gastroesophageal reflux disease)   . UTI (lower urinary tract infection) 08/25/2013  . SDAT 02/27/2013  . SBO (small bowel obstruction) 05/28/2012  . Acute delirium 04/27/2012  . T2 NIDDM w/Nephropathy 03/25/2012  . Hypothyroidism 03/25/2012    Screening Tests Health Maintenance  Topic Date Due  . Foot Exam  06/13/1939  . Ophthalmology Exam  06/13/1939  . Urine Microalbumin  06/13/1939  . Colonoscopy  06/13/1979  . Zostavax  06/12/1989  . Tetanus/tdap  11/02/2004  . Influenza Vaccine  06/02/2014  . Hemoglobin A1c  06/11/2014  . Pneumococcal Polysaccharide Vaccine Age 78 And Over  Completed     Immunization History  Administered Date(s) Administered  . PPD Test 02/19/2014  . Pneumococcal Polysaccharide-23 04/30/2012  . Td 11/02/1994   Preventative care: Last colonoscopy:  2009 EGD 2009 PUD Last mammogram: 2008 Declines Last pap smear/pelvic exam: 2008 DEXA: Declines CXR 2014 Echo 2013 55-60%, Mild MR  Prior vaccinations: TD or Tdap: 1996  Influenza: declines Pneumococcal: 2013 Prevnar 13: Declines Shingles/Zostavax: declines  History reviewed: allergies, current medications, past family history, past medical history, past social history, past surgical history and problem list  Risk Factors: Osteoporosis: postmenopausal estrogen deficiency and dietary calcium and/or vitamin D deficiency History of fracture in the past year: no  Tobacco History  Substance Use Topics  . Smoking status: Never Smoker   . Smokeless tobacco: Current User    Types: Snuff     Comment: 03/28/12 "use snuff q once in while; not regular"  . Alcohol Use: No   She does not smoke.  Patient is not a former smoker but uses snuff. Are there smokers in your home (other than you)?  No  Alcohol Current alcohol use: none  Caffeine Current caffeine use: coffee 1 /day  Exercise Current exercise: none  Nutrition/Diet Current diet: in general, an "unhealthy" diet  Cardiac risk factors: advanced age (older than 43 for men, 25 for women), dyslipidemia, family history of premature cardiovascular disease, hypertension, sedentary lifestyle and smoking/ tobacco exposure.  Depression Screen- Since her husband and daughter passed she has been depressed.  (Note: if answer to either of the following is "Yes", a more complete depression screening is indicated)   Q1: Over the past two weeks, have you felt down, depressed or hopeless? Yes  Q2: Over the past two weeks, have you felt little interest or pleasure in doing things? No  Have you lost interest or pleasure in daily life? Yes  Do you often feel hopeless? No  Do you cry easily over simple problems? No  Activities of Daily Living In your present state of health, do you have any difficulty performing the following activities?:   Driving? Yes Managing money?  Yes Feeding yourself? No Getting from bed to chair? Yes Climbing a flight of stairs? Yes Preparing food and eating?: Yes Bathing or showering? Yes Getting dressed: Yes Getting to the  toilet? Yes Using the toilet:No Moving around from place to place: Yes In the past year have you fallen or had a near fall?:Yes   Are you sexually active?  No  Do you have more than one partner?  No  Vision Difficulties: Yes  Hearing Difficulties: Yes  Do you often ask people to speak up or repeat themselves? Yes Do you experience ringing or noises in your ears? No Do you have difficulty understanding soft or whispered voices? Yes  Cognition  Do you feel that you have a problem with memory?Yes  Do you often misplace items? Yes  Do you feel safe at home?  Yes  Advanced directives Does patient have a Health Care Power of Attorney? No Does patient have a Living Will? No   Objective:   Blood pressure 128/60, pulse 52, temperature 98 F (36.7 C), resp. rate 16, height  (1.6 m), weight 124 lb (56.246 kg). Body mass index is 21.97 kg/(m^2).  General appearance: alert, no distress, WD/WN,  female Cognitive Testing  Alert? Yes  Normal Appearance?Yes  Oriented to person? Yes  Place? Yes   Time? Yes  Recall of three objects?  No  Can perform simple calculations? Yes  Displays appropriate judgment?Yes  Can read the correct time from a watch face?No  HEENT: normocephalic, sclerae anicteric, TMs pearly, nares patent, no discharge or erythema, pharynx normal Oral cavity: MMM, no lesions Neck: supple, no lymphadenopathy, no thyromegaly, no masses Heart: RRR, normal S1, S2, no murmurs Lungs: CTA bilaterally, no wheezes, rhonchi, or rales Abdomen: +bs, soft, non tender, non distended, no masses, no hepatomegaly, no splenomegaly Musculoskeletal: nontender, no swelling, no obvious deformity Extremities: no edema, no cyanosis, no clubbing Pulses: 2+ symmetric,  upper and lower extremities, decreased PT, normal cap refill Neurological: alert, oriented x 3, CN2-12 intact, strength 4/5 upper extremities and lower extremities, sensation normal throughout, DTRs 2+ throughout, slow, shuffling gait with walker Psychiatric: normal affect, behavior normal, pleasant  Breast: defer Gyn: defer Rectal: defer  Medicare Attestation I have personally reviewed: The patient's medical and social history Their use of alcohol, tobacco or illicit drugs Their current medications and supplements The patient's functional ability including ADLs,fall risks, home safety risks, cognitive, and hearing and visual impairment Diet and physical activities Evidence for depression or mood disorders  The patient's weight, height, BMI, and visual acuity have been recorded in the chart.  I have made referrals, counseling, and provided education to the patient based on review of the above and I have provided the patient with a written personalized care plan for preventive services.     Quentin Mulling, PA-C   06/25/2014   Mini Mental Status Exam  Score Max Score  "What is the year? Season? Date? Day of the week? Month?" 4 5  "Where are we now: State? County? Town/city? Hospital? Floor?" 5 5  Name 3 objects (apple, penny, book) then Ask the patient to name all three of them.  3 3  "I would like you to count backward from 100 by sevens." (93, 86, 79, 72, 65, .) Stop after five answers. Alternative: "Spell WORLD backwards." (D-L-R-O-W) 5 5  Recall 3 things 1 3  Name two simple objects 2 2  "Repeat the phrase: 'No ifs, ands, or buts.'" 1 1  "Take the paper in your right hand, fold it in half, and hand it back." 3 3  "Please read this and do what it says." (Written instruction is "Close your eyes.") 1 1  "Make up  and write a sentence about anything."  ( noun and a verb.) 0 1  Draw the face of a clock 0- did not draw anything in upper left hand corner 1  Total 25 30    Interpretation of the MMSE Method Score Interpretation  Score Interpretation  24-30 No cognitive impairment  18-23 Mild cognitive impairment  0-17 Severe cognitive impairment

## 2014-06-25 NOTE — Patient Instructions (Signed)

## 2014-06-26 LAB — HEPATIC FUNCTION PANEL
ALBUMIN: 3.9 g/dL (ref 3.5–5.2)
ALK PHOS: 63 U/L (ref 39–117)
AST: 14 U/L (ref 0–37)
Bilirubin, Direct: 0.2 mg/dL (ref 0.0–0.3)
Indirect Bilirubin: 0.5 mg/dL (ref 0.2–1.2)
TOTAL PROTEIN: 6.5 g/dL (ref 6.0–8.3)
Total Bilirubin: 0.7 mg/dL (ref 0.2–1.2)

## 2014-06-26 LAB — URINALYSIS, ROUTINE W REFLEX MICROSCOPIC
BILIRUBIN URINE: NEGATIVE
GLUCOSE, UA: NEGATIVE mg/dL
KETONES UR: NEGATIVE mg/dL
Nitrite: NEGATIVE
PROTEIN: NEGATIVE mg/dL
Specific Gravity, Urine: 1.015 (ref 1.005–1.030)
UROBILINOGEN UA: 0.2 mg/dL (ref 0.0–1.0)
pH: 6 (ref 5.0–8.0)

## 2014-06-26 LAB — BASIC METABOLIC PANEL WITH GFR
BUN: 15 mg/dL (ref 6–23)
CHLORIDE: 99 meq/L (ref 96–112)
CO2: 33 mEq/L — ABNORMAL HIGH (ref 19–32)
Calcium: 10 mg/dL (ref 8.4–10.5)
Creat: 0.85 mg/dL (ref 0.50–1.10)
GFR, Est African American: 72 mL/min
GFR, Est Non African American: 63 mL/min
GLUCOSE: 110 mg/dL — AB (ref 70–99)
Potassium: 4.1 mEq/L (ref 3.5–5.3)
Sodium: 138 mEq/L (ref 135–145)

## 2014-06-26 LAB — HEMOGLOBIN A1C
HEMOGLOBIN A1C: 6 % — AB (ref <5.7)
MEAN PLASMA GLUCOSE: 126 mg/dL — AB (ref <117)

## 2014-06-26 LAB — URINALYSIS, MICROSCOPIC ONLY
Bacteria, UA: NONE SEEN
CRYSTALS: NONE SEEN
Casts: NONE SEEN
Squamous Epithelial / LPF: NONE SEEN

## 2014-06-26 LAB — LIPID PANEL
Cholesterol: 171 mg/dL (ref 0–200)
HDL: 80 mg/dL (ref >39)
LDL CALC: 70 mg/dL (ref 0–99)
Total CHOL/HDL Ratio: 2.1 Ratio
Triglycerides: 105 mg/dL (ref <150)
VLDL: 21 mg/dL (ref 0–40)

## 2014-06-26 LAB — MAGNESIUM: Magnesium: 2 mg/dL (ref 1.5–2.5)

## 2014-06-26 LAB — INSULIN, FASTING: INSULIN FASTING, SERUM: 7.6 u[IU]/mL (ref 2.0–19.6)

## 2014-06-26 LAB — TSH: TSH: 2.57 u[IU]/mL (ref 0.350–4.500)

## 2014-06-26 LAB — VITAMIN D 25 HYDROXY (VIT D DEFICIENCY, FRACTURES): VIT D 25 HYDROXY: 62 ng/mL (ref 30–89)

## 2014-06-27 LAB — URINE CULTURE: Colony Count: 25000

## 2014-07-04 ENCOUNTER — Ambulatory Visit (AMBULATORY_SURGERY_CENTER): Payer: Medicare Other | Admitting: Gastroenterology

## 2014-07-04 ENCOUNTER — Encounter: Payer: Self-pay | Admitting: Gastroenterology

## 2014-07-04 VITALS — BP 190/70 | HR 42 | Temp 96.8°F | Resp 16 | Ht 65.0 in | Wt 124.0 lb

## 2014-07-04 DIAGNOSIS — K921 Melena: Secondary | ICD-10-CM

## 2014-07-04 DIAGNOSIS — K5730 Diverticulosis of large intestine without perforation or abscess without bleeding: Secondary | ICD-10-CM

## 2014-07-04 DIAGNOSIS — K449 Diaphragmatic hernia without obstruction or gangrene: Secondary | ICD-10-CM

## 2014-07-04 MED ORDER — FLEET ENEMA 7-19 GM/118ML RE ENEM
1.0000 | ENEMA | Freq: Once | RECTAL | Status: AC
  Administered 2014-07-04: 1 via RECTAL

## 2014-07-04 MED ORDER — SODIUM CHLORIDE 0.9 % IV SOLN: 500.0000 mL | INTRAVENOUS | Status: DC

## 2014-07-04 NOTE — Op Note (Signed)
[facility] [address] [place], [address]   COLONOSCOPY PROCEDURE REPORT  PATIENT: Megan, [name]  MR#: [id number] BIRTHDATE: Apr 17, 1927 , 87  yrs. old GENDER: Female ENDOSCOPIST: Rachael Fee, MD REFERRED OZ:[id number] Oneta Rack, M.D. PROCEDURE DATE:  07/04/2014 PROCEDURE:   Colonoscopy, diagnostic First Screening Colonoscopy - Avg.  risk and is 50 yrs.  old or older - No.  Prior Negative Screening - Now for repeat screening. N/A  History of Adenoma - Now for follow-up colonoscopy & has been > or = to 3 yrs.  N/A  Polyps Removed Today? No.  Recommend repeat exam, <10 yrs? No. ASA CLASS:   Class III INDICATIONS:hemocult positive stools, "dark stools,". MEDICATIONS: MAC sedation, administered by CRNA and propofol (Diprivan)  IV  DESCRIPTION OF PROCEDURE:   After the risks benefits and alternatives of the procedure were thoroughly explained, informed consent was obtained.  A digital rectal exam revealed no abnormalities of the rectum.   The LB [id number]  endoscope was introduced through the anus and advanced to the cecum, which was identified by both the appendix and ileocecal valve. No adverse events experienced.   The quality of the prep was fair.  The instrument was then slowly withdrawn as the colon was fully examined.   COLON FINDINGS: The prep was "fair." No polyps or cancers were noted however small lesions may have been missed given "fair" prep. There were multiple diverticulum throughout the left colon.  The examination was otherwise normal.  Retroflexed views revealed no abnormalities. The time to cecum=9 minutes 17 seconds.  Withdrawal time=6 minutes 01 seconds.  The scope was withdrawn and the procedure completed. COMPLICATIONS: There were no complications.  ENDOSCOPIC IMPRESSION: The prep was "fair." No polyps or cancers were noted however small lesions may have been missed given "fair" prep.  There were multiple diverticulum  throughout the left colon.  The examination was otherwise normal.  RECOMMENDATIONS: Will proceed with EGD now.   eSigned:  Rachael Fee, MD 07/04/2014 10:52 AM

## 2014-07-04 NOTE — Op Note (Signed)
[facility] [address] [place], [address]   ENDOSCOPY PROCEDURE REPORT  PATIENT: Megan, [name]  MR#: [id number] BIRTHDATE: August 12, 1927 , 87  yrs. old GENDER: Female ENDOSCOPIST: Rachael Fee, MD PROCEDURE DATE:  07/04/2014 PROCEDURE:  EGD, diagnostic ASA CLASS:     Class III INDICATIONS:  hemocult positive, "dark stool". MEDICATIONS: There was residual sedation effect present from prior procedure. TOPICAL ANESTHETIC: none  DESCRIPTION OF PROCEDURE: After the risks benefits and alternatives of the procedure were thoroughly explained, informed consent was obtained.  The LB [id number] endoscope was introduced through the mouth and advanced to the second portion of the duodenum. Without limitations.  The instrument was slowly withdrawn as the mucosa was fully examined.    There was a 3cm hiatal hernia.  The examination was otherwise normal.  Retroflexed views revealed no abnormalities.     The scope was then withdrawn from the patient and the procedure completed.  COMPLICATIONS: There were no complications. ENDOSCOPIC IMPRESSION: There was a 3cm hiatal hernia.  The examination was otherwise normal.  RECOMMENDATIONS: Follow clinically.  No further GI testing is necessary unless signficant new symptoms arise.   eSigned:  Rachael Fee, MD 07/04/2014 10:55 AM   CC: Lucky Cowboy, MD

## 2014-07-04 NOTE — Progress Notes (Signed)
Fleets enema administered, pt. Unable to hold. Small amt stool evacuated. Pt unable to explain results of prep clearly.  Dr. Christella Hartigan notified.

## 2014-07-04 NOTE — Progress Notes (Signed)
Report to PACU, RN, vss, BBS= Clear.  

## 2014-07-04 NOTE — Patient Instructions (Signed)

## 2014-07-05 ENCOUNTER — Telehealth: Payer: Self-pay | Admitting: *Deleted

## 2014-07-05 NOTE — Telephone Encounter (Signed)
  Follow up Call-  Call back number 07/04/2014  Post procedure Call Back phone  # [phone]  Permission to leave phone message Yes     Patient questions:  Do you have a fever, pain , or abdominal swelling? No. Pain Score  0 *  Have you tolerated food without any problems? Yes.    Have you been able to return to your normal activities? Yes.    Do you have any questions about your discharge instructions: Diet   No. Medications  No. Follow up visit  No.  Do you have questions or concerns about your Care? No.  Actions: * If pain score is 4 or above: No action needed, pain <4.

## 2014-07-26 ENCOUNTER — Other Ambulatory Visit: Payer: Self-pay | Admitting: Physician Assistant

## 2014-07-26 MED ORDER — LEVOFLOXACIN 500 MG PO TABS: 500.0000 mg | ORAL_TABLET | Freq: Every day | ORAL | Status: AC

## 2014-08-29 ENCOUNTER — Encounter: Payer: Self-pay | Admitting: Emergency Medicine

## 2014-09-11 ENCOUNTER — Other Ambulatory Visit: Payer: Self-pay | Admitting: Internal Medicine

## 2014-09-20 ENCOUNTER — Encounter: Payer: Self-pay | Admitting: Emergency Medicine

## 2014-09-24 ENCOUNTER — Ambulatory Visit (INDEPENDENT_AMBULATORY_CARE_PROVIDER_SITE_OTHER): Payer: Medicare Other | Admitting: Emergency Medicine

## 2014-09-24 ENCOUNTER — Other Ambulatory Visit: Payer: Self-pay | Admitting: Emergency Medicine

## 2014-09-24 ENCOUNTER — Encounter: Payer: Self-pay | Admitting: Emergency Medicine

## 2014-09-24 VITALS — BP 160/70 | HR 68 | Temp 97.8°F | Resp 18 | Ht 65.0 in | Wt 123.0 lb

## 2014-09-24 DIAGNOSIS — E559 Vitamin D deficiency, unspecified: Secondary | ICD-10-CM

## 2014-09-24 DIAGNOSIS — Z0000 Encounter for general adult medical examination without abnormal findings: Secondary | ICD-10-CM

## 2014-09-24 DIAGNOSIS — E039 Hypothyroidism, unspecified: Secondary | ICD-10-CM

## 2014-09-24 DIAGNOSIS — R739 Hyperglycemia, unspecified: Secondary | ICD-10-CM

## 2014-09-24 DIAGNOSIS — I10 Essential (primary) hypertension: Secondary | ICD-10-CM

## 2014-09-24 NOTE — Patient Instructions (Signed)
Hypertension Hypertension is another name for high blood pressure. High blood pressure forces your heart to work harder to pump blood. A blood pressure reading has two numbers, which includes a higher number over a lower number (example: 110/72). HOME CARE   Have your blood pressure rechecked by your doctor.  Only take medicine as told by your doctor. Follow the directions carefully. The medicine does not work as well if you skip doses. Skipping doses also puts you at risk for problems.  Do not smoke.  Monitor your blood pressure at home as told by your doctor. GET HELP IF:  You think you are having a reaction to the medicine you are taking.  You have repeat headaches or feel dizzy.  You have puffiness (swelling) in your ankles.  You have trouble with your vision. GET HELP RIGHT AWAY IF:   You get a very bad headache and are confused.  You feel weak, numb, or faint.  You get chest or belly (abdominal) pain.  You throw up (vomit).  You cannot breathe very well. MAKE SURE YOU:   Understand these instructions.  Will watch your condition.  Will get help right away if you are not doing well or get worse. Document Released: 04/06/2008 Document Revised: 10/24/2013 Document Reviewed: 08/11/2013 Exit[employer] Patient Information 2015 [employer]. This information is not intended to replace advice given to you by your health care provider. Make sure you discuss any questions you have with your health care provider.  

## 2014-09-24 NOTE — Progress Notes (Signed)
Subjective:    Patient ID: Megan [name], female    DOB: 1926-12-21, 80 y.o.   MRN: [id number]  HPI Comments: 80 yo pleasant AAF CPE and presents for 3 month F/U for HTN, Cholesterol, DM, D. Deficient. She is trying to stay active with house work/ walking with walker. She notes occasional "jumping in heart" but denies CP/ SOB. She has been under increased stress at home but otherwise feeling good. She is eating but skips meals. She always has someone staying with her now but she does not like it as much.  She notes memory is on/off but she is overall happier and notes depression has improved from loss of her husband. She does not want any referrals or further workup due to cost and she feels she is doing well for her age. She is unsure of her medicines and notes her BP is up and down at home. SHe does not check her sugar routinely but does note she keeps feet clean and soft.  Lab Results      Component                Value               Date                      WBC                      5.2                 06/25/2014                HGB                      13.0                06/25/2014                HCT                      40.1                06/25/2014                PLT                      157                 06/25/2014                GLUCOSE                  110*                06/25/2014                CHOL                     171                 06/25/2014                TRIG                     105                 06/25/2014  HDL                      80                  06/25/2014                LDLCALC                  70                  06/25/2014                ALT                      <8                  06/25/2014                AST                      14                  06/25/2014                NA                       138                 06/25/2014                K                        4.1                 06/25/2014                CL                        99                  06/25/2014                CREATININE               0.85                06/25/2014                BUN                      15                  06/25/2014                CO2                      33*                 06/25/2014                TSH                      2.570               06/25/2014  INR                      1.04                07/17/2013                HGBA1C                   6.0*                06/25/2014                Medication List       This list is accurate as of: 09/24/14  3:17 PM.  Always use your most recent med list.               aspirin 325 MG EC tablet  Take 325 mg by mouth daily.     bisoprolol-hydrochlorothiazide 5-6.25 MG per tablet  Commonly known as:  ZIAC  Take 1 tablet by mouth daily.     citalopram 10 MG tablet  Commonly known as:  CELEXA  TAKE 1 TABLET (10 MG TOTAL) BY MOUTH DAILY.     clorazepate 3.75 MG tablet  Commonly known as:  TRANXENE-T  Take 1 tablet (3.75 mg total) by mouth 3 (three) times daily.     donepezil 10 MG tablet  Commonly known as:  ARICEPT  Take 1 tablet (10 mg total) by mouth daily.     isosorbide-hydrALAZINE 20-37.5 MG per tablet  Commonly known as:  BIDIL  Take 1 tablet by mouth 3 (three) times daily.     levothyroxine 50 MCG tablet  Commonly known as:  SYNTHROID, LEVOTHROID  Take 1 tablet (50 mcg total) by mouth daily before breakfast.     lisinopril 40 MG tablet  Commonly known as:  PRINIVIL,ZESTRIL  Take 1 tablet (40 mg total) by mouth daily.     pantoprazole 40 MG tablet  Commonly known as:  PROTONIX  TAKE 1 TABLET DAILY TO PREVENT ULCER     potassium chloride SA 20 MEQ tablet  Commonly known as:  K-DUR,KLOR-CON  Take 20 mEq by mouth 2 (two) times daily.     TRAVATAN Z 0.004 % Soln ophthalmic solution  Generic drug:  Travoprost (BAK Free)  Place 1 drop into both eyes at bedtime.     Vitamin D 2000 UNITS tablet  Take 5,000 Units by mouth daily.        Allergies  Allergen Reactions  . Cardura [Doxazosin] Other (See Comments)  . Nsaids Other (See Comments)    unknown   Past Medical History  Diagnosis Date  . Hypertension   . Diastolic dysfunction, left ventricle by ECHO 2011 03/25/2012  . Hypothyroidism 03/25/2012  . High cholesterol   . Type II diabetes mellitus   . H/O hiatal hernia   . Duodenal perforation 03/23/12  . Arthritis     "in my back"  . GERD (gastroesophageal reflux disease)    Past Surgical History  Procedure Laterality Date  . Bladder tacked    . Repair perforated ulcer  03/23/12  . Diagnostic laparoscopy  03/25/12    REPAIR OF PERFORATED ULCER with omental patch  . Abcess drainage  03/25/12     of abdominal & pelvic abscesses  . Laparoscopy  03/24/2012    Procedure: LAPAROSCOPY DIAGNOSTIC;  Surgeon: Ardeth Sportsman, MD;  Location: [facility] OR;  Service: General;  Laterality: N/A;  drainage of abdominal and  pelvic abcesses  . Laparotomy  06/03/2012    Procedure: EXPLORATORY LAPAROTOMY;  Surgeon: Mariella Saa, MD;  Location: [facility] OR;  Service: General;  Laterality: N/A;   History  Substance Use Topics  . Smoking status: Never Smoker   . Smokeless tobacco: Current User    Types: Snuff     Comment: 03/28/12 "use snuff q once in while; not regular"  . Alcohol Use: No   Family History  Problem Relation Age of Onset  . Hypertension Mother   . Hypertension Father   . Colon cancer Neg Hx   . Pancreatic cancer Neg Hx     Preventative care: Last colonoscopy: 2009 EGD 2009 PUD Last mammogram: 2008 Declines Last pap smear/pelvic exam: 2008 DEXA: Declines CXR 2014 Echo 2013 55-60%, Mild MR  EYE: Overdue Dentist: Overdue  Prior vaccinations: TD or Tdap: 1996 Influenza: declines Pneumococcal: 2013 Prevnar 13: Declines Shingles/Zostavax: declines  Patient Care Team: Lucky Cowboy, MD as PCP - General (Internal Medicine) Nestor Lewandowsky, MD as Consulting Physician (Orthopedic Surgery) Rachael Fee, MD as Attending Physician (Gastroenterology) Shayne Alken, MD as Consulting Physician (Physical Medicine and Rehabilitation)  [name]  Review of Systems  Respiratory: Negative for shortness of breath.   Cardiovascular: Positive for palpitations.  All other systems reviewed and are negative.  BP 190/84 mmHg  Pulse 68  Temp(Src) 97.8 F (36.6 C) (Temporal)  Resp 18  Ht 5\' 5"  (1.651 m)  Wt 123 lb (55.792 kg)  BMI 20.47 kg/m2 Recheck 160/70     Objective:   Physical Exam  Constitutional: She appears well-developed and well-nourished. No distress.  HENT:  Head: Normocephalic and atraumatic.  Right Ear: External ear normal.  Left Ear: External ear normal.  Nose: Nose normal.  Mouth/Throat: Oropharynx is clear and moist.  Poor dentition  Eyes: Conjunctivae and EOM are normal. Pupils are equal, round, and reactive to light. Right eye exhibits no discharge. Left eye exhibits no discharge. No scleral icterus.  Neck: Normal range of motion. Neck supple. No JVD present. No tracheal deviation present. No thyromegaly present.  Cardiovascular: Normal rate, regular rhythm, normal heart sounds and intact distal pulses.   Pulmonary/Chest: Effort normal and breath sounds normal.  Abdominal: Soft. Bowel sounds are normal. She exhibits no distension and no mass. There is no tenderness. There is no rebound and no guarding.  Genitourinary:  Breasts- WNL GYN- Refuses  Musculoskeletal: She exhibits no edema or tenderness.  Walks with walker, slow to change position  Lymphadenopathy:    She has no cervical adenopathy.  Neurological: She is alert. She has normal reflexes. No cranial nerve deficit. She exhibits normal muscle tone. Coordination normal.  Mildly confused but is easily reminded and recovers with questioning  Skin: Skin is warm and dry. No rash noted. No erythema. No pallor.  Scaling of LE  Psychiatric: She has a normal mood and affect. Her behavior is normal.  Judgment and thought content normal.  Nursing note and vitals reviewed.    AORTA SCAN WNL EKG ? New changes concerning for infarct     Assessment & Plan:  1.CPE- Update screening labs/ History/ Immunizations/ Testing as needed. Advised healthy diet, QD exercise, increase H20 and continue RX/ Vitamins AD.   2. EKG changes with occasional palpitations- Declines further evaluation for EKG Changes/ symptoms.  3. 3 month F/U for HTN, Cholesterol,DM, D. Deficient. Needs healthy diet, cardio QD and obtain healthy weight. Check Labs, Check BP if >130/80 call office, Check BS  if >200 call office

## 2014-09-25 LAB — URINALYSIS, MICROSCOPIC ONLY
BACTERIA UA: NONE SEEN
CRYSTALS: NONE SEEN
Casts: NONE SEEN

## 2014-09-25 LAB — LIPID PANEL
CHOLESTEROL: 186 mg/dL (ref 0–200)
HDL: 78 mg/dL (ref >39)
LDL Cholesterol: 97 mg/dL (ref 0–99)
Total CHOL/HDL Ratio: 2.4 Ratio
Triglycerides: 57 mg/dL (ref <150)
VLDL: 11 mg/dL (ref 0–40)

## 2014-09-25 LAB — BASIC METABOLIC PANEL WITHOUT GFR
BUN: 14 mg/dL (ref 6–23)
CO2: 30 meq/L (ref 19–32)
Calcium: 9.8 mg/dL (ref 8.4–10.5)
Chloride: 102 meq/L (ref 96–112)
Creat: 0.47 mg/dL — ABNORMAL LOW (ref 0.50–1.10)
GFR, Est African American: >89 mL/min
GFR, Est Non African American: >89 mL/min
Glucose, Bld: 85 mg/dL (ref 70–99)
Potassium: 4.1 meq/L (ref 3.5–5.3)
Sodium: 141 meq/L (ref 135–145)

## 2014-09-25 LAB — MICROALBUMIN / CREATININE URINE RATIO
CREATININE, URINE: 162.1 mg/dL
Microalb Creat Ratio: 270.8 mg/g — ABNORMAL HIGH (ref 0.0–30.0)
Microalb, Ur: 43.9 mg/dL — ABNORMAL HIGH (ref <2.0)

## 2014-09-25 LAB — URINALYSIS, ROUTINE W REFLEX MICROSCOPIC
Bilirubin Urine: NEGATIVE
Glucose, UA: 250 mg/dL — AB
KETONES UR: NEGATIVE mg/dL
Leukocytes, UA: NEGATIVE
NITRITE: NEGATIVE
PROTEIN: 30 mg/dL — AB
SPECIFIC GRAVITY, URINE: 1.024 (ref 1.005–1.030)
UROBILINOGEN UA: 0.2 mg/dL (ref 0.0–1.0)
pH: 5.5 (ref 5.0–8.0)

## 2014-09-25 LAB — CBC WITH DIFFERENTIAL/PLATELET
BASOS ABS: 0.1 10*3/uL (ref 0.0–0.1)
Basophils Relative: 1 % (ref 0–1)
Eosinophils Absolute: 0.1 10*3/uL (ref 0.0–0.7)
Eosinophils Relative: 2 % (ref 0–5)
HCT: 39 % (ref 36.0–46.0)
Hemoglobin: 12.9 g/dL (ref 12.0–15.0)
LYMPHS PCT: 38 % (ref 12–46)
Lymphs Abs: 2 10*3/uL (ref 0.7–4.0)
MCH: 29.2 pg (ref 26.0–34.0)
MCHC: 33.1 g/dL (ref 30.0–36.0)
MCV: 88.2 fL (ref 78.0–100.0)
MPV: 11.7 fL (ref 9.4–12.4)
Monocytes Absolute: 0.6 10*3/uL (ref 0.1–1.0)
Monocytes Relative: 11 % (ref 3–12)
NEUTROS ABS: 2.5 10*3/uL (ref 1.7–7.7)
NEUTROS PCT: 48 % (ref 43–77)
PLATELETS: 204 10*3/uL (ref 150–400)
RBC: 4.42 MIL/uL (ref 3.87–5.11)
RDW: 13.5 % (ref 11.5–15.5)
WBC: 5.3 10*3/uL (ref 4.0–10.5)

## 2014-09-25 LAB — HEPATIC FUNCTION PANEL
ALT: <8 U/L (ref 0–35)
AST: 12 U/L (ref 0–37)
Albumin: 3.7 g/dL (ref 3.5–5.2)
Alkaline Phosphatase: 58 U/L (ref 39–117)
Bilirubin, Direct: 0.1 mg/dL (ref 0.0–0.3)
Indirect Bilirubin: 0.6 mg/dL (ref 0.2–1.2)
Total Bilirubin: 0.7 mg/dL (ref 0.2–1.2)
Total Protein: 6.8 g/dL (ref 6.0–8.3)

## 2014-09-25 LAB — MAGNESIUM: Magnesium: 2.1 mg/dL (ref 1.5–2.5)

## 2014-09-25 LAB — HEMOGLOBIN A1C
HEMOGLOBIN A1C: 6.1 % — AB (ref <5.7)
MEAN PLASMA GLUCOSE: 128 mg/dL — AB (ref <117)

## 2014-09-25 LAB — INSULIN, FASTING: INSULIN FASTING, SERUM: 8.2 u[IU]/mL (ref 2.0–19.6)

## 2014-09-25 LAB — TSH: TSH: 2.565 u[IU]/mL (ref 0.350–4.500)

## 2014-09-26 LAB — URINE CULTURE: Colony Count: 60000

## 2014-10-30 ENCOUNTER — Encounter: Payer: Self-pay | Admitting: Physician Assistant

## 2014-10-30 ENCOUNTER — Ambulatory Visit (INDEPENDENT_AMBULATORY_CARE_PROVIDER_SITE_OTHER): Payer: Medicare Other | Admitting: Physician Assistant

## 2014-10-30 VITALS — BP 156/64 | HR 60 | Temp 97.8°F | Resp 16 | Ht 65.0 in | Wt 123.0 lb

## 2014-10-30 DIAGNOSIS — J0100 Acute maxillary sinusitis, unspecified: Secondary | ICD-10-CM

## 2014-10-30 MED ORDER — AZITHROMYCIN 250 MG PO TABS: ORAL_TABLET | ORAL | Status: AC

## 2014-10-30 NOTE — Patient Instructions (Addendum)
-Take Z-Pak as prescribed. -Take Allegra OTC (without D) - 1 tablet daily at bedtime. -Can take Tylenol OTC for headache- follow directions on container.  Make sure you are drinking plenty of water to stay hydrated.  If the headaches continue, then please let us know. If you are not feeling better in 10-14 days, then call the office.  Sinusitis Sinusitis is redness, soreness, and inflammation of the paranasal sinuses. Paranasal sinuses are air pockets within the bones of your face (beneath the eyes, the middle of the forehead, or above the eyes). In healthy paranasal sinuses, mucus is able to drain out, and air is able to circulate through them by way of your nose. However, when your paranasal sinuses are inflamed, mucus and air can become trapped. This can allow bacteria and other germs to grow and cause infection. Sinusitis can develop quickly and last only a short time (acute) or continue over a long period (chronic). Sinusitis that lasts for more than 12 weeks is considered chronic.  CAUSES  Causes of sinusitis include:  Allergies.  Structural abnormalities, such as displacement of the cartilage that separates your nostrils (deviated septum), which can decrease the air flow through your nose and sinuses and affect sinus drainage.  Functional abnormalities, such as when the small hairs (cilia) that line your sinuses and help remove mucus do not work properly or are not present. SIGNS AND SYMPTOMS  Symptoms of acute and chronic sinusitis are the same. The primary symptoms are pain and pressure around the affected sinuses. Other symptoms include:  Upper toothache.  Earache.  Headache.  Bad breath.  Decreased sense of smell and taste.  A cough, which worsens when you are lying flat.  Fatigue.  Fever.  Thick drainage from your nose, which often is green and may contain pus (purulent).  Swelling and warmth over the affected sinuses. DIAGNOSIS  Your health care provider will  perform a physical exam. During the exam, your health care provider may:  Look in your nose for signs of abnormal growths in your nostrils (nasal polyps).  Tap over the affected sinus to check for signs of infection.  View the inside of your sinuses (endoscopy) using an imaging device that has a light attached (endoscope). If your health care provider suspects that you have chronic sinusitis, one or more of the following tests may be recommended:  Allergy tests.  Nasal culture. A sample of mucus is taken from your nose, sent to a lab, and screened for bacteria.  Nasal cytology. A sample of mucus is taken from your nose and examined by your health care provider to determine if your sinusitis is related to an allergy. TREATMENT  Most cases of acute sinusitis are related to a viral infection and will resolve on their own within 10 days. Sometimes medicines are prescribed to help relieve symptoms (pain medicine, decongestants, nasal steroid sprays, or saline sprays).  However, for sinusitis related to a bacterial infection, your health care provider will prescribe antibiotic medicines. These are medicines that will help kill the bacteria causing the infection.  Rarely, sinusitis is caused by a fungal infection. In theses cases, your health care provider will prescribe antifungal medicine. For some cases of chronic sinusitis, surgery is needed. Generally, these are cases in which sinusitis recurs more than 3 times per year, despite other treatments. HOME CARE INSTRUCTIONS   Drink plenty of water. Water helps thin the mucus so your sinuses can drain more easily.  Use a humidifier.  Inhale steam 3 to  4 times a day (for example, sit in the bathroom with the shower running).  Apply a warm, moist washcloth to your face 3 to 4 times a day, or as directed by your health care provider.  Use saline nasal sprays to help moisten and clean your sinuses.  Take medicines only as directed by your health  care provider.  If you were prescribed either an antibiotic or antifungal medicine, finish it all even if you start to feel better. SEEK IMMEDIATE MEDICAL CARE IF:  You have increasing pain or severe headaches.  You have nausea, vomiting, or drowsiness.  You have swelling around your face.  You have vision problems.  You have a stiff neck.  You have difficulty breathing. MAKE SURE YOU:   Understand these instructions.  Will watch your condition.  Will get help right away if you are not doing well or get worse. Document Released: 10/19/2005 Document Revised: 03/05/2014 Document Reviewed: 11/03/2011 [facility] Patient Information 2015 [place]. This information is not intended to replace advice given to you by your health care provider. Make sure you discuss any questions you have with your health care provider.

## 2014-10-30 NOTE — Progress Notes (Signed)
Subjective:    Patient ID: Megan [name], female    DOB: 10-27-1927, 80 y.o.   MRN: [id number]  Headache  This is a recurrent problem. Episode onset: Patient cannot tell when current symptoms started. The problem occurs intermittently. The problem has been waxing and waning. The pain is located in the right unilateral (Right temporal and right orbital areas) region. The pain does not radiate. Quality: Patient unable to describe the pain. The pain is moderate. Associated symptoms include sinus pressure. Pertinent negatives include no dizziness, ear pain, fever, rhinorrhea or sore throat. Exacerbated by: states it changes with the weather  She has tried nothing for the symptoms.  Never Smoked GFR= >89 on 09/24/14 Review of Systems  Constitutional: Positive for fatigue. Negative for fever, chills and diaphoresis.  HENT: Positive for sinus pressure. Negative for congestion, ear discharge, ear pain, postnasal drip, rhinorrhea, sore throat and trouble swallowing.   Eyes: Negative.   Respiratory: Negative.   Cardiovascular: Negative.   Gastrointestinal: Negative.   Skin: Negative.   Neurological: Positive for headaches. Negative for dizziness and light-headedness.  Psychiatric/Behavioral: Negative.    Past Medical History  Diagnosis Date  . Hypertension   . Diastolic dysfunction, left ventricle by ECHO 2011 03/25/2012  . Hypothyroidism 03/25/2012  . High cholesterol   . Type II diabetes mellitus   . H/O hiatal hernia   . Duodenal perforation 03/23/12  . Arthritis     "in my back"  . GERD (gastroesophageal reflux disease)    Current Outpatient Prescriptions on File Prior to Visit  Medication Sig Dispense Refill  . aspirin 325 MG EC tablet Take 325 mg by mouth daily.    . citalopram (CELEXA) 10 MG tablet TAKE 1 TABLET (10 MG TOTAL) BY MOUTH DAILY. 30 tablet 2  . clorazepate (TRANXENE-T) 3.75 MG tablet Take 1 tablet (3.75 mg total) by mouth 3 (three) times daily. 90 tablet 1  . donepezil  (ARICEPT) 10 MG tablet Take 1 tablet (10 mg total) by mouth daily. 90 tablet 3  . levothyroxine (SYNTHROID, LEVOTHROID) 50 MCG tablet Take 1 tablet (50 mcg total) by mouth daily before breakfast. 90 tablet 3  . lisinopril (PRINIVIL,ZESTRIL) 40 MG tablet Take 1 tablet (40 mg total) by mouth daily. 90 tablet 3  . pantoprazole (PROTONIX) 40 MG tablet TAKE 1 TABLET DAILY TO PREVENT ULCER 90 tablet 3  . potassium chloride SA (K-DUR,KLOR-CON) 20 MEQ tablet Take 20 mEq by mouth 2 (two) times daily.    . TRAVATAN Z 0.004 % SOLN ophthalmic solution Place 1 drop into both eyes at bedtime.      No current facility-administered medications on file prior to visit.   Allergies  Allergen Reactions  . Cardura [Doxazosin] Other (See Comments)  . Nsaids Other (See Comments)    unknown     BP 156/64 mmHg  Pulse 60  Temp(Src) 97.8 F (36.6 C) (Temporal)  Resp 16  Ht 5\' 5"  (1.651 m)  Wt 123 lb (55.792 kg)  BMI 20.47 kg/m2  SpO2 98% Wt Readings from Last 3 Encounters:  10/30/14 123 lb (55.792 kg)  09/24/14 123 lb (55.792 kg)  07/04/14 124 lb (56.246 kg)   Objective:   Physical Exam  Constitutional: She is oriented to person, place, and time. She appears well-developed and well-nourished. She does not have a sickly appearance. No distress.  HENT:  Head: Normocephalic.  Right Ear: Tympanic membrane, external ear and ear canal normal.  Left Ear: Tympanic membrane, external ear and ear canal normal.  Nose: Mucosal edema and rhinorrhea present. Right sinus exhibits no maxillary sinus tenderness and no frontal sinus tenderness. Left sinus exhibits no maxillary sinus tenderness and no frontal sinus tenderness.  Mouth/Throat: Uvula is midline and mucous membranes are normal. Mucous membranes are not pale and not dry. No trismus in the jaw. No uvula swelling. Posterior oropharyngeal erythema present. No oropharyngeal exudate, posterior oropharyngeal edema or tonsillar abscesses.  Turbinates erythematous  bilaterally.  Eyes: Conjunctivae and lids are normal. Pupils are equal, round, and reactive to light. Right eye exhibits no discharge. Left eye exhibits no discharge. No scleral icterus.  Neck: Trachea normal, normal range of motion and phonation normal. Neck supple. No tracheal tenderness present. No tracheal deviation present.  Cardiovascular: Normal rate, regular rhythm, S1 normal, S2 normal, normal heart sounds and normal pulses.  Exam reveals no gallop, no distant heart sounds and no friction rub.   No murmur heard. Pulmonary/Chest: Effort normal and breath sounds normal. No stridor. No respiratory distress. She has no decreased breath sounds. She has no wheezes. She has no rhonchi. She has no rales. She exhibits no tenderness.  Abdominal: Soft. Bowel sounds are normal. There is no tenderness. There is no rebound and no guarding.  Lymphadenopathy:  No tenderness or LAD.  Neurological: She is alert and oriented to person, place, and time. No cranial nerve deficit. Gait normal.  Walks with walker.  Skin: Skin is warm, dry and intact. No rash noted. She is not diaphoretic. No pallor.  Psychiatric: She has a normal mood and affect. Her speech is normal and behavior is normal. Judgment and thought content normal.  Had to repeat myself for her to understand what I was asking or saying.  Patient did not understand some things I was asking her.  Vitals reviewed.  Assessment & Plan:  1. Acute maxillary sinusitis, recurrence not specified -Take Z-Pak as prescribed- azithromycin (ZITHROMAX) 250 MG tablet; Take 2 tablets PO on day 1, then 1 tablet PO Q24H x 4 days  Dispense: 6 tablet; Refill: 1 -Take Allegra (without D) OTC- 1 pill at bedtime. -Can take Tylenol OTC for headache- follow directions on container.  Discussed medication effects and SE's.  Pt agreed to treatment plan. If you are not feeling better in 10-14 days, then please call the office. If headaches continue, might send to neurologist  for evaluation. Please keep your follow up appt on 12/28/14.  Adelaide Pfefferkorn, Lise AuerJennifer L, PA-C 11:19 AM [facility]

## 2014-11-21 ENCOUNTER — Emergency Department (HOSPITAL_COMMUNITY): Payer: Medicare Other

## 2014-11-21 ENCOUNTER — Inpatient Hospital Stay (HOSPITAL_COMMUNITY)
Admission: EM | Admit: 2014-11-21 | Discharge: 2014-11-22 | DRG: 312 | Disposition: A | Discharge status: Home / Self Care | Payer: Medicare Other | Attending: Internal Medicine | Admitting: Internal Medicine

## 2014-11-21 ENCOUNTER — Encounter (HOSPITAL_COMMUNITY): Payer: Self-pay | Admitting: Family Medicine

## 2014-11-21 ENCOUNTER — Inpatient Hospital Stay (HOSPITAL_COMMUNITY): Payer: Medicare Other

## 2014-11-21 DIAGNOSIS — M1990 Unspecified osteoarthritis, unspecified site: Secondary | ICD-10-CM | POA: Diagnosis present

## 2014-11-21 DIAGNOSIS — R05 Cough: Secondary | ICD-10-CM

## 2014-11-21 DIAGNOSIS — I951 Orthostatic hypotension: Secondary | ICD-10-CM | POA: Diagnosis present

## 2014-11-21 DIAGNOSIS — K219 Gastro-esophageal reflux disease without esophagitis: Secondary | ICD-10-CM | POA: Diagnosis present

## 2014-11-21 DIAGNOSIS — I674 Hypertensive encephalopathy: Secondary | ICD-10-CM | POA: Diagnosis present

## 2014-11-21 DIAGNOSIS — E780 Pure hypercholesterolemia: Secondary | ICD-10-CM | POA: Diagnosis present

## 2014-11-21 DIAGNOSIS — D696 Thrombocytopenia, unspecified: Secondary | ICD-10-CM | POA: Diagnosis present

## 2014-11-21 DIAGNOSIS — I248 Other forms of acute ischemic heart disease: Secondary | ICD-10-CM | POA: Diagnosis present

## 2014-11-21 DIAGNOSIS — E860 Dehydration: Secondary | ICD-10-CM | POA: Diagnosis present

## 2014-11-21 DIAGNOSIS — E119 Type 2 diabetes mellitus without complications: Secondary | ICD-10-CM | POA: Diagnosis present

## 2014-11-21 DIAGNOSIS — R9431 Abnormal electrocardiogram [ECG] [EKG]: Secondary | ICD-10-CM

## 2014-11-21 DIAGNOSIS — R059 Cough, unspecified: Secondary | ICD-10-CM

## 2014-11-21 DIAGNOSIS — Z7982 Long term (current) use of aspirin: Secondary | ICD-10-CM

## 2014-11-21 DIAGNOSIS — E039 Hypothyroidism, unspecified: Secondary | ICD-10-CM | POA: Diagnosis present

## 2014-11-21 DIAGNOSIS — I10 Essential (primary) hypertension: Secondary | ICD-10-CM | POA: Diagnosis present

## 2014-11-21 DIAGNOSIS — F0390 Unspecified dementia without behavioral disturbance: Secondary | ICD-10-CM | POA: Diagnosis present

## 2014-11-21 DIAGNOSIS — G9340 Encephalopathy, unspecified: Secondary | ICD-10-CM

## 2014-11-21 DIAGNOSIS — E876 Hypokalemia: Secondary | ICD-10-CM | POA: Diagnosis present

## 2014-11-21 DIAGNOSIS — R5383 Other fatigue: Secondary | ICD-10-CM

## 2014-11-21 DIAGNOSIS — R531 Weakness: Secondary | ICD-10-CM

## 2014-11-21 HISTORY — DX: Unspecified dementia, unspecified severity, without behavioral disturbance, psychotic disturbance, mood disturbance, and anxiety: F03.90

## 2014-11-21 LAB — CBC
HCT: 45.5 % (ref 36.0–46.0)
Hemoglobin: 15.1 g/dL — ABNORMAL HIGH (ref 12.0–15.0)
MCH: 30.3 pg (ref 26.0–34.0)
MCHC: 33.2 g/dL (ref 30.0–36.0)
MCV: 91.4 fL (ref 78.0–100.0)
Platelets: DECREASED 10*3/uL (ref 150–400)
RBC: 4.98 MIL/uL (ref 3.87–5.11)
RDW: 13.8 % (ref 11.5–15.5)
WBC: 8.5 10*3/uL (ref 4.0–10.5)

## 2014-11-21 LAB — TSH: TSH: 2.303 u[IU]/mL (ref 0.350–4.500)

## 2014-11-21 LAB — URINALYSIS, ROUTINE W REFLEX MICROSCOPIC
BILIRUBIN URINE: NEGATIVE
Glucose, UA: 500 mg/dL — AB
Ketones, ur: 15 mg/dL — AB
Leukocytes, UA: NEGATIVE
Nitrite: NEGATIVE
PH: 7 (ref 5.0–8.0)
Protein, ur: >300 mg/dL — AB
SPECIFIC GRAVITY, URINE: 1.018 (ref 1.005–1.030)
UROBILINOGEN UA: 1 mg/dL (ref 0.0–1.0)

## 2014-11-21 LAB — GLUCOSE, CAPILLARY
Glucose-Capillary: 110 mg/dL — ABNORMAL HIGH (ref 70–99)
Glucose-Capillary: 128 mg/dL — ABNORMAL HIGH (ref 70–99)

## 2014-11-21 LAB — HEPATIC FUNCTION PANEL
ALBUMIN: 3.6 g/dL (ref 3.5–5.2)
ALK PHOS: 69 U/L (ref 39–117)
ALT: 13 U/L (ref 0–35)
AST: 28 U/L (ref 0–37)
BILIRUBIN INDIRECT: 0.9 mg/dL (ref 0.3–0.9)
Bilirubin, Direct: 0.3 mg/dL (ref 0.0–0.3)
Total Bilirubin: 1.2 mg/dL (ref 0.3–1.2)
Total Protein: 7.4 g/dL (ref 6.0–8.3)

## 2014-11-21 LAB — BASIC METABOLIC PANEL
Anion gap: 15 (ref 5–15)
BUN: 9 mg/dL (ref 6–23)
CO2: 26 mmol/L (ref 19–32)
Calcium: 10.3 mg/dL (ref 8.4–10.5)
Chloride: 99 mEq/L (ref 96–112)
Creatinine, Ser: 0.67 mg/dL (ref 0.50–1.10)
GFR calc Af Amer: >90 mL/min (ref >90)
GFR, EST NON AFRICAN AMERICAN: 78 mL/min — AB (ref >90)
Glucose, Bld: 154 mg/dL — ABNORMAL HIGH (ref 70–99)
Potassium: 3 mmol/L — ABNORMAL LOW (ref 3.5–5.1)
Sodium: 140 mmol/L (ref 135–145)

## 2014-11-21 LAB — TROPONIN I: Troponin I: 0.06 ng/mL — ABNORMAL HIGH (ref <0.031)

## 2014-11-21 LAB — URINE MICROSCOPIC-ADD ON

## 2014-11-21 LAB — I-STAT TROPONIN, ED: TROPONIN I, POC: 0.04 ng/mL (ref 0.00–0.08)

## 2014-11-21 LAB — AMMONIA: AMMONIA: 19 umol/L (ref 11–32)

## 2014-11-21 MED ORDER — HALOPERIDOL LACTATE 5 MG/ML IJ SOLN
0.5000 mg | Freq: Once | INTRAMUSCULAR | Status: AC
Start: 2014-11-21 — End: 2014-11-22
  Administered 2014-11-22: 0.5 mg via INTRAMUSCULAR
  Filled 2014-11-21: qty 1

## 2014-11-21 MED ORDER — AMLODIPINE BESYLATE 5 MG PO TABS
5.0000 mg | ORAL_TABLET | Freq: Every day | ORAL | Status: DC
  Administered 2014-11-21 – 2014-11-22 (×2): 5 mg via ORAL
  Filled 2014-11-21 (×2): qty 1

## 2014-11-21 MED ORDER — SODIUM CHLORIDE 0.9 % IJ SOLN
3.0000 mL | Freq: Two times a day (BID) | INTRAMUSCULAR | Status: DC
  Administered 2014-11-21: 3 mL via INTRAVENOUS

## 2014-11-21 MED ORDER — HALOPERIDOL LACTATE 5 MG/ML IJ SOLN
1.0000 mg | Freq: Once | INTRAMUSCULAR | Status: AC
  Administered 2014-11-21: 1 mg via INTRAMUSCULAR
  Filled 2014-11-21: qty 1

## 2014-11-21 MED ORDER — ACETAMINOPHEN 325 MG PO TABS
650.0000 mg | ORAL_TABLET | Freq: Four times a day (QID) | ORAL | Status: DC | PRN
Start: 2014-11-21 — End: 2014-11-22

## 2014-11-21 MED ORDER — DONEPEZIL HCL 10 MG PO TABS
10.0000 mg | ORAL_TABLET | Freq: Every day | ORAL | Status: DC
  Filled 2014-11-21 (×2): qty 1

## 2014-11-21 MED ORDER — ENOXAPARIN SODIUM 40 MG/0.4ML ~~LOC~~ SOLN
40.0000 mg | SUBCUTANEOUS | Status: DC
  Administered 2014-11-21: 40 mg via SUBCUTANEOUS
  Filled 2014-11-21 (×2): qty 0.4

## 2014-11-21 MED ORDER — HYDRALAZINE HCL 20 MG/ML IJ SOLN
10.0000 mg | Freq: Once | INTRAMUSCULAR | Status: AC
  Administered 2014-11-21: 10 mg via INTRAVENOUS
  Filled 2014-11-21: qty 1

## 2014-11-21 MED ORDER — LATANOPROST 0.005 % OP SOLN
1.0000 [drp] | Freq: Every day | OPHTHALMIC | Status: DC
  Filled 2014-11-21: qty 2.5

## 2014-11-21 MED ORDER — PANTOPRAZOLE SODIUM 40 MG PO TBEC
40.0000 mg | DELAYED_RELEASE_TABLET | Freq: Every day | ORAL | Status: DC
  Administered 2014-11-21: 40 mg via ORAL
  Filled 2014-11-21: qty 1

## 2014-11-21 MED ORDER — ONDANSETRON HCL 4 MG/2ML IJ SOLN: 4.0000 mg | Freq: Four times a day (QID) | INTRAMUSCULAR | Status: DC | PRN

## 2014-11-21 MED ORDER — SODIUM CHLORIDE 0.9 % IV BOLUS (SEPSIS)
1000.0000 mL | Freq: Once | INTRAVENOUS | Status: AC
  Administered 2014-11-21: 1000 mL via INTRAVENOUS

## 2014-11-21 MED ORDER — ACETAMINOPHEN 650 MG RE SUPP: 650.0000 mg | Freq: Four times a day (QID) | RECTAL | Status: DC | PRN

## 2014-11-21 MED ORDER — LISINOPRIL 40 MG PO TABS
40.0000 mg | ORAL_TABLET | Freq: Every day | ORAL | Status: DC
  Administered 2014-11-22: 40 mg via ORAL
  Filled 2014-11-21: qty 1

## 2014-11-21 MED ORDER — POTASSIUM CHLORIDE 10 MEQ/100ML IV SOLN
10.0000 meq | Freq: Once | INTRAVENOUS | Status: AC
  Administered 2014-11-21: 10 meq via INTRAVENOUS
  Filled 2014-11-21: qty 100

## 2014-11-21 MED ORDER — INSULIN ASPART 100 UNIT/ML ~~LOC~~ SOLN
0.0000 [IU] | Freq: Three times a day (TID) | SUBCUTANEOUS | Status: DC
  Administered 2014-11-22: 2 [IU] via SUBCUTANEOUS

## 2014-11-21 MED ORDER — LEVOTHYROXINE SODIUM 50 MCG PO TABS
50.0000 ug | ORAL_TABLET | Freq: Every day | ORAL | Status: DC
  Filled 2014-11-21 (×2): qty 1

## 2014-11-21 MED ORDER — ONDANSETRON HCL 4 MG PO TABS: 4.0000 mg | ORAL_TABLET | Freq: Four times a day (QID) | ORAL | Status: DC | PRN

## 2014-11-21 MED ORDER — HYDRALAZINE HCL 20 MG/ML IJ SOLN
10.0000 mg | Freq: Four times a day (QID) | INTRAMUSCULAR | Status: DC | PRN
  Administered 2014-11-22: 10 mg via INTRAVENOUS
  Filled 2014-11-21: qty 1

## 2014-11-21 MED ORDER — SODIUM CHLORIDE 0.9 % IV SOLN
INTRAVENOUS | Status: DC
  Administered 2014-11-21 – 2014-11-22 (×2): via INTRAVENOUS
  Filled 2014-11-21 (×3): qty 1000

## 2014-11-21 MED ORDER — ASPIRIN EC 325 MG PO TBEC
325.0000 mg | DELAYED_RELEASE_TABLET | Freq: Every day | ORAL | Status: DC
  Administered 2014-11-21: 325 mg via ORAL
  Filled 2014-11-21 (×2): qty 1

## 2014-11-21 MED ORDER — POTASSIUM CHLORIDE CRYS ER 20 MEQ PO TBCR
20.0000 meq | EXTENDED_RELEASE_TABLET | Freq: Two times a day (BID) | ORAL | Status: DC
  Filled 2014-11-21 (×3): qty 1

## 2014-11-21 NOTE — Progress Notes (Signed)
Received tele alert from [facility], went to pt's room to see pt. Pt is confused and  agitated . Pt's IV was noted out. Pt took her ECG lead off  and doesn't allow staff to touch . Reorientation is given to pt with no effect. On-call provider Donnamarie PoagK Kirby notified via text page.  Awaiting orders.

## 2014-11-21 NOTE — ED Notes (Signed)
Patient's son at bedside.

## 2014-11-21 NOTE — Progress Notes (Signed)
Pt is very combative now. Pt needed 4 staffs to control her. Pt tries to bite staff on their hands and is throwing her legs towards staffs. On-call provider notified and order received for bilateral soft restraints for pt and staff's safety.

## 2014-11-21 NOTE — ED Notes (Signed)
Pt presents from home via [facility] with c/o fatigue.  Per EMS, pt's family reported patient was "not acting right" today - stated that patient was not up and walking around with her walker today like she normally does but was instead sitting on the couch for most of the day.  Per EMS, patient is at her orientation baseline of being oriented to self only.  PT does follow most commands and has a history of dementia. PT is alert and interactive at this time.

## 2014-11-21 NOTE — Progress Notes (Signed)
Pt refused her all meds  including po potassium. On-call provider notified of potassium result and asked if she would like to give iv runs of potassium . @2315  no new orders received at this time. Will monitor.

## 2014-11-21 NOTE — ED Provider Notes (Signed)
CSN: [id number]     Arrival date & time 11/21/14  1339 History   First MD Initiated Contact with Patient 11/21/14 1351     Chief Complaint  Patient presents with  . Fatigue     (Consider location/radiation/quality/duration/timing/severity/associated sxs/prior Treatment) HPI Comments: 81 year old female with past medical history of hypertension, diastolic dysfunction, diabetes, GERD and dementia presenting via EMS from home with fatigue. Patient's family reported that patient was "not acting right" today and that the patient normally is ambulatory with a walker, however today she would only sit on the couch or most of the day and would not be ambulatory. Per EMS, family reports patient is at her baseline mentation where she is only oriented to self. Level V caveat secondary to dementia.  The history is provided by the EMS personnel.    Past Medical History  Diagnosis Date  . Hypertension   . Diastolic dysfunction, left ventricle by ECHO 2011 03/25/2012  . Hypothyroidism 03/25/2012  . High cholesterol   . Type II diabetes mellitus   . H/O hiatal hernia   . Duodenal perforation 03/23/12  . Arthritis     "in my back"  . GERD (gastroesophageal reflux disease)   . Dementia    Past Surgical History  Procedure Laterality Date  . Bladder tacked    . Repair perforated ulcer  03/23/12  . Diagnostic laparoscopy  03/25/12    REPAIR OF PERFORATED ULCER with omental patch  . Abcess drainage  03/25/12     of abdominal & pelvic abscesses  . Laparoscopy  03/24/2012    Procedure: LAPAROSCOPY DIAGNOSTIC;  Surgeon: Ardeth Sportsman, MD;  Location: [facility] OR;  Service: General;  Laterality: N/A;  drainage of abdominal and pelvic abcesses  . Laparotomy  06/03/2012    Procedure: EXPLORATORY LAPAROTOMY;  Surgeon: Mariella Saa, MD;  Location: [facility] OR;  Service: General;  Laterality: N/A;   Family History  Problem Relation Age of Onset  . Hypertension Mother   . Hypertension Father   . Colon cancer Neg Hx    . Pancreatic cancer Neg Hx    History  Substance Use Topics  . Smoking status: Never Smoker   . Smokeless tobacco: Current User    Types: Snuff     Comment: 03/28/12 "use snuff q once in while; not regular"  . Alcohol Use: No   OB History    No data available     Review of Systems  Unable to perform ROS: Dementia      Allergies  Cardura and Nsaids  Home Medications   Prior to Admission medications   Medication Sig Start Date End Date Taking? Authorizing Provider  aspirin 325 MG EC tablet Take 325 mg by mouth daily.    Historical Provider, MD  Cholecalciferol (VITAMIN D3) 5000 UNITS CAPS Take 5,000 Units by mouth daily.    Historical Provider, MD  citalopram (CELEXA) 10 MG tablet TAKE 1 TABLET (10 MG TOTAL) BY MOUTH DAILY. 09/11/14   Lucky Cowboy, MD  clorazepate (TRANXENE-T) 3.75 MG tablet Take 1 tablet (3.75 mg total) by mouth 3 (three) times daily. 02/26/14   Melissa R Smith, PA-C  donepezil (ARICEPT) 10 MG tablet Take 1 tablet (10 mg total) by mouth daily. 03/23/14   Melissa R Smith, PA-C  levothyroxine (SYNTHROID, LEVOTHROID) 50 MCG tablet Take 1 tablet (50 mcg total) by mouth daily before breakfast. 12/06/13   Sharon Seller, NP  lisinopril (PRINIVIL,ZESTRIL) 40 MG tablet Take 1 tablet (40 mg total) by mouth  daily. 12/06/13   Sharon SellerJessica K Eubanks, NP  pantoprazole (PROTONIX) 40 MG tablet TAKE 1 TABLET DAILY TO PREVENT ULCER 06/04/14   Lucky CowboyWilliam McKeown, MD  potassium chloride SA (K-DUR,KLOR-CON) 20 MEQ tablet Take 20 mEq by mouth 2 (two) times daily.    Historical Provider, MD  TRAVATAN Z 0.004 % SOLN ophthalmic solution Place 1 drop into both eyes at bedtime.  04/13/14   Historical Provider, MD   BP 212/76 mmHg  Pulse 62  Temp(Src) 98.4 F (36.9 C) (Oral)  Resp 18  SpO2 99% Physical Exam  Constitutional: She appears well-developed and well-nourished. No distress.  Interactive, pleasant.  HENT:  Head: Normocephalic and atraumatic.  Dry MM.  Eyes: Conjunctivae are  normal.  Neck: Normal range of motion. Neck supple.  Cardiovascular: Normal rate and regular rhythm.   Murmur heard.  Systolic murmur is present with a grade of 3/6  Pulmonary/Chest: Effort normal and breath sounds normal.  Abdominal: Soft. Bowel sounds are normal. She exhibits no distension. There is no tenderness.  Neurological: She is alert.  Oriented to person.  Skin: Skin is warm and dry. She is not diaphoretic.  Nursing note and vitals reviewed.   ED Course  Procedures (including critical care time) Labs Review Labs Reviewed  CBC - Abnormal; Notable for the following:    Hemoglobin 15.1 (*)    All other components within normal limits  BASIC METABOLIC PANEL - Abnormal; Notable for the following:    Potassium 3.0 (*)    Glucose, Bld 154 (*)    GFR calc non Af Amer 78 (*)    All other components within normal limits  URINALYSIS, ROUTINE W REFLEX MICROSCOPIC - Abnormal; Notable for the following:    Glucose, UA 500 (*)    Hgb urine dipstick LARGE (*)    Ketones, ur 15 (*)    Protein, ur >300 (*)    All other components within normal limits  URINE MICROSCOPIC-ADD ON - Abnormal; Notable for the following:    Bacteria, UA FEW (*)    Casts HYALINE CASTS (*)    All other components within normal limits  I-STAT TROPOININ, ED    Imaging Review Dg Chest 2 View  11/21/2014   CLINICAL DATA:  Cough, altered mental status  EXAM: CHEST  2 VIEW  COMPARISON:  08/25/2013  FINDINGS: Cardiac shadow is mildly enlarged. Aortic calcifications are again seen. The lungs are well-aerated without focal infiltrate or sizable effusion. Minimal scarring is noted in the left base laterally. No acute bony abnormality is noted.  IMPRESSION: No acute abnormality noted.   Electronically Signed   By: Alcide CleverMark  Lukens M.D.   On: 11/21/2014 15:56     EKG Interpretation None      Date: 11/21/2014  Rate: 71  Rhythm: normal sinus rhythm  QRS Axis: normal  Intervals: normal  ST/T Wave abnormalities:  nonspecific T wave changes  Conduction Disutrbances:baseline wander V1  Narrative Interpretation: sinus rhythm, probable LAE, left ventricular hypertrophy, nonspecific t abnormality, anterolateral leads, baseline wander leads v1  Old EKG Reviewed: changes noted biphasic t-waves V4, V5, V6 not present 09/24/14   MDM   Final diagnoses:  Abnormal EKG  Orthostatic hypotension  Other fatigue  Weakness   Pt in NAD. At baseline mentation according to EMS. Hypertensive, vitals otherwise stable. EKG changes noted (biphasic T-waves V4, V5, V6) compared to 09/24/2014. She is orthostatic. Hypokalemia noted, will replace IV. Pt will be admitted given EKG changes and orthostatic hypotension. Admission accepted by Dr. Arbutus Leasat,  TRH.  Discussed with attending Dr. Clarene Duke who agrees with plan of care.    Kathrynn Speed, PA-C 11/21/14 1646  Samuel Jester, DO 11/24/14 1343

## 2014-11-21 NOTE — ED Notes (Signed)
Patient transported to X-ray 

## 2014-11-21 NOTE — ED Notes (Signed)
Attempted report 

## 2014-11-21 NOTE — H&P (Signed)
History and Physical  JERMIYA REICHL ZOX:[id number] DOB: [birth year] DOA: 11/21/2014   PCP: [name], MD   Chief Complaint: Altered mental status  HPI:  81 year old female with a history of hypertension, diastolic dysfunction, hyperlipidemia, hypothyroidism, diet-controlled diabetes mellitus presents with altered mental status. The patient is unable to provide any history. All of this history is obtained from reviewing the medical record as well as speaking with the patient's caretaker, Alinda Money. Apparently the patient was in her usual state of health early on the morning of admission. In the mid to late morning, when the patient's son came to pick her up to go to the store the patient was found confused sitting up on her sofa and her bedroom. Apparently the patient had an episode of vomiting. Prior to today's events, the patient had been in her usual state of health. At baseline, the patient ambulate with a walker, and is able to perform all her activities of daily living including feeding herself, bathing, and clothing herself. Apparently, the patient is normally alert and oriented 3 according to her caretaker. Because of the patient's confusion, EMS was activated. There has not been any recent changes in any of her medications. There is not been any fevers, chills, complaints of chest pain, shortness of breath, vomiting, diarrhea, abdominal pain, or dysuria. The patient's caretaker states that he usually lays out her medications in pill boxes to ensure that the patient takes her medications. Apparently the patient has been compliant.  In the emergency department, the patient was found to have a blood pressure of 212/76. The patient was afebrile. Potassium was 3.0. Hemoglobin was 15.1, WBC 8.5. Urinalysis was negative for any pyuria. Orthostatic vital signs were positive in the emergency department. Uncorrected calcium was 10.3. Assessment/Plan: Acute encephalopathy -Working diagnosis  is hypertensive encephalopathy versus dehydration -In addition the patient likely has a degree of mild hypercalcemia-Add albumin to the patient's labs to get a corrected calcium -The patient's hemoglobin is greater than 2 g above her usual baseline suggesting a degree of hemoconcentration -Urinalysis is negative for significant pyuria -CT brain without contrast -cycle troponins -serum B12, RBC folate, ammonia, TSH -No focal deficits on exam- Dehydration/Orthostatic Hypotension -orthostatics positive in ED -Continue IV fluids  -BMP in the morning  hypertensive encephalopathy -Restart lisinopril -Add amlodipine -Hydralazine IV when necessary SBP >180 Diabetes mellitus type 2  -Hemoglobin A1c  -Given the patient's age and current recommendations, allow for low blood control  Hypothyroidism  -Continue current dose of Synthroid  -Check TSH        Past Medical History  Diagnosis Date  . Hypertension   . Diastolic dysfunction, left ventricle by ECHO 2011 03/25/2012  . Hypothyroidism 03/25/2012  . High cholesterol   . Type II diabetes mellitus   . H/O hiatal hernia   . Duodenal perforation 03/23/12  . Arthritis     "in my back"  . GERD (gastroesophageal reflux disease)   . Dementia    Past Surgical History  Procedure Laterality Date  . Bladder tacked    . Repair perforated ulcer  03/23/12  . Diagnostic laparoscopy  03/25/12    REPAIR OF PERFORATED ULCER with omental patch  . Abcess drainage  03/25/12     of abdominal & pelvic abscesses  . Laparoscopy  03/24/2012    Procedure: LAPAROSCOPY DIAGNOSTIC;  Surgeon: Ardeth Sportsman, MD;  Location: [facility] OR;  Service: General;  Laterality: N/A;  drainage of abdominal and pelvic abcesses  . Laparotomy  06/03/2012    Procedure: EXPLORATORY LAPAROTOMY;  Surgeon: Mariella Saa, MD;  Location: [facility] OR;  Service: General;  Laterality: N/A;   Social History:  reports that she has never smoked. Her smokeless tobacco use includes Snuff. She  reports that she does not drink alcohol or use illicit drugs.   Family History  Problem Relation Age of Onset  . Hypertension Mother   . Hypertension Father   . Colon cancer Neg Hx   . Pancreatic cancer Neg Hx      Allergies  Allergen Reactions  . Cardura [Doxazosin] Other (See Comments)  . Nsaids Other (See Comments)    unknown      Prior to Admission medications   Medication Sig Start Date End Date Taking? Authorizing Provider  aspirin 325 MG EC tablet Take 325 mg by mouth daily.    Historical Provider, MD  Cholecalciferol (VITAMIN D3) 5000 UNITS CAPS Take 5,000 Units by mouth daily.    Historical Provider, MD  citalopram (CELEXA) 10 MG tablet TAKE 1 TABLET (10 MG TOTAL) BY MOUTH DAILY. 09/11/14   Lucky Cowboy, MD  clorazepate (TRANXENE-T) 3.75 MG tablet Take 1 tablet (3.75 mg total) by mouth 3 (three) times daily. 02/26/14   Melissa R Smith, PA-C  donepezil (ARICEPT) 10 MG tablet Take 1 tablet (10 mg total) by mouth daily. 03/23/14   Melissa R Smith, PA-C  levothyroxine (SYNTHROID, LEVOTHROID) 50 MCG tablet Take 1 tablet (50 mcg total) by mouth daily before breakfast. 12/06/13   Sharon Seller, NP  lisinopril (PRINIVIL,ZESTRIL) 40 MG tablet Take 1 tablet (40 mg total) by mouth daily. 12/06/13   Sharon Seller, NP  pantoprazole (PROTONIX) 40 MG tablet TAKE 1 TABLET DAILY TO PREVENT ULCER 06/04/14   Lucky Cowboy, MD  potassium chloride SA (K-DUR,KLOR-CON) 20 MEQ tablet Take 20 mEq by mouth 2 (two) times daily.    Historical Provider, MD  TRAVATAN Z 0.004 % SOLN ophthalmic solution Place 1 drop into both eyes at bedtime.  04/13/14   Historical Provider, MD    Review of Systems:   unobtainable secondary to patient's mental status  Physical Exam: Filed Vitals:   11/21/14 1415 11/21/14 1520 11/21/14 1530 11/21/14 1646  BP: 188/69 186/95 212/76 210/80  Pulse: 65 71 62   Temp:      TempSrc:      Resp: SpO2: 97% 97% 99%    General:  Awake and alert, NAD,  nontoxic, pleasant/cooperative Head/Eye: No conjunctival hemorrhage, no icterus, [place]/AT, No nystagmus ENT:  No icterus,  no pharyngeal exudate Neck:  No masses, no lymphadenpathy, no bruits, no meningismus CV:  RRR, no rub, no gallop, no S3 Lung:  poor inspiratory effort but clear to auscultation  Abdomen: soft/NT, +BS, nondistended, no peritoneal signs Ext: No cyanosis, No rashes, No petechiae, No lymphangitis, No edema Neuro: CNII-XII intact, strength 4/5 in bilateral upper and lower extremities, no dysmetria  Labs on Admission:  Basic Metabolic Panel:  Recent Labs Lab 11/21/14 1422  NA 140  K 3.0*  CL 99  CO2 26  GLUCOSE 154*  BUN 9  CREATININE 0.67  CALCIUM 10.3   Liver Function Tests: No results for input(s): AST, ALT, ALKPHOS, BILITOT, PROT, ALBUMIN in the last 168 hours. No results for input(s): LIPASE, AMYLASE in the last 168 hours. No results for input(s): AMMONIA in the last 168 hours. CBC:  Recent Labs Lab 11/21/14 1422  WBC 8.5  HGB 15.1*  HCT 45.5  MCV 91.4  PLT PLATELET CLUMPS NOTED ON SMEAR, COUNT APPEARS DECREASED   Cardiac Enzymes: No results for input(s): CKTOTAL, CKMB, CKMBINDEX, TROPONINI in the last 168 hours. BNP: Invalid input(s): POCBNP CBG: No results for input(s): GLUCAP in the last 168 hours.  Radiological Exams on Admission: Dg Chest 2 View  11/21/2014   CLINICAL DATA:  Cough, altered mental status  EXAM: CHEST  2 VIEW  COMPARISON:  08/25/2013  FINDINGS: Cardiac shadow is mildly enlarged. Aortic calcifications are again seen. The lungs are well-aerated without focal infiltrate or sizable effusion. Minimal scarring is noted in the left base laterally. No acute bony abnormality is noted.  IMPRESSION: No acute abnormality noted.   Electronically Signed   By: Alcide CleverMark  Lukens M.D.   On: 11/21/2014 15:56    EKG: Independently reviewed. Sinus, LVH, nonspecific ST-T changes    Time spent:8560minutes Code Status:   FULL Family Communication:    Spoke with caretaker, Alinda Moneyony on phone   Ameshia Pewitt, DO  [facility] Pager [phone]  If 7PM-7AM, please contact night-coverage [email or web address] Password [email or web address] 11/21/2014, 5:12 PM

## 2014-11-21 NOTE — ED Notes (Signed)
Admitting MD at bedside.

## 2014-11-22 LAB — HEMOGLOBIN A1C
Hgb A1c MFr Bld: 6 % — ABNORMAL HIGH (ref <5.7)
Mean Plasma Glucose: 126 mg/dL — ABNORMAL HIGH (ref <117)

## 2014-11-22 LAB — CBC
HCT: 42 % (ref 36.0–46.0)
Hemoglobin: 13.4 g/dL (ref 12.0–15.0)
MCH: 29.4 pg (ref 26.0–34.0)
MCHC: 31.9 g/dL (ref 30.0–36.0)
MCV: 92.1 fL (ref 78.0–100.0)
Platelets: 122 10*3/uL — ABNORMAL LOW (ref 150–400)
RBC: 4.56 MIL/uL (ref 3.87–5.11)
RDW: 14 % (ref 11.5–15.5)
WBC: 7.3 10*3/uL (ref 4.0–10.5)

## 2014-11-22 LAB — BASIC METABOLIC PANEL
ANION GAP: 8 (ref 5–15)
BUN: 7 mg/dL (ref 6–23)
CO2: 32 mmol/L (ref 19–32)
Calcium: 9.6 mg/dL (ref 8.4–10.5)
Chloride: 101 mEq/L (ref 96–112)
Creatinine, Ser: 0.61 mg/dL (ref 0.50–1.10)
GFR, EST NON AFRICAN AMERICAN: 80 mL/min — AB (ref >90)
Glucose, Bld: 130 mg/dL — ABNORMAL HIGH (ref 70–99)
Potassium: 2.7 mmol/L — CL (ref 3.5–5.1)
Sodium: 141 mmol/L (ref 135–145)

## 2014-11-22 LAB — TROPONIN I
Troponin I: 0.07 ng/mL — ABNORMAL HIGH (ref <0.031)
Troponin I: 0.08 ng/mL — ABNORMAL HIGH (ref <0.031)

## 2014-11-22 LAB — VITAMIN B12: VITAMIN B 12: 361 pg/mL (ref 211–911)

## 2014-11-22 LAB — GLUCOSE, CAPILLARY
GLUCOSE-CAPILLARY: 100 mg/dL — AB (ref 70–99)
GLUCOSE-CAPILLARY: 146 mg/dL — AB (ref 70–99)
Glucose-Capillary: 118 mg/dL — ABNORMAL HIGH (ref 70–99)

## 2014-11-22 MED ORDER — HALOPERIDOL LACTATE 5 MG/ML IJ SOLN
5.0000 mg | Freq: Four times a day (QID) | INTRAMUSCULAR | Status: DC | PRN
  Administered 2014-11-22: 5 mg via INTRAMUSCULAR
  Filled 2014-11-22: qty 1

## 2014-11-22 MED ORDER — POTASSIUM CHLORIDE CRYS ER 20 MEQ PO TBCR
30.0000 meq | EXTENDED_RELEASE_TABLET | Freq: Two times a day (BID) | ORAL | Status: DC
  Filled 2014-11-22 (×2): qty 1

## 2014-11-22 MED ORDER — POTASSIUM CHLORIDE 20 MEQ/15ML (10%) PO SOLN
40.0000 meq | Freq: Every day | ORAL | Status: DC
  Administered 2014-11-22: 40 meq via ORAL
  Filled 2014-11-22: qty 30

## 2014-11-22 MED ORDER — HALOPERIDOL 5 MG PO TABS
5.0000 mg | ORAL_TABLET | Freq: Four times a day (QID) | ORAL | Status: DC | PRN
  Filled 2014-11-22: qty 1

## 2014-11-22 MED ORDER — AMLODIPINE BESYLATE 5 MG PO TABS: 5.0000 mg | ORAL_TABLET | Freq: Every day | ORAL | Status: DC

## 2014-11-22 MED ORDER — POTASSIUM CHLORIDE 10 MEQ/100ML IV SOLN
10.0000 meq | INTRAVENOUS | Status: AC
  Administered 2014-11-22 (×3): 10 meq via INTRAVENOUS
  Filled 2014-11-22 (×3): qty 100

## 2014-11-22 NOTE — Progress Notes (Signed)
Megan CapriceHester R Szostak to be D/C'd Home per MD order.  Discussed with the patient and all questions fully answered.    Medication List    TAKE these medications        amLODipine 5 MG tablet  Commonly known as:  NORVASC  Take 1 tablet (5 mg total) by mouth daily.     aspirin 325 MG EC tablet  Take 325 mg by mouth daily.     citalopram 10 MG tablet  Commonly known as:  CELEXA  TAKE 1 TABLET (10 MG TOTAL) BY MOUTH DAILY.     clorazepate 3.75 MG tablet  Commonly known as:  TRANXENE-T  Take 1 tablet (3.75 mg total) by mouth 3 (three) times daily.     donepezil 10 MG tablet  Commonly known as:  ARICEPT  Take 1 tablet (10 mg total) by mouth daily.     levothyroxine 50 MCG tablet  Commonly known as:  SYNTHROID, LEVOTHROID  Take 1 tablet (50 mcg total) by mouth daily before breakfast.     lisinopril 40 MG tablet  Commonly known as:  PRINIVIL,ZESTRIL  Take 1 tablet (40 mg total) by mouth daily.     pantoprazole 40 MG tablet  Commonly known as:  PROTONIX  TAKE 1 TABLET DAILY TO PREVENT ULCER     potassium chloride SA 20 MEQ tablet  Commonly known as:  K-DUR,KLOR-CON  Take 20 mEq by mouth 2 (two) times daily.     TRAVATAN Z 0.004 % Soln ophthalmic solution  Generic drug:  Travoprost (BAK Free)  Place 1 drop into both eyes at bedtime.     Vitamin D3 5000 UNITS Caps  Take 5,000 Units by mouth daily.        VVS, Skin clean, dry and intact without evidence of skin break down, no evidence of skin tears noted. IV catheter discontinued intact. Site without signs and symptoms of complications. Dressing and pressure applied.  An After Visit Summary was printed and given to the patient/family.  D/c education completed with patient/family including follow up instructions, medication list, d/c activities limitations if indicated, with other d/c instructions as indicated by MD - patient/family able to verbalize understanding, all questions fully answered.   Patient/family instructed to  return to ED, call 911, or call MD for any changes in condition.   Patient escorted via WC, and D/C home via private auto.  Beckey DowningFlores, Darnell Jeschke F 11/22/2014 5:33 PM

## 2014-11-22 NOTE — Progress Notes (Signed)
Donnamarie PoagK Kirby, NP notified of troponin result (0.07) via textpage. @0226 , on-call provider paged again to notify the troponin result. Will monitor.

## 2014-11-22 NOTE — Progress Notes (Signed)
Utilization review completed. Makena Mcgrady, RN, BSN. 

## 2014-11-22 NOTE — Progress Notes (Signed)
Restraints d/c at this time.

## 2014-11-22 NOTE — Discharge Summary (Signed)
Physician Discharge Summary  Megan [name]:[id number] DOB: 1927/05/31 DOA: 11/21/2014  PCP: [name], MD  Admit date: 11/21/2014 Discharge date: 11/22/2014  Recommendations for Outpatient Follow-up:  1. Pt will need to follow up with PCP in 2 weeks post discharge 2. Please obtain BMP and CBC in one week  Discharge Diagnoses:  Acute encephalopathy -Combination of  hypertensive encephalopathy and dehydration -In addition the patient likely has a degree of mild hypercalcemia--corrected calcium 10.6 at the time of admission -The patient's hemoglobin is greater than 2 g above her usual baseline suggesting a degree of hemoconcentration -Urinalysis is negative for significant pyuria -CT brain without contrast--negative for acute findings -serum B12--361,  -RBC folate--pending at the time of discharge -ammonia--19 -TSH--2.303 -No focal deficits on exam -On the day of discharge, the patient's mental status was improved--the patient was much more alert, but remained pleasantly confused which is near her baseline according to the patient's son Dehydration/Orthostatic Hypotension -orthostatics positive in ED -Continued IV fluids  Minimally elevated troponins -Due to demand ischemia -EKG without concerning ST-T wave changes and pt without chest pain -continue home dose of ASA hypertensive encephalopathy -Restart lisinopril 40 mg daily -Added amlodipine  daily -Hydralazine IV when necessary SBP >180 Diabetes mellitus type 2  -Hemoglobin A1c--6.0  -Given the patient's age and current recommendations, allow for liberal CBG control  -will not d/c with any oral hypoglycemic agents Hypothyroidism  -Continue current dose of Synthroid  -Check TSH--2.303 Hypokalemia -Repleted Thrombocytopenia -This has been chronic -No bleeding complications  Discharge Condition: stable  Disposition: home with 24/7 care  Diet:regular Wt Readings from Last 3 Encounters:    10/30/14 55.792 kg (123 lb)  09/24/14 55.792 kg (123 lb)  07/04/14 56.246 kg (124 lb)    History of present illness:  81 year old female with a history of hypertension, diastolic dysfunction, hyperlipidemia, hypothyroidism, diet-controlled diabetes mellitus presents with altered mental status. The patient is unable to provide any history. All of this history is obtained from reviewing the medical record as well as speaking with the patient's caretaker, Alinda Money. Apparently the patient was in her usual state of health early on the morning of admission. In the mid to late morning, when the patient's son came to pick her up to go to the store the patient was found confused sitting up on her sofa and her bedroom. Apparently the patient had an episode of vomiting. Prior to today's events, the patient had been in her usual state of health. At baseline, the patient ambulates with a walker. The patient was found to be dehydrated with orthostatic hypotension. She was started on intravenous fluids. Urinalysis is negative for pyuria. The patient was noted to be hypokalemic. She was repleted. Workup for reversible causes of delirium was unremarkable. Chest x-ray was negative for any infiltrates. CT of the brain was negative for any acute changes. The patient's blood pressure was noted to be significantly elevated which likely contributed to her encephalopathy. Amlodipine was added to her medication regimen. Her blood pressure improved. On the day of discharge, the patient's mental status was much improved although she remained pleasantly confused. After discussion with the patient's son, it appears that the patient was near her baseline mental status.    Discharge Exam: Filed Vitals:   11/22/14 1331  BP: 157/50  Pulse:   Temp:   Resp:    Filed Vitals:   11/22/14 0640 11/22/14 0744 11/22/14 1329 11/22/14 1331  BP: 190/65 122/86 153/109 157/50  Pulse:  95 59   Temp:  97.9 F (36.6 C) 97.9 F (36.6 C) 98 F  (36.7 C)   TempSrc: Oral Oral Oral   Resp: SpO2: 100% 100% 100%    General: A&O x 2, NAD, pleasant, cooperative Cardiovascular: RRR, no rub, no gallop, no S3 Respiratory: CTAB, no wheeze, no rhonchi Abdomen:soft, nontender, nondistended, positive bowel sounds Extremities: No edema, No lymphangitis, no petechiae  Discharge Instructions      Discharge Instructions    Diet - low sodium heart healthy    Complete by:  As directed      Increase activity slowly    Complete by:  As directed             Medication List    TAKE these medications        amLODipine 5 MG tablet  Commonly known as:  NORVASC  Take 1 tablet (5 mg total) by mouth daily.     aspirin 325 MG EC tablet  Take 325 mg by mouth daily.     citalopram 10 MG tablet  Commonly known as:  CELEXA  TAKE 1 TABLET (10 MG TOTAL) BY MOUTH DAILY.     clorazepate 3.75 MG tablet  Commonly known as:  TRANXENE-T  Take 1 tablet (3.75 mg total) by mouth 3 (three) times daily.     donepezil 10 MG tablet  Commonly known as:  ARICEPT  Take 1 tablet (10 mg total) by mouth daily.     levothyroxine 50 MCG tablet  Commonly known as:  SYNTHROID, LEVOTHROID  Take 1 tablet (50 mcg total) by mouth daily before breakfast.     lisinopril 40 MG tablet  Commonly known as:  PRINIVIL,ZESTRIL  Take 1 tablet (40 mg total) by mouth daily.     pantoprazole 40 MG tablet  Commonly known as:  PROTONIX  TAKE 1 TABLET DAILY TO PREVENT ULCER     potassium chloride SA 20 MEQ tablet  Commonly known as:  K-DUR,KLOR-CON  Take 20 mEq by mouth 2 (two) times daily.     TRAVATAN Z 0.004 % Soln ophthalmic solution  Generic drug:  Travoprost (BAK Free)  Place 1 drop into both eyes at bedtime.     Vitamin D3 5000 UNITS Caps  Take 5,000 Units by mouth daily.         The results of significant diagnostics from this hospitalization (including imaging, microbiology, ancillary and laboratory) are listed below for reference.     Significant Diagnostic Studies: Dg Chest 2 View  11/21/2014   CLINICAL DATA:  Cough, altered mental status  EXAM: CHEST  2 VIEW  COMPARISON:  08/25/2013  FINDINGS: Cardiac shadow is mildly enlarged. Aortic calcifications are again seen. The lungs are well-aerated without focal infiltrate or sizable effusion. Minimal scarring is noted in the left base laterally. No acute bony abnormality is noted.  IMPRESSION: No acute abnormality noted.   Electronically Signed   By: Alcide Clever M.D.   On: 11/21/2014 15:56   Ct Head Wo Contrast  11/21/2014   CLINICAL DATA:  81 year old female with a history of acute encephalopathy  EXAM: CT HEAD WITHOUT CONTRAST  TECHNIQUE: Contiguous axial images were obtained from the base of the skull through the vertex without intravenous contrast.  COMPARISON:  Head CT 08/25/2013, 02/27/2013, MRI 08/26/2013  FINDINGS: No acute fracture of the calvarium or aggressive lesion identified. Hyperostosis present.  No fluid within the mastoid air cells.  Axillary sinus, sphenoid sinus patent. Trace fluid within the anterior left-sided ethmoid air  cells. Frontal sinuses are clear.  Unremarkable appearance of the orbits.  Unremarkable appearance of the soft tissues of the scalp.  No intracranial hemorrhage, midline shift, mass effect.  The gray-white differentiation is maintained.  Extensive confluent hypodensity within the hemispheric white matter was present on comparison CTs, with the correlate finding present on prior MRI.  Extensive intracranial atherosclerotic calcifications.  Configuration of the ventricles is essentially unchanged compared to prior CT and MRI.  IMPRESSION: No CT evidence of acute intracranial abnormality.  Extensive white matter changes, similar to comparison studies, compatible with chronic microvascular ischemic disease. Associated extensive intracranial atherosclerosis.  Signed,  Yvone NeuJaime S. Loreta AveWagner, DO  [facility]  [facility]   Electronically Signed   By: Gilmer MorJaime  Wagner D.O.   On: 11/21/2014 20:46     Microbiology: No results found for this or any previous visit (from the past 240 hour(s)).   Labs: Basic Metabolic Panel:  Recent Labs Lab 11/21/14 1422 11/22/14 0551  NA 140 141  K 3.0* 2.7*  CL 99 101  CO2 26 32  GLUCOSE 154* 130*  BUN 9 7  CREATININE 0.67 0.61  CALCIUM 10.3 9.6   Liver Function Tests:  Recent Labs Lab 11/21/14 1422  AST 28  ALT 13  ALKPHOS 69  BILITOT 1.2  PROT 7.4  ALBUMIN 3.6   No results for input(s): LIPASE, AMYLASE in the last 168 hours.  Recent Labs Lab 11/21/14 1858  AMMONIA 19   CBC:  Recent Labs Lab 11/21/14 1422 11/22/14 0551  WBC 8.5 7.3  HGB 15.1* 13.4  HCT 45.5 42.0  MCV 91.4 92.1  PLT PLATELET CLUMPS NOTED ON SMEAR, COUNT APPEARS DECREASED 122*   Cardiac Enzymes:  Recent Labs Lab 11/21/14 1858 11/22/14 0007 11/22/14 0551  TROPONINI 0.06* 0.07* 0.08*   BNP: Invalid input(s): POCBNP CBG:  Recent Labs Lab 11/21/14 1817 11/21/14 2215 11/22/14 0746 11/22/14 1210  GLUCAP 110* 128* 118* 100*    Time coordinating discharge:  Greater than 30 minutes  Signed:  Carah Barrientes, DO [facility] Pager: [phone] 11/22/2014, 2:04 PM

## 2014-11-26 LAB — FOLATE RBC
FOLATE, HEMOLYSATE: 443.7 ng/mL
Folate, RBC: 988 ng/mL (ref >498)
Hematocrit: 44.9 % (ref 34.0–46.6)

## 2014-12-05 ENCOUNTER — Ambulatory Visit: Payer: Self-pay | Admitting: Physician Assistant

## 2014-12-06 ENCOUNTER — Ambulatory Visit: Payer: Self-pay | Admitting: Physician Assistant

## 2014-12-07 ENCOUNTER — Other Ambulatory Visit: Payer: Self-pay | Admitting: Internal Medicine

## 2014-12-07 ENCOUNTER — Other Ambulatory Visit: Payer: Self-pay

## 2014-12-07 MED ORDER — LISINOPRIL 40 MG PO TABS: 40.0000 mg | ORAL_TABLET | Freq: Every day | ORAL | Status: AC

## 2014-12-13 ENCOUNTER — Other Ambulatory Visit: Payer: Self-pay | Admitting: Internal Medicine

## 2014-12-19 ENCOUNTER — Ambulatory Visit (INDEPENDENT_AMBULATORY_CARE_PROVIDER_SITE_OTHER): Payer: Medicare Other | Admitting: Physician Assistant

## 2014-12-19 ENCOUNTER — Encounter: Payer: Self-pay | Admitting: Physician Assistant

## 2014-12-19 ENCOUNTER — Other Ambulatory Visit: Payer: Self-pay | Admitting: Internal Medicine

## 2014-12-19 ENCOUNTER — Telehealth: Payer: Self-pay | Admitting: Internal Medicine

## 2014-12-19 VITALS — BP 140/72 | HR 72 | Temp 98.3°F | Resp 16 | Ht 68.0 in | Wt 119.0 lb

## 2014-12-19 DIAGNOSIS — I700 Atherosclerosis of aorta: Secondary | ICD-10-CM

## 2014-12-19 DIAGNOSIS — K2100 Gastro-esophageal reflux disease with esophagitis, without bleeding: Secondary | ICD-10-CM

## 2014-12-19 DIAGNOSIS — Z79899 Other long term (current) drug therapy: Secondary | ICD-10-CM

## 2014-12-19 DIAGNOSIS — Z9181 History of falling: Secondary | ICD-10-CM

## 2014-12-19 DIAGNOSIS — F0150 Vascular dementia without behavioral disturbance: Secondary | ICD-10-CM

## 2014-12-19 DIAGNOSIS — E039 Hypothyroidism, unspecified: Secondary | ICD-10-CM

## 2014-12-19 DIAGNOSIS — I10 Essential (primary) hypertension: Secondary | ICD-10-CM

## 2014-12-19 DIAGNOSIS — R6889 Other general symptoms and signs: Secondary | ICD-10-CM

## 2014-12-19 DIAGNOSIS — E1121 Type 2 diabetes mellitus with diabetic nephropathy: Secondary | ICD-10-CM

## 2014-12-19 DIAGNOSIS — Z1331 Encounter for screening for depression: Secondary | ICD-10-CM

## 2014-12-19 DIAGNOSIS — K56609 Unspecified intestinal obstruction, unspecified as to partial versus complete obstruction: Secondary | ICD-10-CM

## 2014-12-19 DIAGNOSIS — N390 Urinary tract infection, site not specified: Secondary | ICD-10-CM

## 2014-12-19 DIAGNOSIS — E559 Vitamin D deficiency, unspecified: Secondary | ICD-10-CM

## 2014-12-19 DIAGNOSIS — I951 Orthostatic hypotension: Secondary | ICD-10-CM

## 2014-12-19 DIAGNOSIS — Z0001 Encounter for general adult medical examination with abnormal findings: Secondary | ICD-10-CM

## 2014-12-19 DIAGNOSIS — K5669 Other intestinal obstruction: Secondary | ICD-10-CM

## 2014-12-19 LAB — CBC WITH DIFFERENTIAL/PLATELET
BASOS ABS: 0.1 10*3/uL (ref 0.0–0.1)
Basophils Relative: 1 % (ref 0–1)
Eosinophils Absolute: 0.2 10*3/uL (ref 0.0–0.7)
Eosinophils Relative: 3 % (ref 0–5)
HCT: 41 % (ref 36.0–46.0)
Hemoglobin: 13.3 g/dL (ref 12.0–15.0)
Lymphocytes Relative: 35 % (ref 12–46)
Lymphs Abs: 2.1 10*3/uL (ref 0.7–4.0)
MCH: 28.9 pg (ref 26.0–34.0)
MCHC: 32.4 g/dL (ref 30.0–36.0)
MCV: 88.9 fL (ref 78.0–100.0)
MPV: 11.1 fL (ref 8.6–12.4)
Monocytes Absolute: 0.5 10*3/uL (ref 0.1–1.0)
Monocytes Relative: 9 % (ref 3–12)
NEUTROS ABS: 3.1 10*3/uL (ref 1.7–7.7)
NEUTROS PCT: 52 % (ref 43–77)
Platelets: 198 10*3/uL (ref 150–400)
RBC: 4.61 MIL/uL (ref 3.87–5.11)
RDW: 14.6 % (ref 11.5–15.5)
WBC: 5.9 10*3/uL (ref 4.0–10.5)

## 2014-12-19 LAB — TSH: TSH: 3.053 u[IU]/mL (ref 0.350–4.500)

## 2014-12-19 MED ORDER — AMLODIPINE BESYLATE 5 MG PO TABS: ORAL_TABLET | ORAL | Status: DC

## 2014-12-19 MED ORDER — CLORAZEPATE DIPOTASSIUM 3.75 MG PO TABS: 3.7500 mg | ORAL_TABLET | Freq: Three times a day (TID) | ORAL | Status: DC

## 2014-12-19 MED ORDER — CITALOPRAM HYDROBROMIDE 10 MG PO TABS: ORAL_TABLET | ORAL | Status: DC

## 2014-12-19 MED ORDER — PANTOPRAZOLE SODIUM 40 MG PO TBEC: DELAYED_RELEASE_TABLET | ORAL | Status: DC

## 2014-12-19 NOTE — Patient Instructions (Signed)

## 2014-12-19 NOTE — Telephone Encounter (Signed)
Patient out of medicine, Refill request to [facility] [address] on pantoprazole (PROTONIX) 40 MG tablet [[id number]]  Thank you, Katrina Webb [facility] [phone] Fax [phone]

## 2014-12-19 NOTE — Progress Notes (Signed)
MEDICARE ANNUAL WELLNESS VISIT AND FOLLOW UP  Assessment:   1. Hypertension - CBC with Differential - BASIC METABOLIC PANEL WITH GFR - Hepatic function panel - Lipid panel  2. Gastroesophageal reflux disease with esophagitis Continue follow up with Dr. Christella HartiganJacobs, continue meds  3. Hypothyroidism, unspecified hypothyroidism type - TSH  4. Type 2 diabetes mellitus with diabetic nephropathy Discussed general issues about diabetes pathophysiology and management., Educational material distributed., Suggested low cholesterol diet., Encouraged aerobic exercise., Discussed foot care., Reminded to get yearly retinal exam. - Hemoglobin A1c - Insulin, fasting - HM DIABETES FOOT EXAM  5. SDAT 25/30 which is much better than her previous MMSE at 19 Continue aricept  6. Hypercalcemia Monitor BMP, no tums.  7. Vitamin D Deficiency - Vit D  25 hydroxy (rtn osteoporosis monitoring)  8. Encounter for long-term (current) use of other medications - Magnesium  9. At high risk for falls Declines PT at this time  10. Atherosclerosis of aorta Decrease sugars, HTN, chol  11. Orthostatic hypotension Monitor BP at home  12. SBO (small bowel obstruction) monitor    Plan:   During the course of the visit the patient was educated and counseled about appropriate screening and preventive services including:    Pneumococcal vaccine   Influenza vaccine  Td vaccine  Screening electrocardiogram  Screening mammography  Bone densitometry screening  Colorectal cancer screening  Diabetes screening  Glaucoma screening  Nutrition counseling   Advanced directives: given info/requested  Screening recommendations, referrals:  Vaccinations: See orders and below  Nutrition assessed and recommended  Colonoscopy not indicated Mammogram declined Pap smear not indicated Pelvic exam not indicated Recommended yearly ophthalmology/optometry visit for glaucoma screening and  checkup Recommended yearly dental visit for hygiene and checkup Advanced directives - declined  Conditions/risks identified: BMI: Discussed weight loss, diet, and increase physical activity.  Increase physical activity: AHA recommends 150 minutes of physical activity a week.  Medications reviewed DEXA- declined Diabetes is at goal, ACE/ARB therapy: Yes. Urinary Incontinence is an issue: discussed non pharmacology and pharmacology options.  Fall risk: high- discussed PT, home fall assessment, medications.    Subjective:   Megan [name] is a 81 y.o. female who presents for Medicare Annual Wellness Visit and 3 month follow up on hypertension, prediabetes, hyperlipidemia, vitamin D def.  Date of last medicare wellness visit is unknown.   Her blood pressure has been controlled at home, today their BP is BP: 140/72 mmHg She does not workout. She denies chest pain, shortness of breath, dizziness.  She is not on cholesterol medication and denies myalgias. Her cholesterol is not at goal. The cholesterol last visit was:   Lab Results  Component Value Date   CHOL 186 09/24/2014   HDL 78 09/24/2014   LDLCALC 97 09/24/2014   TRIG 57 09/24/2014   CHOLHDL 2.4 09/24/2014   She has not been working on diet and exercise for prediabetes, and denies polydipsia and polyuria. Last A1C in the office was:  Lab Results  Component Value Date   HGBA1C 6.0* 11/21/2014   Patient is on Vitamin D supplement. Lab Results  Component Value Date   VD25OH 1862 06/25/2014     She is on thyroid medication. Her medication was not changed last visit. Patient denies nervousness, palpitations and weight changes.  Lab Results  Component Value Date   TSH 2.303 11/21/2014  .  She is a fall risk, walks with a walker, she denies falls in the past year.  She has urinary incontinence.  She is chewing snuff currently, and does this daily.  She states she is having a colonoscopy/EGD in Dec.   Names of Other  Physician/Practitioners you currently use: 1. [facility]- here for primary care 2. [facility], eye doctor, last visit states "not long ago" 3. Sees dentist for new partial dentures.  Patient Care Team: Lucky Cowboy, MD as PCP - General (Internal Medicine) Nestor Lewandowsky, MD as Consulting Physician (Orthopedic Surgery) Rachael Fee, MD as Attending Physician (Gastroenterology) Shayne Alken, MD as Consulting Physician (Physical Medicine and Rehabilitation) Dr. Christella Hartigan, GI Dr. Rowan/ Ramos/ August Saucer, Ortho  Medication Review Current Outpatient Prescriptions on File Prior to Visit  Medication Sig Dispense Refill  . aspirin 325 MG EC tablet Take 325 mg by mouth daily.    . Cholecalciferol (VITAMIN D3) 5000 UNITS CAPS Take 5,000 Units by mouth daily.    . citalopram (CELEXA) 10 MG tablet TAKE 1 TABLET (10 MG TOTAL) BY MOUTH DAILY. 30 tablet 2  . clorazepate (TRANXENE-T) 3.75 MG tablet Take 1 tablet (3.75 mg total) by mouth 3 (three) times daily. 90 tablet 1  . donepezil (ARICEPT) 10 MG tablet Take 1 tablet (10 mg total) by mouth daily. 90 tablet 3  . levothyroxine (SYNTHROID, LEVOTHROID) 50 MCG tablet Take 1 tablet (50 mcg total) by mouth daily before breakfast. 90 tablet 3  . lisinopril (PRINIVIL,ZESTRIL) 40 MG tablet Take 1 tablet (40 mg total) by mouth daily. 30 tablet 0  . pantoprazole (PROTONIX) 40 MG tablet TAKE 1 TABLET DAILY TO PREVENT ULCER 90 tablet 3  . potassium chloride SA (K-DUR,KLOR-CON) 20 MEQ tablet Take 20 mEq by mouth 2 (two) times daily.    . TRAVATAN Z 0.004 % SOLN ophthalmic solution Place 1 drop into both eyes at bedtime.      No current facility-administered medications on file prior to visit.    Current Problems (verified) Patient Active Problem List   Diagnosis Date Noted  . Acute encephalopathy 11/21/2014  . Hypercalcemia 11/21/2014  . Dehydration 11/21/2014  . Hypertensive encephalopathy 11/21/2014  .  Orthostatic hypotension   . At high risk for falls 06/25/2014  . Atherosclerosis of abdominal aorta 06/25/2014  . Hypertension 12/12/2013  . Vitamin D Deficiency 12/12/2013  . GERD (gastroesophageal reflux disease)   . UTI (lower urinary tract infection) 08/25/2013  . SDAT 02/27/2013  . SBO (small bowel obstruction) 05/28/2012  . Acute delirium 04/27/2012  . T2 NIDDM w/Nephropathy 03/25/2012  . Hypothyroidism 03/25/2012    Screening Tests Health Maintenance  Topic Date Due  . OPHTHALMOLOGY EXAM  06/13/1937  . ZOSTAVAX  06/12/1989  . DEXA SCAN  06/12/1994  . TETANUS/TDAP  11/02/2004  . HEMOGLOBIN A1C  05/22/2015  . INFLUENZA VACCINE  06/03/2015  . FOOT EXAM  06/26/2015  . URINE MICROALBUMIN  09/25/2015  . COLONOSCOPY  07/04/2024  . PNEUMOCOCCAL POLYSACCHARIDE VACCINE AGE 42 AND OVER  Completed     Immunization History  Administered Date(s) Administered  . Influenza-Unspecified 09/02/2014  . PPD Test 02/19/2014  . Pneumococcal Polysaccharide-23 04/30/2012  . Td 11/02/1994   Preventative care: Last colonoscopy: 2015 EGD 2015 PUD Last mammogram: 2008 Declines Last pap smear/pelvic exam: 2008- declines another DEXA: Declines CXR 2014 Echo 2013 55-60%, Mild MR  Prior vaccinations: TD or Tdap: 1996 declines  Influenza: declines Pneumococcal: 2013 Prevnar 13: Declines Shingles/Zostavax: declines  History reviewed: allergies, current medications, past family history, past medical history, past social history, past surgical history and problem list  Risk Factors: Osteoporosis: postmenopausal estrogen deficiency and dietary calcium and/or vitamin D deficiency History of fracture in the past year: no  Tobacco History  Substance Use Topics  . Smoking status: Never Smoker   . Smokeless tobacco: Current User    Types: Snuff     Comment: 03/28/12 "use snuff q once in while; not regular"  . Alcohol Use: No   She does not smoke.  Patient is not a former smoker but  uses snuff. Are there smokers in your home (other than you)?  No  Alcohol Current alcohol use: none  Caffeine Current caffeine use: coffee 1 /day  Exercise Current exercise: none  Nutrition/Diet Current diet: in general, an "unhealthy" diet  Cardiac risk factors: advanced age (older than 7 for men, 19 for women), dyslipidemia, family history of premature cardiovascular disease, hypertension, sedentary lifestyle and smoking/ tobacco exposure.  Depression Screen- Since her husband and daughter passed she has been depressed.  (Note: if answer to either of the following is "Yes", a more complete depression screening is indicated)   Q1: Over the past two weeks, have you felt down, depressed or hopeless? Yes  Q2: Over the past two weeks, have you felt little interest or pleasure in doing things? No  Have you lost interest or pleasure in daily life? Yes  Do you often feel hopeless? No  Do you cry easily over simple problems? No  Activities of Daily Living In your present state of health, do you have any difficulty performing the following activities?:  Driving? Yes Managing money?  Yes Feeding yourself? No Getting from bed to chair? Yes Climbing a flight of stairs? Yes Preparing food and eating?: Yes Bathing or showering? Yes Getting dressed: Yes Getting to the toilet? Yes Using the toilet:No Moving around from place to place: Yes In the past year have you fallen or had a near fall?:Not since August   Are you sexually active?  No  Do you have more than one partner?  No  Vision Difficulties: Yes  Hearing Difficulties: Yes  Do you often ask people to speak up or repeat themselves? Yes Do you experience ringing or noises in your ears? No Do you have difficulty understanding soft or whispered voices? Yes  Cognition  Do you feel that you have a problem with memory?Yes  Do you often misplace items? Yes  Do you feel safe at home?  Yes  Advanced directives Does patient have  a Health Care Power of Attorney? No Does patient have a Living Will? No   Objective:   Blood pressure 140/72, pulse 72, temperature 98.3 F (36.8 C), resp. rate 16, height  (1.727 m), weight 119 lb (53.978 kg). Body mass index is 18.1 kg/(m^2).  General appearance: alert, no distress, WD/WN,  female Cognitive Testing  Alert? Yes  Normal Appearance?Yes  Oriented to person? Yes  Place? Yes   Time? Yes  Recall of three objects?  No  Can perform simple calculations? Yes  Displays appropriate judgment?Yes  Can read the correct time from a watch face?No  HEENT: normocephalic, sclerae anicteric, TMs pearly, nares patent, no discharge or erythema, pharynx normal Oral cavity: MMM, no lesions Neck: supple, no lymphadenopathy, no thyromegaly, no masses Heart: RRR, normal S1, S2, no murmurs Lungs: CTA bilaterally, no wheezes, rhonchi, or rales Abdomen: +bs, soft, non tender, non distended, no masses, no hepatomegaly, no splenomegaly Musculoskeletal: nontender, no swelling, no obvious deformity Extremities: no edema, no cyanosis, no clubbing Pulses: 2+ symmetric, upper and lower extremities,  decreased PT, normal cap refill Neurological: alert, oriented x 3, CN2-12 intact, strength 4/5 upper extremities and lower extremities, sensation normal throughout, DTRs 2+ throughout, slow, shuffling gait with walker Psychiatric: normal affect, behavior normal, pleasant  Breast: defer Gyn: defer Rectal: defer  Medicare Attestation I have personally reviewed: The patient's medical and social history Their use of alcohol, tobacco or illicit drugs Their current medications and supplements The patient's functional ability including ADLs,fall risks, home safety risks, cognitive, and hearing and visual impairment Diet and physical activities Evidence for depression or mood disorders  The patient's weight, height, BMI, and visual acuity have been recorded in the chart.  I have made referrals,  counseling, and provided education to the patient based on review of the above and I have provided the patient with a written personalized care plan for preventive services.     Quentin Mulling, PA-C   12/19/2014

## 2014-12-20 LAB — BASIC METABOLIC PANEL WITH GFR
BUN: 14 mg/dL (ref 6–23)
CO2: 28 mEq/L (ref 19–32)
Calcium: 9.4 mg/dL (ref 8.4–10.5)
Chloride: 104 mEq/L (ref 96–112)
Creat: 0.65 mg/dL (ref 0.50–1.10)
GFR, Est African American: >89 mL/min
GFR, Est Non African American: 81 mL/min
GLUCOSE: 78 mg/dL (ref 70–99)
Potassium: 4.3 mEq/L (ref 3.5–5.3)
Sodium: 141 mEq/L (ref 135–145)

## 2014-12-20 LAB — HEPATIC FUNCTION PANEL
ALBUMIN: 3.5 g/dL (ref 3.5–5.2)
ALK PHOS: 71 U/L (ref 39–117)
ALT: 8 U/L (ref 0–35)
AST: 15 U/L (ref 0–37)
Bilirubin, Direct: 0.1 mg/dL (ref 0.0–0.3)
Indirect Bilirubin: 0.5 mg/dL (ref 0.2–1.2)
TOTAL PROTEIN: 6.6 g/dL (ref 6.0–8.3)
Total Bilirubin: 0.6 mg/dL (ref 0.2–1.2)

## 2014-12-20 LAB — LIPID PANEL
Cholesterol: 162 mg/dL (ref 0–200)
HDL: 77 mg/dL (ref >39)
LDL Cholesterol: 70 mg/dL (ref 0–99)
TRIGLYCERIDES: 74 mg/dL (ref <150)
Total CHOL/HDL Ratio: 2.1 Ratio
VLDL: 15 mg/dL (ref 0–40)

## 2014-12-20 LAB — HEMOGLOBIN A1C
HEMOGLOBIN A1C: 6 % — AB (ref <5.7)
Mean Plasma Glucose: 126 mg/dL — ABNORMAL HIGH (ref <117)

## 2014-12-20 LAB — VITAMIN D 25 HYDROXY (VIT D DEFICIENCY, FRACTURES): VIT D 25 HYDROXY: 31 ng/mL (ref 30–100)

## 2014-12-20 LAB — MAGNESIUM: Magnesium: 2 mg/dL (ref 1.5–2.5)

## 2014-12-21 ENCOUNTER — Other Ambulatory Visit: Payer: Self-pay | Admitting: Nurse Practitioner

## 2014-12-21 ENCOUNTER — Other Ambulatory Visit: Payer: Self-pay | Admitting: *Deleted

## 2014-12-21 MED ORDER — LEVOTHYROXINE SODIUM 50 MCG PO TABS: 50.0000 ug | ORAL_TABLET | Freq: Every day | ORAL | Status: DC

## 2014-12-28 ENCOUNTER — Ambulatory Visit: Payer: Self-pay | Admitting: Internal Medicine

## 2015-01-28 ENCOUNTER — Other Ambulatory Visit: Payer: Self-pay

## 2015-01-28 MED ORDER — AMLODIPINE BESYLATE 5 MG PO TABS: ORAL_TABLET | ORAL | Status: DC

## 2015-02-25 ENCOUNTER — Other Ambulatory Visit: Payer: Self-pay | Admitting: *Deleted

## 2015-02-25 MED ORDER — AMLODIPINE BESYLATE 5 MG PO TABS: ORAL_TABLET | ORAL | Status: DC

## 2015-02-27 ENCOUNTER — Other Ambulatory Visit: Payer: Self-pay | Admitting: *Deleted

## 2015-02-27 MED ORDER — LISINOPRIL 40 MG PO TABS: 40.0000 mg | ORAL_TABLET | Freq: Every day | ORAL | Status: DC

## 2015-04-29 ENCOUNTER — Ambulatory Visit: Payer: Medicare Other | Admitting: Internal Medicine

## 2015-04-29 ENCOUNTER — Ambulatory Visit: Payer: Self-pay | Admitting: Internal Medicine

## 2015-04-29 ENCOUNTER — Encounter: Payer: Self-pay | Admitting: Internal Medicine

## 2015-04-29 DIAGNOSIS — R451 Restlessness and agitation: Secondary | ICD-10-CM

## 2015-04-29 DIAGNOSIS — F03918 Unspecified dementia, unspecified severity, with other behavioral disturbance: Secondary | ICD-10-CM | POA: Insufficient documentation

## 2015-04-29 DIAGNOSIS — F0391 Unspecified dementia with behavioral disturbance: Secondary | ICD-10-CM | POA: Insufficient documentation

## 2015-04-29 MED ORDER — HALOPERIDOL 1 MG PO TABS: ORAL_TABLET | ORAL | Status: DC

## 2015-04-29 NOTE — Patient Instructions (Addendum)
Stop Aricept  (Donazepil)  & Celexa (Citalopram)  +++++++++++++++++++++++++++++++++++ Dementia Dementia is a general term for problems with brain function. A person with dementia has memory loss and a hard time with at least one other brain function such as thinking, speaking, or problem solving. Dementia can affect social functioning, how you do your job, your mood, or your personality. The changes may be hidden for a long time. The earliest forms of this disease are usually not detected by family or friends. Dementia can be:  Irreversible.  Potentially reversible.  Partially reversible.  Progressive. This means it can get worse over time. CAUSES  Irreversible dementia causes may include:  Degeneration of brain cells (Alzheimer disease or Lewy body dementia).  Multiple small strokes (vascular dementia).  Infection (chronic meningitis or Creutzfeldt-Jakob disease).  Frontotemporal dementia. This affects younger people, age 66 to 29, compared to those who have Alzheimer disease.  Dementia associated with other disorders like Parkinson disease, Huntington disease, or HIV-associated dementia. Potentially or partially reversible dementia causes may include:  Medicines.  Metabolic causes such as excessive alcohol intake, vitamin B12 deficiency, or thyroid disease.  Masses or pressure in the brain such as a tumor, blood clot, or hydrocephalus. SIGNS AND SYMPTOMS  Symptoms are often hard to detect. Family members or coworkers may not notice them early in the disease process. Different people with dementia may have different symptoms. Symptoms can include:  A hard time with memory, especially recent memory. Long-term memory may not be impaired.  Asking the same question multiple times or forgetting something someone just said.  A hard time speaking your thoughts or finding certain words.  A hard time solving problems or performing familiar tasks (such as how to use a  telephone).  Sudden changes in mood.  Changes in personality, especially increasing moodiness or mistrust.  Depression.  A hard time understanding complex ideas that were never a problem in the past. DIAGNOSIS  There are no specific tests for dementia.   Your health care provider may recommend a thorough evaluation. This is because some forms of dementia can be reversible. The evaluation will likely include a physical exam and getting a detailed history from you and a family member. The history often gives the best clues and suggestions for a diagnosis.  Memory testing may be done. A detailed brain function evaluation called neuropsychologic testing may be helpful.  Lab tests and brain imaging (such as a CT scan or MRI scan) are sometimes important.  Sometimes observation and re-evaluation over time is very helpful. TREATMENT  Treatment depends on the cause.   If the problem is a vitamin deficiency, it may be helped or cured with supplements.  For dementias such as Alzheimer disease, medicines are available to stabilize or slow the course of the disease. There are no cures for this type of dementia.  Your health care provider can help direct you to groups, organizations, and other health care providers to help with decisions in the care of you or your loved one. HOME CARE INSTRUCTIONS The care of individuals with dementia is varied and dependent upon the progression of the dementia. The following suggestions are intended for the person living with, or caring for, the person with dementia.  Create a safe environment.  Remove the locks on bathroom doors to prevent the person from accidentally locking himself or herself in.  Use childproof latches on kitchen cabinets and any place where cleaning supplies, chemicals, or alcohol are kept.  Use childproof covers in unused electrical  outlets.  Install childproof devices to keep doors and windows secured.  Remove stove knobs or  install safety knobs and an automatic shut-off on the stove.  Lower the temperature on water heaters.  Label medicines and keep them locked up.  Secure knives, lighters, matches, power tools, and guns, and keep these items out of reach.  Keep the house free from clutter. Remove rugs or anything that might contribute to a fall.  Remove objects that might break and hurt the person.  Make sure lighting is good, both inside and outside.  Install grab rails as needed.  Use a monitoring device to alert you to falls or other needs for help.  Reduce confusion.  Keep familiar objects and people around.  Use night lights or dim lights at night.  Label items or areas.  Use reminders, notes, or directions for daily activities or tasks.  Keep a simple, consistent routine for waking, meals, bathing, dressing, and bedtime.  Create a calm, quiet environment.  Place large clocks and calendars prominently.  Display emergency numbers and home address near all telephones.  Use cues to establish different times of the day. An example is to open curtains to let the natural light in during the day.   Use effective communication.  Choose simple words and short sentences.  Use a gentle, calm tone of voice.  Be careful not to interrupt.  If the person is struggling to find a word or communicate a thought, try to provide the word or thought.  Ask one question at a time. Allow the person ample time to answer questions. Repeat the question again if the person does not respond.  Reduce nighttime restlessness.  Provide a comfortable bed.  Have a consistent nighttime routine.  Ensure a regular walking or physical activity schedule. Involve the person in daily activities as much as possible.  Limit napping during the day.  Limit caffeine.  Attend social events that stimulate rather than overwhelm the senses.  Encourage good nutrition and hydration.  Reduce distractions during meal  times and snacks.  Avoid foods that are too hot or too cold.  Monitor chewing and swallowing ability.  Continu    e with routine vision, hearing, dental, and medical screenings.  Give medicines only as directed by the health care provider.  Monitor driving abilities. Do not allow the person to drive when safe driving is no longer possible.  Register with an identification program which could provide location assistance in the event of a missing person situation. SEEK MEDICAL CARE IF:   New behavioral problems start such as moodiness, aggressiveness, or seeing things that are not there (hallucinations).  Any new problem with brain function happens. This includes problems with balance, speech, or falling a lot.  Problems with swallowing develop.  Any symptoms of other illness happen. Small changes or worsening in any aspect of brain function can be a sign that the illness is getting worse. It can also be a sign of another medical illness such as infection. Seeing a health care provider right away is important. SEEK IMMEDIATE MEDICAL CARE IF:   A fever develops.  New or worsened confusion develops.  New or worsened sleepiness develops.  Staying awake becomes hard to do.   +++++++++++++++++++++++++++++++++ Paranoia Paranoia is a distrust of others that is not based on a real reason for distrust. This may reach delusional levels. This means the paranoid person feels the world is against them when there is no reason to make them feel that  way. People with paranoia feel as though people around them are "out to get them".  SIMILAR MENTAL ILNESSES  Depression is a feeling as though you are down all the time. It is normal in some situations where you have just lost a loved one. It is abnormal if you are having feelings of paranoia with it.  Dementia is a physical problem with the brain in which the brain no longer works properly. There are problems with daily activities of living.  Alzheimer's disease is one example of this. Dementia is also caused by old age changes in the brain which come with the death of brain cells and small strokes.  Paranoidschizophrenia. People with paranoid schizophrenia and persecutory delusional disorder have delusions in which they feel people around them are plotting against them. Persecutory delusions in paranoid schizophrenia are bizarre, sometimes grandiose, and often accompanied by auditory hallucinations. This means the person is hearing voices that are not there.  Delusionaldisorder (persecutory type). Delusions experienced by individuals with delusional disorder are more believable than those experienced by paranoid schizophrenics; they are not bizarre, though still unjustified. Individuals with delusional disorder may seem offbeat or quirky rather than mentally ill, and therefore, may never seek treatment. All of these problems usually do not allow these people to interact socially in an acceptable manner. CAUSES The cause of paranoia is often not known. It is common in people with extended abuse of:  Cocaine.  Amphetamine.  Marijuana.  Alcohol. Sometimes there is an inherited tendency. It may be associated with stress or changes in brain chemistry. DIAGNOSIS  When paranoia is present, your caregiver may:  Refer you to a specialist.  Do a physical exam.  Perform other tests on you to make sure there are not other problems causing the paranoia including:  Physical problems.  Mental problems.  Chemical problems (other than drugs). Testing may be done to determine if there is a psychiatric disability present that can be treated with medicine. TREATMENT   Paranoia that is a symptom of a psychiatric problem should be treated by professionals.  Medicines are available which can help this disorder. Antipsychotic medicine may be prescribed by your caregiver.  Sometimes psychotherapy may be useful.  Conditions such as  depression or drug abuse are treated individually. If the paranoia is caused by drug abuse, a treatment facility may be helpful. Depression may be helped by antidepressants. PROGNOSIS   Paranoid people are difficult to treat because of their belief that everyone is out to get them or harm them. Because of this mistrust, they often must be talked into entering treatment by a trusted family member or friend. They may not want to take medicine as they may see this as an attempt to poison them.  Gradual gains in the trust of a therapist or caregiver helps in a successful treatment plan.  Some people with PPD or persecutory delusional disorder function in society without treatment in limited fashion.

## 2015-04-29 NOTE — Progress Notes (Signed)
Patient ID: Megan CapriceHester R Greeson, female   DOB: Nov 04, 1926, 81 y.o.   MRN: [id number]     Two of Patient's daughters , Stephania Fragminvelyn Ratliffe and Patrina LeveringFrances Woods presented for Marvel PlanHester Hiemstra' appointment today relating that Her dementia seems to be worsening with increased agitation and confusion and frequently refusing to take meds , bathe, eat meals. Also noted that Britzy refused to present today for her appointment. There was a family friend who had functioned as her 24 hr caretaker and she "ran him off and put him out" and now the various family members are trying to visit frequently to provide meals stating that she will not prepare meals for herself. Family suspect that she is incontinent of urine from a uriniferous BO. Further they relate that she not only is easily agitated, that she also seems defiant and oppositional. They also report that apparently she is delusional. They recounted that she has had admissions in the last year for dehydration.     Discussed with daughters a trial with Haldol low dose discussing slow titration and discussed anticipated effects and side-effects. Reiterated that if her sensorium deteriorated or dehydration suspected to take to [facility] for evaluation. Aklso discussed that they might have to take out papers at the magistrate's office at the courthouse to have her evaluated at the ER for comitment  / Over 50 minutes of  counseling, chart review and complex critical decision making was performed in discussing medicines and dose titration with daughters.    1. Dementia with behavioral disturbance    2. Agitation requiring sedation protocol  - haloperidol (HALDOL) 1 MG tablet; Take 1 to 3 tablets 3 x daily to control agitation  Dispense: 90 tablet; Refill: 1

## 2015-05-08 ENCOUNTER — Other Ambulatory Visit: Payer: Self-pay | Admitting: Emergency Medicine

## 2015-09-02 ENCOUNTER — Other Ambulatory Visit: Payer: Self-pay | Admitting: Internal Medicine

## 2015-09-09 ENCOUNTER — Other Ambulatory Visit: Payer: Self-pay | Admitting: Emergency Medicine

## 2015-09-09 ENCOUNTER — Other Ambulatory Visit: Payer: Self-pay | Admitting: Internal Medicine

## 2015-10-01 ENCOUNTER — Ambulatory Visit: Payer: Medicare Other | Admitting: Internal Medicine

## 2015-10-01 NOTE — Progress Notes (Signed)
Patient ID: Megan [name], female   DOB: 05/14/1927, 81 y.o.   MRN: [id number] [place]  O      S  H  O  W

## 2015-10-13 ENCOUNTER — Other Ambulatory Visit: Payer: Self-pay | Admitting: Physician Assistant

## 2015-10-16 ENCOUNTER — Other Ambulatory Visit: Payer: Self-pay | Admitting: Physician Assistant

## 2015-10-20 ENCOUNTER — Other Ambulatory Visit: Payer: Self-pay | Admitting: Internal Medicine

## 2015-10-20 MED ORDER — HALOPERIDOL 1 MG PO TABS: ORAL_TABLET | ORAL | Status: DC

## 2015-10-28 ENCOUNTER — Other Ambulatory Visit: Payer: Self-pay | Admitting: Physician Assistant

## 2015-11-13 ENCOUNTER — Other Ambulatory Visit: Payer: Self-pay | Admitting: Internal Medicine

## 2015-11-15 ENCOUNTER — Emergency Department (HOSPITAL_COMMUNITY): Payer: Medicare Other

## 2015-11-15 ENCOUNTER — Inpatient Hospital Stay (HOSPITAL_COMMUNITY)
Admission: EM | Admit: 2015-11-15 | Discharge: 2015-11-18 | DRG: 689 | Disposition: A | Discharge status: Skilled Nursing Facility | Payer: Medicare Other | Attending: Internal Medicine | Admitting: Internal Medicine

## 2015-11-15 ENCOUNTER — Encounter (HOSPITAL_COMMUNITY): Payer: Self-pay | Admitting: Emergency Medicine

## 2015-11-15 DIAGNOSIS — K219 Gastro-esophageal reflux disease without esophagitis: Secondary | ICD-10-CM | POA: Diagnosis present

## 2015-11-15 DIAGNOSIS — I10 Essential (primary) hypertension: Secondary | ICD-10-CM | POA: Diagnosis present

## 2015-11-15 DIAGNOSIS — F03918 Unspecified dementia, unspecified severity, with other behavioral disturbance: Secondary | ICD-10-CM | POA: Diagnosis present

## 2015-11-15 DIAGNOSIS — Z720 Tobacco use: Secondary | ICD-10-CM

## 2015-11-15 DIAGNOSIS — Z23 Encounter for immunization: Secondary | ICD-10-CM

## 2015-11-15 DIAGNOSIS — N390 Urinary tract infection, site not specified: Secondary | ICD-10-CM | POA: Diagnosis not present

## 2015-11-15 DIAGNOSIS — E43 Unspecified severe protein-calorie malnutrition: Secondary | ICD-10-CM | POA: Insufficient documentation

## 2015-11-15 DIAGNOSIS — E860 Dehydration: Secondary | ICD-10-CM | POA: Diagnosis present

## 2015-11-15 DIAGNOSIS — R531 Weakness: Secondary | ICD-10-CM | POA: Diagnosis not present

## 2015-11-15 DIAGNOSIS — R627 Adult failure to thrive: Secondary | ICD-10-CM | POA: Diagnosis present

## 2015-11-15 DIAGNOSIS — E876 Hypokalemia: Secondary | ICD-10-CM | POA: Insufficient documentation

## 2015-11-15 DIAGNOSIS — Z7982 Long term (current) use of aspirin: Secondary | ICD-10-CM

## 2015-11-15 DIAGNOSIS — E7800 Pure hypercholesterolemia, unspecified: Secondary | ICD-10-CM | POA: Diagnosis present

## 2015-11-15 DIAGNOSIS — E039 Hypothyroidism, unspecified: Secondary | ICD-10-CM | POA: Diagnosis present

## 2015-11-15 DIAGNOSIS — Z79899 Other long term (current) drug therapy: Secondary | ICD-10-CM

## 2015-11-15 DIAGNOSIS — F0391 Unspecified dementia with behavioral disturbance: Secondary | ICD-10-CM | POA: Diagnosis present

## 2015-11-15 DIAGNOSIS — Z681 Body mass index (BMI) 19 or less, adult: Secondary | ICD-10-CM

## 2015-11-15 DIAGNOSIS — M1990 Unspecified osteoarthritis, unspecified site: Secondary | ICD-10-CM | POA: Diagnosis present

## 2015-11-15 DIAGNOSIS — E119 Type 2 diabetes mellitus without complications: Secondary | ICD-10-CM | POA: Diagnosis present

## 2015-11-15 DIAGNOSIS — D696 Thrombocytopenia, unspecified: Secondary | ICD-10-CM | POA: Diagnosis present

## 2015-11-15 LAB — URINE MICROSCOPIC-ADD ON

## 2015-11-15 LAB — COMPREHENSIVE METABOLIC PANEL
ALBUMIN: 2.8 g/dL — AB (ref 3.5–5.0)
ALK PHOS: 65 U/L (ref 38–126)
ALT: 10 U/L — AB (ref 14–54)
ANION GAP: 11 (ref 5–15)
AST: 21 U/L (ref 15–41)
BILIRUBIN TOTAL: 1.9 mg/dL — AB (ref 0.3–1.2)
BUN: 18 mg/dL (ref 6–20)
CALCIUM: 9.7 mg/dL (ref 8.9–10.3)
CO2: 33 mmol/L — AB (ref 22–32)
CREATININE: 0.76 mg/dL (ref 0.44–1.00)
Chloride: 101 mmol/L (ref 101–111)
GFR calc Af Amer: >60 mL/min (ref >60)
GFR calc non Af Amer: >60 mL/min (ref >60)
GLUCOSE: 146 mg/dL — AB (ref 65–99)
Potassium: 2.9 mmol/L — ABNORMAL LOW (ref 3.5–5.1)
SODIUM: 145 mmol/L (ref 135–145)
TOTAL PROTEIN: 6.7 g/dL (ref 6.5–8.1)

## 2015-11-15 LAB — DIFFERENTIAL
BASOS ABS: 0 10*3/uL (ref 0.0–0.1)
Basophils Relative: 0 %
Eosinophils Absolute: 0 10*3/uL (ref 0.0–0.7)
Eosinophils Relative: 0 %
LYMPHS ABS: 1.7 10*3/uL (ref 0.7–4.0)
LYMPHS PCT: 18 %
Monocytes Absolute: 0.8 10*3/uL (ref 0.1–1.0)
Monocytes Relative: 9 %
NEUTROS ABS: 6.8 10*3/uL (ref 1.7–7.7)
NEUTROS PCT: 73 %

## 2015-11-15 LAB — URINALYSIS, ROUTINE W REFLEX MICROSCOPIC
Glucose, UA: NEGATIVE mg/dL
KETONES UR: NEGATIVE mg/dL
NITRITE: NEGATIVE
PH: 5.5 (ref 5.0–8.0)
PROTEIN: 100 mg/dL — AB
Specific Gravity, Urine: 1.016 (ref 1.005–1.030)

## 2015-11-15 LAB — RAPID URINE DRUG SCREEN, HOSP PERFORMED
AMPHETAMINES: NOT DETECTED
BARBITURATES: NOT DETECTED
Benzodiazepines: NOT DETECTED
Cocaine: NOT DETECTED
OPIATES: NOT DETECTED
TETRAHYDROCANNABINOL: NOT DETECTED

## 2015-11-15 LAB — CBC
HCT: 42.3 % (ref 36.0–46.0)
HEMOGLOBIN: 13.9 g/dL (ref 12.0–15.0)
MCH: 30.2 pg (ref 26.0–34.0)
MCHC: 32.9 g/dL (ref 30.0–36.0)
MCV: 92 fL (ref 78.0–100.0)
PLATELETS: 127 10*3/uL — AB (ref 150–400)
RBC: 4.6 MIL/uL (ref 3.87–5.11)
RDW: 14.3 % (ref 11.5–15.5)
WBC: 9.4 10*3/uL (ref 4.0–10.5)

## 2015-11-15 LAB — APTT: aPTT: 31 seconds (ref 24–37)

## 2015-11-15 LAB — I-STAT TROPONIN, ED: Troponin i, poc: 0.08 ng/mL (ref 0.00–0.08)

## 2015-11-15 LAB — PROTIME-INR
INR: 1.14 (ref 0.00–1.49)
PROTHROMBIN TIME: 14.8 s (ref 11.6–15.2)

## 2015-11-15 LAB — ETHANOL: Alcohol, Ethyl (B): <5 mg/dL (ref <5)

## 2015-11-15 MED ORDER — SODIUM CHLORIDE 0.9 % IV SOLN
INTRAVENOUS | Status: DC
  Administered 2015-11-15 – 2015-11-18 (×6): via INTRAVENOUS

## 2015-11-15 MED ORDER — POTASSIUM CHLORIDE CRYS ER 20 MEQ PO TBCR: 40.0000 meq | EXTENDED_RELEASE_TABLET | Freq: Once | ORAL | Status: DC

## 2015-11-15 MED ORDER — DEXTROSE 5 % IV SOLN
1.0000 g | Freq: Once | INTRAVENOUS | Status: AC
  Administered 2015-11-16: 1 g via INTRAVENOUS
  Filled 2015-11-15: qty 10

## 2015-11-15 NOTE — ED Notes (Signed)
Family at bedside. 

## 2015-11-15 NOTE — ED Notes (Signed)
Pt from home via EMS for generalized weakness, fatigue, lethargy, and poor appetite x 5 days per family report; PMHx of dementia; Pt denies pain n/v or fever; EMS reports pt is in Afib with occasional PVCs; family on scene reported "Strong scented urine"; VSS enroute to hospital, no interventions performed by EMS

## 2015-11-15 NOTE — ED Provider Notes (Signed)
CSN: [id number]     Arrival date & time 11/15/15  1938 History   First MD Initiated Contact with Patient 11/15/15 2018     Chief Complaint  Patient presents with  . Fatigue  . Weakness  . Failure To Thrive    HPI Family states that patient has not had much appetite and has not been eating well over the last several days.  She has to use a walker to walk and she has not been doing well with that over the last couple of weeks.  No nausea or vomiting.  No fevers or chills.  No chest pain.  Family does not know if her speech is slurred bc she is not talking much.  They brought her in this week because of her increasing weakness. Past Medical History  Diagnosis Date  . Hypertension   . Diastolic dysfunction, left ventricle by ECHO 2011 03/25/2012  . Hypothyroidism 03/25/2012  . High cholesterol   . Type II diabetes mellitus (HCC)   . H/O hiatal hernia   . Duodenal perforation (HCC) 03/23/12  . Arthritis     "in my back"  . GERD (gastroesophageal reflux disease)   . Dementia    Past Surgical History  Procedure Laterality Date  . Bladder suspension    . Repair perforated ulcer  03/23/12  . Diagnostic laparoscopy  03/25/12    REPAIR OF PERFORATED ULCER with omental patch  . Abcess drainage  03/25/12     of abdominal & pelvic abscesses  . Laparoscopy  03/24/2012    Procedure: LAPAROSCOPY DIAGNOSTIC;  Surgeon: Ardeth Sportsman, MD;  Location: [facility] OR;  Service: General;  Laterality: N/A;  drainage of abdominal and pelvic abcesses  . Laparotomy  06/03/2012    Procedure: EXPLORATORY LAPAROTOMY;  Surgeon: Mariella Saa, MD;  Location[facility];  Service: General;  Laterality: N/A;   Family History  Problem Relation Age of Onset  . Hypertension Mother   . Hypertension Father   . Colon cancer Neg Hx   . Pancreatic cancer Neg Hx    Social History  Substance Use Topics  . Smoking status: Never Smoker   . Smokeless tobacco: Current User    Types: Snuff     Comment: 03/28/12 "use snuff q once in  while; not regular"  . Alcohol Use: No   OB History    No data available     Review of Systems  Constitutional: Negative for fever.  Respiratory: Negative for cough.   Genitourinary: Negative for dysuria.       Urine smells strong  All other systems reviewed and are negative.     Allergies  Cardura and Nsaids  Home Medications   Prior to Admission medications   Medication Sig Start Date End Date Taking? Authorizing Provider  amLODipine (NORVASC) 5 MG tablet TAKE 1 TABLET (5 MG TOTAL) BY MOUTH DAILY. 02/25/15  Yes Lucky Cowboy, MD  aspirin 325 MG EC tablet Take 325 mg by mouth daily.   Yes Historical Provider, MD  Cholecalciferol (VITAMIN D3) 5000 UNITS CAPS Take 5,000 Units by mouth daily.   Yes Historical Provider, MD  clorazepate (TRANXENE-T) 3.75 MG tablet Take 1 tablet (3.75 mg total) by mouth 3 (three) times daily. 12/19/14  Yes Quentin Mulling, PA-C  donepezil (ARICEPT) 10 MG tablet TAKE 1 TABLET (10 MG TOTAL) BY MOUTH DAILY. 05/08/15  Yes Lucky Cowboy, MD  haloperidol (HALDOL) 1 MG tablet Take 1 to 3 tablets/day if needed to control Agitation - need office  visit before refill 10/20/15  Yes Lucky CowboyWilliam McKeown, MD  KLOR-CON M20 20 MEQ tablet TAKE 1 TABLET BY MOUTH TWICE A DAY 09/09/15  Yes Lucky CowboyWilliam McKeown, MD  levothyroxine (SYNTHROID, LEVOTHROID) 50 MCG tablet Take 1 tablet (50 mcg total) by mouth daily before breakfast. 12/21/14  Yes Lucky CowboyWilliam McKeown, MD  lisinopril (PRINIVIL,ZESTRIL) 40 MG tablet TAKE 1 TABLET (40 MG TOTAL) BY MOUTH DAILY. 09/02/15  Yes Lucky CowboyWilliam McKeown, MD  pantoprazole (PROTONIX) 40 MG tablet TAKE 1 TABLET DAILY TO PREVENT ULCER 12/19/14  Yes Quentin MullingAmanda Collier, PA-C  TRAVATAN Z 0.004 % SOLN ophthalmic solution Place 1 drop into both eyes at bedtime.  04/13/14  Yes Historical Provider, MD   BP 160/68 mmHg  Pulse 59  Temp(Src) 98.5 F (36.9 C) (Oral)  Resp 22  SpO2 98% Physical Exam  Constitutional: No distress.  Elderly frail   HENT:  Head: Normocephalic  and atraumatic.  Right Ear: External ear normal.  Left Ear: External ear normal.  Eyes: Conjunctivae are normal. Right eye exhibits no discharge. Left eye exhibits no discharge. No scleral icterus.  Neck: Neck supple. No tracheal deviation present.  Cardiovascular: Normal rate, regular rhythm and intact distal pulses.   Pulmonary/Chest: Effort normal and breath sounds normal. No stridor. No respiratory distress. She has no wheezes. She has no rales.  Abdominal: Soft. Bowel sounds are normal. She exhibits no distension. There is no tenderness. There is no rebound and no guarding.  Musculoskeletal: She exhibits no edema or tenderness.  Neurological: She is alert. She displays no tremor. No cranial nerve deficit (no facial droop, extraocular movements intact, no slurred speech ) or sensory deficit. She exhibits normal muscle tone. She displays no seizure activity. Coordination normal. GCS eye subscore is 4. GCS motor subscore is 6.  4/5 upper extremity, difficulty lifting either leg off the bed, normal sensation,   Skin: Skin is warm and dry. No rash noted.  Psychiatric: She has a normal mood and affect.  Nursing note and vitals reviewed.   ED Course  Procedures (including critical care time) Labs Review Labs Reviewed  CBC - Abnormal; Notable for the following:    Platelets 127 (*)    All other components within normal limits  COMPREHENSIVE METABOLIC PANEL - Abnormal; Notable for the following:    Potassium 2.9 (*)    CO2 33 (*)    Glucose, Bld 146 (*)    Albumin 2.8 (*)    ALT 10 (*)    Total Bilirubin 1.9 (*)    All other components within normal limits  URINALYSIS, ROUTINE W REFLEX MICROSCOPIC (NOT AT [facility]) - Abnormal; Notable for the following:    Color, Urine AMBER (*)    APPearance TURBID (*)    Hgb urine dipstick LARGE (*)    Bilirubin Urine SMALL (*)    Protein, ur 100 (*)    Leukocytes, UA LARGE (*)    All other components within normal limits  URINE MICROSCOPIC-ADD ON -  Abnormal; Notable for the following:    Squamous Epithelial / LPF 0-5 (*)    Bacteria, UA MANY (*)    Casts HYALINE CASTS (*)    All other components within normal limits  URINE CULTURE  ETHANOL  PROTIME-INR  APTT  DIFFERENTIAL  URINE RAPID DRUG SCREEN, HOSP PERFORMED  I-STAT TROPOININ, ED    Imaging Review Dg Chest 2 View  11/15/2015  CLINICAL DATA:  Acute onset of generalized weakness, fatigue and lethargy. Poor appetite. Initial encounter. EXAM: CHEST  2 VIEW COMPARISON:  Chest radiograph performed 11/21/2014 FINDINGS: The lungs are well-aerated and clear. There is no evidence of focal opacification, pleural effusion or pneumothorax. The heart is borderline enlarged. No acute osseous abnormalities are seen. IMPRESSION: Borderline cardiomegaly.  Lungs remain grossly clear. Electronically Signed   By: Roanna Raider M.D.   On: 11/15/2015 21:23   Ct Head Wo Contrast  11/15/2015  CLINICAL DATA:  Weakness and fatigue.  Hypertension and dementia. EXAM: CT HEAD WITHOUT CONTRAST TECHNIQUE: Contiguous axial images were obtained from the base of the skull through the vertex without intravenous contrast. COMPARISON:  11/21/2014 FINDINGS: There is no evidence of intracranial hemorrhage, brain edema, or other signs of acute infarction. There is no evidence of intracranial mass lesion or mass effect. No abnormal extraaxial fluid collections are identified. Mild to moderate cerebral atrophy is unchanged. Extensive chronic small vessel disease shows no significant change. Ventricles stable in size. No skull abnormality identified. Incidental note is made of hyperostosis frontalis interna. IMPRESSION: No acute intracranial findings. Stable cerebral atrophy and chronic small vessel disease. Electronically Signed   By: Myles Rosenthal M.D.   On: 11/15/2015 21:37   I have personally reviewed and evaluated these images and lab results as part of my medical decision-making.   EKG Interpretation   Date/Time:   Friday November 15 2015 21:36:50 EST Ventricular Rate:  62 PR Interval:  175 QRS Duration: 84 QT Interval:  479 QTC Calculation: 486 R Axis:   57 Text Interpretation:  Sinus rhythm Atrial premature complex Probable left  atrial enlargement Probable LVH with secondary repol abnrm Borderline  prolonged QT interval Confirmed by Corianna Avallone  MD-J, Newman Waren ([id number]) on 11/15/2015  9:51:44 PM     Medications  0.9 %  sodium chloride infusion ( Intravenous New Bag/Given 11/15/15 2143)  cefTRIAXone (ROCEPHIN) 1 g in dextrose 5 % 50 mL IVPB (not administered)  potassium chloride SA (K-DUR,KLOR-CON) CR tablet 40 mEq (not administered)    MDM   Final diagnoses:  UTI (lower urinary tract infection)  Hypokalemia    Family brought the patient because of increasing weakness and poor by mouth intake. Laboratory tests show a urinary tract infection.  She also has hypokalemia but no evidence of severe dehydration.  CT scan of the brain does not show any acute abnormalities. Chest x-ray is unremarkable.  I suspect her symptoms are related to urinary tract infection. I have ordered IV Rocephin. I will consult the medical service for admission.    Linwood Dibbles, MD 11/15/15 [phone]

## 2015-11-16 ENCOUNTER — Encounter (HOSPITAL_COMMUNITY): Payer: Self-pay | Admitting: *Deleted

## 2015-11-16 DIAGNOSIS — I10 Essential (primary) hypertension: Secondary | ICD-10-CM | POA: Diagnosis not present

## 2015-11-16 DIAGNOSIS — E43 Unspecified severe protein-calorie malnutrition: Secondary | ICD-10-CM | POA: Insufficient documentation

## 2015-11-16 DIAGNOSIS — F0391 Unspecified dementia with behavioral disturbance: Secondary | ICD-10-CM | POA: Diagnosis not present

## 2015-11-16 DIAGNOSIS — N390 Urinary tract infection, site not specified: Principal | ICD-10-CM

## 2015-11-16 DIAGNOSIS — E876 Hypokalemia: Secondary | ICD-10-CM | POA: Insufficient documentation

## 2015-11-16 LAB — GLUCOSE, CAPILLARY
GLUCOSE-CAPILLARY: 119 mg/dL — AB (ref 65–99)
GLUCOSE-CAPILLARY: 167 mg/dL — AB (ref 65–99)
GLUCOSE-CAPILLARY: 154 mg/dL — AB (ref 65–99)
Glucose-Capillary: 119 mg/dL — ABNORMAL HIGH (ref 65–99)
Glucose-Capillary: 121 mg/dL — ABNORMAL HIGH (ref 65–99)

## 2015-11-16 LAB — TSH: TSH: 2.658 u[IU]/mL (ref 0.350–4.500)

## 2015-11-16 MED ORDER — ENSURE ENLIVE PO LIQD
237.0000 mL | Freq: Two times a day (BID) | ORAL | Status: DC
  Administered 2015-11-16: 237 mL via ORAL

## 2015-11-16 MED ORDER — PANTOPRAZOLE SODIUM 40 MG PO TBEC
40.0000 mg | DELAYED_RELEASE_TABLET | Freq: Every day | ORAL | Status: DC
  Administered 2015-11-16 – 2015-11-18 (×3): 40 mg via ORAL
  Filled 2015-11-16 (×3): qty 1

## 2015-11-16 MED ORDER — LATANOPROST 0.005 % OP SOLN
1.0000 [drp] | Freq: Every day | OPHTHALMIC | Status: DC
  Administered 2015-11-16 – 2015-11-17 (×2): 1 [drp] via OPHTHALMIC
  Filled 2015-11-16: qty 2.5

## 2015-11-16 MED ORDER — CLORAZEPATE DIPOTASSIUM 3.75 MG PO TABS
3.7500 mg | ORAL_TABLET | Freq: Three times a day (TID) | ORAL | Status: DC
  Administered 2015-11-16 – 2015-11-18 (×8): 3.75 mg via ORAL
  Filled 2015-11-16 (×8): qty 1

## 2015-11-16 MED ORDER — ASPIRIN EC 325 MG PO TBEC
325.0000 mg | DELAYED_RELEASE_TABLET | Freq: Every day | ORAL | Status: DC
  Administered 2015-11-16 – 2015-11-18 (×3): 325 mg via ORAL
  Filled 2015-11-16 (×3): qty 1

## 2015-11-16 MED ORDER — AMLODIPINE BESYLATE 5 MG PO TABS
5.0000 mg | ORAL_TABLET | Freq: Every day | ORAL | Status: DC
  Administered 2015-11-16 – 2015-11-18 (×3): 5 mg via ORAL
  Filled 2015-11-16 (×3): qty 1

## 2015-11-16 MED ORDER — POTASSIUM CHLORIDE CRYS ER 20 MEQ PO TBCR: 20.0000 meq | EXTENDED_RELEASE_TABLET | Freq: Two times a day (BID) | ORAL | Status: DC

## 2015-11-16 MED ORDER — LISINOPRIL 40 MG PO TABS
40.0000 mg | ORAL_TABLET | Freq: Every day | ORAL | Status: DC
  Administered 2015-11-16 – 2015-11-18 (×3): 40 mg via ORAL
  Filled 2015-11-16 (×3): qty 1

## 2015-11-16 MED ORDER — HALOPERIDOL 1 MG PO TABS
1.0000 mg | ORAL_TABLET | Freq: Three times a day (TID) | ORAL | Status: DC | PRN
  Filled 2015-11-16: qty 1

## 2015-11-16 MED ORDER — POTASSIUM CHLORIDE CRYS ER 20 MEQ PO TBCR
40.0000 meq | EXTENDED_RELEASE_TABLET | ORAL | Status: AC
  Administered 2015-11-16 (×2): 40 meq via ORAL
  Filled 2015-11-16 (×2): qty 2

## 2015-11-16 MED ORDER — DONEPEZIL HCL 10 MG PO TABS
10.0000 mg | ORAL_TABLET | Freq: Every day | ORAL | Status: DC
  Administered 2015-11-16 – 2015-11-17 (×2): 10 mg via ORAL
  Filled 2015-11-16 (×2): qty 1

## 2015-11-16 MED ORDER — INFLUENZA VAC SPLIT QUAD 0.5 ML IM SUSY
0.5000 mL | PREFILLED_SYRINGE | INTRAMUSCULAR | Status: AC
Start: 2015-11-17 — End: 2015-11-18
  Administered 2015-11-18: 0.5 mL via INTRAMUSCULAR
  Filled 2015-11-16: qty 0.5

## 2015-11-16 MED ORDER — VITAMIN D 1000 UNITS PO TABS
5000.0000 [IU] | ORAL_TABLET | Freq: Every day | ORAL | Status: DC
Start: 2015-11-16 — End: 2015-11-18
  Administered 2015-11-16 – 2015-11-18 (×3): 5000 [IU] via ORAL
  Filled 2015-11-16 (×3): qty 5

## 2015-11-16 MED ORDER — ENOXAPARIN SODIUM 40 MG/0.4ML ~~LOC~~ SOLN
40.0000 mg | SUBCUTANEOUS | Status: DC
  Administered 2015-11-16 – 2015-11-18 (×3): 40 mg via SUBCUTANEOUS
  Filled 2015-11-16 (×2): qty 0.4

## 2015-11-16 MED ORDER — LEVOTHYROXINE SODIUM 50 MCG PO TABS
50.0000 ug | ORAL_TABLET | Freq: Every day | ORAL | Status: DC
  Administered 2015-11-16 – 2015-11-18 (×3): 50 ug via ORAL
  Filled 2015-11-16 (×3): qty 1

## 2015-11-16 MED ORDER — DEXTROSE 5 % IV SOLN
1.0000 g | INTRAVENOUS | Status: DC
  Administered 2015-11-16 – 2015-11-17 (×2): 1 g via INTRAVENOUS
  Filled 2015-11-16 (×4): qty 10

## 2015-11-16 MED ORDER — ENSURE ENLIVE PO LIQD
237.0000 mL | Freq: Three times a day (TID) | ORAL | Status: DC
  Administered 2015-11-16 – 2015-11-18 (×5): 237 mL via ORAL

## 2015-11-16 NOTE — Progress Notes (Signed)
Called ER RN for report. Room ready.  

## 2015-11-16 NOTE — Progress Notes (Signed)
Initial Nutrition Assessment  DOCUMENTATION CODES:   Severe malnutrition in context of chronic illness, Underweight  INTERVENTION:  Provide Ensure Enlive po TID, each supplement provides 350 kcal and 20 grams of protein.  Encourage adequate PO intake.   NUTRITION DIAGNOSIS:   Malnutrition related to chronic illness as evidenced by severe depletion of body fat, severe depletion of muscle mass.  GOAL:   Patient will meet greater than or equal to 90% of their needs  MONITOR:   PO intake, Supplement acceptance, Weight trends, Labs, I & O's  REASON FOR ASSESSMENT:   Consult Poor PO  ASSESSMENT:   82 y.o. female with h/o dementia, patient has been doing very poorly over the past couple of days. She normally uses a walker to walk but hasnt been doing well with that over past couple of days. No N/V, has significantly decreased PO intake, significantly decreased speech from baseline as well. Family unsure if she is slurring her words but she isnt saying much at all.  Meal completion this AM was 20%.Pt was asleep during time of visit and would not wake fully to arousal. When pt awoke for a bit, she was unable to respond to questions and seemed confused. No family at bedside. RD unable to obtain nutrition or weight history. Pt currently has Ensure ordered. RD to modify Ensure to TID to aid in caloric and protein needs as intake has been poor.   Nutrition-Focused physical exam completed. Findings are severe fat depletion, moderate to severe muscle depletion, and no edema.   Labs and medications reviewed.   Diet Order:  Diet Heart Room service appropriate?: Yes; Fluid consistency:: Thin  Skin:  Reviewed, no issues  Last BM:  Unknown  Height:   Ht Readings from Last 1 Encounters:  12/19/14 5\' 8"  (1.727 m)    Weight:   Wt Readings from Last 1 Encounters:  11/16/15 109 lb 11.2 oz (49.76 kg)    Ideal Body Weight:  63.6 kg  BMI:  Body mass index is 16.68  kg/(m^2).  Estimated Nutritional Needs:   Kcal:  1550-1750  Protein:  75-85 grams  Fluid:  >/= 1.5 L/day  EDUCATION NEEDS:   No education needs identified at this time  Roslyn SmilingStephanie Ankita Newcomer, MS, RD, LDN Pager # [phone] After hours/ weekend pager # [phone]

## 2015-11-16 NOTE — Progress Notes (Addendum)
[facility] Progress Note   Megan [name]  ZOX:[id number]  DOB: 09/15/1927  DOA: 11/15/2015 PCP: [name], MD  Brief narrative: Megan [name] is a 82 y.o. female with dementia presenting to the hospital for weakness and poor PO intake. Found to have a UTI.    Subjective: Sleepy, mildly confused. No complaints.   Assessment/Plan: Principal Problem:   UTI (lower urinary tract infection) - cont Rocephin- f/u cultures  Active Problems: Hypokalemia - replace and follow  Dehydration - cont IVF    Essential hypertension - cont Lisinopril and Amlodipine    Dementia with behavioral disturbance - cont Aricept, Clorazepate and PRN Haldol    Protein-calorie malnutrition, severe - started on Ensure  Mild thrombocytopenia - noted on multiple lab draws in the past as well   Antibiotics: Anti-infectives    Start     Dose/Rate Route Frequency Ordered Stop   11/16/15 2330  cefTRIAXone (ROCEPHIN) 1 g in dextrose 5 % 50 mL IVPB     1 g 100 mL/hr over 30 Minutes Intravenous Every 24 hours 11/16/15 0023     11/15/15 2330  cefTRIAXone (ROCEPHIN) 1 g in dextrose 5 % 50 mL IVPB     1 g 100 mL/hr over 30 Minutes Intravenous  Once 11/15/15 2321 11/16/15 0106     Code Status:     Code Status Orders        Start     Ordered   11/16/15 0026  Full code   Continuous     11/16/15 0027    Code Status History    Date Active Date Inactive Code Status Order ID Comments User Context   11/21/2014  6:06 PM 11/22/2014  9:26 PM Full Code [id number]  Catarina Hartshorn, MD Inpatient   08/25/2013  6:11 PM 08/29/2013  5:18 PM Full Code [id number]  Jeralyn Bennett, MD Inpatient   07/17/2013  9:50 AM 07/18/2013  4:58 PM Full Code [id number]  Rodolph Bong, MD Inpatient   02/27/2013  5:09 PM 03/02/2013  9:31 PM Full Code [id number]  Sorin Luanne Bras, MD Inpatient   04/27/2012  5:45 PM 05/02/2012  6:39 PM Full Code [id number]  Cristal Ford, MD ED   03/25/2012  4:06 AM 04/01/2012  8:39 PM Full Code  [id number]  Hyman Hopes, RN Inpatient     Family Communication:   Disposition Plan: home vs SNF in 1-2 days  DVT prophylaxis: Lovenox Consultants:  Procedures:     Objective: Filed Weights   11/16/15 0101  Weight: 49.76 kg (109 lb 11.2 oz)    Intake/Output Summary (Last 24 hours) at 11/16/15 1421 Last data filed at 11/16/15 0830  Gross per 24 hour  Intake 1095.42 ml  Output     31 ml  Net 1064.42 ml     Vitals Filed Vitals:   11/16/15 0015 11/16/15 0101 11/16/15 0454 11/16/15 1000  BP: 157/70 182/55 178/60 174/45  Pulse: 53 60 54 57  Temp:  98.1 F (36.7 C) 98.5 F (36.9 C) 98.2 F (36.8 C)  TempSrc:  Oral Oral Oral  Resp: 21 20 19 18   Weight:  49.76 kg (109 lb 11.2 oz)    SpO2: 99% 96% 98% 98%    Exam:  General:  Pt is alert, not in acute distress  HEENT: No icterus, No thrush, oral mucosa moist  Cardiovascular: regular rate and rhythm, S1/S2 No murmur  Respiratory: clear to auscultation bilaterally   Abdomen: Soft, +Bowel sounds, non tender, non distended, no guarding  MSK: No cyanosis or clubbing- no pedal edema   Data Reviewed: Basic Metabolic Panel:  Recent Labs Lab 11/15/15 2137  NA 145  K 2.9*  CL 101  CO2 33*  GLUCOSE 146*  BUN 18  CREATININE 0.76  CALCIUM 9.7   Liver Function Tests:  Recent Labs Lab 11/15/15 2137  AST 21  ALT 10*  ALKPHOS 65  BILITOT 1.9*  PROT 6.7  ALBUMIN 2.8*   No results for input(s): LIPASE, AMYLASE in the last 168 hours. No results for input(s): AMMONIA in the last 168 hours. CBC:  Recent Labs Lab 11/15/15 2137  WBC 9.4  NEUTROABS 6.8  HGB 13.9  HCT 42.3  MCV 92.0  PLT 127*   Cardiac Enzymes: No results for input(s): CKTOTAL, CKMB, CKMBINDEX, TROPONINI in the last 168 hours. BNP (last 3 results) No results for input(s): BNP in the last 8760 hours.  ProBNP (last 3 results) No results for input(s): PROBNP in the last 8760 hours.  CBG:  Recent Labs Lab 11/16/15 0056 11/16/15 0756  11/16/15 1213  GLUCAP 119* 119* 121*    No results found for this or any previous visit (from the past 240 hour(s)).   Studies: Dg Chest 2 View  11/15/2015  CLINICAL DATA:  Acute onset of generalized weakness, fatigue and lethargy. Poor appetite. Initial encounter. EXAM: CHEST  2 VIEW COMPARISON:  Chest radiograph performed 11/21/2014 FINDINGS: The lungs are well-aerated and clear. There is no evidence of focal opacification, pleural effusion or pneumothorax. The heart is borderline enlarged. No acute osseous abnormalities are seen. IMPRESSION: Borderline cardiomegaly.  Lungs remain grossly clear. Electronically Signed   By: Roanna RaiderJeffery  Chang M.D.   On: 11/15/2015 21:23   Ct Head Wo Contrast  11/15/2015  CLINICAL DATA:  Weakness and fatigue.  Hypertension and dementia. EXAM: CT HEAD WITHOUT CONTRAST TECHNIQUE: Contiguous axial images were obtained from the base of the skull through the vertex without intravenous contrast. COMPARISON:  11/21/2014 FINDINGS: There is no evidence of intracranial hemorrhage, brain edema, or other signs of acute infarction. There is no evidence of intracranial mass lesion or mass effect. No abnormal extraaxial fluid collections are identified. Mild to moderate cerebral atrophy is unchanged. Extensive chronic small vessel disease shows no significant change. Ventricles stable in size. No skull abnormality identified. Incidental note is made of hyperostosis frontalis interna. IMPRESSION: No acute intracranial findings. Stable cerebral atrophy and chronic small vessel disease. Electronically Signed   By: Myles RosenthalJohn  Stahl M.D.   On: 11/15/2015 21:37    Scheduled Meds:  Scheduled Meds: . amLODipine  5 mg Oral Daily  . aspirin  325 mg Oral Daily  . cefTRIAXone (ROCEPHIN)  IV  1 g Intravenous Q24H  . cholecalciferol  5,000 Units Oral Daily  . clorazepate  3.75 mg Oral TID  . donepezil  10 mg Oral QHS  . enoxaparin (LOVENOX) injection  40 mg Subcutaneous Q24H  . feeding  supplement (ENSURE ENLIVE)  237 mL Oral TID BM  . [START ON 11/17/2015] Influenza vac split quadrivalent PF  0.5 mL Intramuscular Tomorrow-1000  . latanoprost  1 drop Both Eyes QHS  . levothyroxine  50 mcg Oral QAC breakfast  . lisinopril  40 mg Oral Daily  . pantoprazole  40 mg Oral Daily  . potassium chloride  40 mEq Oral Once   Continuous Infusions: . sodium chloride 125 mL/hr at 11/16/15 0558    Time spent on care of this patient: 35 min   Apolonio Cutting, MD 11/16/2015, 2:21 PM  [facility] Office  [phone] Pager - Text Page per [email or web address] If 7PM-7AM, please contact night-coverage [email or web address]

## 2015-11-16 NOTE — H&P (Addendum)
[facility] History and Physical  Megan [name] [place] ZOX:[id number] DOB: 1926/12/04 DOA: 11/15/2015  Referring physician: EDP PCP: Nadean CorwinMCKEOWN,WILLIAM DAVID, MD   Chief Complaint: Poor PO intake   HPI: Megan [name] Boomhower is a 82 y.o. female with h/o dementia, patient has been doing very poorly over the past couple of days.  She normally uses a walker to walk but hasnt been doing well with that over past couple of days.  No N/V, has significantly decreased PO intake, significantly decreased speech from baseline as well.  Family unsure if she is slurring her words but she isnt saying much at all.  Not code stroke due to being way out side the time window (days- over a week of symptoms).  Review of Systems: Systems reviewed.  As above, otherwise negative  Past Medical History  Diagnosis Date  . Hypertension   . Diastolic dysfunction, left ventricle by ECHO 2011 03/25/2012  . Hypothyroidism 03/25/2012  . High cholesterol   . Type II diabetes mellitus (HCC)   . H/O hiatal hernia   . Duodenal perforation (HCC) 03/23/12  . Arthritis     "in my back"  . GERD (gastroesophageal reflux disease)   . Dementia    Past Surgical History  Procedure Laterality Date  . Bladder suspension    . Repair perforated ulcer  03/23/12  . Diagnostic laparoscopy  03/25/12    REPAIR OF PERFORATED ULCER with omental patch  . Abcess drainage  03/25/12     of abdominal & pelvic abscesses  . Laparoscopy  03/24/2012    Procedure: LAPAROSCOPY DIAGNOSTIC;  Surgeon: Ardeth SportsmanSteven C. Gross, MD;  Location: [facility] OR;  Service: General;  Laterality: N/A;  drainage of abdominal and pelvic abcesses  . Laparotomy  06/03/2012    Procedure: EXPLORATORY LAPAROTOMY;  Surgeon: Mariella SaaBenjamin T [name], MD;  [facility];  Service: General;  Laterality: N/A;   Social History:  reports that she has never smoked. Her smokeless tobacco use includes Snuff. She reports that she does not drink alcohol or use illicit drugs.  Allergies  Allergen  Reactions  . Cardura [Doxazosin] Other (See Comments)  . Nsaids Other (See Comments)    unknown    Family History  Problem Relation Age of Onset  . Hypertension Mother   . Hypertension Father   . Colon cancer Neg Hx   . Pancreatic cancer Neg Hx      Prior to Admission medications   Medication Sig Start Date End Date Taking? Authorizing Provider  amLODipine (NORVASC) 5 MG tablet TAKE 1 TABLET (5 MG TOTAL) BY MOUTH DAILY. 02/25/15  Yes Lucky CowboyWilliam McKeown, MD  aspirin 325 MG EC tablet Take 325 mg by mouth daily.   Yes Historical Provider, MD  Cholecalciferol (VITAMIN D3) 5000 UNITS CAPS Take 5,000 Units by mouth daily.   Yes Historical Provider, MD  clorazepate (TRANXENE-T) 3.75 MG tablet Take 1 tablet (3.75 mg total) by mouth 3 (three) times daily. 12/19/14  Yes Quentin MullingAmanda Collier, PA-C  donepezil (ARICEPT) 10 MG tablet TAKE 1 TABLET (10 MG TOTAL) BY MOUTH DAILY. 05/08/15  Yes Lucky CowboyWilliam McKeown, MD  haloperidol (HALDOL) 1 MG tablet Take 1 to 3 tablets/day if needed to control Agitation - need office visit before refill 10/20/15  Yes Lucky CowboyWilliam McKeown, MD  KLOR-CON M20 20 MEQ tablet TAKE 1 TABLET BY MOUTH TWICE A DAY 09/09/15  Yes Lucky CowboyWilliam McKeown, MD  levothyroxine (SYNTHROID, LEVOTHROID) 50 MCG tablet Take 1 tablet (50 mcg total) by mouth daily before breakfast. 12/21/14  Yes Chrissie NoaWilliam  Oneta Rack, MD  lisinopril (PRINIVIL,ZESTRIL) 40 MG tablet TAKE 1 TABLET (40 MG TOTAL) BY MOUTH DAILY. 09/02/15  Yes Lucky Cowboy, MD  pantoprazole (PROTONIX) 40 MG tablet TAKE 1 TABLET DAILY TO PREVENT ULCER 12/19/14  Yes Quentin Mulling, PA-C  TRAVATAN Z 0.004 % SOLN ophthalmic solution Place 1 drop into both eyes at bedtime.  04/13/14  Yes Historical Provider, MD   Physical Exam: Filed Vitals:   11/15/15 2045 11/15/15 2245  BP: 173/74 160/68  Pulse: 58 59  Temp:    Resp: 17 22    BP 160/68 mmHg  Pulse 59  Temp(Src) 98.5 F (36.9 C) (Oral)  Resp 22  SpO2 98%  General Appearance:    Alert, non-verbal, elderly and  frail, no distress, appears stated age  Head:    Normocephalic, atraumatic, temporal muscle wasting  Eyes:    PERRL, EOMI, sclera non-icteric        Nose:   Nares without drainage or epistaxis. Mucosa, turbinates normal  Throat:   Moist mucous membranes. Oropharynx without erythema or exudate.  Neck:   Supple. No carotid bruits.  No thyromegaly.  No lymphadenopathy.   Back:     No CVA tenderness, no spinal tenderness  Lungs:     Clear to auscultation bilaterally, without wheezes, rhonchi or rales  Chest wall:    No tenderness to palpitation  Heart:    Regular rate and rhythm without murmurs, gallops, rubs  Abdomen:     Soft, non-tender, nondistended, normal bowel sounds, no organomegaly  Genitalia:    deferred  Rectal:    deferred  Extremities:   No clubbing, cyanosis or edema.  Pulses:   2+ and symmetric all extremities  Skin:   Skin color, texture, turgor normal, no rashes or lesions  Lymph nodes:   Cervical, supraclavicular, and axillary nodes normal  Neurologic:   MAE, difficulty with BLE weakness, normal sensation, no facial droop, 4/5 strength in BUE.    Labs on Admission:  Basic Metabolic Panel:  Recent Labs Lab 11/15/15 2137  NA 145  K 2.9*  CL 101  CO2 33*  GLUCOSE 146*  BUN 18  CREATININE 0.76  CALCIUM 9.7   Liver Function Tests:  Recent Labs Lab 11/15/15 2137  AST 21  ALT 10*  ALKPHOS 65  BILITOT 1.9*  PROT 6.7  ALBUMIN 2.8*   No results for input(s): LIPASE, AMYLASE in the last 168 hours. No results for input(s): AMMONIA in the last 168 hours. CBC:  Recent Labs Lab 11/15/15 2137  WBC 9.4  NEUTROABS 6.8  HGB 13.9  HCT 42.3  MCV 92.0  PLT 127*   Cardiac Enzymes: No results for input(s): CKTOTAL, CKMB, CKMBINDEX, TROPONINI in the last 168 hours.  BNP (last 3 results) No results for input(s): PROBNP in the last 8760 hours. CBG: No results for input(s): GLUCAP in the last 168 hours.  Radiological Exams on Admission: Dg Chest 2  View  11/15/2015  CLINICAL DATA:  Acute onset of generalized weakness, fatigue and lethargy. Poor appetite. Initial encounter. EXAM: CHEST  2 VIEW COMPARISON:  Chest radiograph performed 11/21/2014 FINDINGS: The lungs are well-aerated and clear. There is no evidence of focal opacification, pleural effusion or pneumothorax. The heart is borderline enlarged. No acute osseous abnormalities are seen. IMPRESSION: Borderline cardiomegaly.  Lungs remain grossly clear. Electronically Signed   By: Roanna Raider M.D.   On: 11/15/2015 21:23   Ct Head Wo Contrast  11/15/2015  CLINICAL DATA:  Weakness and fatigue.  Hypertension and  dementia. EXAM: CT HEAD WITHOUT CONTRAST TECHNIQUE: Contiguous axial images were obtained from the base of the skull through the vertex without intravenous contrast. COMPARISON:  11/21/2014 FINDINGS: There is no evidence of intracranial hemorrhage, brain edema, or other signs of acute infarction. There is no evidence of intracranial mass lesion or mass effect. No abnormal extraaxial fluid collections are identified. Mild to moderate cerebral atrophy is unchanged. Extensive chronic small vessel disease shows no significant change. Ventricles stable in size. No skull abnormality identified. Incidental note is made of hyperostosis frontalis interna. IMPRESSION: No acute intracranial findings. Stable cerebral atrophy and chronic small vessel disease. Electronically Signed   By: Myles Rosenthal M.D.   On: 11/15/2015 21:37    EKG: Independently reviewed.  Assessment/Plan Principal Problem:   UTI (lower urinary tract infection) Active Problems:   Essential hypertension   Dementia with behavioral disturbance   1. UTI - 1. Empiric rocephin 2. Cultures pending 2. Malnutrition - 1. Suspect that mental status changes may be acute due to UTI 2. Getting nutrition consult 3. AMS - 1. If these do not improve with improvement of UTI then patient may also have adult failure to thrive which would be  even more concerning, but will treat UTI first and see how she responds. 2. Stroke is much lower on the differential, below UTI and adult failure to thrive.  If no improvement with improvement of UTI, could consider MRI, but exam is very non-focal for stroke.    Code Status: Full Code  Family Communication: Daughter at bedside Disposition Plan: Admit to obs   Time spent: 70 min  Zyrus Hetland M. [facility] Pager [phone]  If 7AM-7PM, please contact the day team taking care of the patient [email or web address] Password [facility] 11/16/2015, 12:27 AM

## 2015-11-17 DIAGNOSIS — E43 Unspecified severe protein-calorie malnutrition: Secondary | ICD-10-CM | POA: Diagnosis not present

## 2015-11-17 DIAGNOSIS — I10 Essential (primary) hypertension: Secondary | ICD-10-CM | POA: Diagnosis not present

## 2015-11-17 DIAGNOSIS — F0391 Unspecified dementia with behavioral disturbance: Secondary | ICD-10-CM | POA: Diagnosis present

## 2015-11-17 DIAGNOSIS — E039 Hypothyroidism, unspecified: Secondary | ICD-10-CM | POA: Diagnosis not present

## 2015-11-17 DIAGNOSIS — N390 Urinary tract infection, site not specified: Secondary | ICD-10-CM | POA: Diagnosis present

## 2015-11-17 DIAGNOSIS — E876 Hypokalemia: Secondary | ICD-10-CM | POA: Diagnosis not present

## 2015-11-17 DIAGNOSIS — K219 Gastro-esophageal reflux disease without esophagitis: Secondary | ICD-10-CM | POA: Diagnosis not present

## 2015-11-17 DIAGNOSIS — D696 Thrombocytopenia, unspecified: Secondary | ICD-10-CM | POA: Diagnosis not present

## 2015-11-17 DIAGNOSIS — Z79899 Other long term (current) drug therapy: Secondary | ICD-10-CM | POA: Diagnosis not present

## 2015-11-17 DIAGNOSIS — Z681 Body mass index (BMI) 19 or less, adult: Secondary | ICD-10-CM | POA: Diagnosis not present

## 2015-11-17 DIAGNOSIS — E119 Type 2 diabetes mellitus without complications: Secondary | ICD-10-CM | POA: Diagnosis not present

## 2015-11-17 DIAGNOSIS — R627 Adult failure to thrive: Secondary | ICD-10-CM | POA: Diagnosis not present

## 2015-11-17 DIAGNOSIS — M1990 Unspecified osteoarthritis, unspecified site: Secondary | ICD-10-CM | POA: Diagnosis not present

## 2015-11-17 DIAGNOSIS — E7800 Pure hypercholesterolemia, unspecified: Secondary | ICD-10-CM | POA: Diagnosis not present

## 2015-11-17 DIAGNOSIS — E860 Dehydration: Secondary | ICD-10-CM | POA: Diagnosis not present

## 2015-11-17 DIAGNOSIS — R531 Weakness: Secondary | ICD-10-CM | POA: Diagnosis present

## 2015-11-17 DIAGNOSIS — Z7982 Long term (current) use of aspirin: Secondary | ICD-10-CM | POA: Diagnosis not present

## 2015-11-17 DIAGNOSIS — Z720 Tobacco use: Secondary | ICD-10-CM | POA: Diagnosis not present

## 2015-11-17 DIAGNOSIS — Z23 Encounter for immunization: Secondary | ICD-10-CM | POA: Diagnosis not present

## 2015-11-17 LAB — BASIC METABOLIC PANEL
Anion gap: 5 (ref 5–15)
Anion gap: 10 (ref 5–15)
BUN: 7 mg/dL (ref 6–20)
BUN: 7 mg/dL (ref 6–20)
CALCIUM: 8.5 mg/dL — AB (ref 8.9–10.3)
CALCIUM: 8.7 mg/dL — AB (ref 8.9–10.3)
CHLORIDE: 104 mmol/L (ref 101–111)
CO2: 33 mmol/L — ABNORMAL HIGH (ref 22–32)
CO2: 30 mmol/L (ref 22–32)
CREATININE: 0.49 mg/dL (ref 0.44–1.00)
CREATININE: 0.54 mg/dL (ref 0.44–1.00)
Chloride: 107 mmol/L (ref 101–111)
GFR calc Af Amer: >60 mL/min (ref >60)
GFR calc non Af Amer: >60 mL/min (ref >60)
GLUCOSE: 118 mg/dL — AB (ref 65–99)
Glucose, Bld: 201 mg/dL — ABNORMAL HIGH (ref 65–99)
Potassium: 2.1 mmol/L — CL (ref 3.5–5.1)
Potassium: 3 mmol/L — ABNORMAL LOW (ref 3.5–5.1)
SODIUM: 144 mmol/L (ref 135–145)
Sodium: 145 mmol/L (ref 135–145)

## 2015-11-17 LAB — MAGNESIUM: Magnesium: 1.7 mg/dL (ref 1.7–2.4)

## 2015-11-17 LAB — CBC
HEMATOCRIT: 37.7 % (ref 36.0–46.0)
Hemoglobin: 12.2 g/dL (ref 12.0–15.0)
MCH: 30 pg (ref 26.0–34.0)
MCHC: 32.4 g/dL (ref 30.0–36.0)
MCV: 92.6 fL (ref 78.0–100.0)
PLATELETS: 130 10*3/uL — AB (ref 150–400)
RBC: 4.07 MIL/uL (ref 3.87–5.11)
RDW: 14.2 % (ref 11.5–15.5)
WBC: 6.4 10*3/uL (ref 4.0–10.5)

## 2015-11-17 LAB — GLUCOSE, CAPILLARY: GLUCOSE-CAPILLARY: 174 mg/dL — AB (ref 65–99)

## 2015-11-17 MED ORDER — POTASSIUM CHLORIDE CRYS ER 20 MEQ PO TBCR
40.0000 meq | EXTENDED_RELEASE_TABLET | ORAL | Status: AC
  Administered 2015-11-17 (×2): 40 meq via ORAL
  Filled 2015-11-17 (×2): qty 2

## 2015-11-17 MED ORDER — MEGESTROL ACETATE 40 MG PO TABS
40.0000 mg | ORAL_TABLET | Freq: Two times a day (BID) | ORAL | Status: DC
  Administered 2015-11-17 – 2015-11-18 (×2): 40 mg via ORAL
  Filled 2015-11-17 (×4): qty 1

## 2015-11-17 NOTE — Progress Notes (Signed)
CRITICAL VALUE ALERT  Critical value received:  Potassium 2.1  Date of notification:  11/17/15  Time of notification:  6:15am  Critical value read back:Yes.    Nurse who received alert: Scorpio Fortin  MD notified (1st page):  Tama GanderKatherine Schorr  Time of first page: 6:16am  MD notified (2nd page): Tama GanderKatherine Schorr  Time of second page: 6:30am  Responding MD:   Time MD responded:

## 2015-11-17 NOTE — Progress Notes (Signed)
[facility] Progress Note   Megan [name]  ZOX:[id number]  DOB: 18-Jan-1927  DOA: 11/15/2015 PCP: [name], MD  Brief narrative: Megan [name] is a 82 y.o. female with dementia presenting to the hospital for weakness and poor PO intake. Found to have a UTI.    Subjective: Confused. Per RN, her PO intake continues to be quite poor.   Assessment/Plan: Principal Problem:   UTI (lower urinary tract infection) - cont Rocephin- f/u cultures  Active Problems: Hypokalemia - replacing - check Mg  Dehydration - cont IVF    Essential hypertension - cont Lisinopril and Amlodipine    Dementia with behavioral disturbance - cont Aricept, Clorazepate and PRN Haldol    Protein-calorie malnutrition, severe - started on Ensure  Mild thrombocytopenia - noted on multiple lab draws in the past as well   Antibiotics: Anti-infectives    Start     Dose/Rate Route Frequency Ordered Stop   11/16/15 2330  cefTRIAXone (ROCEPHIN) 1 g in dextrose 5 % 50 mL IVPB     1 g 100 mL/hr over 30 Minutes Intravenous Every 24 hours 11/16/15 0023     11/15/15 2330  cefTRIAXone (ROCEPHIN) 1 g in dextrose 5 % 50 mL IVPB     1 g 100 mL/hr over 30 Minutes Intravenous  Once 11/15/15 2321 11/16/15 0106     Code Status:     Code Status Orders        Start     Ordered   11/16/15 0026  Full code   Continuous     11/16/15 0027    Code Status History    Date Active Date Inactive Code Status Order ID Comments User Context   11/21/2014  6:06 PM 11/22/2014  9:26 PM Full Code [id number]  Catarina Hartshorn, MD Inpatient   08/25/2013  6:11 PM 08/29/2013  5:18 PM Full Code [id number]  Jeralyn Bennett, MD Inpatient   07/17/2013  9:50 AM 07/18/2013  4:58 PM Full Code [id number]  Rodolph Bong, MD Inpatient   02/27/2013  5:09 PM 03/02/2013  9:31 PM Full Code [id number]  Sorin Luanne Bras, MD Inpatient   04/27/2012  5:45 PM 05/02/2012  6:39 PM Full Code [id number]  Cristal Ford, MD ED   03/25/2012  4:06 AM 04/01/2012   8:39 PM Full Code [id number]  Hyman Hopes, RN Inpatient     Family Communication:   Disposition Plan: home vs SNF in 1-2 days  DVT prophylaxis: Lovenox Consultants:  Procedures:     Objective: Filed Weights   11/16/15 0101 11/16/15 2204  Weight: 49.76 kg (109 lb 11.2 oz) 52.254 kg (115 lb 3.2 oz)    Intake/Output Summary (Last 24 hours) at 11/17/15 1025 Last data filed at 11/16/15 2340  Gross per 24 hour  Intake 3035.33 ml  Output      0 ml  Net 3035.33 ml     Vitals Filed Vitals:   11/16/15 2204 11/17/15 0500 11/17/15 0919 11/17/15 0948  BP: 195/60 192/57 161/65 161/70  Pulse: 66 52 80 79  Temp: 98.7 F (37.1 C) 97.8 F (36.6 C) 98.1 F (36.7 C)   TempSrc: Oral Oral Oral   Resp: 20 20 18    Weight: 52.254 kg (115 lb 3.2 oz)     SpO2: 100% 97% 100%     Exam:  General:  Pt is alert, not in acute distress  HEENT: No icterus, No thrush, oral mucosa moist  Cardiovascular: regular rate and rhythm, S1/S2 No murmur  Respiratory:  clear to auscultation bilaterally   Abdomen: Soft, +Bowel sounds, non tender, non distended, no guarding  MSK: No cyanosis or clubbing- no pedal edema   Data Reviewed: Basic Metabolic Panel:  Recent Labs Lab 11/15/15 2137 11/17/15 0416  NA 145 145  K 2.9* 2.1*  CL 101 107  CO2 33* 33*  GLUCOSE 146* 118*  BUN 18 7  CREATININE 0.76 0.49  CALCIUM 9.7 8.5*   Liver Function Tests:  Recent Labs Lab 11/15/15 2137  AST 21  ALT 10*  ALKPHOS 65  BILITOT 1.9*  PROT 6.7  ALBUMIN 2.8*   No results for input(s): LIPASE, AMYLASE in the last 168 hours. No results for input(s): AMMONIA in the last 168 hours. CBC:  Recent Labs Lab 11/15/15 2137 11/17/15 0416  WBC 9.4 6.4  NEUTROABS 6.8  --   HGB 13.9 12.2  HCT 42.3 37.7  MCV 92.0 92.6  PLT 127* 130*   Cardiac Enzymes: No results for input(s): CKTOTAL, CKMB, CKMBINDEX, TROPONINI in the last 168 hours. BNP (last 3 results) No results for input(s): BNP in the last 8760  hours.  ProBNP (last 3 results) No results for input(s): PROBNP in the last 8760 hours.  CBG:  Recent Labs Lab 11/16/15 0056 11/16/15 0756 11/16/15 1213 11/16/15 1651 11/16/15 2239  GLUCAP 119* 119* 121* 167* 154*    No results found for this or any previous visit (from the past 240 hour(s)).   Studies: Dg Chest 2 View  11/15/2015  CLINICAL DATA:  Acute onset of generalized weakness, fatigue and lethargy. Poor appetite. Initial encounter. EXAM: CHEST  2 VIEW COMPARISON:  Chest radiograph performed 11/21/2014 FINDINGS: The lungs are well-aerated and clear. There is no evidence of focal opacification, pleural effusion or pneumothorax. The heart is borderline enlarged. No acute osseous abnormalities are seen. IMPRESSION: Borderline cardiomegaly.  Lungs remain grossly clear. Electronically Signed   By: Roanna Raider M.D.   On: 11/15/2015 21:23   Ct Head Wo Contrast  11/15/2015  CLINICAL DATA:  Weakness and fatigue.  Hypertension and dementia. EXAM: CT HEAD WITHOUT CONTRAST TECHNIQUE: Contiguous axial images were obtained from the base of the skull through the vertex without intravenous contrast. COMPARISON:  11/21/2014 FINDINGS: There is no evidence of intracranial hemorrhage, brain edema, or other signs of acute infarction. There is no evidence of intracranial mass lesion or mass effect. No abnormal extraaxial fluid collections are identified. Mild to moderate cerebral atrophy is unchanged. Extensive chronic small vessel disease shows no significant change. Ventricles stable in size. No skull abnormality identified. Incidental note is made of hyperostosis frontalis interna. IMPRESSION: No acute intracranial findings. Stable cerebral atrophy and chronic small vessel disease. Electronically Signed   By: Myles Rosenthal M.D.   On: 11/15/2015 21:37    Scheduled Meds:  Scheduled Meds: . amLODipine  5 mg Oral Daily  . aspirin  325 mg Oral Daily  . cefTRIAXone (ROCEPHIN)  IV  1 g Intravenous Q24H   . cholecalciferol  5,000 Units Oral Daily  . clorazepate  3.75 mg Oral TID  . donepezil  10 mg Oral QHS  . enoxaparin (LOVENOX) injection  40 mg Subcutaneous Q24H  . feeding supplement (ENSURE ENLIVE)  237 mL Oral TID BM  . Influenza vac split quadrivalent PF  0.5 mL Intramuscular Tomorrow-1000  . latanoprost  1 drop Both Eyes QHS  . levothyroxine  50 mcg Oral QAC breakfast  . lisinopril  40 mg Oral Daily  . pantoprazole  40 mg Oral Daily  .  potassium chloride  40 mEq Oral Q4H   Continuous Infusions: . sodium chloride 125 mL/hr at 11/16/15 2037    Time spent on care of this patient: 35 min   Ludean Duhart, MD 11/17/2015, 10:25 AM    [facility] Office  [phone] Pager - Text Page per [email or web address] If 7PM-7AM, please contact night-coverage [email or web address]

## 2015-11-18 DIAGNOSIS — N390 Urinary tract infection, site not specified: Secondary | ICD-10-CM | POA: Diagnosis not present

## 2015-11-18 LAB — URINE CULTURE

## 2015-11-18 LAB — URINE MICROSCOPIC-ADD ON

## 2015-11-18 LAB — URINALYSIS, ROUTINE W REFLEX MICROSCOPIC
BILIRUBIN URINE: NEGATIVE
Glucose, UA: 100 mg/dL — AB
KETONES UR: NEGATIVE mg/dL
NITRITE: NEGATIVE
Protein, ur: NEGATIVE mg/dL
SPECIFIC GRAVITY, URINE: 1.011 (ref 1.005–1.030)
pH: 7.5 (ref 5.0–8.0)

## 2015-11-18 LAB — GLUCOSE, CAPILLARY
GLUCOSE-CAPILLARY: 156 mg/dL — AB (ref 65–99)
Glucose-Capillary: 147 mg/dL — ABNORMAL HIGH (ref 65–99)

## 2015-11-18 MED ORDER — POTASSIUM CHLORIDE CRYS ER 20 MEQ PO TBCR
40.0000 meq | EXTENDED_RELEASE_TABLET | ORAL | Status: AC
  Administered 2015-11-18 (×2): 40 meq via ORAL
  Filled 2015-11-18 (×2): qty 2

## 2015-11-18 MED ORDER — MEGESTROL ACETATE 40 MG PO TABS: 40.0000 mg | ORAL_TABLET | Freq: Two times a day (BID) | ORAL | Status: DC

## 2015-11-18 MED ORDER — AMLODIPINE BESYLATE 10 MG PO TABS: 10.0000 mg | ORAL_TABLET | Freq: Every day | ORAL | Status: DC

## 2015-11-18 MED ORDER — ENSURE ENLIVE PO LIQD: 237.0000 mL | Freq: Three times a day (TID) | ORAL | Status: DC

## 2015-11-18 NOTE — Clinical Social Work Note (Signed)
Clinical Social Work Assessment  Patient Details  Name: Megan [name] MRN: [id number] Date of Birth: Aug 12, 1927  Date of referral:  11/18/15               Reason for consult:  Facility Placement                Permission sought to share information with:  Family Supports Permission granted to share information::  No (Patient oriented to self only)  Name::     Danielle DessJoseph Solomon  Agency::     Relationship::  Son  Contact Information:  [phone]  Housing/Transportation Living arrangements for the past 2 months:  Single Family Home Source of Information:  Adult Children (Patient's son Jomarie LongsJoseph) Patient Interpreter Needed:  None Criminal Activity/Legal Involvement Pertinent to Current Situation/Hospitalization:  No - Comment as needed Significant Relationships:  Adult Children, Other Family Members Lives with:  Other (Comment) (Family) Do you feel safe going back to the place where you live?  No (Patient's son feels that ST rehab will benefit patient and assist her in being safer once she goes home.) Need for family participation in patient care:  Yes (Comment)  Care giving concerns:  Son expressed no concerns regarding patient's care during this hospitalization   Social Worker assessment / plan:  CSW talked with patient's son by phone as patient is only oriented to person. Mr. Lynnea FerrierSolomon expressed immediate agreement with SNF for short-term rehab and gave [facility] and (2) [facility].  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  Patient/Family's Understanding of and Emotional Response to Diagnosis, Current Treatment,  and Prognosis:  Not discussed.  Emotional Assessment Appearance:  Appears stated age Attitude/Demeanor/Rapport:  Unable to Assess (Looked in on patient, however she was asleep) Affect (typically observed):  Unable to Assess Orientation:  Oriented to Self Alcohol / Substance use:  Tobacco Use (Patient reported that she has never smoked cigarettes but did use snuff and has quit. Patient reported that she does not drink or use illicit drugs.) Psych involvement (Current and /or in the community):  No (Comment)  Discharge Needs  Concerns to be addressed:  Discharge Planning Concerns Readmission within the last 30 days:  No Current discharge risk:  None Barriers to Discharge:  No Barriers Identified   Cristobal GoldmannCrawford, Ajiah Mcglinn Bradley, LCSW 11/18/2015, 5:09 PM

## 2015-11-18 NOTE — Progress Notes (Signed)
Report given to [facility] charge nurse Eulogio Ditch([place]).

## 2015-11-18 NOTE — Evaluation (Signed)
Physical Therapy Evaluation Patient Details Name: Megan [name] MRN: [id number] DOB: 03-15-1927 Today's Date: 11/18/2015   History of Present Illness  Megan CapriceHester R Seay is a 82 y.o. female with h/o dementia, patient has been doing very poorly over the past couple of days. She normally uses a walker to walk but hasnt been doing well with that over past couple of days. No N/V, has significantly decreased PO intake, significantly decreased speech from baseline as well.    Clinical Impression  Pt with the above medical history presents today unable to provide history or orientation to time and place. Spoke with son on the phone to gain information after initial evaluation. Pt requiring Total A for bed mobility and MaxA x 2 for transfers yet able to stand with RW and A x1. PTA son stated she was able to complete most ADLs independently but more recently has been relying on niece for transfers and mobility. Recommending SNF once medically cleared to achieve maximal level of recovery towards her baseline level of independence..     Follow Up Recommendations SNF    Equipment Recommendations  None recommended by PT    Recommendations for Other Services OT consult     Precautions / Restrictions Precautions Precautions: Fall Precaution Comments: Dementia Restrictions Weight Bearing Restrictions: No      Mobility  Bed Mobility Overal bed mobility: Needs Assistance Bed Mobility: Supine to Sit;Sit to Supine     Supine to sit: Total assist;HOB elevated Sit to supine: Total assist;+2 for physical assistance   General bed mobility comments: Requiring total assistance, unable to initiate movements. max directiona verbal and tactile cues, assist for trunk elevation and LE management  Transfers Overall transfer level: Needs assistance Equipment used: Rolling walker (2 wheeled) Transfers: Sit to/from Stand Sit to Stand: +2 physical assistance;Max assist         General transfer comment:  Max x2 to initate sit to stand but able to stand at RW w/ Mod Ax1 once partically upright for PT to perform hygeine ADLs   Ambulation/Gait                Stairs            Wheelchair Mobility    Modified Rankin (Stroke Patients Only)       Balance Overall balance assessment: Needs assistance Sitting-balance support: Bilateral upper extremity supported;Feet unsupported Sitting balance-Leahy Scale: Poor     Standing balance support: Bilateral upper extremity supported Standing balance-Leahy Scale: Poor Standing balance comment: Mod A x1 with slouched support on RW                             Pertinent Vitals/Pain Pain Assessment: No/denies pain    Home Living Family/patient expects to be discharged to:: Private residence Living Arrangements: Non-relatives/Friends (niece) Available Help at Discharge: Family Type of Home: House Home Access: Stairs to enter   Secretary/administratorntrance Stairs-Number of Steps: 4   Home Equipment: Environmental consultantWalker - 2 wheels;Wheelchair - Lawyermanual;Shower seat Additional Comments: pt poor historian, called son who provided PLOF and home set up    Prior Function Level of Independence: Needs assistance   Gait / Transfers Assistance Needed: Able to ambualte short distanes with RW and assistance  ADL's / Homemaking Assistance Needed: niece present due to dementia however pt completed bathing and dressing without physical assist. niece cooked, pt able to feed self        Hand Dominance  Extremity/Trunk Assessment   Upper Extremity Assessment: Generalized weakness           Lower Extremity Assessment: Generalized weakness      Cervical / Trunk Assessment: Kyphotic  Communication   Communication: Expressive difficulties;Receptive difficulties  Cognition Arousal/Alertness: Lethargic Behavior During Therapy: Impulsive;Flat affect Overall Cognitive Status: History of cognitive impairments - at baseline (h/o dementia, poor  comprehension and command follow)       Memory: Decreased recall of precautions;Decreased short-term memory              General Comments General comments (skin integrity, edema, etc.): Pt soaked in bed and once she was moved into standing continued to have a bowel mvmt and urinate on the floor. Requiring Total A for ADLs    Exercises        Assessment/Plan    PT Assessment Patient needs continued PT services  PT Diagnosis Difficulty walking;Abnormality of gait;Generalized weakness;Altered mental status   PT Problem List Decreased strength;Decreased activity tolerance;Decreased balance;Decreased mobility;Decreased coordination;Decreased cognition;Decreased safety awareness;Decreased knowledge of use of DME  PT Treatment Interventions Gait training;DME instruction;Functional mobility training;Therapeutic activities;Therapeutic exercise;Balance training;Cognitive remediation;Patient/family education   PT Goals (Current goals can be found in the Care Plan section) Acute Rehab PT Goals Patient Stated Goal: none PT Goal Formulation: Patient unable to participate in goal setting Time For Goal Achievement: 12/02/15 Potential to Achieve Goals: Fair    Frequency Min 3X/week   Barriers to discharge Inaccessible home environment;Decreased caregiver support Family aware of needs at discharge and are accepting rehab option as well as looking into memory care units    Co-evaluation               End of Session Equipment Utilized During Treatment: Gait belt Activity Tolerance: Treatment limited secondary to medical complications (Comment) (Dementia) Patient left: in bed;with bed alarm set;with call bell/phone within reach Nurse Communication: Mobility status         Time: 1610-9604 PT Time Calculation (min) (ACUTE ONLY): 28 min   Charges:   PT Evaluation $PT Eval Moderate Complexity: 1 Procedure PT Treatments $Therapeutic Activity: 8-22 mins   PT G Codes:         Ulyses Jarred 27-Nov-2015, 12:26 PM  Ulyses Jarred, Student Physical Therapist Acute Rehab [phone]

## 2015-11-18 NOTE — Progress Notes (Signed)
Megan CapriceHester R [name] to be D/C'd Nursing Home per MD order.  Discussed prescriptions and follow up appointments with the patient. Prescriptions given to patient, medication list explained in detail. Pt verbalized understanding.    Medication List    TAKE these medications        amLODipine 10 MG tablet  Commonly known as:  NORVASC  Take 1 tablet (10 mg total) by mouth daily.  Start taking on:  11/19/2015     aspirin 325 MG EC tablet  Take 325 mg by mouth daily.     clorazepate 3.75 MG tablet  Commonly known as:  TRANXENE-T  Take 1 tablet (3.75 mg total) by mouth 3 (three) times daily.     donepezil 10 MG tablet  Commonly known as:  ARICEPT  TAKE 1 TABLET (10 MG TOTAL) BY MOUTH DAILY.     feeding supplement (ENSURE ENLIVE) Liqd  Take 237 mLs by mouth 3 (three) times daily between meals.     haloperidol 1 MG tablet  Commonly known as:  HALDOL  Take 1 to 3 tablets/day if needed to control Agitation - need office visit before refill     KLOR-CON M20 20 MEQ tablet  Generic drug:  potassium chloride SA  TAKE 1 TABLET BY MOUTH TWICE A DAY     levothyroxine 50 MCG tablet  Commonly known as:  SYNTHROID, LEVOTHROID  Take 1 tablet (50 mcg total) by mouth daily before breakfast.     lisinopril 40 MG tablet  Commonly known as:  PRINIVIL,ZESTRIL  TAKE 1 TABLET (40 MG TOTAL) BY MOUTH DAILY.     megestrol 40 MG tablet  Commonly known as:  MEGACE  Take 1 tablet (40 mg total) by mouth 2 (two) times daily.     pantoprazole 40 MG tablet  Commonly known as:  PROTONIX  TAKE 1 TABLET DAILY TO PREVENT ULCER     TRAVATAN Z 0.004 % Soln ophthalmic solution  Generic drug:  Travoprost (BAK Free)  Place 1 drop into both eyes at bedtime.     Vitamin D3 5000 units Caps  Take 5,000 Units by mouth daily.        Filed Vitals:   11/18/15 1135 11/18/15 1323  BP: 179/71 151/57  Pulse:    Temp:    Resp:      Skin clean, dry and intact without evidence of skin break down, no evidence of skin  tears noted. IV catheter discontinued intact. Site without signs and symptoms of complications. Dressing and pressure applied.No complaints noted.   Megan CluckKadeesha Nitara Szczerba RN [facility] Phone [phone]

## 2015-11-18 NOTE — Discharge Summary (Signed)
Physician Discharge Summary  RAVAN SCHLEMMER ZOX:[id number] DOB: 1927/04/15 DOA: 11/15/2015  PCP: [name], MD  Admit date: 11/15/2015 Discharge date: 11/18/2015  Time spent: 50 minutes  Recommendations for Outpatient Follow-up:  1. Will be transitioned to SNF  2. Follow PO intake at nursing facility - if no improvement seen, consider further work up 3. Bmet in 3 days.  4. Follow BP closely  Discharge Condition: stable    Discharge Diagnoses:  Principal Problem:   UTI (lower urinary tract infection) Active Problems:   Essential hypertension   Dementia with behavioral disturbance   Protein-calorie malnutrition, severe   Hypokalemia   History of present illness:  Megan [name] is a 82 y.o. female with dementia presenting to the hospital for weakness and poor PO intake. Found to have a UTI.   Hospital Course:  Principal Problem:  UTI (lower urinary tract infection) -culture revealing E coli sensitive to Rocephin-treatment completed  Active Problems: Hypokalemia - replacing - check Mg  Dehydration - resolved with  IVF   Essential hypertension - cont Lisinopril- increased dose of Amlodipine   Dementia with behavioral disturbance - cont Aricept, Clorazepate and PRN Haldol   Protein-calorie malnutrition, severe - started on Ensure and added Megace to stimulate appetite   Mild thrombocytopenia - noted on multiple lab draws in the past as well and therefore not acute   Discharge Exam: Filed Weights   11/16/15 0101 11/16/15 2204 11/17/15 2054  Weight: 49.76 kg (109 lb 11.2 oz) 52.254 kg (115 lb 3.2 oz) 51 kg (112 lb 7 oz)   Filed Vitals:   11/18/15 0937 11/18/15 1135  BP: 190/52 179/71  Pulse: 70   Temp: 98 F (36.7 C)   Resp: 15     General: AAO x 3, no distress Cardiovascular: RRR, no murmurs  Respiratory: clear to auscultation bilaterally GI: soft, non-tender, non-distended, bowel sound positive  Discharge Instructions You were  cared for by a hospitalist during your hospital stay. If you have any questions about your discharge medications or the care you received while you were in the hospital after you are discharged, you can call the unit and asked to speak with the hospitalist on call if the hospitalist that took care of you is not available. Once you are discharged, your primary care physician will handle any further medical issues. Please note that NO REFILLS for any discharge medications will be authorized once you are discharged, as it is imperative that you return to your primary care physician (or establish a relationship with a primary care physician if you do not have one) for your aftercare needs so that they can reassess your need for medications and monitor your lab values.      Discharge Instructions    Diet - low sodium heart healthy    Complete by:  As directed      Increase activity slowly    Complete by:  As directed             Medication List    TAKE these medications        amLODipine 10 MG tablet  Commonly known as:  NORVASC  Take 1 tablet (10 mg total) by mouth daily.  Start taking on:  11/19/2015     aspirin 325 MG EC tablet  Take 325 mg by mouth daily.     clorazepate 3.75 MG tablet  Commonly known as:  TRANXENE-T  Take 1 tablet (3.75 mg total) by mouth 3 (three) times daily.  donepezil 10 MG tablet  Commonly known as:  ARICEPT  TAKE 1 TABLET (10 MG TOTAL) BY MOUTH DAILY.     feeding supplement (ENSURE ENLIVE) Liqd  Take 237 mLs by mouth 3 (three) times daily between meals.     haloperidol 1 MG tablet  Commonly known as:  HALDOL  Take 1 to 3 tablets/day if needed to control Agitation - need office visit before refill     KLOR-CON M20 20 MEQ tablet  Generic drug:  potassium chloride SA  TAKE 1 TABLET BY MOUTH TWICE A DAY     levothyroxine 50 MCG tablet  Commonly known as:  SYNTHROID, LEVOTHROID  Take 1 tablet (50 mcg total) by mouth daily before breakfast.      lisinopril 40 MG tablet  Commonly known as:  PRINIVIL,ZESTRIL  TAKE 1 TABLET (40 MG TOTAL) BY MOUTH DAILY.     megestrol 40 MG tablet  Commonly known as:  MEGACE  Take 1 tablet (40 mg total) by mouth 2 (two) times daily.     pantoprazole 40 MG tablet  Commonly known as:  PROTONIX  TAKE 1 TABLET DAILY TO PREVENT ULCER     TRAVATAN Z 0.004 % Soln ophthalmic solution  Generic drug:  Travoprost (BAK Free)  Place 1 drop into both eyes at bedtime.     Vitamin D3 5000 units Caps  Take 5,000 Units by mouth daily.       Allergies  Allergen Reactions  . Cardura [Doxazosin] Other (See Comments)  . Nsaids Other (See Comments)    unknown      The results of significant diagnostics from this hospitalization (including imaging, microbiology, ancillary and laboratory) are listed below for reference.    Significant Diagnostic Studies: Dg Chest 2 View  11/15/2015  CLINICAL DATA:  Acute onset of generalized weakness, fatigue and lethargy. Poor appetite. Initial encounter. EXAM: CHEST  2 VIEW COMPARISON:  Chest radiograph performed 11/21/2014 FINDINGS: The lungs are well-aerated and clear. There is no evidence of focal opacification, pleural effusion or pneumothorax. The heart is borderline enlarged. No acute osseous abnormalities are seen. IMPRESSION: Borderline cardiomegaly.  Lungs remain grossly clear. Electronically Signed   By: Roanna RaiderJeffery  Chang M.D.   On: 11/15/2015 21:23   Ct Head Wo Contrast  11/15/2015  CLINICAL DATA:  Weakness and fatigue.  Hypertension and dementia. EXAM: CT HEAD WITHOUT CONTRAST TECHNIQUE: Contiguous axial images were obtained from the base of the skull through the vertex without intravenous contrast. COMPARISON:  11/21/2014 FINDINGS: There is no evidence of intracranial hemorrhage, brain edema, or other signs of acute infarction. There is no evidence of intracranial mass lesion or mass effect. No abnormal extraaxial fluid collections are identified. Mild to moderate  cerebral atrophy is unchanged. Extensive chronic small vessel disease shows no significant change. Ventricles stable in size. No skull abnormality identified. Incidental note is made of hyperostosis frontalis interna. IMPRESSION: No acute intracranial findings. Stable cerebral atrophy and chronic small vessel disease. Electronically Signed   By: Myles RosenthalJohn  Stahl M.D.   On: 11/15/2015 21:37    Microbiology: Recent Results (from the past 240 hour(s))  Urine culture     Status: None   Collection Time: 11/15/15 10:28 PM  Result Value Ref Range Status   Specimen Description URINE, CATHETERIZED  Final   Special Requests NONE  Final   Culture >=100,000 COLONIES/mL ESCHERICHIA COLI  Final   Report Status 11/18/2015 FINAL  Final   Organism ID, Bacteria ESCHERICHIA COLI  Final  Susceptibility   Escherichia coli - MIC*    AMPICILLIN <=2 SENSITIVE Sensitive     CEFAZOLIN <=4 SENSITIVE Sensitive     CEFTRIAXONE <=1 SENSITIVE Sensitive     CIPROFLOXACIN <=0.25 SENSITIVE Sensitive     GENTAMICIN <=1 SENSITIVE Sensitive     IMIPENEM <=0.25 SENSITIVE Sensitive     NITROFURANTOIN <=16 SENSITIVE Sensitive     TRIMETH/SULFA <=20 SENSITIVE Sensitive     AMPICILLIN/SULBACTAM <=2 SENSITIVE Sensitive     PIP/TAZO <=4 SENSITIVE Sensitive     * >=100,000 COLONIES/mL ESCHERICHIA COLI     Labs: Basic Metabolic Panel:  Recent Labs Lab 11/15/15 2137 11/17/15 0416 11/17/15 1125 11/17/15 1819  NA 145 145  --  144  K 2.9* 2.1*  --  3.0*  CL 101 107  --  104  CO2 33* 33*  --  30  GLUCOSE 146* 118*  --  201*  BUN 18 7  --  7  CREATININE 0.76 0.49  --  0.54  CALCIUM 9.7 8.5*  --  8.7*  MG  --   --  1.7  --    Liver Function Tests:  Recent Labs Lab 11/15/15 2137  AST 21  ALT 10*  ALKPHOS 65  BILITOT 1.9*  PROT 6.7  ALBUMIN 2.8*   No results for input(s): LIPASE, AMYLASE in the last 168 hours. No results for input(s): AMMONIA in the last 168 hours. CBC:  Recent Labs Lab 11/15/15 2137  11/17/15 0416  WBC 9.4 6.4  NEUTROABS 6.8  --   HGB 13.9 12.2  HCT 42.3 37.7  MCV 92.0 92.6  PLT 127* 130*   Cardiac Enzymes: No results for input(s): CKTOTAL, CKMB, CKMBINDEX, TROPONINI in the last 168 hours. BNP: BNP (last 3 results) No results for input(s): BNP in the last 8760 hours.  ProBNP (last 3 results) No results for input(s): PROBNP in the last 8760 hours.  CBG:  Recent Labs Lab 11/16/15 1213 11/16/15 1651 11/16/15 2239 11/17/15 2156 11/18/15 0729  GLUCAP 121* 167* 154* 174* 156*       Signed:  Calvert Cantor, MD [facility] 11/18/2015, 11:40 AM

## 2015-11-18 NOTE — Clinical Social Work Placement (Addendum)
   CLINICAL SOCIAL WORK PLACEMENT  NOTE 11/18/15 - DISCHARGED TO [facility]  Date:  11/18/2015  Patient Details  Name: Megan [name] MRN: [id number] Date of Birth: 1927/02/04  Clinical Social Work is seeking post-discharge placement for this patient at the Skilled  Nursing Facility level of care (*CSW will initial, date and re-position this form in  chart as items are completed):  No   Patient/family provided with [facility] Clinical Social Work Department's list of facilities offering this level of care within the geographic area requested by the patient (or if unable, by the patient's family).  Yes   Patient/family informed of their freedom to choose among providers that offer the needed level of care, that participate in Medicare, Medicaid or managed care program needed by the patient, have an available bed and are willing to accept the patient.  No   Patient/family informed of [employer]'s ownership interest in [facility] and [facility], as well as of the fact that they are under no obligation to receive care at these facilities.  PASRR submitted to EDS on       PASRR number received on       Existing PASRR number confirmed on 11/18/15     FL2 transmitted to all facilities in geographic area requested by pt/family on 11/11/15     FL2 transmitted to all facilities within larger geographic area on       Patient informed that his/her managed care company has contracts with or will negotiate with certain facilities, including the following:            Patient/family informed of bed offers received.  [facility]     Physician recommends and patient chooses bed at      [facility].  Patient to be transferred to facility by ambulance ((PTAR))     Patient family notified on 11/18/15 of transfer.  Name of family member notified:  Danielle DessJoseph Solomon - son [phone])      PHYSICIAN       Additional Comment:    _______________________________________________ Cristobal Goldmannrawford, Bracken Moffa Bradley, LCSW 11/18/2015, 5:13 PM

## 2015-11-18 NOTE — [place] FL2 (Signed)
[place] MEDICAID FL2 LEVEL OF CARE SCREENING TOOL     IDENTIFICATION  Patient Name: Megan [name] Birthdate: [birth year] Sex: female Admission Date (Current Location): 11/15/2015  [facility] [place]   Facility and Address:  [facility], [address], [place], [place] [address]      Provider Number: [id number]  Attending Physician Name and Address:  Calvert CantorSaima Rizwan, MD  Relative Name and Phone Number:  Danielle DessJoseph Solomon - son.  Phone [phone]    Current Level of Care: Hospital Recommended Level of Care: Skilled Nursing Facility Prior Approval Number:    Date Approved/Denied:   PASRR Number: [id number] (Eff. 04/29/12)  Discharge Plan: SNF    Current Diagnoses: Patient Active Problem List   Diagnosis Date Noted  . Protein-calorie malnutrition, severe 11/16/2015  . Hypokalemia   . Dementia with behavioral disturbance 04/29/2015  . Agitation requiring sedation protocol 04/29/2015  . Hypercalcemia 11/21/2014  . Orthostatic hypotension   . At high risk for falls 06/25/2014  . Atherosclerosis of abdominal aorta (HCC) 06/25/2014  . Essential hypertension 12/12/2013  . Vitamin D deficiency 12/12/2013  . GERD (gastroesophageal reflux disease)   . UTI (lower urinary tract infection) 08/25/2013  . SDAT 02/27/2013  . SBO (small bowel obstruction) (HCC) 05/28/2012  . T2 NIDDM w/Nephropathy 03/25/2012  . Hypothyroidism 03/25/2012    Orientation RESPIRATION BLADDER Height & Weight    Self  Normal Incontinent   112 lbs.  BEHAVIORAL SYMPTOMS/MOOD NEUROLOGICAL BOWEL NUTRITION STATUS      Incontinent Diet (Low sodium - Heart Healthy)  AMBULATORY STATUS COMMUNICATION OF NEEDS Skin   Extensive Assist (Patient unable to ambulate with physical therapist during evaluation on 1/16.) Verbally Normal                       Personal Care Assistance Level of Assistance  Bathing, Feeding, Dressing Bathing Assistance: Maximum  assistance Feeding assistance: Independent Dressing Assistance: Maximum assistance     Functional Limitations Info  Sight, Hearing, Speech Sight Info: Adequate Hearing Info: Adequate Speech Info: Adequate    SPECIAL CARE FACTORS FREQUENCY  PT (By licensed PT)     PT Frequency: Evaluated 11/17/14. 3X per week recommended by physical therpist.              Contractures Contractures Info: Not present    Additional Factors Info  Code Status, Allergies Code Status Info: Full code Allergies Info: Cardura Nsaids           Current Medications (11/18/2015):  This is the current hospital active medication list Current Facility-Administered Medications  Medication Dose Route Frequency Provider Last Rate Last Dose  . [START ON 11/19/2015] amLODipine (NORVASC) tablet 10 mg  10 mg Oral Daily Calvert CantorSaima Rizwan, MD      . aspirin EC tablet 325 mg  325 mg Oral Daily Hillary BowJared M Gardner, DO   325 mg at 11/18/15 1028  . cefTRIAXone (ROCEPHIN) 1 g in dextrose 5 % 50 mL IVPB  1 g Intravenous Q24H Hillary BowJared M Gardner, DO   1 g at 11/17/15 2222  . cholecalciferol (VITAMIN D) tablet 5,000 Units  5,000 Units Oral Daily Hillary BowJared M Gardner, DO   5,000 Units at 11/17/15 1110  . clorazepate (TRANXENE) tablet 3.75 mg  3.75 mg Oral TID Hillary BowJared M Gardner, DO   3.75 mg at 11/18/15 1028  . donepezil (ARICEPT) tablet 10 mg  10 mg Oral QHS Hillary BowJared M Gardner, DO   10 mg at  11/17/15 2112  . enoxaparin (LOVENOX) injection 40 mg  40 mg Subcutaneous Q24H Hillary Bow, DO   40 mg at 11/17/15 1330  . feeding supplement (ENSURE ENLIVE) (ENSURE ENLIVE) liquid 237 mL  237 mL Oral TID BM Marinell Blight, RD   237 mL at 11/18/15 1028  . haloperidol (HALDOL) tablet 1 mg  1 mg Oral Q8H PRN Hillary Bow, DO      . latanoprost (XALATAN) 0.005 % ophthalmic solution 1 drop  1 drop Both Eyes QHS Hillary Bow, DO   1 drop at 11/17/15 2113  . levothyroxine (SYNTHROID, LEVOTHROID) tablet 50 mcg  50 mcg Oral QAC breakfast Hillary Bow,  DO   50 mcg at 11/18/15 0745  . lisinopril (PRINIVIL,ZESTRIL) tablet 40 mg  40 mg Oral Daily Hillary Bow, DO   40 mg at 11/18/15 1028  . megestrol (MEGACE) tablet 40 mg  40 mg Oral BID Calvert Cantor, MD   40 mg at 11/18/15 1029  . pantoprazole (PROTONIX) EC tablet 40 mg  40 mg Oral Daily Hillary Bow, DO   40 mg at 11/18/15 1029  . potassium chloride SA (K-DUR,KLOR-CON) CR tablet 40 mEq  40 mEq Oral Q4H Calvert Cantor, MD   40 mEq at 11/18/15 1027     Discharge Medications: Please see discharge summary for a list of discharge medications.  Relevant Imaging Results:  Relevant Lab Results:   Additional Information    Okey Dupre, Lazaro Arms, LCSW

## 2015-11-19 ENCOUNTER — Inpatient Hospital Stay (HOSPITAL_COMMUNITY)
Admission: EM | Admit: 2015-11-19 | Discharge: 2015-12-04 | DRG: 064 | Disposition: A | Discharge status: Hospice | Payer: Medicare Other | Attending: Internal Medicine | Admitting: Internal Medicine

## 2015-11-19 ENCOUNTER — Inpatient Hospital Stay (HOSPITAL_COMMUNITY): Payer: Medicare Other

## 2015-11-19 ENCOUNTER — Encounter (HOSPITAL_COMMUNITY): Payer: Self-pay | Admitting: *Deleted

## 2015-11-19 ENCOUNTER — Emergency Department (HOSPITAL_COMMUNITY): Payer: Medicare Other

## 2015-11-19 DIAGNOSIS — E876 Hypokalemia: Secondary | ICD-10-CM | POA: Diagnosis present

## 2015-11-19 DIAGNOSIS — Z888 Allergy status to other drugs, medicaments and biological substances status: Secondary | ICD-10-CM

## 2015-11-19 DIAGNOSIS — Z4659 Encounter for fitting and adjustment of other gastrointestinal appliance and device: Secondary | ICD-10-CM

## 2015-11-19 DIAGNOSIS — E1165 Type 2 diabetes mellitus with hyperglycemia: Secondary | ICD-10-CM | POA: Diagnosis present

## 2015-11-19 DIAGNOSIS — L89152 Pressure ulcer of sacral region, stage 2: Secondary | ICD-10-CM | POA: Diagnosis present

## 2015-11-19 DIAGNOSIS — Z7982 Long term (current) use of aspirin: Secondary | ICD-10-CM

## 2015-11-19 DIAGNOSIS — J9600 Acute respiratory failure, unspecified whether with hypoxia or hypercapnia: Secondary | ICD-10-CM | POA: Diagnosis not present

## 2015-11-19 DIAGNOSIS — R471 Dysarthria and anarthria: Secondary | ICD-10-CM | POA: Diagnosis present

## 2015-11-19 DIAGNOSIS — E43 Unspecified severe protein-calorie malnutrition: Secondary | ICD-10-CM | POA: Diagnosis present

## 2015-11-19 DIAGNOSIS — B9620 Unspecified Escherichia coli [E. coli] as the cause of diseases classified elsewhere: Secondary | ICD-10-CM | POA: Diagnosis present

## 2015-11-19 DIAGNOSIS — I161 Hypertensive emergency: Secondary | ICD-10-CM | POA: Diagnosis present

## 2015-11-19 DIAGNOSIS — F0390 Unspecified dementia without behavioral disturbance: Secondary | ICD-10-CM | POA: Diagnosis present

## 2015-11-19 DIAGNOSIS — I63522 Cerebral infarction due to unspecified occlusion or stenosis of left anterior cerebral artery: Secondary | ICD-10-CM | POA: Diagnosis present

## 2015-11-19 DIAGNOSIS — E1121 Type 2 diabetes mellitus with diabetic nephropathy: Secondary | ICD-10-CM | POA: Diagnosis present

## 2015-11-19 DIAGNOSIS — R000 Tachycardia, unspecified: Secondary | ICD-10-CM | POA: Diagnosis present

## 2015-11-19 DIAGNOSIS — R402112 Coma scale, eyes open, never, at arrival to emergency department: Secondary | ICD-10-CM | POA: Diagnosis present

## 2015-11-19 DIAGNOSIS — E785 Hyperlipidemia, unspecified: Secondary | ICD-10-CM | POA: Diagnosis present

## 2015-11-19 DIAGNOSIS — Z789 Other specified health status: Secondary | ICD-10-CM | POA: Insufficient documentation

## 2015-11-19 DIAGNOSIS — Z886 Allergy status to analgesic agent status: Secondary | ICD-10-CM

## 2015-11-19 DIAGNOSIS — D649 Anemia, unspecified: Secondary | ICD-10-CM | POA: Diagnosis present

## 2015-11-19 DIAGNOSIS — Z978 Presence of other specified devices: Secondary | ICD-10-CM

## 2015-11-19 DIAGNOSIS — I6330 Cerebral infarction due to thrombosis of unspecified cerebral artery: Secondary | ICD-10-CM | POA: Diagnosis not present

## 2015-11-19 DIAGNOSIS — K219 Gastro-esophageal reflux disease without esophagitis: Secondary | ICD-10-CM | POA: Diagnosis present

## 2015-11-19 DIAGNOSIS — R4020 Unspecified coma: Secondary | ICD-10-CM | POA: Diagnosis present

## 2015-11-19 DIAGNOSIS — Z515 Encounter for palliative care: Secondary | ICD-10-CM | POA: Diagnosis not present

## 2015-11-19 DIAGNOSIS — R54 Age-related physical debility: Secondary | ICD-10-CM | POA: Diagnosis present

## 2015-11-19 DIAGNOSIS — Z79899 Other long term (current) drug therapy: Secondary | ICD-10-CM

## 2015-11-19 DIAGNOSIS — E039 Hypothyroidism, unspecified: Secondary | ICD-10-CM | POA: Diagnosis present

## 2015-11-19 DIAGNOSIS — G9340 Encephalopathy, unspecified: Secondary | ICD-10-CM

## 2015-11-19 DIAGNOSIS — I10 Essential (primary) hypertension: Secondary | ICD-10-CM | POA: Diagnosis not present

## 2015-11-19 DIAGNOSIS — E7800 Pure hypercholesterolemia, unspecified: Secondary | ICD-10-CM | POA: Diagnosis present

## 2015-11-19 DIAGNOSIS — I6389 Other cerebral infarction: Secondary | ICD-10-CM | POA: Diagnosis present

## 2015-11-19 DIAGNOSIS — R402312 Coma scale, best motor response, none, at arrival to emergency department: Secondary | ICD-10-CM | POA: Diagnosis present

## 2015-11-19 DIAGNOSIS — J189 Pneumonia, unspecified organism: Secondary | ICD-10-CM | POA: Diagnosis present

## 2015-11-19 DIAGNOSIS — J9601 Acute respiratory failure with hypoxia: Secondary | ICD-10-CM | POA: Diagnosis present

## 2015-11-19 DIAGNOSIS — N390 Urinary tract infection, site not specified: Secondary | ICD-10-CM | POA: Diagnosis present

## 2015-11-19 DIAGNOSIS — E872 Acidosis: Secondary | ICD-10-CM | POA: Diagnosis present

## 2015-11-19 DIAGNOSIS — R402212 Coma scale, best verbal response, none, at arrival to emergency department: Secondary | ICD-10-CM | POA: Diagnosis present

## 2015-11-19 DIAGNOSIS — F17290 Nicotine dependence, other tobacco product, uncomplicated: Secondary | ICD-10-CM | POA: Diagnosis present

## 2015-11-19 DIAGNOSIS — E873 Alkalosis: Secondary | ICD-10-CM | POA: Diagnosis present

## 2015-11-19 DIAGNOSIS — Z66 Do not resuscitate: Secondary | ICD-10-CM | POA: Diagnosis present

## 2015-11-19 DIAGNOSIS — Z8249 Family history of ischemic heart disease and other diseases of the circulatory system: Secondary | ICD-10-CM | POA: Diagnosis not present

## 2015-11-19 DIAGNOSIS — R4189 Other symptoms and signs involving cognitive functions and awareness: Secondary | ICD-10-CM

## 2015-11-19 DIAGNOSIS — I159 Secondary hypertension, unspecified: Secondary | ICD-10-CM | POA: Diagnosis present

## 2015-11-19 DIAGNOSIS — Y95 Nosocomial condition: Secondary | ICD-10-CM | POA: Diagnosis present

## 2015-11-19 DIAGNOSIS — L8990 Pressure ulcer of unspecified site, unspecified stage: Secondary | ICD-10-CM | POA: Diagnosis present

## 2015-11-19 DIAGNOSIS — I959 Hypotension, unspecified: Secondary | ICD-10-CM | POA: Diagnosis not present

## 2015-11-19 DIAGNOSIS — G40901 Epilepsy, unspecified, not intractable, with status epilepticus: Secondary | ICD-10-CM | POA: Diagnosis present

## 2015-11-19 DIAGNOSIS — Z9889 Other specified postprocedural states: Secondary | ICD-10-CM

## 2015-11-19 DIAGNOSIS — I638 Other cerebral infarction: Secondary | ICD-10-CM | POA: Diagnosis not present

## 2015-11-19 DIAGNOSIS — E038 Other specified hypothyroidism: Secondary | ICD-10-CM | POA: Diagnosis present

## 2015-11-19 LAB — MAGNESIUM: Magnesium: 1.4 mg/dL — ABNORMAL LOW (ref 1.7–2.4)

## 2015-11-19 LAB — CBC WITH DIFFERENTIAL/PLATELET
Basophils Absolute: 0 10*3/uL (ref 0.0–0.1)
Basophils Relative: 0 %
EOS ABS: 0 10*3/uL (ref 0.0–0.7)
EOS PCT: 0 %
HCT: 47.5 % — ABNORMAL HIGH (ref 36.0–46.0)
Hemoglobin: 15.6 g/dL — ABNORMAL HIGH (ref 12.0–15.0)
LYMPHS ABS: 1.6 10*3/uL (ref 0.7–4.0)
Lymphocytes Relative: 17 %
MCH: 30.1 pg (ref 26.0–34.0)
MCHC: 32.8 g/dL (ref 30.0–36.0)
MCV: 91.7 fL (ref 78.0–100.0)
Monocytes Absolute: 0.4 10*3/uL (ref 0.1–1.0)
Monocytes Relative: 5 %
Neutro Abs: 7.1 10*3/uL (ref 1.7–7.7)
Neutrophils Relative %: 78 %
PLATELETS: 153 10*3/uL (ref 150–400)
RBC: 5.18 MIL/uL — AB (ref 3.87–5.11)
RDW: 14 % (ref 11.5–15.5)
WBC: 9.2 10*3/uL (ref 4.0–10.5)

## 2015-11-19 LAB — CK: CK TOTAL: 34 U/L — AB (ref 38–234)

## 2015-11-19 LAB — COMPREHENSIVE METABOLIC PANEL
ALT: 8 U/L — AB (ref 14–54)
ANION GAP: 11 (ref 5–15)
AST: 17 U/L (ref 15–41)
Albumin: 2.7 g/dL — ABNORMAL LOW (ref 3.5–5.0)
Alkaline Phosphatase: 67 U/L (ref 38–126)
BUN: <5 mg/dL — ABNORMAL LOW (ref 6–20)
CHLORIDE: 102 mmol/L (ref 101–111)
CO2: 29 mmol/L (ref 22–32)
Calcium: 9.5 mg/dL (ref 8.9–10.3)
Creatinine, Ser: 0.67 mg/dL (ref 0.44–1.00)
GFR calc non Af Amer: >60 mL/min (ref >60)
Glucose, Bld: 172 mg/dL — ABNORMAL HIGH (ref 65–99)
POTASSIUM: 3.3 mmol/L — AB (ref 3.5–5.1)
SODIUM: 142 mmol/L (ref 135–145)
Total Bilirubin: 0.8 mg/dL (ref 0.3–1.2)
Total Protein: 7.4 g/dL (ref 6.5–8.1)

## 2015-11-19 LAB — I-STAT ARTERIAL BLOOD GAS, ED
ACID-BASE EXCESS: 9 mmol/L — AB (ref 0.0–2.0)
ACID-BASE EXCESS: 5 mmol/L — AB (ref 0.0–2.0)
Bicarbonate: 32.4 mEq/L — ABNORMAL HIGH (ref 20.0–24.0)
Bicarbonate: 27.6 mEq/L — ABNORMAL HIGH (ref 20.0–24.0)
O2 SAT: 100 %
O2 SAT: 99 %
PCO2 ART: 37.4 mmHg (ref 35.0–45.0)
PH ART: 7.509 — AB (ref 7.350–7.450)
PH ART: 7.485 — AB (ref 7.350–7.450)
Patient temperature: 102.4
TCO2: 34 mmol/L (ref 0–100)
TCO2: 29 mmol/L (ref 0–100)
pCO2 arterial: 41.4 mmHg (ref 35.0–45.0)
pO2, Arterial: 504 mmHg — ABNORMAL HIGH (ref 80.0–100.0)
pO2, Arterial: 118 mmHg — ABNORMAL HIGH (ref 80.0–100.0)

## 2015-11-19 LAB — URINE MICROSCOPIC-ADD ON: Bacteria, UA: NONE SEEN

## 2015-11-19 LAB — URINALYSIS, ROUTINE W REFLEX MICROSCOPIC
BILIRUBIN URINE: NEGATIVE
Glucose, UA: 250 mg/dL — AB
KETONES UR: NEGATIVE mg/dL
NITRITE: NEGATIVE
Protein, ur: 100 mg/dL — AB
Specific Gravity, Urine: 1.01 (ref 1.005–1.030)
pH: 8 (ref 5.0–8.0)

## 2015-11-19 LAB — GLUCOSE, CAPILLARY: Glucose-Capillary: 123 mg/dL — ABNORMAL HIGH (ref 65–99)

## 2015-11-19 LAB — LIPASE, BLOOD: LIPASE: 15 U/L (ref 11–51)

## 2015-11-19 LAB — I-STAT CG4 LACTIC ACID, ED
LACTIC ACID, VENOUS: 3.53 mmol/L — AB (ref 0.5–2.0)
LACTIC ACID, VENOUS: 2.78 mmol/L — AB (ref 0.5–2.0)

## 2015-11-19 LAB — I-STAT TROPONIN, ED: Troponin i, poc: 0.04 ng/mL (ref 0.00–0.08)

## 2015-11-19 LAB — PHOSPHORUS: Phosphorus: 2.7 mg/dL (ref 2.5–4.6)

## 2015-11-19 LAB — TRIGLYCERIDES: TRIGLYCERIDES: 92 mg/dL (ref <150)

## 2015-11-19 LAB — AMMONIA: AMMONIA: 25 umol/L (ref 9–35)

## 2015-11-19 LAB — MRSA PCR SCREENING: MRSA BY PCR: NEGATIVE

## 2015-11-19 MED ORDER — DEXTROSE 5 % IV SOLN
0.5000 mg/min | INTRAVENOUS | Status: DC
  Administered 2015-11-19: 1 mg/min via INTRAVENOUS
  Filled 2015-11-19: qty 100

## 2015-11-19 MED ORDER — MAGNESIUM SULFATE 4 GM/100ML IV SOLN
4.0000 g | Freq: Once | INTRAVENOUS | Status: AC
  Administered 2015-11-19: 4 g via INTRAVENOUS
  Filled 2015-11-19: qty 100

## 2015-11-19 MED ORDER — PROPOFOL BOLUS VIA INFUSION
0.5000 mg/kg | Freq: Once | INTRAVENOUS | Status: DC
  Filled 2015-11-19: qty 26

## 2015-11-19 MED ORDER — PIPERACILLIN-TAZOBACTAM 3.375 G IVPB
3.3750 g | Freq: Three times a day (TID) | INTRAVENOUS | Status: DC
Start: 2015-11-19 — End: 2015-11-22
  Administered 2015-11-19 – 2015-11-22 (×9): 3.375 g via INTRAVENOUS
  Filled 2015-11-19 (×10): qty 50

## 2015-11-19 MED ORDER — VANCOMYCIN HCL IN DEXTROSE 750-5 MG/150ML-% IV SOLN
750.0000 mg | INTRAVENOUS | Status: DC
  Administered 2015-11-20: 750 mg via INTRAVENOUS
  Filled 2015-11-19 (×2): qty 150

## 2015-11-19 MED ORDER — DEXTROSE 5 % IV SOLN
0.5000 mg/min | INTRAVENOUS | Status: DC
  Administered 2015-11-19: 0.5 mg/min via INTRAVENOUS
  Filled 2015-11-19: qty 100

## 2015-11-19 MED ORDER — IBUPROFEN 100 MG/5ML PO SUSP: 600.0000 mg | Freq: Once | ORAL | Status: DC

## 2015-11-19 MED ORDER — ASPIRIN 300 MG RE SUPP
300.0000 mg | RECTAL | Status: AC
  Administered 2015-11-19: 300 mg via RECTAL
  Filled 2015-11-19: qty 1

## 2015-11-19 MED ORDER — PIPERACILLIN-TAZOBACTAM 3.375 G IVPB 30 MIN
3.3750 g | Freq: Once | INTRAVENOUS | Status: AC
  Administered 2015-11-19: 3.375 g via INTRAVENOUS
  Filled 2015-11-19: qty 50

## 2015-11-19 MED ORDER — ACETAMINOPHEN 650 MG RE SUPP
650.0000 mg | Freq: Once | RECTAL | Status: AC
  Administered 2015-11-19: 650 mg via RECTAL
  Filled 2015-11-19: qty 1

## 2015-11-19 MED ORDER — HEPARIN SODIUM (PORCINE) 5000 UNIT/ML IJ SOLN
5000.0000 [IU] | Freq: Three times a day (TID) | INTRAMUSCULAR | Status: DC
  Administered 2015-11-19 – 2015-11-20 (×3): 5000 [IU] via SUBCUTANEOUS
  Filled 2015-11-19 (×6): qty 1

## 2015-11-19 MED ORDER — SODIUM CHLORIDE 0.9 % IV SOLN
1500.0000 mg | Freq: Once | INTRAVENOUS | Status: AC
  Administered 2015-11-19: 1500 mg via INTRAVENOUS
  Filled 2015-11-19 (×2): qty 15

## 2015-11-19 MED ORDER — ANTISEPTIC ORAL RINSE SOLUTION (CORINZ)
7.0000 mL | Freq: Four times a day (QID) | OROMUCOSAL | Status: DC
  Administered 2015-11-20 – 2015-12-04 (×54): 7 mL via OROMUCOSAL

## 2015-11-19 MED ORDER — ROCURONIUM BROMIDE 50 MG/5ML IV SOLN
INTRAVENOUS | Status: AC | PRN
  Administered 2015-11-19: 50 mg via INTRAVENOUS

## 2015-11-19 MED ORDER — NALOXONE HCL 0.4 MG/ML IJ SOLN
INTRAMUSCULAR | Status: AC
  Filled 2015-11-19: qty 1

## 2015-11-19 MED ORDER — PROPOFOL 1000 MG/100ML IV EMUL
5.0000 ug/kg/min | INTRAVENOUS | Status: DC
  Administered 2015-11-19: 20 ug/kg/min via INTRAVENOUS

## 2015-11-19 MED ORDER — SODIUM CHLORIDE 0.9 % IV SOLN
250.0000 mL | INTRAVENOUS | Status: DC | PRN
  Administered 2015-11-19 – 2015-11-25 (×2): 250 mL via INTRAVENOUS

## 2015-11-19 MED ORDER — ASPIRIN 81 MG PO CHEW: 324.0000 mg | CHEWABLE_TABLET | ORAL | Status: AC

## 2015-11-19 MED ORDER — SODIUM CHLORIDE 0.9 % IV BOLUS (SEPSIS)
1000.0000 mL | Freq: Once | INTRAVENOUS | Status: AC
  Administered 2015-11-19: 1000 mL via INTRAVENOUS

## 2015-11-19 MED ORDER — FENTANYL CITRATE (PF) 100 MCG/2ML IJ SOLN: 50.0000 ug | INTRAMUSCULAR | Status: DC | PRN

## 2015-11-19 MED ORDER — LATANOPROST 0.005 % OP SOLN
1.0000 [drp] | Freq: Every day | OPHTHALMIC | Status: DC
Start: 2015-11-19 — End: 2015-12-04
  Administered 2015-11-19 – 2015-12-03 (×15): 1 [drp] via OPHTHALMIC
  Filled 2015-11-19: qty 2.5

## 2015-11-19 MED ORDER — FENTANYL CITRATE (PF) 100 MCG/2ML IJ SOLN
INTRAMUSCULAR | Status: AC
  Filled 2015-11-19: qty 2

## 2015-11-19 MED ORDER — PANTOPRAZOLE SODIUM 40 MG IV SOLR
40.0000 mg | Freq: Every day | INTRAVENOUS | Status: DC
  Administered 2015-11-19: 40 mg via INTRAVENOUS
  Filled 2015-11-19 (×2): qty 40

## 2015-11-19 MED ORDER — NALOXONE HCL 0.4 MG/ML IJ SOLN
0.4000 mg | Freq: Once | INTRAMUSCULAR | Status: AC
  Administered 2015-11-19: 0.4 mg via INTRAVENOUS

## 2015-11-19 MED ORDER — FENTANYL CITRATE (PF) 100 MCG/2ML IJ SOLN
INTRAMUSCULAR | Status: AC | PRN
  Administered 2015-11-19: 50 ug via INTRAVENOUS

## 2015-11-19 MED ORDER — VANCOMYCIN HCL IN DEXTROSE 1-5 GM/200ML-% IV SOLN
1000.0000 mg | Freq: Once | INTRAVENOUS | Status: AC
  Administered 2015-11-19: 1000 mg via INTRAVENOUS
  Filled 2015-11-19: qty 200

## 2015-11-19 MED ORDER — VITAL HIGH PROTEIN PO LIQD
1000.0000 mL | ORAL | Status: DC
  Administered 2015-11-19 – 2015-11-20 (×3): 1000 mL

## 2015-11-19 MED ORDER — CHLORHEXIDINE GLUCONATE 0.12% ORAL RINSE (MEDLINE KIT)
15.0000 mL | Freq: Two times a day (BID) | OROMUCOSAL | Status: DC
  Administered 2015-11-19 – 2015-12-04 (×27): 15 mL via OROMUCOSAL

## 2015-11-19 MED ORDER — SODIUM CHLORIDE 0.9 % IV SOLN
INTRAVENOUS | Status: DC
  Administered 2015-11-19 – 2015-11-20 (×2): via INTRAVENOUS

## 2015-11-19 MED ORDER — POTASSIUM CHLORIDE 20 MEQ/15ML (10%) PO SOLN
40.0000 meq | Freq: Once | ORAL | Status: AC
  Administered 2015-11-19: 40 meq
  Filled 2015-11-19: qty 30

## 2015-11-19 MED ORDER — LEVOTHYROXINE SODIUM 50 MCG PO TABS
50.0000 ug | ORAL_TABLET | Freq: Every day | ORAL | Status: DC
Start: 2015-11-20 — End: 2015-11-27
  Administered 2015-11-20 – 2015-11-27 (×8): 50 ug
  Filled 2015-11-19 (×11): qty 1

## 2015-11-19 MED ORDER — ETOMIDATE 2 MG/ML IV SOLN
INTRAVENOUS | Status: AC | PRN
  Administered 2015-11-19: 15 mg via INTRAVENOUS

## 2015-11-19 NOTE — Procedures (Signed)
Central Venous Catheter Insertion Procedure Note Megan [name] [id number] 1927-09-22  Procedure: Insertion of Central Venous Catheter Indications: Drug and/or fluid administration and Frequent blood sampling  Procedure Details Consent: Unable to obtain consent because of emergent medical necessity. Time Out: Verified patient identification, verified procedure, site/side was marked, verified correct patient position, special equipment/implants available, medications/allergies/relevent history reviewed, required imaging and test results available.  Performed Real time [place] was used to ID and cannulate vessel.  Maximum sterile technique was used including antiseptics, cap, gloves, gown, hand hygiene, mask and sheet. Skin prep: Chlorhexidine; local anesthetic administered A antimicrobial bonded/coated triple lumen catheter was placed in the right internal jugular vein using the Seldinger technique.  Evaluation Blood flow good Complications: No apparent complications Patient did tolerate procedure well. Chest X-ray ordered to verify placement.  CXR: pending.  [name] 11/19/2015, 4:17 PM

## 2015-11-19 NOTE — Progress Notes (Signed)
Family updated.  Per family was conversant and very interactive prior to her last admit  Simonne Martinet ACNP-BC [facility] Pulmonary/Critical Care Pager # [phone] OR # [phone] if no answer

## 2015-11-19 NOTE — Progress Notes (Signed)
Patient transported to CT and back to [facility]. Vitals stable throughout trip.

## 2015-11-19 NOTE — ED Notes (Signed)
EEG technician at bedside with patient.

## 2015-11-19 NOTE — Consult Note (Signed)
Admission H&P    Chief Complaint: Altered mental status with abnormal EEG date of focal status epilepticus.  HPI: Megan [name] is an 82 y.o. female history of hypertension, diastolic dysfunction, hypothyroidism, type 2 diabetes mellitus and dementia who was discharged yesterday following management of an Escherichia coli UTI, and readmitted today after being found unresponsive. Patient was intubated on arrival in the ED. She has remained unresponsive except for noxious stimuli. CT scan of her head showed no acute intracranial abnormality. EEG showed marked asymmetry in cerebral activity with findings indicative of focal subclinical status epilepticus involving left hemisphere. Right hemisphere stroke cannot be ruled out. Patient has no history of seizure disorder. No frank clinical seizure activity has been reported.   Past Medical History  Diagnosis Date  . Hypertension   . Diastolic dysfunction, left ventricle by ECHO 2011 03/25/2012  . Hypothyroidism 03/25/2012  . High cholesterol   . Type II diabetes mellitus ([place])   . H/O hiatal hernia   . Duodenal perforation ([place]) 03/23/12  . Arthritis     "in my back"  . GERD (gastroesophageal reflux disease)   . Dementia     Past Surgical History  Procedure Laterality Date  . Bladder suspension    . Repair perforated ulcer  03/23/12  . Diagnostic laparoscopy  03/25/12    REPAIR OF PERFORATED ULCER with omental patch  . Abcess drainage  03/25/12     of abdominal & pelvic abscesses  . Laparoscopy  03/24/2012    Procedure: LAPAROSCOPY DIAGNOSTIC;  Surgeon: Adin Hector, MD;  Location: [place];  Service: General;  Laterality: N/A;  drainage of abdominal and pelvic abcesses  . Laparotomy  06/03/2012    Procedure: EXPLORATORY LAPAROTOMY;  Surgeon: Edward Jolly, MD;  Location: [facility] OR;  Service: General;  Laterality: N/A;    Family History  Problem Relation Age of Onset  . Hypertension Mother   . Hypertension Father   . Colon cancer Neg Hx    . Pancreatic cancer Neg Hx    Social History:  reports that she has never smoked. Her smokeless tobacco use includes Snuff. She reports that she does not drink alcohol or use illicit drugs.  Allergies:  Allergies  Allergen Reactions  . Cardura [Doxazosin] Other (See Comments)  . Nsaids Other (See Comments)    unknown    Medications Prior to Admission  Medication Sig Dispense Refill  . amLODipine (NORVASC) 10 MG tablet Take 1 tablet (10 mg total) by mouth daily.    Marland Kitchen aspirin 325 MG EC tablet Take 325 mg by mouth daily.    . Cholecalciferol (VITAMIN D3) 5000 UNITS CAPS Take 5,000 Units by mouth daily.    . clorazepate (TRANXENE-T) 3.75 MG tablet Take 1 tablet (3.75 mg total) by mouth 3 (three) times daily. 90 tablet 3  . donepezil (ARICEPT) 10 MG tablet TAKE 1 TABLET (10 MG TOTAL) BY MOUTH DAILY. 90 tablet 1  . feeding supplement, ENSURE ENLIVE, (ENSURE ENLIVE) LIQD Take 237 mLs by mouth 3 (three) times daily between meals. 237 mL 12  . haloperidol (HALDOL) 1 MG tablet Take 1 to 3 tablets/day if needed to control Agitation - need office visit before refill 20 tablet 0  . KLOR-CON M20 20 MEQ tablet TAKE 1 TABLET BY MOUTH TWICE A DAY 180 tablet 1  . levothyroxine (SYNTHROID, LEVOTHROID) 50 MCG tablet Take 1 tablet (50 mcg total) by mouth daily before breakfast. 90 tablet 3  . lisinopril (PRINIVIL,ZESTRIL) 40 MG tablet TAKE 1  TABLET (40 MG TOTAL) BY MOUTH DAILY. 90 tablet 1  . megestrol (MEGACE) 40 MG tablet Take 1 tablet (40 mg total) by mouth 2 (two) times daily. 30 tablet 0  . pantoprazole (PROTONIX) 40 MG tablet TAKE 1 TABLET DAILY TO PREVENT ULCER 90 tablet 3  . TRAVATAN Z 0.004 % SOLN ophthalmic solution Place 1 drop into both eyes at bedtime.       ROS: Unavailable.  Physical Examination: Blood pressure 166/66, pulse 73, temperature 100.5 F (38.1 C), temperature source Axillary, resp. rate 13, height 5' 8.11" (1.73 m), weight 49.1 kg (108 lb 3.9 oz), SpO2 99 %.  HEENT-   Normocephalic, no lesions, without obvious abnormality.  Normal external eye and conjunctiva.  Normal TM's bilaterally.  Normal auditory canals and external ears. Normal external nose, mucus membranes and septum.  Normal pharynx. Neck supple with no masses, nodes, nodules or enlargement. Cardiovascular - regular rate and rhythm, S1, S2 normal, no murmur, click, rub or gallop Lungs - scattered rhonchi, otherwise unremarkable Abdomen - soft, non-tender; bowel sounds normal; no masses,  no organomegaly Extremities - no joint deformities, effusion, or inflammation  Neurologic Examination: Patient was intubated and on mechanical ventilation. He did not respond to noxious stimuli. Pupils were very small and reactivity was difficult to determine. Pupils are also eccentric in location. Extraocular movements were intact with oculocephalic maneuvers. No facial asymmetry was noted. Muscle tone was flaccid throughout except for flexion of left upper extremity at the elbow with increased tone. There was no posturing of extremities otherwise. Deep tendon reflexes were 1+ and symmetrical. Plantar response on the left was extensor, and on the right mute.  Results for orders placed or performed during the hospital encounter of 11/19/15 (from the past 48 hour(s))  CBC with Differential     Status: Abnormal   Collection Time: 11/19/15  9:30 AM  Result Value Ref Range   WBC 9.2 4.0 - 10.5 K/uL   RBC 5.18 (H) 3.87 - 5.11 MIL/uL   Hemoglobin 15.6 (H) 12.0 - 15.0 g/dL   HCT 47.5 (H) 36.0 - 46.0 %   MCV 91.7 78.0 - 100.0 fL   MCH 30.1 26.0 - 34.0 pg   MCHC 32.8 30.0 - 36.0 g/dL   RDW 14.0 11.5 - 15.5 %   Platelets 153 150 - 400 K/uL   Neutrophils Relative % 78 %   Neutro Abs 7.1 1.7 - 7.7 K/uL   Lymphocytes Relative 17 %   Lymphs Abs 1.6 0.7 - 4.0 K/uL   Monocytes Relative 5 %   Monocytes Absolute 0.4 0.1 - 1.0 K/uL   Eosinophils Relative 0 %   Eosinophils Absolute 0.0 0.0 - 0.7 K/uL   Basophils  Relative 0 %   Basophils Absolute 0.0 0.0 - 0.1 K/uL  Comprehensive metabolic panel     Status: Abnormal   Collection Time: 11/19/15  9:30 AM  Result Value Ref Range   Sodium 142 135 - 145 mmol/L   Potassium 3.3 (L) 3.5 - 5.1 mmol/L   Chloride 102 101 - 111 mmol/L   CO2 29 22 - 32 mmol/L   Glucose, Bld 172 (H) 65 - 99 mg/dL   BUN <5 (L) 6 - 20 mg/dL   Creatinine, Ser 0.67 0.44 - 1.00 mg/dL   Calcium 9.5 8.9 - 10.3 mg/dL   Total Protein 7.4 6.5 - 8.1 g/dL   Albumin 2.7 (L) 3.5 - 5.0 g/dL   AST 17 15 - 41 U/L   ALT 8 (L)  14 - 54 U/L   Alkaline Phosphatase 67 38 - 126 U/L   Total Bilirubin 0.8 0.3 - 1.2 mg/dL   GFR calc non Af Amer >60 >60 mL/min   GFR calc Af Amer >60 >60 mL/min    Comment: (NOTE) The eGFR has been calculated using the CKD EPI equation. This calculation has not been validated in all clinical situations. eGFR's persistently <60 mL/min signify possible Chronic Kidney Disease.    Anion gap 11 5 - 15  I-Stat Troponin, ED (not at [facility])     Status: None   Collection Time: 11/19/15  9:58 AM  Result Value Ref Range   Troponin i, poc 0.04 0.00 - 0.08 ng/mL   Comment 3            Comment: Due to the release kinetics of cTnI, a negative result within the first hours of the onset of symptoms does not rule out myocardial infarction with certainty. If myocardial infarction is still suspected, repeat the test at appropriate intervals.   I-Stat CG4 Lactic Acid, ED     Status: Abnormal   Collection Time: 11/19/15 10:01 AM  Result Value Ref Range   Lactic Acid, Venous 3.53 (HH) 0.5 - 2.0 mmol/L   Comment NOTIFIED PHYSICIAN   Urinalysis, Routine w reflex microscopic (not at [facility])     Status: Abnormal   Collection Time: 11/19/15 10:26 AM  Result Value Ref Range   Color, Urine YELLOW YELLOW   APPearance CLEAR CLEAR   Specific Gravity, Urine 1.010 1.005 - 1.030   pH 8.0 5.0 - 8.0   Glucose, UA 250 (A) NEGATIVE mg/dL   Hgb urine dipstick LARGE (A) NEGATIVE   Bilirubin  Urine NEGATIVE NEGATIVE   Ketones, ur NEGATIVE NEGATIVE mg/dL   Protein, ur 100 (A) NEGATIVE mg/dL   Nitrite NEGATIVE NEGATIVE   Leukocytes, UA SMALL (A) NEGATIVE  Urine microscopic-add on     Status: Abnormal   Collection Time: 11/19/15 10:26 AM  Result Value Ref Range   Squamous Epithelial / LPF 6-30 (A) NONE SEEN   WBC, UA 6-30 0 - 5 WBC/hpf   RBC / HPF 6-30 0 - 5 RBC/hpf   Bacteria, UA NONE SEEN NONE SEEN  Triglycerides     Status: None   Collection Time: 11/19/15 10:52 AM  Result Value Ref Range   Triglycerides 92 <150 mg/dL  I-Stat arterial blood gas, ED     Status: Abnormal   Collection Time: 11/19/15 11:05 AM  Result Value Ref Range   pH, Arterial 7.509 (H) 7.350 - 7.450   pCO2 arterial 41.4 35.0 - 45.0 mmHg   pO2, Arterial 504.0 (H) 80.0 - 100.0 mmHg   Bicarbonate 32.4 (H) 20.0 - 24.0 mEq/L   TCO2 34 0 - 100 mmol/L   O2 Saturation 100.0 %   Acid-Base Excess 9.0 (H) 0.0 - 2.0 mmol/L   Patient temperature 102.4 F    Collection site RADIAL, ALLEN'S TEST ACCEPTABLE    Drawn by Operator    Sample type ARTERIAL   Magnesium     Status: Abnormal   Collection Time: 11/19/15 12:30 PM  Result Value Ref Range   Magnesium 1.4 (L) 1.7 - 2.4 mg/dL  Phosphorus     Status: None   Collection Time: 11/19/15 12:30 PM  Result Value Ref Range   Phosphorus 2.7 2.5 - 4.6 mg/dL  Lipase, blood     Status: None   Collection Time: 11/19/15 12:30 PM  Result Value Ref Range   Lipase  15 11 - 51 U/L  Ammonia     Status: None   Collection Time: 11/19/15 12:30 PM  Result Value Ref Range   Ammonia 25 9 - 35 umol/L  CK     Status: Abnormal   Collection Time: 11/19/15 12:30 PM  Result Value Ref Range   Total CK 34 (L) 38 - 234 U/L  I-Stat CG4 Lactic Acid, ED     Status: Abnormal   Collection Time: 11/19/15 12:52 PM  Result Value Ref Range   Lactic Acid, Venous 2.78 (HH) 0.5 - 2.0 mmol/L   Comment NOTIFIED PHYSICIAN   I-Stat arterial blood gas, ED     Status: Abnormal   Collection Time:  11/19/15  1:44 PM  Result Value Ref Range   pH, Arterial 7.485 (H) 7.350 - 7.450   pCO2 arterial 37.4 35.0 - 45.0 mmHg   pO2, Arterial 118.0 (H) 80.0 - 100.0 mmHg   Bicarbonate 27.6 (H) 20.0 - 24.0 mEq/L   TCO2 29 0 - 100 mmol/L   O2 Saturation 99.0 %   Acid-Base Excess 5.0 (H) 0.0 - 2.0 mmol/L   Patient temperature 39.3 C    Collection site RADIAL, ALLEN'S TEST ACCEPTABLE    Drawn by Operator    Sample type ARTERIAL   Glucose, capillary     Status: Abnormal   Collection Time: 11/19/15  8:10 PM  Result Value Ref Range   Glucose-Capillary 123 (H) 65 - 99 mg/dL   Ct Head Wo Contrast  11/19/2015  CLINICAL DATA:  Found unresponsive at nursing home EXAM: CT HEAD WITHOUT CONTRAST TECHNIQUE: Contiguous axial images were obtained from the base of the skull through the vertex without intravenous contrast. COMPARISON:  11/15/2015 FINDINGS: No skull fracture is noted. Paranasal sinuses and mastoid air cells are unremarkable. No intracranial hemorrhage, mass effect or midline shift. Stable cerebral atrophy. Atherosclerotic calcifications of carotid siphon again noted. Again noted chronic extensive white matter disease probable due to small vessel ischemic changes. There is no definite evidence of acute infarction. No mass lesion is noted on this unenhanced scan. Hyperostosis frontalis interna again noted. IMPRESSION: 1. No acute intracranial abnormality. Stable atrophy and extensive chronic white matter disease. No definite acute cortical infarction. No mass lesion is noted on this unenhanced scan. Electronically Signed   By: Natasha Mead M.D.   On: 11/19/2015 10:12   Dg Chest Port 1 View  11/19/2015  CLINICAL DATA:  Encounter for central line placement. EXAM: PORTABLE CHEST 1 VIEW COMPARISON:  11/19/2015 at 9:43 a.m. FINDINGS: New right internal jugular central venous line tip projects in the lower superior vena cava. No pneumothorax. A nasogastric tube has also been placed since the prior study passing  below the diaphragm to curl within the stomach. Endotracheal tube is stable with its tip approximately 5.2 cm above the Carina. Lungs are hyperexpanded with mild basilar atelectasis most evident on the left, unchanged. IMPRESSION: 1. New right internal jugular central venous line catheter tip projects in the lower superior vena cava. No pneumothorax. 2. Nasogastric tube passes below the diaphragm well within the stomach. 3. No other change from the earlier exam Electronically Signed   By: Amie Portland M.D.   On: 11/19/2015 12:23   Dg Chest Portable 1 View  11/19/2015  CLINICAL DATA:  Endotracheal tube placement EXAM: PORTABLE CHEST 1 VIEW COMPARISON:  11/15/2015 FINDINGS: New endotracheal tube with tip just below the clavicular heads. Cardiomegaly. Pulmonary artery enlargement suggesting hypertension. Mild left infrahilar streaky opacity, compatible with atelectasis. No  edema, effusion, or pneumothorax. IMPRESSION: 1. New endotracheal tube in unremarkable position. 2. Mild left basilar atelectasis. Electronically Signed   By: Monte Fantasia M.D.   On: 11/19/2015 10:00    Assessment/Plan 82 year old lady discharged yesterday following in patient management of Escherichia coli UTI, readmitted with unresponsiveness with asymmetric EEG findings indicative of a cold subclinical status epilepticus involving left hemisphere. Acute right hemisphere remote infarction cannot be ruled out, as well.  Recommendations: 1. MRI of the brain without contrast 2. EEG/video continuous long-term monitoring for seizure management 3. Loading dose of Keppra 1500 mg, followed by maintenance dose of 1000 mg every 12 hours  We will continue to follow this patient with you.This patient is critically ill and at significant risk of neurological worsening or death, and care requires constant monitoring of vital signs, hemodynamics,respiratory and cardiac monitoring, neurological assessment, discussion with family, other  specialists and medical decision making of high complexity. Total critical care time was 50 minutes.  C.R. Nicole Kindred, [facility] Neuro[facility]ospilalist [phone]  11/19/2015, 8:41 PM

## 2015-11-19 NOTE — Progress Notes (Signed)
Patient transported to 2M07 from ED on ventilator with this RT and RN. Vitals stable.

## 2015-11-19 NOTE — ED Notes (Signed)
MD with Critical care made aware patients BP keeps dropping with Labetolol drip.  Infusion currently stopped and bolus of fluids started.

## 2015-11-19 NOTE — ED Notes (Signed)
Critical care at bedside placing central line 

## 2015-11-19 NOTE — H&P (Signed)
PULMONARY / CRITICAL CARE MEDICINE   Name: Megan [name] MRN: [id number] DOB: 05-24-1927    ADMISSION DATE:  11/19/2015 CONSULTATION DATE:  1/17  REFERRING MD:  Allyne Gee  CHIEF COMPLAINT:  Acute encephalopathy   HISTORY OF PRESENT ILLNESS:   82 year old female discharged to SNF on 11/18/15 for weakness and poor PO intake, diagnosed with a UTI (E.coli). Presents 11/19/15 to ED from SNF via EMS. Upon arrival to ED patient was unresponsive (responsive to only painful stimuli) and hypertensive (Systolic BP 190-220). Intubated by ED attending for airway protection. PCCM to admit.   PAST MEDICAL HISTORY :  She  has a past medical history of Hypertension; Diastolic dysfunction, left ventricle by ECHO 2011 (03/25/2012); Hypothyroidism (03/25/2012); High cholesterol; Type II diabetes mellitus (HCC); H/O hiatal hernia; Duodenal perforation (HCC) (03/23/12); Arthritis; GERD (gastroesophageal reflux disease); and Dementia.  PAST SURGICAL HISTORY: She  has past surgical history that includes Bladder suspension; Repair perforated ulcer (03/23/12); Diagnostic laparoscopy (03/25/12); Abscess drainage (03/25/12); laparoscopy (03/24/2012); and laparotomy (06/03/2012).  Allergies  Allergen Reactions  . Cardura [Doxazosin] Other (See Comments)  . Nsaids Other (See Comments)    unknown    No current facility-administered medications on file prior to encounter.   Current Outpatient Prescriptions on File Prior to Encounter  Medication Sig  . amLODipine (NORVASC) 10 MG tablet Take 1 tablet (10 mg total) by mouth daily.  Marland Kitchen aspirin 325 MG EC tablet Take 325 mg by mouth daily.  . Cholecalciferol (VITAMIN D3) 5000 UNITS CAPS Take 5,000 Units by mouth daily.  . clorazepate (TRANXENE-T) 3.75 MG tablet Take 1 tablet (3.75 mg total) by mouth 3 (three) times daily.  Marland Kitchen donepezil (ARICEPT) 10 MG tablet TAKE 1 TABLET (10 MG TOTAL) BY MOUTH DAILY.  . feeding supplement, ENSURE ENLIVE, (ENSURE ENLIVE) LIQD Take 237 mLs by mouth  3 (three) times daily between meals.  . haloperidol (HALDOL) 1 MG tablet Take 1 to 3 tablets/day if needed to control Agitation - need office visit before refill  . KLOR-CON M20 20 MEQ tablet TAKE 1 TABLET BY MOUTH TWICE A DAY  . levothyroxine (SYNTHROID, LEVOTHROID) 50 MCG tablet Take 1 tablet (50 mcg total) by mouth daily before breakfast.  . lisinopril (PRINIVIL,ZESTRIL) 40 MG tablet TAKE 1 TABLET (40 MG TOTAL) BY MOUTH DAILY.  . megestrol (MEGACE) 40 MG tablet Take 1 tablet (40 mg total) by mouth 2 (two) times daily.  . pantoprazole (PROTONIX) 40 MG tablet TAKE 1 TABLET DAILY TO PREVENT ULCER  . TRAVATAN Z 0.004 % SOLN ophthalmic solution Place 1 drop into both eyes at bedtime.     FAMILY HISTORY:  Her has no family status information on file.   SOCIAL HISTORY: She  reports that she has never smoked. Her smokeless tobacco use includes Snuff. She reports that she does not drink alcohol or use illicit drugs.  REVIEW OF SYSTEMS:   unable  SUBJECTIVE:  Unresponsive   VITAL SIGNS: BP 99/63 mmHg  Pulse 65  Temp(Src) 102.2 F (39 C)  Resp 16  Ht 5' 8.11" (1.73 m)  SpO2 99%  HEMODYNAMICS:    VENTILATOR SETTINGS: Vent Mode:  [-] PRVC FiO2 (%):  [40 %-100 %] 40 % Set Rate:  [16 bmp] 16 bmp Vt Set:  [480 mL] 480 mL PEEP:  [5 cmH20] 5 cmH20 Plateau Pressure:  [16 cmH20] 16 cmH20  INTAKE / OUTPUT:    PHYSICAL EXAMINATION: General:  Obtunded critically ill female currently on ventilator support  Neuro:  Unresponsive, responds  to painful stimuli  HEENT: ETT, pupils 1mm bilaterally and non-responsive Cardiovascular: no edema, regular rate and rhythm  Lungs: diffuse scattered rhonchi, not using accessory muscles  Abdomen: flat, non-distended  Musculoskeletal: No visible joint deformities  Skin: Dry  LABS:  BMET  Recent Labs Lab 11/17/15 0416 11/17/15 1819 11/19/15 0930  NA 145 144 142  K 2.1* 3.0* 3.3*  CL 107 104 102  CO2 33* 30 29  BUN 7 7 <5*  CREATININE 0.49  0.54 0.67  GLUCOSE 118* 201* 172*    Electrolytes  Recent Labs Lab 11/17/15 0416 11/17/15 1125 11/17/15 1819 11/19/15 0930  CALCIUM 8.5*  --  8.7* 9.5  MG  --  1.7  --   --     CBC  Recent Labs Lab 11/15/15 2137 11/17/15 0416 11/19/15 0930  WBC 9.4 6.4 9.2  HGB 13.9 12.2 15.6*  HCT 42.3 37.7 47.5*  PLT 127* 130* 153    Coag's  Recent Labs Lab 11/15/15 2137  APTT 31  INR 1.14    Sepsis Markers  Recent Labs Lab 11/19/15 1001  LATICACIDVEN 3.53*    ABG  Recent Labs Lab 11/19/15 1105  PHART 7.509*  PCO2ART 41.4  PO2ART 504.0*    Liver Enzymes  Recent Labs Lab 11/15/15 2137 11/19/15 0930  AST 21 17  ALT 10* 8*  ALKPHOS 65 67  BILITOT 1.9* 0.8  ALBUMIN 2.8* 2.7*    Cardiac Enzymes No results for input(s): TROPONINI, PROBNP in the last 168 hours.  Glucose  Recent Labs Lab 11/16/15 1213 11/16/15 1651 11/16/15 2239 11/17/15 2156 11/18/15 0729 11/18/15 1134  GLUCAP 121* 167* 154* 174* 156* 147*    Imaging Ct Head Wo Contrast  11/19/2015  CLINICAL DATA:  Found unresponsive at nursing home EXAM: CT HEAD WITHOUT CONTRAST TECHNIQUE: Contiguous axial images were obtained from the base of the skull through the vertex without intravenous contrast. COMPARISON:  11/15/2015 FINDINGS: No skull fracture is noted. Paranasal sinuses and mastoid air cells are unremarkable. No intracranial hemorrhage, mass effect or midline shift. Stable cerebral atrophy. Atherosclerotic calcifications of carotid siphon again noted. Again noted chronic extensive white matter disease probable due to small vessel ischemic changes. There is no definite evidence of acute infarction. No mass lesion is noted on this unenhanced scan. Hyperostosis frontalis interna again noted. IMPRESSION: 1. No acute intracranial abnormality. Stable atrophy and extensive chronic white matter disease. No definite acute cortical infarction. No mass lesion is noted on this unenhanced scan.  Electronically Signed   By: Natasha Mead M.D.   On: 11/19/2015 10:12   Dg Chest Portable 1 View  11/19/2015  CLINICAL DATA:  Endotracheal tube placement EXAM: PORTABLE CHEST 1 VIEW COMPARISON:  11/15/2015 FINDINGS: New endotracheal tube with tip just below the clavicular heads. Cardiomegaly. Pulmonary artery enlargement suggesting hypertension. Mild left infrahilar streaky opacity, compatible with atelectasis. No edema, effusion, or pneumothorax. IMPRESSION: 1. New endotracheal tube in unremarkable position. 2. Mild left basilar atelectasis. Electronically Signed   By: Marnee Spring M.D.   On: 11/19/2015 10:00     STUDIES:  CT head 1/17: no acute finding. Extensive white matter disease  EEG 1/17>>> Ammonia 1/17>>>  CULTURES: BCX2 1/17>>> UC x2 1/16>>>  ANTIBIOTICS: Zosyn 1/17>>> vanc 1/17>>  SIGNIFICANT EVENTS:   LINES/TUBES: Right ij CVL 1/17>>> OETT 1/17>>>  DISCUSSION: 82 year old female presenting with alerted mental status found to be unresponsive upon arrival to the ED from SNF. Etiology unclear. Currently ruling out infection, seizure, polypharmacy, and PRES. Will  continue supportive care. Will need MRI.   ASSESSMENT / PLAN:  PULMONARY A: Ventilator Dependency R/T inability to protect airway CXR 11/19/2015 - Mild acelectasis P:   -Full Vent support  -PAD protocol  -Full up ABG  -AM Chest Xray  CARDIOVASCULAR A:  Hypertensive Crisis, Initial Cardiac Enzymes Negative Tachycardia P:  -Labetalol Infusion Titrate for Systolic Goal 145-165 (25 % reduction) -Telemetry  -2D Echo   RENAL A:   Hypokalemia  Contraction Alkalosis  P:   -40 meq oral potassium  -IV hydration -F/U Chem in AM  GASTROINTESTINAL A:   Severe Protein Calorie Malnutrition  H/O GERD P:   -Consult Nutrition for tube feeds -Protonix 40 mg IV  INFECTIOUS A:   Febrile (Rectal Temp 102.2) P:   -Blood Cultures x2 11/19/15 -Zoysn 11/19/15 -Vancomycin 11/19/15  ENDOCRINE A:    Hyperglycemia/ DM type 2  H/O Hypothyroidism    P:   -CBG q 4 hours -Continue home dose synthroid   NEUROLOGIC A:   Acute Encephalopathy - Unresponsive etiology unclear rule out seizure verus HTN crisis or PRES or Infection vs polypharmacy, CT Head 11/19/15 White Matter Changes  P:   -EEG  -CK Level -Ammonia Level  RASS goal: -2   FAMILY  - Updates: Pending   - Inter-disciplinary family meet or Palliative Care meeting due by: 11/26/2015    Pulmonary and Critical Care Medicine [facility] Pager: [phone]  11/19/2015, 11:53 AM

## 2015-11-19 NOTE — ED Notes (Signed)
Pt presents via [facility] from Nursing Facility for altered mental status-unresponsive on EMS arrival.  Pt was d/c yesterday with dx UTI.  Unknown baseline per EMS and nursing facility staff.  Pt responsive to pain only, unable to follow commands.  BP-220/110 P-110 O2-98% RA, CBG-140, pupils constricted and equal.  Hx: dimentia.  24 L H.  MD at bedside on arrival.

## 2015-11-19 NOTE — Progress Notes (Signed)
LTM hooked up  

## 2015-11-19 NOTE — Progress Notes (Signed)
EEG completed; results pending.    

## 2015-11-19 NOTE — Procedures (Signed)
History: 82 year old female admitted with unresponsiveness  Sedation: None  Technique: This is a 21 channel routine scalp EEG performed at the bedside with bipolar and monopolar montages arranged in accordance to the international 10/20 system of electrode placement. One channel was dedicated to EKG recording.    Background: The EEG is markedly asymmetric with the right hemisphere being relatively attenuated with generalized irregular delta activity. The left hemisphere demonstrates high-voltage delta and theta activity with superimposed sharply contoured beta at times. This activity is most prominent at P3, T7, P7, F7. There is at least one run of periodic higher voltage sharp wave discharges in the same distribution with a frequency of 1 Hz lasting 20-30 seconds. This pattern varies in amplitude, but is present to some degree throughout the study.  Photic stimulation: Physiologic driving is not performed  EEG Abnormalities: 1) sharply contoured, at times periodic activity in the left temporoparietal region as described above 2) and duration of faster frequencies on the right  Clinical Interpretation: This EEG is markedly asymmetric. 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   There is no clear evolution, and it could be possible that the right side is the more abnormal side with severe cortical attenuation(e.g. Right hemispheric stroke) in a more generalized severe encephalopathy giving rise to the abnormal discharges on the left.  My suspicion, however, is that this does represent focal status epilepticus.     The results of the study were discussed with primary team at the time of interpretation. Neurology will consult.  Ritta Slot, MD [facility] [phone]  If 7pm- 7am,  please page neurology on call as listed in AMION.

## 2015-11-19 NOTE — Progress Notes (Signed)
ANTIBIOTIC CONSULT NOTE - INITIAL  Pharmacy Consult for vancomycin and Zosyn Indication: rule out sepsis  Allergies  Allergen Reactions  . Cardura [Doxazosin] Other (See Comments)  . Nsaids Other (See Comments)    unknown    Patient Measurements:    Vital Signs: BP: 181/132 mmHg (01/17 1015) Pulse Rate: 56 (01/17 1015) Intake/Output from previous day:   Intake/Output from this shift:    Labs:  Recent Labs  11/17/15 0416 11/17/15 1819 11/19/15 0930  WBC 6.4  --  9.2  HGB 12.2  --  15.6*  PLT 130*  --  153  CREATININE 0.49 0.54 0.67   CrCl cannot be calculated (Unknown ideal weight.). No results for input(s): VANCOTROUGH, VANCOPEAK, VANCORANDOM, GENTTROUGH, GENTPEAK, GENTRANDOM, TOBRATROUGH, TOBRAPEAK, TOBRARND, AMIKACINPEAK, AMIKACINTROU, AMIKACIN in the last 72 hours.   Microbiology: Recent Results (from the past 720 hour(s))  Urine culture     Status: None   Collection Time: 11/15/15 10:28 PM  Result Value Ref Range Status   Specimen Description URINE, CATHETERIZED  Final   Special Requests NONE  Final   Culture >=100,000 COLONIES/mL ESCHERICHIA COLI  Final   Report Status 11/18/2015 FINAL  Final   Organism ID, Bacteria ESCHERICHIA COLI  Final      Susceptibility   Escherichia coli - MIC*    AMPICILLIN <=2 SENSITIVE Sensitive     CEFAZOLIN <=4 SENSITIVE Sensitive     CEFTRIAXONE <=1 SENSITIVE Sensitive     CIPROFLOXACIN <=0.25 SENSITIVE Sensitive     GENTAMICIN <=1 SENSITIVE Sensitive     IMIPENEM <=0.25 SENSITIVE Sensitive     NITROFURANTOIN <=16 SENSITIVE Sensitive     TRIMETH/SULFA <=20 SENSITIVE Sensitive     AMPICILLIN/SULBACTAM <=2 SENSITIVE Sensitive     PIP/TAZO <=4 SENSITIVE Sensitive     * >=100,000 COLONIES/mL ESCHERICHIA COLI    Medical History: Past Medical History  Diagnosis Date  . Hypertension   . Diastolic dysfunction, left ventricle by ECHO 2011 03/25/2012  . Hypothyroidism 03/25/2012  . High cholesterol   . Type II diabetes  mellitus (HCC)   . H/O hiatal hernia   . Duodenal perforation (HCC) 03/23/12  . Arthritis     "in my back"  . GERD (gastroesophageal reflux disease)   . Dementia     Medications:   (Not in a hospital admission) Scheduled:  . fentaNYL      . naloxone       Assessment: Megan [name], pharmacy consulted for Vancomycin and Zosyn dosing for r/o sepsis Recently completed abx for UTI (Rocephin)  Goal of Therapy:  Vancomycin trough level 15-20 mcg/ml  Plan:  Starting Zosyn 3.375 g over 30 min x 1, then Zosyn 3.375 g over 4 hours q 8 hours Starting vancomycin 1 gram x 1, then 750 mg q 24 hr Measure antibiotic drug levels at steady state Follow up culture results, renal fx F/U C&S, abx deescalation / LOT   Thank you for allowing [place] to participate in this patients care. Signe Colt, PharmD  11/19/2015,10:33 AM

## 2015-11-19 NOTE — ED Provider Notes (Addendum)
CSN: [id number]     Arrival date & time 11/19/15  9147 History   First MD Initiated Contact with Patient 11/19/15 [id number]     Chief Complaint  Patient presents with  . Altered Mental Status     (Consider location/radiation/quality/duration/timing/severity/associated sxs/prior Treatment) Patient is a 82 y.o. female presenting with altered mental status. The history is provided by medical records and the EMS personnel.  Altered Mental Status Presenting symptoms: unresponsiveness   Presenting symptoms: no behavior changes   Severity:  Severe Most recent episode:  Today Episode history:  Unable to specify Timing:  Unable to specify Chronicity:  New Context: not alcohol use and not dementia     Past Medical History  Diagnosis Date  . Hypertension   . Diastolic dysfunction, left ventricle by ECHO 2011 03/25/2012  . Hypothyroidism 03/25/2012  . High cholesterol   . Type II diabetes mellitus (HCC)   . H/O hiatal hernia   . Duodenal perforation (HCC) 03/23/12  . Arthritis     "in my back"  . GERD (gastroesophageal reflux disease)   . Dementia    Past Surgical History  Procedure Laterality Date  . Bladder suspension    . Repair perforated ulcer  03/23/12  . Diagnostic laparoscopy  03/25/12    REPAIR OF PERFORATED ULCER with omental patch  . Abcess drainage  03/25/12     of abdominal & pelvic abscesses  . Laparoscopy  03/24/2012    Procedure: LAPAROSCOPY DIAGNOSTIC;  Surgeon: Ardeth Sportsman, MD;  Location: [facility] OR;  Service: General;  Laterality: N/A;  drainage of abdominal and pelvic abcesses  . Laparotomy  06/03/2012    Procedure: EXPLORATORY LAPAROTOMY;  Surgeon: Mariella Saa, MD;  Location: [facility] OR;  Service: General;  Laterality: N/A;   Family History  Problem Relation Age of Onset  . Hypertension Mother   . Hypertension Father   . Colon cancer Neg Hx   . Pancreatic cancer Neg Hx    Social History  Substance Use Topics  . Smoking status: Never Smoker   . Smokeless  tobacco: Current User    Types: Snuff     Comment: 03/28/12 "use snuff q once in while; not regular"  . Alcohol Use: No   OB History    No data available     Review of Systems  All other systems reviewed and are negative.     Allergies  Cardura and Nsaids  Home Medications   Prior to Admission medications   Medication Sig Start Date End Date Taking? Authorizing Provider  amLODipine (NORVASC) 10 MG tablet Take 1 tablet (10 mg total) by mouth daily. 11/19/15  Yes Calvert Cantor, MD  aspirin 325 MG EC tablet Take 325 mg by mouth daily.   Yes Historical Provider, MD  Cholecalciferol (VITAMIN D3) 5000 UNITS CAPS Take 5,000 Units by mouth daily.   Yes Historical Provider, MD  clorazepate (TRANXENE-T) 3.75 MG tablet Take 1 tablet (3.75 mg total) by mouth 3 (three) times daily. 12/19/14  Yes Quentin Mulling, PA-C  donepezil (ARICEPT) 10 MG tablet TAKE 1 TABLET (10 MG TOTAL) BY MOUTH DAILY. 05/08/15  Yes Lucky Cowboy, MD  feeding supplement, ENSURE ENLIVE, (ENSURE ENLIVE) LIQD Take 237 mLs by mouth 3 (three) times daily between meals. 11/18/15  Yes Calvert Cantor, MD  haloperidol (HALDOL) 1 MG tablet Take 1 to 3 tablets/day if needed to control Agitation - need office visit before refill 10/20/15  Yes Lucky Cowboy, MD  KLOR-CON M20 20 MEQ tablet TAKE 1  TABLET BY MOUTH TWICE A DAY 09/09/15  Yes Lucky Cowboy, MD  levothyroxine (SYNTHROID, LEVOTHROID) 50 MCG tablet Take 1 tablet (50 mcg total) by mouth daily before breakfast. 12/21/14  Yes Lucky Cowboy, MD  lisinopril (PRINIVIL,ZESTRIL) 40 MG tablet TAKE 1 TABLET (40 MG TOTAL) BY MOUTH DAILY. 09/02/15  Yes Lucky Cowboy, MD  megestrol (MEGACE) 40 MG tablet Take 1 tablet (40 mg total) by mouth 2 (two) times daily. 11/18/15  Yes Calvert Cantor, MD  pantoprazole (PROTONIX) 40 MG tablet TAKE 1 TABLET DAILY TO PREVENT ULCER 12/19/14  Yes Quentin Mulling, PA-C  TRAVATAN Z 0.004 % SOLN ophthalmic solution Place 1 drop into both eyes at bedtime.  04/13/14   Yes Historical Provider, MD   BP 148/62 mmHg  Pulse 67  Temp(Src) 102.2 F (39 C)  Resp 11  Ht 5' 8.11" (1.73 m)  SpO2 99% Physical Exam  Constitutional: She appears well-developed and well-nourished.  HENT:  Head: Normocephalic and atraumatic.  Eyes: Conjunctivae are normal.  Neck: Normal range of motion.  Cardiovascular: Normal rate and regular rhythm.   Pulmonary/Chest: Effort normal and breath sounds normal. No stridor. No respiratory distress.  Abdominal: Soft. She exhibits no distension.  Musculoskeletal: She exhibits no edema.  Neurological: GCS eye subscore is 1. GCS verbal subscore is 1. GCS motor subscore is 1.  Skin: Skin is warm and dry.  Nursing note and vitals reviewed.   ED Course  Procedures (including critical care time)  CRITICAL CARE Performed by: Marily Memos   Total critical care time: 45 minutes Critical care time was exclusive of separately billable procedures and treating other patients. Critical care was necessary to treat or prevent imminent or life-threatening deterioration. Critical care was time spent personally by me on the following activities: development of treatment plan with patient and/or surrogate as well as nursing, discussions with consultants, evaluation of patient's response to treatment, examination of patient, obtaining history from patient or surrogate, ordering and performing treatments and interventions, ordering and review of laboratory studies, ordering and review of radiographic studies, pulse oximetry and re-evaluation of patient's condition.  Angiocath insertion Performed by: Marily Memos  Consent: emergent. Risks and benefits: risks, benefits and alternatives were discussed Time out: Immediately prior to procedure a "time out" was called to verify the correct patient, procedure, equipment, support staff and site/side marked as required.  Preparation: Patient was prepped and draped in the usual sterile fashion.  Vein  Location: right brachial  Ultrasound Guided  Gauge: 20  Normal blood return and flush without difficulty Patient tolerance: Patient tolerated the procedure well with no immediate complications.     Labs Review Labs Reviewed  CBC WITH DIFFERENTIAL/PLATELET - Abnormal; Notable for the following:    RBC 5.18 (*)    Hemoglobin 15.6 (*)    HCT 47.5 (*)    All other components within normal limits  COMPREHENSIVE METABOLIC PANEL - Abnormal; Notable for the following:    Potassium 3.3 (*)    Glucose, Bld 172 (*)    BUN <5 (*)    Albumin 2.7 (*)    ALT 8 (*)    All other components within normal limits  URINALYSIS, ROUTINE W REFLEX MICROSCOPIC (NOT AT [facility]) - Abnormal; Notable for the following:    Glucose, UA 250 (*)    Hgb urine dipstick LARGE (*)    Protein, ur 100 (*)    Leukocytes, UA SMALL (*)    All other components within normal limits  URINE MICROSCOPIC-ADD ON - Abnormal; Notable  for the following:    Squamous Epithelial / LPF 6-30 (*)    All other components within normal limits  MAGNESIUM - Abnormal; Notable for the following:    Magnesium 1.4 (*)    All other components within normal limits  CK - Abnormal; Notable for the following:    Total CK 34 (*)    All other components within normal limits  I-STAT CG4 LACTIC ACID, ED - Abnormal; Notable for the following:    Lactic Acid, Venous 3.53 (*)    All other components within normal limits  I-STAT ARTERIAL BLOOD GAS, ED - Abnormal; Notable for the following:    pH, Arterial 7.509 (*)    pO2, Arterial 504.0 (*)    Bicarbonate 32.4 (*)    Acid-Base Excess 9.0 (*)    All other components within normal limits  I-STAT CG4 LACTIC ACID, ED - Abnormal; Notable for the following:    Lactic Acid, Venous 2.78 (*)    All other components within normal limits  I-STAT ARTERIAL BLOOD GAS, ED - Abnormal; Notable for the following:    pH, Arterial 7.485 (*)    pO2, Arterial 118.0 (*)    Bicarbonate 27.6 (*)    Acid-Base Excess  5.0 (*)    All other components within normal limits  URINE CULTURE  CULTURE, BLOOD (ROUTINE X 2)  CULTURE, BLOOD (ROUTINE X 2)  TRIGLYCERIDES  PHOSPHORUS  LIPASE, BLOOD  AMMONIA  BLOOD GAS, ARTERIAL  I-STAT TROPOININ, ED    Imaging Review Ct Head Wo Contrast  11/19/2015  CLINICAL DATA:  Found unresponsive at nursing home EXAM: CT HEAD WITHOUT CONTRAST TECHNIQUE: Contiguous axial images were obtained from the base of the skull through the vertex without intravenous contrast. COMPARISON:  11/15/2015 FINDINGS: No skull fracture is noted. Paranasal sinuses and mastoid air cells are unremarkable. No intracranial hemorrhage, mass effect or midline shift. Stable cerebral atrophy. Atherosclerotic calcifications of carotid siphon again noted. Again noted chronic extensive white matter disease probable due to small vessel ischemic changes. There is no definite evidence of acute infarction. No mass lesion is noted on this unenhanced scan. Hyperostosis frontalis interna again noted. IMPRESSION: 1. No acute intracranial abnormality. Stable atrophy and extensive chronic white matter disease. No definite acute cortical infarction. No mass lesion is noted on this unenhanced scan. Electronically Signed   By: Natasha Mead M.D.   On: 11/19/2015 10:12   Dg Chest Port 1 View  11/19/2015  CLINICAL DATA:  Encounter for central line placement. EXAM: PORTABLE CHEST 1 VIEW COMPARISON:  11/19/2015 at 9:43 a.m. FINDINGS: New right internal jugular central venous line tip projects in the lower superior vena cava. No pneumothorax. A nasogastric tube has also been placed since the prior study passing below the diaphragm to curl within the stomach. Endotracheal tube is stable with its tip approximately 5.2 cm above the Carina. Lungs are hyperexpanded with mild basilar atelectasis most evident on the left, unchanged. IMPRESSION: 1. New right internal jugular central venous line catheter tip projects in the lower superior vena cava.  No pneumothorax. 2. Nasogastric tube passes below the diaphragm well within the stomach. 3. No other change from the earlier exam Electronically Signed   By: Amie Portland M.D.   On: 11/19/2015 12:23   Dg Chest Portable 1 View  11/19/2015  CLINICAL DATA:  Endotracheal tube placement EXAM: PORTABLE CHEST 1 VIEW COMPARISON:  11/15/2015 FINDINGS: New endotracheal tube with tip just below the clavicular heads. Cardiomegaly. Pulmonary artery enlargement suggesting hypertension. Mild  left infrahilar streaky opacity, compatible with atelectasis. No edema, effusion, or pneumothorax. IMPRESSION: 1. New endotracheal tube in unremarkable position. 2. Mild left basilar atelectasis. Electronically Signed   By: Marnee Spring M.D.   On: 11/19/2015 10:00   I have personally reviewed and evaluated these images and lab results as part of my medical decision-making.   EKG Interpretation   Date/Time:  Tuesday November 19 2015 09:08:10 EST Ventricular Rate:  103 PR Interval:  142 QRS Duration: 102 QT Interval:  384 QTC Calculation: 503 R Axis:   71 Text Interpretation:  Sinus tachycardia with irregular rate Nonspecific  repol abnormality, diffuse leads Prolonged QT interval Confirmed by [facility]  MD, Barbara Cower [id number]) on 11/19/2015 10:10:46 AM      MDM   Final diagnoses:  Acute encephalopathy  Difficult intravenous access  Hypokalemia  Required emergent intubation  Secondary hypertension, unspecified    A 74-year-old female recently discharged from here secondary to urinary tract infection to the skilled nursing facility where she was residing overnight this morning she was found to be unresponsive and EMS was called and sent here for further evaluation. On initial arrival here patient was unresponsive and significantly hypertensive. Initial thought was head bleed versus hypertensive emergency as her causes. Patient was intubated for airway protection per the mid-level's intubation note. Patient was started on  propofol and labetalol for sedation and blood pressure control respectively. CT did show any evidence of head bleed when compared to previously it was similar. Discussed case with intensive care and admitted for further management of her likely hypertensive urgency versus posterior reversible encephalopathic syndrome.    Marily Memos, MD 11/20/15 [phone]

## 2015-11-19 NOTE — ED Provider Notes (Signed)
  Physical Exam  BP 152/74 mmHg  Pulse 65  Temp(Src) 102.6 F (39.2 C)  Resp 20  Ht 5' 8.11" (1.73 m)  SpO2 100%  Physical Exam  ED Course  OG placement Date/Time: 11/19/2015 10:15 AM Performed by: Garlon Hatchet Authorized by: Garlon Hatchet Consent: The procedure was performed in an emergent situation. Verbal consent not obtained. Written consent not obtained. Required items: required blood products, implants, devices, and special equipment available Patient identity confirmed: arm band Time out: Immediately prior to procedure a "time out" was called to verify the correct patient, procedure, equipment, support staff and site/side marked as required. Preparation: Patient was prepped and draped in the usual sterile fashion. Patient tolerance: Patient tolerated the procedure well with no immediate complications    INTUBATION Performed by: Garlon Hatchet  Required items: required blood products, implants, devices, and special equipment available Patient identity confirmed: provided demographic data and hospital-assigned identification number Time out: Immediately prior to procedure a "time out" was called to verify the correct patient, procedure, equipment, support staff and site/side marked as required.  Indications: AMS, airway protection  Intubation method: Glidescope Laryngoscopy   Preoxygenation: BVM  Sedatives: Etomidate Paralytic: Rocuronium  Tube Size: 7.5 cuffed  Post-procedure assessment: chest rise and ETCO2 monitor Breath sounds: equal and absent over the epigastrium Tube secured with: ETT holder Chest x-ray interpreted by radiologist and me.  Chest x-ray findings: endotracheal tube in appropriate position  Patient tolerated the procedure well with no immediate complications.   Garlon Hatchet, PA-C 11/19/15 1507  Marily Memos, MD 11/20/15 [phone]

## 2015-11-20 ENCOUNTER — Encounter: Payer: Self-pay | Admitting: Internal Medicine

## 2015-11-20 ENCOUNTER — Inpatient Hospital Stay (HOSPITAL_COMMUNITY): Payer: Medicare Other

## 2015-11-20 DIAGNOSIS — L8990 Pressure ulcer of unspecified site, unspecified stage: Secondary | ICD-10-CM | POA: Diagnosis present

## 2015-11-20 DIAGNOSIS — E876 Hypokalemia: Secondary | ICD-10-CM

## 2015-11-20 LAB — BLOOD GAS, ARTERIAL
Acid-Base Excess: 1.5 mmol/L (ref 0.0–2.0)
BICARBONATE: 24.6 meq/L — AB (ref 20.0–24.0)
Drawn by: 42624
FIO2: 0.3
LHR: 11 {breaths}/min
MECHVT: 480 mL
O2 Saturation: 98.6 %
PATIENT TEMPERATURE: 98.6
PCO2 ART: 32.9 mmHg — AB (ref 35.0–45.0)
PEEP: 5 cmH2O
TCO2: 25.6 mmol/L (ref 0–100)
pH, Arterial: 7.487 — ABNORMAL HIGH (ref 7.350–7.450)
pO2, Arterial: 116 mmHg — ABNORMAL HIGH (ref 80.0–100.0)

## 2015-11-20 LAB — CBC
HEMATOCRIT: 36.6 % (ref 36.0–46.0)
HEMOGLOBIN: 11.9 g/dL — AB (ref 12.0–15.0)
MCH: 29.6 pg (ref 26.0–34.0)
MCHC: 32.5 g/dL (ref 30.0–36.0)
MCV: 91 fL (ref 78.0–100.0)
Platelets: 141 10*3/uL — ABNORMAL LOW (ref 150–400)
RBC: 4.02 MIL/uL (ref 3.87–5.11)
RDW: 14.5 % (ref 11.5–15.5)
WBC: 10.6 10*3/uL — ABNORMAL HIGH (ref 4.0–10.5)

## 2015-11-20 LAB — LACTIC ACID, PLASMA: LACTIC ACID, VENOUS: 1.5 mmol/L (ref 0.5–2.0)

## 2015-11-20 LAB — GLUCOSE, CAPILLARY
GLUCOSE-CAPILLARY: 154 mg/dL — AB (ref 65–99)
GLUCOSE-CAPILLARY: 162 mg/dL — AB (ref 65–99)
GLUCOSE-CAPILLARY: 182 mg/dL — AB (ref 65–99)
Glucose-Capillary: 198 mg/dL — ABNORMAL HIGH (ref 65–99)
Glucose-Capillary: 195 mg/dL — ABNORMAL HIGH (ref 65–99)
Glucose-Capillary: 163 mg/dL — ABNORMAL HIGH (ref 65–99)

## 2015-11-20 LAB — BASIC METABOLIC PANEL
Anion gap: 6 (ref 5–15)
BUN: 15 mg/dL (ref 6–20)
CHLORIDE: 109 mmol/L (ref 101–111)
CO2: 27 mmol/L (ref 22–32)
CREATININE: 0.84 mg/dL (ref 0.44–1.00)
Calcium: 8.5 mg/dL — ABNORMAL LOW (ref 8.9–10.3)
GFR calc Af Amer: >60 mL/min (ref >60)
GFR calc non Af Amer: >60 mL/min (ref >60)
Glucose, Bld: 228 mg/dL — ABNORMAL HIGH (ref 65–99)
POTASSIUM: 3.2 mmol/L — AB (ref 3.5–5.1)
Sodium: 142 mmol/L (ref 135–145)

## 2015-11-20 LAB — TRIGLYCERIDES: TRIGLYCERIDES: 65 mg/dL (ref <150)

## 2015-11-20 LAB — MAGNESIUM: Magnesium: 2.4 mg/dL (ref 1.7–2.4)

## 2015-11-20 LAB — PHOSPHORUS: Phosphorus: 2.5 mg/dL (ref 2.5–4.6)

## 2015-11-20 LAB — URINE CULTURE

## 2015-11-20 LAB — PROCALCITONIN: Procalcitonin: 0.27 ng/mL

## 2015-11-20 MED ORDER — SODIUM CHLORIDE 0.9 % IV SOLN
1000.0000 mg | Freq: Two times a day (BID) | INTRAVENOUS | Status: DC
  Administered 2015-11-20 – 2015-12-02 (×24): 1000 mg via INTRAVENOUS
  Filled 2015-11-20 (×28): qty 10

## 2015-11-20 MED ORDER — INSULIN ASPART 100 UNIT/ML ~~LOC~~ SOLN
0.0000 [IU] | SUBCUTANEOUS | Status: DC
  Administered 2015-11-20 (×3): 2 [IU] via SUBCUTANEOUS
  Administered 2015-11-21 (×3): 1 [IU] via SUBCUTANEOUS
  Administered 2015-11-21: 2 [IU] via SUBCUTANEOUS
  Administered 2015-11-22 (×4): 1 [IU] via SUBCUTANEOUS
  Administered 2015-11-23 (×2): 2 [IU] via SUBCUTANEOUS
  Administered 2015-11-23: 1 [IU] via SUBCUTANEOUS
  Administered 2015-11-23 (×2): 2 [IU] via SUBCUTANEOUS
  Administered 2015-11-24 (×2): 1 [IU] via SUBCUTANEOUS
  Administered 2015-11-24 (×2): 2 [IU] via SUBCUTANEOUS
  Administered 2015-11-24 – 2015-11-26 (×12): 1 [IU] via SUBCUTANEOUS
  Administered 2015-11-26: 2 [IU] via SUBCUTANEOUS
  Administered 2015-11-27: 1 [IU] via SUBCUTANEOUS
  Administered 2015-11-27: 2 [IU] via SUBCUTANEOUS
  Administered 2015-11-27 – 2015-11-30 (×7): 1 [IU] via SUBCUTANEOUS
  Administered 2015-11-30: 2 [IU] via SUBCUTANEOUS
  Administered 2015-12-01 (×4): 1 [IU] via SUBCUTANEOUS
  Administered 2015-12-02: 2 [IU] via SUBCUTANEOUS
  Administered 2015-12-02 (×2): 1 [IU] via SUBCUTANEOUS
  Administered 2015-12-02: 2 [IU] via SUBCUTANEOUS
  Administered 2015-12-03 (×2): 1 [IU] via SUBCUTANEOUS
  Administered 2015-12-03: 2 [IU] via SUBCUTANEOUS
  Administered 2015-12-03: 1 [IU] via SUBCUTANEOUS
  Administered 2015-12-04: 2 [IU] via SUBCUTANEOUS

## 2015-11-20 MED ORDER — VITAL AF 1.2 CAL PO LIQD
1000.0000 mL | ORAL | Status: DC
  Administered 2015-11-20 – 2015-11-26 (×8): 1000 mL
  Filled 2015-11-20: qty 1000

## 2015-11-20 MED ORDER — PANTOPRAZOLE SODIUM 40 MG PO PACK
40.0000 mg | PACK | Freq: Every day | ORAL | Status: DC
  Administered 2015-11-20 – 2015-11-26 (×7): 40 mg
  Filled 2015-11-20 (×8): qty 20

## 2015-11-20 MED ORDER — SODIUM CHLORIDE 0.9 % IV SOLN: 1000.0000 mg | Freq: Two times a day (BID) | INTRAVENOUS | Status: DC

## 2015-11-20 MED ORDER — SODIUM CHLORIDE 0.9 % IV SOLN: 1000.0000 mg | INTRAVENOUS | Status: DC

## 2015-11-20 MED ORDER — PROPOFOL 1000 MG/100ML IV EMUL
0.0000 ug/kg/min | INTRAVENOUS | Status: DC
  Administered 2015-11-20: 20 ug/kg/min via INTRAVENOUS
  Administered 2015-11-21 – 2015-11-22 (×8): 60 ug/kg/min via INTRAVENOUS
  Filled 2015-11-20 (×9): qty 100

## 2015-11-20 MED ORDER — POTASSIUM CHLORIDE 20 MEQ/15ML (10%) PO SOLN
40.0000 meq | Freq: Once | ORAL | Status: AC
  Administered 2015-11-20: 40 meq
  Filled 2015-11-20: qty 30

## 2015-11-20 NOTE — Progress Notes (Signed)
Initial Nutrition Assessment  DOCUMENTATION CODES:   Severe malnutrition in context of chronic illness, Underweight  INTERVENTION:  Discontinue Vital High Protein.  Initiate Vital AF 1.2 formula via OGT at goal rate of 50 ml/hr to provide 1440 kcal, 90 grams of protein, and 972 ml of free water.   RD to continue to monitor.   NUTRITION DIAGNOSIS:   Malnutrition related to chronic illness as evidenced by severe depletion of body fat, severe depletion of muscle mass.  GOAL:   Patient will meet greater than or equal to 90% of their needs  MONITOR:   Vent status, TF tolerance, Skin, Weight trends, Labs, I & O's  REASON FOR ASSESSMENT:   Consult Enteral/tube feeding initiation and management  ASSESSMENT:   82 y.o. female history of hypertension, diastolic dysfunction, hypothyroidism, type 2 diabetes mellitus and dementia who was discharged yesterday following management of an Escherichia coli UTI, and readmitted today after being found unresponsive. Patient was intubated on arrival in the ED.  EEG showed marked asymmetry in cerebral activity with findings indicative of focal subclinical status epilepticus involving left hemisphere.  Patient is currently intubated on ventilator support MV: 5.3 L/min Temp (24hrs), Avg:101.7 F (38.7 C), Min:98 F (36.7 C), Max:102.7 F (39.3 C)  Propofol: none   Pt is familiar to this RD from most recent previous admission. PO intake over the past couple of days prior to intubation has been poor. During time of visit, pt has been tolerating her tube feeds fine per RN. Pt is receiving Vital High Protein at 40 ml/hr which provides 960 kcal (69% of minimum kcal needs), 84 grams of protein (93% of minimum protein needs), and 806 ml of free water. RD to modify orders to better fit nutrition needs.  Nutrition-Focused physical exam completed. Findings are severe fat depletion, moderate to severe muscle depletion, and no edema.   Labs and medications  reviewed.   Diet Order:    NPO  Skin:  Wound (see comment) (Stage II pressure ulcer on sacrum, DTI on L heel)  Last BM:  Unknown  Height:   Ht Readings from Last 1 Encounters:  11/19/15 5' 8.11" (1.73 m)    Weight:   Wt Readings from Last 1 Encounters:  11/20/15 114 lb 13.8 oz (52.1 kg)    Ideal Body Weight:  63.8 kg  BMI:  Body mass index is 17.41 kg/(m^2).  Estimated Nutritional Needs:   Kcal:  1392  Protein:  85-100 grams  Fluid:  >/= 1.5 L/day  EDUCATION NEEDS:   No education needs identified at this time  Roslyn Smiling, MS, RD, LDN Pager # [phone] After hours/ weekend pager # [phone]

## 2015-11-20 NOTE — Progress Notes (Signed)
NEURO HOSPITALIST PROGRESS NOTE   SUBJECTIVE:                                                                                                                        Neurologically unchanged. c-EEG reveals frequent electrographic seizures in the right posterior region.  OBJECTIVE:                                                                                                                           Vital signs in last 24 hours: Temp:  [98 F (36.7 C)-100.5 F (38.1 C)] 99 F (37.2 C) (01/18 1536) Pulse Rate:  [55-76] 67 (01/18 1800) Resp:  [11-32] 11 (01/18 1800) BP: (102-184)/(44-70) 102/44 mmHg (01/18 1800) SpO2:  [98 %-100 %] 99 % (01/18 1800) FiO2 (%):  [30 %] 30 % (01/18 1522) Weight:  [52.1 kg (114 lb 13.8 oz)] 52.1 kg (114 lb 13.8 oz) (01/18 0500)  Intake/Output from previous day: 01/17 0701 - 01/18 0700 In: 2666.7 [I.V.:2251.7; NG/GT:315; IV Piggyback:100] Out: 1020 [Urine:1020] Intake/Output this shift: Total I/O In: 1510 [I.V.:670; NG/GT:640; IV Piggyback:200] Out: 280 [Urine:280] Nutritional status:    Past Medical History  Diagnosis Date  . Hypertension   . Diastolic dysfunction, left ventricle by ECHO 2011 03/25/2012  . Hypothyroidism 03/25/2012  . High cholesterol   . Type II diabetes mellitus (HCC)   . H/O hiatal hernia   . Duodenal perforation (HCC) 03/23/12  . Arthritis     "in my back"  . GERD (gastroesophageal reflux disease)   . Dementia    Physical Examination:  HEENT- Normocephalic, no lesions, without obvious abnormality. Normal external eye and conjunctiva. Normal TM's bilaterally. Normal auditory canals and external ears. Normal external nose, mucus membranes and septum. Normal pharynx. Neck supple with no masses, nodes, nodules or enlargement. Cardiovascular - regular rate and rhythm, S1, S2 normal, no murmur, click, rub or gallop Lungs - scattered rhonchi, otherwise unremarkable Abdomen - soft,  non-tender; bowel sounds normal; no masses, no organomegaly Extremities - no joint deformities, effusion, or inflammation  Neurologic Exam:  Mental status: unresponsive, intubated on the vent CN: pupils 1-2 mm poorly reactive, eccentric in location. No gaze preference. Face symmetric. Tongue: intubated. Motor: minimal motor movements on painful stimuli  Deep tendon reflexes were 1+ and symmetrical. Plantar response on the left was extensor, and on the right mute.  Lab Results: Lab Results  Component Value Date/Time   CHOL 162 12/19/2014 03:41 PM   Lipid Panel  Recent Labs  11/19/15 1052  TRIG 92    Studies/Results: Ct Head Wo Contrast  11/19/2015  CLINICAL DATA:  Found unresponsive at nursing home EXAM: CT HEAD WITHOUT CONTRAST TECHNIQUE: Contiguous axial images were obtained from the base of the skull through the vertex without intravenous contrast. COMPARISON:  11/15/2015 FINDINGS: No skull fracture is noted. Paranasal sinuses and mastoid air cells are unremarkable. No intracranial hemorrhage, mass effect or midline shift. Stable cerebral atrophy. Atherosclerotic calcifications of carotid siphon again noted. Again noted chronic extensive white matter disease probable due to small vessel ischemic changes. There is no definite evidence of acute infarction. No mass lesion is noted on this unenhanced scan. Hyperostosis frontalis interna again noted. IMPRESSION: 1. No acute intracranial abnormality. Stable atrophy and extensive chronic white matter disease. No definite acute cortical infarction. No mass lesion is noted on this unenhanced scan. Electronically Signed   By: Natasha Mead M.D.   On: 11/19/2015 10:12   Dg Chest Port 1 View  11/20/2015  CLINICAL DATA:  Respiratory failure. EXAM: PORTABLE CHEST 1 VIEW COMPARISON:  11/19/2015. FINDINGS: Endotracheal tube, NG tube, right IJ line in stable position. Heart size stable. Left base atelectasis and/or infiltrate. Small left pleural effusion.  No pneumothorax . IMPRESSION: 1. Lines and tubes in stable position. 2. Left base atelectasis and/or infiltrate. Small left pleural effusion. Electronically Signed   By: Maisie Fus  Register   On: 11/20/2015 07:40   Dg Chest Port 1 View  11/19/2015  CLINICAL DATA:  Encounter for central line placement. EXAM: PORTABLE CHEST 1 VIEW COMPARISON:  11/19/2015 at 9:43 a.m. FINDINGS: New right internal jugular central venous line tip projects in the lower superior vena cava. No pneumothorax. A nasogastric tube has also been placed since the prior study passing below the diaphragm to curl within the stomach. Endotracheal tube is stable with its tip approximately 5.2 cm above the Carina. Lungs are hyperexpanded with mild basilar atelectasis most evident on the left, unchanged. IMPRESSION: 1. New right internal jugular central venous line catheter tip projects in the lower superior vena cava. No pneumothorax. 2. Nasogastric tube passes below the diaphragm well within the stomach. 3. No other change from the earlier exam Electronically Signed   By: Amie Portland M.D.   On: 11/19/2015 12:23   Dg Chest Portable 1 View  11/19/2015  CLINICAL DATA:  Endotracheal tube placement EXAM: PORTABLE CHEST 1 VIEW COMPARISON:  11/15/2015 FINDINGS: New endotracheal tube with tip just below the clavicular heads. Cardiomegaly. Pulmonary artery enlargement suggesting hypertension. Mild left infrahilar streaky opacity, compatible with atelectasis. No edema, effusion, or pneumothorax. IMPRESSION: 1. New endotracheal tube in unremarkable position. 2. Mild left basilar atelectasis. Electronically Signed   By: Marnee Spring M.D.   On: 11/19/2015 10:00    MEDICATIONS  I have reviewed the patient's current medications.  ASSESSMENT/PLAN:                                                                                                             82 year old lady discharged yesterday following in patient management of Escherichia coli UTI, readmitted with AMS and evidence of focal status epilepticus involving right posterior hemisphere. Plan: IV keppra 1 gram now. Propofol, goal burst suppression. MRI pending. c-EEG monitoring. Will follow up.   Wyatt Portela, MD [facility] [phone]  11/20/2015, 7:00 PM

## 2015-11-20 NOTE — Progress Notes (Signed)
vLTM EEG running. EEG maint complete. No skin breakdown °

## 2015-11-20 NOTE — Progress Notes (Signed)
  Echocardiogram 2D Echocardiogram has been performed.  Delcie Roch 11/20/2015, 10:46 AM

## 2015-11-20 NOTE — Progress Notes (Signed)
Per Dr. Roseanne Reno, Neuro, propofol to 33mcg/min and defer MRI until tomorrow d/t cEEG

## 2015-11-20 NOTE — Consult Note (Signed)
WOC wound consult note Reason for Consult: Consult requested for sacrum and let heel.  Wound type: Sacrum with stage 2 pressure injury; .8X.8X.1cm, pink and moist with peeling skin to wound edges, no odor, small amt yellow drainage. Left heel with deep tissue injury; 6X5cm dark purple blood-filled blister, no odor or drainage at this time.  Deep tissue injuries are high risk to evolve into full thickness tissue loss despite optimal plan of care. Pressure Ulcer POA: Yes Dressing procedure/placement/frequency: Prevalon boot to reduce pressure to left heel.  Foam dressing to sacrum  and left heel to protect and promote healing.  Pt has multiple systemic factors which can impair healing. No family members at the bedside to discuss plan of care. Please re-consult if further assistance is needed.  Thank-you,  Cammie Mcgee MSN, RN, CWOCN, [place], CNS [phone]

## 2015-11-20 NOTE — Progress Notes (Signed)
SBT attempted for a couple of minutes. Patient with apnea. Placed back on full support.

## 2015-11-20 NOTE — Clinical Social Work Note (Signed)
Clinical Social Work Assessment  Patient Details  Name: Megan [name] MRN: [id number] Date of Birth: 25-Jun-1927  Date of referral:  11/20/15               Reason for consult:  Discharge Planning, Facility Placement                Permission sought to share information with:  Facility Medical sales representative, Family Supports Permission granted to share information::  No (Patient is currently unresponsive and intubated)  Name::     Megan [name]  Agency::  [facility]  Relationship::  Son  Contact Information:  [phone]  Housing/Transportation Living arrangements for the past 2 months:  Single Family Home, Skilled Nursing Facility (Patient was transferred to SNF one day ago from previous hospitalization. Prior to that, she was living at home) Source of Information:  Adult Children (Patient's son Megan [name]) Patient Interpreter Needed:  None Criminal Activity/Legal Involvement Pertinent to Current Situation/Hospitalization:  No - Comment as needed Significant Relationships:  Adult Children, Other Family Members Lives with:  Facility Resident Do you feel safe going back to the place where you live?  Yes Need for family participation in patient care:  Yes (Comment)  Care giving concerns:  Patient's son denies any concerns at this time. He reports that he and his family would like for Patient to return to [facility] when medically cleared for discharge.    Social Worker assessment / plan: Patient is a 82 YO African American female who was admitted to the hospital after being found unresponsive at her skilled nursing facility. Patient was discharged from [facility] on 11/18/2015. CSW engaged with Patient's son via T/C as patient is currently intubated and unresponsive. CSW introduced self, role of CSW, and began discussion of discharge planning.Patient's son reports that he and his family are familiar with SNF placement from previous admission. Patient's son reports that he  would like Patient to return to [facility] upon discharge. Patient's son agreeable to plans at this time.   Employment status:  Retired Database administrator PT Recommendations:  Not assessed at this time Information / Referral to community resources:  Skilled Nursing Facility  Patient/Family's Response to care:  Patient's son expresses no concerns regarding Patient's medical care.   Patient/Family's Understanding of and Emotional Response to Diagnosis, Current Treatment, and Prognosis:  Patient's son able to verbalize strong understanding of Patient's current diagnosis, treatment, and prognosis. He is appreciative of CSW's call.   Emotional Assessment Appearance:  Appears stated age Attitude/Demeanor/Rapport:  Unable to Assess Affect (typically observed):  Unable to Assess Orientation:   (Unable to Assess at this time) Alcohol / Substance use:  Not Applicable Psych involvement (Current and /or in the community):  No (Comment)  Discharge Needs  Concerns to be addressed:  Discharge Planning Concerns Readmission within the last 30 days:  Yes Current discharge risk:  Physical Impairment Barriers to Discharge:  Continued Medical Work up   [employer], LCSW 11/20/2015, 1:11 PM

## 2015-11-20 NOTE — Progress Notes (Signed)
PULMONARY / CRITICAL CARE MEDICINE   Name: Megan [name] MRN: [id number] DOB: 12-14-1926    SUBJECTIVE:  Intubated Obtunded  No sedation given per nursing staff  BP stable  VITAL SIGNS: Temp:  [98 F (36.7 C)-102.7 F (39.3 C)] 99 F (37.2 C) (01/18 0329) Pulse Rate:  [48-164] 59 (01/18 0600) Resp:  [11-42] 26 (01/18 0600) BP: (80-241)/(50-158) 113/51 mmHg (01/18 0600) SpO2:  [93 %-100 %] 100 % (01/18 0600) FiO2 (%):  [30 %-100 %] 30 % (01/18 0400) Weight:  [49.1 kg (108 lb 3.9 oz)-52.1 kg (114 lb 13.8 oz)] 52.1 kg (114 lb 13.8 oz) (01/18 0500) HEMODYNAMICS:   VENTILATOR SETTINGS: Vent Mode:  [-] PRVC FiO2 (%):  [30 %-100 %] 30 % Set Rate:  [11 bmp-16 bmp] 11 bmp Vt Set:  [480 mL] 480 mL PEEP:  [5 cmH20] 5 cmH20 Plateau Pressure:  [16 cmH20-19 cmH20] 16 cmH20 INTAKE / OUTPUT: Intake/Output      01/17 0701 - 01/18 0700 01/18 0701 - 01/19 0700   I.V. (mL/kg) 2251.7 (43.2)    NG/GT 315    IV Piggyback 100    Total Intake(mL/kg) 2666.7 (51.2)    Urine (mL/kg/hr) 1020    Total Output 1020     Net +1646.7            PHYSICAL EXAMINATION: General: Obtunded critically ill female currently on ventilator support  Neuro: Unresponsive  HEENT: ETT in place.  Cardiovascular: no edema, regular rate and rhythm  Lungs: diffuse scattered rhonchi. Vent supported breaths.  Abdomen: soft, +BS, ND  Musculoskeletal: No visible joint deformities  Skin: Dry   LABS:  CBC  Recent Labs Lab 11/17/15 0416 11/19/15 0930 11/20/15 0645  WBC 6.4 9.2 10.6*  HGB 12.2 15.6* 11.9*  HCT 37.7 47.5* 36.6  PLT 130* 153 141*   Coag's  Recent Labs Lab 11/15/15 2137  APTT 31  INR 1.14   BMET  Recent Labs Lab 11/17/15 0416 11/17/15 1819 11/19/15 0930  NA 145 144 142  K 2.1* 3.0* 3.3*  CL 107 104 102  CO2 33* 30 29  BUN 7 7 <5*  CREATININE 0.49 0.54 0.67  GLUCOSE 118* 201* 172*   Electrolytes  Recent Labs Lab 11/17/15 0416 11/17/15 1125 11/17/15 1819  11/19/15 0930 11/19/15 1230  CALCIUM 8.5*  --  8.7* 9.5  --   MG  --  1.7  --   --  1.4*  PHOS  --   --   --   --  2.7   Sepsis Markers  Recent Labs Lab 11/19/15 1001 11/19/15 1252  LATICACIDVEN 3.53* 2.78*   ABG  Recent Labs Lab 11/19/15 1105 11/19/15 1344 11/20/15 0635  PHART 7.509* 7.485* 7.487*  PCO2ART 41.4 37.4 32.9*  PO2ART 504.0* 118.0* 116*   Liver Enzymes  Recent Labs Lab 11/15/15 2137 11/19/15 0930  AST 21 17  ALT 10* 8*  ALKPHOS 65 67  BILITOT 1.9* 0.8  ALBUMIN 2.8* 2.7*   Cardiac Enzymes No results for input(s): TROPONINI, PROBNP in the last 168 hours. Glucose  Recent Labs Lab 11/17/15 2156 11/18/15 0729 11/18/15 1134 11/19/15 2010 11/19/15 2347 11/20/15 0331  GLUCAP 174* 156* 147* 123* 162* 198*    Imaging Ct Head Wo Contrast  11/19/2015  CLINICAL DATA:  Found unresponsive at nursing home EXAM: CT HEAD WITHOUT CONTRAST TECHNIQUE: Contiguous axial images were obtained from the base of the skull through the vertex without intravenous contrast. COMPARISON:  11/15/2015 FINDINGS: No skull fracture is noted. Paranasal  sinuses and mastoid air cells are unremarkable. No intracranial hemorrhage, mass effect or midline shift. Stable cerebral atrophy. Atherosclerotic calcifications of carotid siphon again noted. Again noted chronic extensive white matter disease probable due to small vessel ischemic changes. There is no definite evidence of acute infarction. No mass lesion is noted on this unenhanced scan. Hyperostosis frontalis interna again noted. IMPRESSION: 1. No acute intracranial abnormality. Stable atrophy and extensive chronic white matter disease. No definite acute cortical infarction. No mass lesion is noted on this unenhanced scan. Electronically Signed   By: Natasha Mead M.D.   On: 11/19/2015 10:12   Dg Chest Port 1 View  11/19/2015  CLINICAL DATA:  Encounter for central line placement. EXAM: PORTABLE CHEST 1 VIEW COMPARISON:  11/19/2015 at 9:43  a.m. FINDINGS: New right internal jugular central venous line tip projects in the lower superior vena cava. No pneumothorax. A nasogastric tube has also been placed since the prior study passing below the diaphragm to curl within the stomach. Endotracheal tube is stable with its tip approximately 5.2 cm above the Carina. Lungs are hyperexpanded with mild basilar atelectasis most evident on the left, unchanged. IMPRESSION: 1. New right internal jugular central venous line catheter tip projects in the lower superior vena cava. No pneumothorax. 2. Nasogastric tube passes below the diaphragm well within the stomach. 3. No other change from the earlier exam Electronically Signed   By: Amie Portland M.D.   On: 11/19/2015 12:23   Dg Chest Portable 1 View  11/19/2015  CLINICAL DATA:  Endotracheal tube placement EXAM: PORTABLE CHEST 1 VIEW COMPARISON:  11/15/2015 FINDINGS: New endotracheal tube with tip just below the clavicular heads. Cardiomegaly. Pulmonary artery enlargement suggesting hypertension. Mild left infrahilar streaky opacity, compatible with atelectasis. No edema, effusion, or pneumothorax. IMPRESSION: 1. New endotracheal tube in unremarkable position. 2. Mild left basilar atelectasis. Electronically Signed   By: Marnee Spring M.D.   On: 11/19/2015 10:00   STUDIES:  CT head 1/17: no acute finding. Extensive white matter disease  EEG 1/17>>> focal status epilepticus  Ammonia 1/17>>> 25  CULTURES: BCX2 1/17>>> pending UC x2 1/16>>>pending   ANTIBIOTICS: Zosyn 1/17>>> vanc 1/17>>  SIGNIFICANT EVENTS:   LINES/TUBES: Right ij CVL 1/17>>> OETT 1/17>>>  CXR 1/18: L base atelectasis and/ or infiltrate. Small L pleural effusion.   ASSESSMENT / PLAN: PULMONARY A: Ventilator Dependency R/T inability to protect airway CXR 11/19/2015 - Mild atelectasis P:  -Full Vent support  -PAD protocol  -Follow up ABG  -AM Chest Xray  CARDIOVASCULAR A:  Hypertensive Crisis, Initial  Cardiac Enzymes Negative - BP stable now  Tachycardia (resolved) P:  -Labetalol Infusion Titrate for Systolic Goal 145-165 (25 % reduction)  -2D Echo pending   RENAL A:  Hypokalemia  Contraction Alkalosis  P:  -40 meq oral potassium  -IV hydration -Chem in AM pending   GASTROINTESTINAL A:  Severe Protein Calorie Malnutrition  H/O GERD P:  -Consult Nutrition for tube feeds -Protonix 40 mg IV  INFECTIOUS A:  Febrile (Rectal Temp 102.2) P:  -Blood Cultures x2 11/19/15 pending results -Zoysn 11/19/15 -Vancomycin 11/19/15  ENDOCRINE A:  Hyperglycemia/ DM type 2  H/O Hypothyroidism  P:  -CBG q 4 hours -Continue home dose synthroid   NEUROLOGIC A:  Acute Encephalopathy - Unresponsive etiology unclear rule out seizure verus HTN crisis or PRES or Infection vs polypharmacy, CT Head 11/19/15 White Matter Changes. EEG showing focal status epilepticus. CK 34 and Ammonia 25.  P:   -Continue to monitor  -BP  control as above   RASS goal: -2  FAMILY  - Updates:  1/18 - Spoke to patient's son Karma Lew) over the phone to update him about her status.   Inter-disciplinary family meet or Palliative Care meeting due by: 11/26/2015  TODAY'S SUMMARY:   I have personally obtained a history, examined the patient, evaluated laboratory and imaging results, formulated the assessment and plan and placed orders. CRITICAL CARE: The patient is critically ill with multiple organ systems failure and requires high complexity decision making for assessment and support, frequent evaluation and titration of therapies, application of advanced monitoring technologies and extensive interpretation of multiple databases. Critical Care Time devoted to patient care services described in this note is  minutes.    Pulmonary and Critical Care Medicine [facility] Pager: [phone]  11/20/2015, 7:07 AM

## 2015-11-20 NOTE — Procedures (Addendum)
Electroencephalogram report- LTM  Ordering Physician : Dr. Cyril Mourning EEG number: [phone]    Beginning date and time: 11/21/2015 0730AM Ending date and time:  11/22/2015 0730 AM  Day of study: 3  Medications include: per EMR   REPORT: The background activity consists of burst suppression pattern during most of the recording. During later portion of the recording delta activity was seen breaking through in the left hemisphere, while the right hemisphere had overriding frequencies in the anterior leads during the bursts. No electrographic seizures were seen.  There were no pushbutton activations events during this recording.   INTERPRETATION: This is an abnormal EEG due to: 1) Burst suppression pattern   Clinical Correlation: This EEG showed diffuse slowing with burst suppression pattern. During the burst the right anterior leads had more overriding faster frequencies. No electrographic seizures were seen.     ----------------------------------------------------------------------------------------------------------- Electroencephalogram report- LTM  Ordering Physician : Dr. Cyril Mourning EEG number: [phone]    Beginning date and time: 11/20/2015 1:12PM Ending date and time:  11/21/2015 0730 AM  Day of study: 2  Medications include: per EMR  MENTAL STATUS (per technician's notes): Intubated. Sedated.  REPORT: The background activity at the beginning of the recording consists of disorganized 7-9Hz  activity with intermixed delta and theta on the left hemisphere. There is suppression, more prominent delta and theta activity seen in the right hemisphere. Intermittently, periodic 0.5-1Hz  epileptiform discharges are seen in the right posterior region, phase reversing at P4 in the longitudinal bipolar montage. Frequent electrographic seizures were seen in the right posterior quadrant, in the early part of the recording, averaging every 10-15 minutes. The seizures decrease in frequency as the  medications are uptitrated, and there is increase theta and delta background slowing. Around 7:30PM, burst suppression was achieved with about 3-5 seconds of suppression in between bursts. No further electrographic seizures were seen.  There were no pushbutton activations events during this recording.   INTERPRETATION: This is an abnormal EEG due to: 1) Electrographic seizures seen in the right posterior region 2) Focal slowing in the right hemisphere 3) Diffuse Slowing  Clinical Correlation: This EEG is consistent with focal neuronal dysfunction and electrographic seizures in the right posterior region. There was burst suppression pattern, and improvement in the seizures starting late evening, likely secondary to up-titration of medications   --------------------------------------------------------------------------------------------------------------------------------  Electroencephalogram report- LTM  Ordering Physician : Dr. Cyril Mourning EEG number: [phone]    Beginning date and time: 11/18/2014 10PM Ending date and time:  11/20/2015 1:12PM  Day of study: 1  Medications include: per EMR  MENTAL STATUS (per technician's notes): Intubated. Sedated.  HISTORY: This 24 hours of intensive EEG monitoring with simultaneous video monitoring was performed for this patient with unresponsiveness and witnessed convulsive seizures.  Patient is now intubated and sedated.  This EEG was requested to rule out subclinical electrographic seizures.  TECHNICAL DESCRIPTION:  The study consists of a continuous 16-channel multi-montage digital video EEG recording with twenty-one electrodes placed according to the International 10-20 System. Additional leads included eye leads, true temporal leads (T1, T2), and an EKG lead. Activation procedures were not done due to mental status.  REPORT: The background activity consists of disorganized 7-9Hz  activity with intermixed delta and theta on the left hemisphere. There  is suppression, more prominent delta and theta activity seen in the right hemisphere. Intermittently, epileptiform discharges are seen in the right posterior region, phase reversing at P4 in the longitudinal bipolar montage. Starting early morning of 11/19/2015, frequent electrographic seizures were seen again  in the right posterior quadrant.   There were no pushbutton activations events during this recording.   INTERPRETATION: This is an abnormal EEG due to: 1) Electrographic seizures seen in the right posterior region 2) Focal slowing in the right hemisphere 3) Diffuse Slowing  Clinical Correlation: This EEG is consistent with focal neuronal dysfunction and electrographic seizures in the right posterior region. There is also superimposed diffuse encephalopathy.

## 2015-11-20 NOTE — Progress Notes (Signed)
Inpatient Diabetes Program Recommendations  AACE/ADA: New Consensus Statement on Inpatient Glycemic Control (2015)  Target Ranges:  Prepandial:   less than 140 mg/dL      Peak postprandial:   less than 180 mg/dL (1-2 hours)      Critically ill patients:  140 - 180 mg/dL   Review of Glycemic Control  Outpatient Diabetes medications: None Current orders for Inpatient glycemic control: None  Results for JENNALEE, GREAVES (MRN [id number]) as of 11/20/2015 09:31  Ref. Range 11/18/2015 11:34 11/19/2015 20:10 11/19/2015 23:47 11/20/2015 03:31 11/20/2015 07:49  Glucose-Capillary Latest Ref Range: 65-99 mg/dL 782 (H) 956 (H) 213 (H) 198 (H) 182 (H)   Results for SIARRA, GILKERSON (MRN [id number]) as of 11/20/2015 09:31  Ref. Range 11/17/2015 18:19 11/19/2015 09:30 11/20/2015 06:45  Glucose Latest Ref Range: 65-99 mg/dL 629 (H) 528 (H) 413 (H)   Inpatient Diabetes Program Recommendations:    Add Novolog sensitive tidwc  Will continue to follow. Thank you. Ailene Ards, RD, LDN, CDE Inpatient Diabetes Coordinator [phone]

## 2015-11-20 NOTE — Progress Notes (Signed)
Wasted 75 ml Propofol wit Hazel Sams RN

## 2015-11-21 ENCOUNTER — Inpatient Hospital Stay (HOSPITAL_COMMUNITY): Payer: Medicare Other

## 2015-11-21 ENCOUNTER — Other Ambulatory Visit: Payer: Self-pay | Admitting: Internal Medicine

## 2015-11-21 DIAGNOSIS — Z789 Other specified health status: Secondary | ICD-10-CM

## 2015-11-21 DIAGNOSIS — J9600 Acute respiratory failure, unspecified whether with hypoxia or hypercapnia: Secondary | ICD-10-CM

## 2015-11-21 DIAGNOSIS — Z978 Presence of other specified devices: Secondary | ICD-10-CM | POA: Diagnosis present

## 2015-11-21 LAB — CBC
HEMATOCRIT: 30 % — AB (ref 36.0–46.0)
HEMATOCRIT: 32.1 % — AB (ref 36.0–46.0)
HEMOGLOBIN: 10 g/dL — AB (ref 12.0–15.0)
Hemoglobin: 9.9 g/dL — ABNORMAL LOW (ref 12.0–15.0)
MCH: 30.2 pg (ref 26.0–34.0)
MCH: 28.9 pg (ref 26.0–34.0)
MCHC: 33 g/dL (ref 30.0–36.0)
MCHC: 31.2 g/dL (ref 30.0–36.0)
MCV: 91.5 fL (ref 78.0–100.0)
MCV: 92.8 fL (ref 78.0–100.0)
Platelets: 127 10*3/uL — ABNORMAL LOW (ref 150–400)
Platelets: 122 10*3/uL — ABNORMAL LOW (ref 150–400)
RBC: 3.28 MIL/uL — ABNORMAL LOW (ref 3.87–5.11)
RBC: 3.46 MIL/uL — AB (ref 3.87–5.11)
RDW: 14.8 % (ref 11.5–15.5)
RDW: 15.2 % (ref 11.5–15.5)
WBC: 10 10*3/uL (ref 4.0–10.5)
WBC: 8.6 10*3/uL (ref 4.0–10.5)

## 2015-11-21 LAB — BLOOD GAS, ARTERIAL
ACID-BASE EXCESS: 2 mmol/L (ref 0.0–2.0)
BICARBONATE: 24.9 meq/L — AB (ref 20.0–24.0)
Drawn by: 44956
FIO2: 0.3
LHR: 11 {breaths}/min
O2 SAT: 99 %
PEEP/CPAP: 5 cmH2O
PH ART: 7.514 — AB (ref 7.350–7.450)
Patient temperature: 97.7
TCO2: 25.9 mmol/L (ref 0–100)
VT: 480 mL
pCO2 arterial: 30.9 mmHg — ABNORMAL LOW (ref 35.0–45.0)
pO2, Arterial: 126 mmHg — ABNORMAL HIGH (ref 80.0–100.0)

## 2015-11-21 LAB — GLUCOSE, CAPILLARY
GLUCOSE-CAPILLARY: 111 mg/dL — AB (ref 65–99)
Glucose-Capillary: 115 mg/dL — ABNORMAL HIGH (ref 65–99)
Glucose-Capillary: 134 mg/dL — ABNORMAL HIGH (ref 65–99)
Glucose-Capillary: 142 mg/dL — ABNORMAL HIGH (ref 65–99)
Glucose-Capillary: 147 mg/dL — ABNORMAL HIGH (ref 65–99)
Glucose-Capillary: 154 mg/dL — ABNORMAL HIGH (ref 65–99)

## 2015-11-21 LAB — MAGNESIUM: Magnesium: 2.1 mg/dL (ref 1.7–2.4)

## 2015-11-21 LAB — BASIC METABOLIC PANEL
Anion gap: 6 (ref 5–15)
BUN: 23 mg/dL — AB (ref 6–20)
CO2: 26 mmol/L (ref 22–32)
CREATININE: 0.79 mg/dL (ref 0.44–1.00)
Calcium: 8.4 mg/dL — ABNORMAL LOW (ref 8.9–10.3)
Chloride: 111 mmol/L (ref 101–111)
GFR calc non Af Amer: >60 mL/min (ref >60)
Glucose, Bld: 162 mg/dL — ABNORMAL HIGH (ref 65–99)
Potassium: 2.8 mmol/L — ABNORMAL LOW (ref 3.5–5.1)
Sodium: 143 mmol/L (ref 135–145)

## 2015-11-21 LAB — PHOSPHORUS: PHOSPHORUS: 2.1 mg/dL — AB (ref 2.5–4.6)

## 2015-11-21 LAB — PROCALCITONIN: Procalcitonin: 0.25 ng/mL

## 2015-11-21 MED ORDER — K PHOS MONO-SOD PHOS DI & MONO 155-852-130 MG PO TABS
250.0000 mg | ORAL_TABLET | Freq: Once | ORAL | Status: AC
  Administered 2015-11-21: 250 mg via ORAL
  Filled 2015-11-21: qty 1

## 2015-11-21 MED ORDER — POTASSIUM CHLORIDE 20 MEQ/15ML (10%) PO SOLN
40.0000 meq | Freq: Once | ORAL | Status: AC
  Administered 2015-11-21: 40 meq
  Filled 2015-11-21: qty 30

## 2015-11-21 NOTE — Progress Notes (Signed)
Updated patient's son Karma Lew about her current medical status.

## 2015-11-21 NOTE — Progress Notes (Signed)
eLink Physician-Brief Progress Note Patient Name: Megan [name] DOB: 04/16/1927 MRN: [id number]   Date of Service  11/21/2015  HPI/Events of Note  K 2.8  eICU Interventions  KCl 40 mEq per tube. Will likely need more. Rounding team to address     Intervention Category Intermediate Interventions: Electrolyte abnormality - evaluation and management  Billy Fischer 11/21/2015, 5:17 AM

## 2015-11-21 NOTE — Progress Notes (Signed)
NEURO HOSPITALIST PROGRESS NOTE   SUBJECTIVE:                                                                                                                        Remains intubated on the vent. On propofol. c-EEG monitoring shows burst suppression, no evidence of electrographic seizures within the past couple of hours. On keppra 1 gram iv BID. Available labs reviewed: K 2.8, Phosphorus 2.1, hemoglobin 9.9, platelets 127 OBJECTIVE:                                                                                                                           Vital signs in last 24 hours: Temp:  [97.2 F (36.2 C)-99 F (37.2 C)] 97.2 F (36.2 C) (01/19 0756) Pulse Rate:  [39-76] 49 (01/19 0700) Resp:  [11-32] 16 (01/19 0700) BP: (87-184)/(42-69) 87/46 mmHg (01/19 0700) SpO2:  [98 %-100 %] 100 % (01/19 0700) FiO2 (%):  [30 %] 30 % (01/19 0814) Weight:  [56.4 kg (124 lb 5.4 oz)] 56.4 kg (124 lb 5.4 oz) (01/19 0356)  Intake/Output from previous day: 01/18 0701 - 01/19 0700 In: 3055.1 [I.V.:1905.1; NG/GT:740; IV Piggyback:410] Out: 340 [Urine:340] Intake/Output this shift:   Nutritional status:    Past Medical History  Diagnosis Date  . Hypertension   . Diastolic dysfunction, left ventricle by ECHO 2011 03/25/2012  . Hypothyroidism 03/25/2012  . High cholesterol   . Type II diabetes mellitus (HCC)   . H/O hiatal hernia   . Duodenal perforation (HCC) 03/23/12  . Arthritis     "in my back"  . GERD (gastroesophageal reflux disease)   . Dementia    Physical Examination:  HEENT- Normocephalic, no lesions, without obvious abnormality. Normal external eye and conjunctiva. Normal TM's bilaterally. Normal auditory canals and external ears. Normal external nose, mucus membranes and septum. Normal pharynx. Neck supple with no masses, nodes, nodules or enlargement. Cardiovascular - regular rate and rhythm, S1, S2 normal, no murmur, click, rub or  gallop Lungs - scattered rhonchi, otherwise unremarkable Abdomen - soft, non-tender; bowel sounds normal; no masses, no organomegaly Extremities - no joint deformities, effusion, or inflammation, cast left LE  Neurologic Exam:  Mental status: unresponsive, intubated on the vent on propofol  CN: pupils 1-2 mm poorly reactive, eccentric in location. No gaze preference. Face symmetric. Tongue: intubated. Motor: minimal motor movements on painful stimuli Deep tendon reflexes were 1+ and symmetrical. Plantar response on the left was extensor, and on the right mu  Lab Results: Lab Results  Component Value Date/Time   CHOL 162 12/19/2014 03:41 PM   Lipid Panel  Recent Labs  11/20/15 1912  TRIG 65    Studies/Results: Ct Head Wo Contrast  11/19/2015  CLINICAL DATA:  Found unresponsive at nursing home EXAM: CT HEAD WITHOUT CONTRAST TECHNIQUE: Contiguous axial images were obtained from the base of the skull through the vertex without intravenous contrast. COMPARISON:  11/15/2015 FINDINGS: No skull fracture is noted. Paranasal sinuses and mastoid air cells are unremarkable. No intracranial hemorrhage, mass effect or midline shift. Stable cerebral atrophy. Atherosclerotic calcifications of carotid siphon again noted. Again noted chronic extensive white matter disease probable due to small vessel ischemic changes. There is no definite evidence of acute infarction. No mass lesion is noted on this unenhanced scan. Hyperostosis frontalis interna again noted. IMPRESSION: 1. No acute intracranial abnormality. Stable atrophy and extensive chronic white matter disease. No definite acute cortical infarction. No mass lesion is noted on this unenhanced scan. Electronically Signed   By: Natasha Mead M.D.   On: 11/19/2015 10:12   Dg Chest Port 1 View  11/21/2015  CLINICAL DATA:  Intubation. EXAM: PORTABLE CHEST 1 VIEW COMPARISON:  11/20/2015. FINDINGS: Endotracheal tube, NG tube, right IJ line in stable position.  Heart size stable. Persistent left base atelectasis and/or infiltrate. Small left pleural effusion. No pneumothorax. IMPRESSION: 1. Lines and tubes stable position. 2. Persistent left base atelectasis and or infiltrate. Persistent small left pleural effusion. Electronically Signed   By: Maisie Fus  Register   On: 11/21/2015 07:21   Dg Chest Port 1 View  11/20/2015  CLINICAL DATA:  Respiratory failure. EXAM: PORTABLE CHEST 1 VIEW COMPARISON:  11/19/2015. FINDINGS: Endotracheal tube, NG tube, right IJ line in stable position. Heart size stable. Left base atelectasis and/or infiltrate. Small left pleural effusion. No pneumothorax . IMPRESSION: 1. Lines and tubes in stable position. 2. Left base atelectasis and/or infiltrate. Small left pleural effusion. Electronically Signed   By: Maisie Fus  Register   On: 11/20/2015 07:40   Dg Chest Port 1 View  11/19/2015  CLINICAL DATA:  Encounter for central line placement. EXAM: PORTABLE CHEST 1 VIEW COMPARISON:  11/19/2015 at 9:43 a.m. FINDINGS: New right internal jugular central venous line tip projects in the lower superior vena cava. No pneumothorax. A nasogastric tube has also been placed since the prior study passing below the diaphragm to curl within the stomach. Endotracheal tube is stable with its tip approximately 5.2 cm above the Carina. Lungs are hyperexpanded with mild basilar atelectasis most evident on the left, unchanged. IMPRESSION: 1. New right internal jugular central venous line catheter tip projects in the lower superior vena cava. No pneumothorax. 2. Nasogastric tube passes below the diaphragm well within the stomach. 3. No other change from the earlier exam Electronically Signed   By: Amie Portland M.D.   On: 11/19/2015 12:23   Dg Chest Portable 1 View  11/19/2015  CLINICAL DATA:  Endotracheal tube placement EXAM: PORTABLE CHEST 1 VIEW COMPARISON:  11/15/2015 FINDINGS: New endotracheal tube with tip just below the clavicular heads. Cardiomegaly. Pulmonary  artery enlargement suggesting hypertension. Mild left infrahilar streaky opacity, compatible with atelectasis. No edema, effusion, or pneumothorax. IMPRESSION: 1. New endotracheal tube in unremarkable position. 2. Mild left  basilar atelectasis. Electronically Signed   By: Marnee Spring M.D.   On: 11/19/2015 10:00    MEDICATIONS                                                                                                                        Scheduled: . antiseptic oral rinse  7 mL Mouth Rinse QID  . chlorhexidine gluconate  15 mL Mouth Rinse BID  . insulin aspart  0-9 Units Subcutaneous 6 times per day  . latanoprost  1 drop Both Eyes QHS  . levETIRAcetam  1,000 mg Intravenous Q12H  . levothyroxine  50 mcg Per Tube QAC breakfast  . pantoprazole sodium  40 mg Per Tube QHS  . piperacillin-tazobactam (ZOSYN)  IV  3.375 g Intravenous Q8H  . vancomycin  750 mg Intravenous Q24H    ASSESSMENT/PLAN:                                                                                                            82 year old lady readmitted with AMS and evidence of focal status epilepticus involving right posterior hemisphere. c-EEG at this moment is much improved and shows no electrographic seizures. MRI pending. Plan: will attempt weaning off propofol after >24 hours without electrographic seizures. Cont iv keppra. c-EEG monitoring. Will follow up.  Wyatt Portela, MD [facility] [phone]  11/21/2015, 8:57 AM

## 2015-11-21 NOTE — Progress Notes (Signed)
PULMONARY / CRITICAL CARE MEDICINE   Name: Megan [name] MRN: [id number] DOB: 1927-09-21    VITAL SIGNS: Temp:  [97.7 F (36.5 C)-99 F (37.2 C)] 97.7 F (36.5 C) (01/19 0340) Pulse Rate:  [39-76] 59 (01/19 0600) Resp:  [11-32] 14 (01/19 0600) BP: (89-184)/(42-69) 90/48 mmHg (01/19 0600) SpO2:  [98 %-100 %] 100 % (01/19 0600) FiO2 (%):  [30 %] 30 % (01/19 0315) Weight:  [56.4 kg (124 lb 5.4 oz)] 56.4 kg (124 lb 5.4 oz) (01/19 0356) HEMODYNAMICS:   VENTILATOR SETTINGS: Vent Mode:  [-] PRVC FiO2 (%):  [30 %] 30 % Set Rate:  [11 bmp] 11 bmp Vt Set:  [480 mL] 480 mL PEEP:  [5 cmH20] 5 cmH20 Plateau Pressure:  [16 cmH20-18 cmH20] 16 cmH20 INTAKE / OUTPUT: Intake/Output      01/18 0701 - 01/19 0700 01/19 0701 - 01/20 0700   I.V. (mL/kg) 1830.1 (32.4)    NG/GT 740    IV Piggyback 397.5    Total Intake(mL/kg) 2967.6 (52.6)    Urine (mL/kg/hr) 340 (0.3)    Total Output 340     Net +2627.6            PHYSICAL EXAMINATION: General: Obtunded critically ill female currently on ventilator support  Neuro: Unresponsive  HEENT: ETT in place.  Cardiovascular: no edema, regular rate and rhythm  Lungs: Ant lung fields clear. Vent supported breaths.  Abdomen: soft, +BS, ND  Musculoskeletal: No visible joint deformities  Skin: Dry   LABS:  CBC  Recent Labs Lab 11/19/15 0930 11/20/15 0645 11/21/15 0400  WBC 9.2 10.6* 10.0  HGB 15.6* 11.9* 9.9*  HCT 47.5* 36.6 30.0*  PLT 153 141* 127*   Coag's  Recent Labs Lab 11/15/15 2137  APTT 31  INR 1.14   BMET  Recent Labs Lab 11/19/15 0930 11/20/15 0645 11/21/15 0400  NA 142 142 143  K 3.3* 3.2* 2.8*  CL 102 109 111  CO2 BUN <5* 15 23*  CREATININE 0.67 0.84 0.79  GLUCOSE 172* 228* 162*   Electrolytes  Recent Labs Lab 11/19/15 0930 11/19/15 1230 11/20/15 0645 11/21/15 0400  CALCIUM 9.5  --  8.5* 8.4*  MG  --  1.4* 2.4 2.1  PHOS  --  2.7 2.5 2.1*   Sepsis Markers  Recent Labs Lab  11/19/15 1001 11/19/15 1252 11/20/15 1314 11/20/15 1315 11/21/15 0400  LATICACIDVEN 3.53* 2.78*  --  1.5  --   PROCALCITON  --   --  0.27  --  0.25   ABG  Recent Labs Lab 11/19/15 1344 11/20/15 0635 11/21/15 0354  PHART 7.485* 7.487* 7.514*  PCO2ART 37.4 32.9* 30.9*  PO2ART 118.0* 116* 126*   Liver Enzymes  Recent Labs Lab 11/15/15 2137 11/19/15 0930  AST 21 17  ALT 10* 8*  ALKPHOS 65 67  BILITOT 1.9* 0.8  ALBUMIN 2.8* 2.7*   Cardiac Enzymes No results for input(s): TROPONINI, PROBNP in the last 168 hours. Glucose  Recent Labs Lab 11/20/15 0749 11/20/15 1147 11/20/15 1535 11/20/15 2023 11/20/15 2355 11/21/15 0342  GLUCAP 182* 195* 163* 154* 115* 142*    Imaging Ct Head Wo Contrast  11/19/2015  CLINICAL DATA:  Found unresponsive at nursing home EXAM: CT HEAD WITHOUT CONTRAST TECHNIQUE: Contiguous axial images were obtained from the base of the skull through the vertex without intravenous contrast. COMPARISON:  11/15/2015 FINDINGS: No skull fracture is noted. Paranasal sinuses and mastoid air cells are unremarkable. No intracranial hemorrhage, mass effect  or midline shift. Stable cerebral atrophy. Atherosclerotic calcifications of carotid siphon again noted. Again noted chronic extensive white matter disease probable due to small vessel ischemic changes. There is no definite evidence of acute infarction. No mass lesion is noted on this unenhanced scan. Hyperostosis frontalis interna again noted. IMPRESSION: 1. No acute intracranial abnormality. Stable atrophy and extensive chronic white matter disease. No definite acute cortical infarction. No mass lesion is noted on this unenhanced scan. Electronically Signed   By: Natasha Mead M.D.   On: 11/19/2015 10:12   Dg Chest Port 1 View  11/20/2015  CLINICAL DATA:  Respiratory failure. EXAM: PORTABLE CHEST 1 VIEW COMPARISON:  11/19/2015. FINDINGS: Endotracheal tube, NG tube, right IJ line in stable position. Heart size stable.  Left base atelectasis and/or infiltrate. Small left pleural effusion. No pneumothorax . IMPRESSION: 1. Lines and tubes in stable position. 2. Left base atelectasis and/or infiltrate. Small left pleural effusion. Electronically Signed   By: Maisie Fus  Register   On: 11/20/2015 07:40   Dg Chest Port 1 View  11/19/2015  CLINICAL DATA:  Encounter for central line placement. EXAM: PORTABLE CHEST 1 VIEW COMPARISON:  11/19/2015 at 9:43 a.m. FINDINGS: New right internal jugular central venous line tip projects in the lower superior vena cava. No pneumothorax. A nasogastric tube has also been placed since the prior study passing below the diaphragm to curl within the stomach. Endotracheal tube is stable with its tip approximately 5.2 cm above the Carina. Lungs are hyperexpanded with mild basilar atelectasis most evident on the left, unchanged. IMPRESSION: 1. New right internal jugular central venous line catheter tip projects in the lower superior vena cava. No pneumothorax. 2. Nasogastric tube passes below the diaphragm well within the stomach. 3. No other change from the earlier exam Electronically Signed   By: Amie Portland M.D.   On: 11/19/2015 12:23   Dg Chest Portable 1 View  11/19/2015  CLINICAL DATA:  Endotracheal tube placement EXAM: PORTABLE CHEST 1 VIEW COMPARISON:  11/15/2015 FINDINGS: New endotracheal tube with tip just below the clavicular heads. Cardiomegaly. Pulmonary artery enlargement suggesting hypertension. Mild left infrahilar streaky opacity, compatible with atelectasis. No edema, effusion, or pneumothorax. IMPRESSION: 1. New endotracheal tube in unremarkable position. 2. Mild left basilar atelectasis. Electronically Signed   By: Marnee Spring M.D.   On: 11/19/2015 10:00   STUDIES:  CT head 1/17: no acute finding. Extensive white matter disease  EEG 1/17>>> focal status epilepticus  Ammonia 1/17>>> 25  CULTURES: BCX2 1/17>>> no growth in <24 hrs UC 1/16>>>1000 colonies,  insignificant  ANTIBIOTICS: Zosyn 1/17>>> vanc 1/17>>1/19  SIGNIFICANT EVENTS: 1/17>> intubated, started on labetalol for HTN 1/18>>intubated. Obtunded. No sedation. BP stable.  1/19>> intubated, continues to be obtunded. Bradycardic with HR in 40s-50s, BP soft with systolic in 90s. Neuro started her on Keppra and Propofol yesterday evening for status epilepticus. Continuous EEG monitoring in place, MRI deferred for sometime later today.   LINES/TUBES: Right ij CVL 1/17>>> OETT 1/17>>>  CXR 1/19: Persistent left base atelectasis and or infiltrate. Persistent small left pleural effusion.   ASSESSMENT / PLAN: PULMONARY A: Ventilator Dependency R/T inability to protect airway CXR 11/19/2015 - Mild atelectasis P:  -Full Vent support  -PAD protocol  -started on propofol by neuro  -Follow up ABG  -AM Chest Xray  CARDIOVASCULAR A:  Hypertensive Crisis, Initial Cardiac Enzymes Negative - BP stable now  Tachycardia (resolved) Hypotension now - likely 2/2 propofol infusion  P:  -monitor vitals  -2D Echo pending  RENAL A:  Hypokalemia  Hypophosphatemia  Contraction Alkalosis  P:  -Replete electrolytes as needed  -IV hydration -Chem in AM pending    GASTROINTESTINAL A:  Severe Protein Calorie Malnutrition  H/O GERD P:  -Consult Nutrition for tube feeds -Protonix 40 mg IV  INFECTIOUS A: Possible pneumonia? Procalcitonin 0.25.   P:  -Blood Cultures x2 11/19/15 pending results -Zoysn 11/19/15 -D/ c Vancomycin -Tracheal aspirate culture pending   ENDOCRINE A:  Hyperglycemia/ DM type 2  H/O Hypothyroidism  P:  -CBG q 4 hours -Continue home dose synthroid   HEMATOLOGIC A: Normocytic anemia - could be due to acute critical illness, however, GI blood loss has to be ruled out P: -consider checking FOBT -Monitor CBC  NEUROLOGIC A:  Acute Encephalopathy - Unresponsive etiology unclear rule out seizure verus HTN crisis or PRES  or Infection vs polypharmacy, CT Head 11/19/15 White Matter Changes. EEG showing focal status epilepticus. CK 34 and Ammonia 25.  P:   -Continue to monitor  -BP control as above  -Continuous EEG monitoring -MRI pending -keppra 1 gram iv BID -RASS goal: -2  FAMILY  - Updates:  1/18 - Spoke to patient's son Karma Lew) over the phone to update him about her status.   Inter-disciplinary family meet or Palliative Care meeting due by: 11/26/2015  TODAY'S SUMMARY:   I have personally obtained a history, examined the patient, evaluated laboratory and imaging results, formulated the assessment and plan and placed orders. CRITICAL CARE: The patient is critically ill with multiple organ systems failure and requires high complexity decision making for assessment and support, frequent evaluation and titration of therapies, application of advanced monitoring technologies and extensive interpretation of multiple databases. Critical Care Time devoted to patient care services described in this note is  minutes.    Pulmonary and Critical Care Medicine [facility] Pager: [phone]  11/21/2015, 7:13 AM

## 2015-11-21 NOTE — Progress Notes (Signed)
Co-worker RT attempted to obtain sputum culture x 2 and was unable to d/t insufficient sample.

## 2015-11-22 ENCOUNTER — Inpatient Hospital Stay (HOSPITAL_COMMUNITY): Payer: Medicare Other

## 2015-11-22 LAB — BLOOD GAS, ARTERIAL
Acid-Base Excess: 1.7 mmol/L (ref 0.0–2.0)
BICARBONATE: 25.3 meq/L — AB (ref 20.0–24.0)
Drawn by: 41977
FIO2: 0.3
O2 Saturation: 98.7 %
PEEP: 5 cmH2O
Patient temperature: 98.4
RATE: 11 resp/min
TCO2: 26.4 mmol/L (ref 0–100)
VT: 480 mL
pCO2 arterial: 36.3 mmHg (ref 35.0–45.0)
pH, Arterial: 7.457 — ABNORMAL HIGH (ref 7.350–7.450)
pO2, Arterial: 136 mmHg — ABNORMAL HIGH (ref 80.0–100.0)

## 2015-11-22 LAB — BASIC METABOLIC PANEL
Anion gap: 6 (ref 5–15)
BUN: 24 mg/dL — ABNORMAL HIGH (ref 6–20)
CHLORIDE: 111 mmol/L (ref 101–111)
CO2: 27 mmol/L (ref 22–32)
Calcium: 8.5 mg/dL — ABNORMAL LOW (ref 8.9–10.3)
Creatinine, Ser: 0.61 mg/dL (ref 0.44–1.00)
GFR calc non Af Amer: >60 mL/min (ref >60)
Glucose, Bld: 175 mg/dL — ABNORMAL HIGH (ref 65–99)
POTASSIUM: 3.9 mmol/L (ref 3.5–5.1)
SODIUM: 144 mmol/L (ref 135–145)

## 2015-11-22 LAB — GLUCOSE, CAPILLARY
GLUCOSE-CAPILLARY: 143 mg/dL — AB (ref 65–99)
GLUCOSE-CAPILLARY: 133 mg/dL — AB (ref 65–99)
GLUCOSE-CAPILLARY: 102 mg/dL — AB (ref 65–99)
Glucose-Capillary: 101 mg/dL — ABNORMAL HIGH (ref 65–99)
Glucose-Capillary: 138 mg/dL — ABNORMAL HIGH (ref 65–99)
Glucose-Capillary: 146 mg/dL — ABNORMAL HIGH (ref 65–99)

## 2015-11-22 LAB — CBC
HCT: 31.1 % — ABNORMAL LOW (ref 36.0–46.0)
HEMOGLOBIN: 9.7 g/dL — AB (ref 12.0–15.0)
MCH: 29.1 pg (ref 26.0–34.0)
MCHC: 31.2 g/dL (ref 30.0–36.0)
MCV: 93.4 fL (ref 78.0–100.0)
Platelets: 121 10*3/uL — ABNORMAL LOW (ref 150–400)
RBC: 3.33 MIL/uL — AB (ref 3.87–5.11)
RDW: 15.2 % (ref 11.5–15.5)
WBC: 8.1 10*3/uL (ref 4.0–10.5)

## 2015-11-22 LAB — MAGNESIUM: MAGNESIUM: 1.9 mg/dL (ref 1.7–2.4)

## 2015-11-22 LAB — PROCALCITONIN: Procalcitonin: 0.16 ng/mL

## 2015-11-22 LAB — PHOSPHORUS: PHOSPHORUS: 1.5 mg/dL — AB (ref 2.5–4.6)

## 2015-11-22 MED ORDER — POTASSIUM PHOSPHATES 15 MMOLE/5ML IV SOLN
40.0000 meq | Freq: Once | INTRAVENOUS | Status: AC
  Administered 2015-11-22: 40 meq via INTRAVENOUS
  Filled 2015-11-22: qty 9.09

## 2015-11-22 NOTE — Progress Notes (Signed)
Switched to full vent support d/t pt w/ low min volume and pt going to MRI now.

## 2015-11-22 NOTE — Progress Notes (Signed)
No urine output this shift. Resident MD aware.

## 2015-11-22 NOTE — Progress Notes (Signed)
Brain MRI findings discussed with patient's daughter who was at bedside. Dr. Cyril Mourning also discussed the findings with the patient's daughter.

## 2015-11-22 NOTE — Progress Notes (Signed)
NEURO HOSPITALIST PROGRESS NOTE   SUBJECTIVE:                                                                                                                        Remains sedated with propofol, intubated on the vent. c-EEG without evidence of electrographic seizures at this time. On keppra 1 gram iv BID. MRI brain pending. Reviewed significant labs: phosphorus 1.5, hemoglobin 9.7 OBJECTIVE:                                                                                                                           Vital signs in last 24 hours: Temp:  [97 F (36.1 C)-98.6 F (37 C)] 98.6 F (37 C) (01/20 0808) Pulse Rate:  [46-66] 66 (01/20 0835) Resp:  [11-27] 27 (01/20 0835) BP: (101-156)/(46-63) 118/47 mmHg (01/20 0835) SpO2:  [100 %] 100 % (01/20 0835) FiO2 (%):  [30 %] 30 % (01/20 0835) Weight:  [60.6 kg (133 lb 9.6 oz)] 60.6 kg (133 lb 9.6 oz) (01/20 0451)  Intake/Output from previous day: 01/19 0701 - 01/20 0700 In: 3497.4 [I.V.:1557.4; NG/GT:1570; IV Piggyback:370] Out: 395 [Urine:395] Intake/Output this shift: Total I/O In: 472.6 [I.V.:172.6; NG/GT:190; IV Piggyback:110] Out: 100 [Urine:100] Nutritional status:    Past Medical History  Diagnosis Date  . Hypertension   . Diastolic dysfunction, left ventricle by ECHO 2011 03/25/2012  . Hypothyroidism 03/25/2012  . High cholesterol   . Type II diabetes mellitus (HCC)   . H/O hiatal hernia   . Duodenal perforation (HCC) 03/23/12  . Arthritis     "in my back"  . GERD (gastroesophageal reflux disease)   . Dementia    Physical Examination:  HEENT- Normocephalic, no lesions, without obvious abnormality. Normal external eye and conjunctiva. Normal TM's bilaterally. Normal auditory canals and external ears. Normal external nose, mucus membranes and septum. Normal pharynx. Neck supple with no masses, nodes, nodules or enlargement. Cardiovascular - regular rate and rhythm, S1, S2  normal, no murmur, click, rub or gallop Lungs - scattered rhonchi, otherwise unremarkable Abdomen - soft, non-tender; bowel sounds normal; no masses, no organomegaly Extremities - no joint deformities, effusion, or inflammation   Neurologic Exam:  Mental status: unresponsive but on propofol, intubated on the vent  CN: pupils 1-2 mm poorly reactive, eccentric in location. No gaze preference. Face symmetric. Tongue: intubated. Motor: minimal motor movements on painful stimuli Deep tendon reflexes were 1+ and symmetrical. Plantar response on the left was extensor, and on the right mute.  Lab Results: Lab Results  Component Value Date/Time   CHOL 162 12/19/2014 03:41 PM   Lipid Panel  Recent Labs  11/20/15 1912  TRIG 65    Studies/Results: Dg Chest Port 1 View  11/22/2015  CLINICAL DATA:  Respiratory failure. EXAM: PORTABLE CHEST 1 VIEW COMPARISON:  11/21/2015 . FINDINGS: Endotracheal tube, NG tube, right IJ line in stable position. Mediastinum hilar structures are normal. Heart size stable. Partial clearing of left base atelectasis and/or infiltrate and left pleural effusion. No pneumothorax. IMPRESSION: 1. Lines and tubes in stable position. 2. Partial clearing of left lung base subsegmental atelectasis and or infiltrate. Partial clearing of left pleural effusion. Electronically Signed   By: Maisie Fus  Register   On: 11/22/2015 07:17   Dg Chest Port 1 View  11/21/2015  CLINICAL DATA:  Intubation. EXAM: PORTABLE CHEST 1 VIEW COMPARISON:  11/20/2015. FINDINGS: Endotracheal tube, NG tube, right IJ line in stable position. Heart size stable. Persistent left base atelectasis and/or infiltrate. Small left pleural effusion. No pneumothorax. IMPRESSION: 1. Lines and tubes stable position. 2. Persistent left base atelectasis and or infiltrate. Persistent small left pleural effusion. Electronically Signed   By: Maisie Fus  Register   On: 11/21/2015 07:21    MEDICATIONS                                                                                                                         Scheduled: . antiseptic oral rinse  7 mL Mouth Rinse QID  . chlorhexidine gluconate  15 mL Mouth Rinse BID  . insulin aspart  0-9 Units Subcutaneous 6 times per day  . latanoprost  1 drop Both Eyes QHS  . levETIRAcetam  1,000 mg Intravenous Q12H  . levothyroxine  50 mcg Per Tube QAC breakfast  . pantoprazole sodium  40 mg Per Tube QHS  . potassium phosphate IVPB (mEq)  40 mEq Intravenous Once    ASSESSMENT/PLAN:                                                                                                            82 year old lady discharged yesterday following in patient management of Escherichia coli UTI, readmitted with AMS and evidence of focal status epilepticus involving right posterior hemisphere. Plan: IV keppra 1 gram BID No electrographic seizures  at least for >12 hours, will wean off propofol MRI pending. D/C c-EEG monitoring.   Wyatt Portela, MD [facility] [phone]  11/22/2015, 10:16 AM

## 2015-11-22 NOTE — Progress Notes (Signed)
PULMONARY / CRITICAL CARE MEDICINE   Name: Megan [name] MRN: [id number] DOB: Jan 11, 1927    VITAL SIGNS: Temp:  [97 F (36.1 C)-98.6 F (37 C)] 98.6 F (37 C) (01/20 0808) Pulse Rate:  [46-66] 62 (01/20 0600) Resp:  [11-16] 15 (01/20 0600) BP: (90-156)/(45-63) 110/48 mmHg (01/20 0600) SpO2:  [100 %] 100 % (01/20 0600) FiO2 (%):  [30 %] 30 % (01/20 0249) Weight:  [60.6 kg (133 lb 9.6 oz)] 60.6 kg (133 lb 9.6 oz) (01/20 0451) HEMODYNAMICS:   VENTILATOR SETTINGS: Vent Mode:  [-] PRVC FiO2 (%):  [30 %] 30 % Set Rate:  [11 bmp] 11 bmp Vt Set:  [480 mL] 480 mL PEEP:  [5 cmH20] 5 cmH20 Plateau Pressure:  [16 cmH20-17 cmH20] 17 cmH20 INTAKE / OUTPUT: Intake/Output      01/19 0701 - 01/20 0700 01/20 0701 - 01/21 0700   I.V. (mL/kg) 1557.4 (25.7)    NG/GT 1070    IV Piggyback 370    Total Intake(mL/kg) 2997.4 (49.5)    Urine (mL/kg/hr) 395 (0.3)    Total Output 395     Net +2602.4            PHYSICAL EXAMINATION: General: Obtunded critically ill female currently on ventilator support  Neuro: Unresponsive  HEENT: ETT in place.  Cardiovascular: no edema, regular rate and rhythm  Lungs: Ant lung fields clear. Vent supported breaths.  Abdomen: soft, +BS, ND  Musculoskeletal: No visible joint deformities  Skin: Warm and Dry   LABS:  CBC  Recent Labs Lab 11/21/15 0400 11/21/15 2033 11/22/15 0509  WBC 10.0 8.6 8.1  HGB 9.9* 10.0* 9.7*  HCT 30.0* 32.1* 31.1*  PLT 127* 122* 121*   Coag's  Recent Labs Lab 11/15/15 2137  APTT 31  INR 1.14   BMET  Recent Labs Lab 11/20/15 0645 11/21/15 0400 11/22/15 0509  NA 142 143 144  K 3.2* 2.8* 3.9  CL 109 111 111  CO2 BUN 15 23* 24*  CREATININE 0.84 0.79 0.61  GLUCOSE 228* 162* 175*   Electrolytes  Recent Labs Lab 11/20/15 0645 11/21/15 0400 11/22/15 0509  CALCIUM 8.5* 8.4* 8.5*  MG 2.4 2.1 1.9  PHOS 2.5 2.1* 1.5*   Sepsis Markers  Recent Labs Lab 11/19/15 1001 11/19/15 1252  11/20/15 1314 11/20/15 1315 11/21/15 0400 11/22/15 0509  LATICACIDVEN 3.53* 2.78*  --  1.5  --   --   PROCALCITON  --   --  0.27  --  0.25 0.16   ABG  Recent Labs Lab 11/20/15 0635 11/21/15 0354 11/22/15 0400  PHART 7.487* 7.514* 7.457*  PCO2ART 32.9* 30.9* 36.3  PO2ART 116* 126* 136*   Liver Enzymes  Recent Labs Lab 11/15/15 2137 11/19/15 0930  AST 21 17  ALT 10* 8*  ALKPHOS 65 67  BILITOT 1.9* 0.8  ALBUMIN 2.8* 2.7*   Cardiac Enzymes No results for input(s): TROPONINI, PROBNP in the last 168 hours. Glucose  Recent Labs Lab 11/21/15 0755 11/21/15 1128 11/21/15 1558 11/21/15 2014 11/21/15 2341 11/22/15 0336  GLUCAP 154* 147* 134* 111* 146* 143*    Imaging Dg Chest Port 1 View  11/22/2015  CLINICAL DATA:  Respiratory failure. EXAM: PORTABLE CHEST 1 VIEW COMPARISON:  11/21/2015 . FINDINGS: Endotracheal tube, NG tube, right IJ line in stable position. Mediastinum hilar structures are normal. Heart size stable. Partial clearing of left base atelectasis and/or infiltrate and left pleural effusion. No pneumothorax. IMPRESSION: 1. Lines and tubes in stable position. 2.  Partial clearing of left lung base subsegmental atelectasis and or infiltrate. Partial clearing of left pleural effusion. Electronically Signed   By: Maisie Fus  Register   On: 11/22/2015 07:17   Dg Chest Port 1 View  11/21/2015  CLINICAL DATA:  Intubation. EXAM: PORTABLE CHEST 1 VIEW COMPARISON:  11/20/2015. FINDINGS: Endotracheal tube, NG tube, right IJ line in stable position. Heart size stable. Persistent left base atelectasis and/or infiltrate. Small left pleural effusion. No pneumothorax. IMPRESSION: 1. Lines and tubes stable position. 2. Persistent left base atelectasis and or infiltrate. Persistent small left pleural effusion. Electronically Signed   By: Maisie Fus  Register   On: 11/21/2015 07:21   STUDIES:  CT head 1/17: no acute finding. Extensive white matter disease  EEG 1/17>>> focal status  epilepticus  Ammonia 1/17>>> 25  CULTURES: BCX2 1/17>>> no growth in 2 days UC 1/16>>>1000 colonies, insignificant  ANTIBIOTICS: Zosyn 1/17>>> vanc 1/17>>1/19  SIGNIFICANT EVENTS: 1/17>> intubated, started on labetalol for HTN 1/18>>intubated. Obtunded. No sedation. BP stable.  1/19>> intubated, continues to be obtunded. Bradycardic with HR in 40s-50s, BP soft with systolic in 90s. Neuro started her on Keppra and Propofol yesterday evening for status epilepticus. Continuous EEG monitoring in place, MRI deferred for sometime later today. 1/20>> intubated, sedated (on propofol). Continuous EEG monitoring in place, MRI still needs to be done. Progressively diminished urine output since admission, net >+9L since admission. Urine output 395cc in the past 24 hrs. However, renal function (SCr and GFR) normal.   LINES/TUBES: Right ij CVL 1/17>>> OETT 1/17>>>  CXR 1/20:Partial clearing of left lung base subsegmental atelectasis and or infiltrate. Partial clearing of left pleural effusion.   ASSESSMENT / PLAN: PULMONARY A: Ventilator Dependency R/T inability to protect airway CXR 11/19/2015 - Mild atelectasis P:  -Full Vent support  -PAD protocol  -D/c propofol   -Follow up ABG  -AM Chest Xray  CARDIOVASCULAR A:  Hypertensive Crisis, Initial Cardiac Enzymes Negative - BP stable now  Tachycardia (resolved) Hypotension now - likely 2/2 propofol infusion  P:  -monitor vitals  -2D Echo pending   RENAL A:  Low urine output - patient having progressively low urine output since admission and net +9L since admission. SCr and GFR normal.  Hypokalemia (resolved) Hypophosphatemia    P:  -Replete electrolytes as needed  -D/c IVF  -BMP in AM     GASTROINTESTINAL A:  Severe Protein Calorie Malnutrition  H/O GERD P:  -Consult Nutrition for tube feeds -Protonix 40 mg IV  INFECTIOUS A: Pneumonia - Procalcitonin down to 0.16 now  P:  -F/u Blood  Cultures -D/c Zoysn -Tracheal aspirate culture pending   ENDOCRINE A:  Hyperglycemia/ DM type 2  H/O Hypothyroidism  P:  -CBG q 4 hours -Continue home dose synthroid   HEMATOLOGIC A: Normocytic anemia - likely due to acute critical illness. Hgb continues to be stable at 9.7. Low suspicion for GI blood loss.  P: -Monitor CBC -if Hgb drops, consider checking CT of abd/pelvis to r/o retroperitoneal bleed  NEUROLOGIC A:  Acute Encephalopathy - Unresponsive etiology unclear rule out seizure verus HTN crisis or PRES or Infection vs polypharmacy, CT Head 11/19/15 showing white Matter Changes. EEG showing focal status epilepticus. AMS could possibly be due to post-ictal state. However, MRI still pending.  P:   -Continue to monitor  -BP control as above  -Neuro to discontinue continuous EEG monitoring and Propofol today.  -MRI pending -keppra 1 gram iv BID -RASS goal: -2  FAMILY  - Updates:  1/18 - Spoke  to patient's son Karma Lew) over the phone to update him about her status.  1/19 - son updated over the phone   Inter-disciplinary family meet or Palliative Care meeting due by: 11/26/2015  TODAY'S SUMMARY:   I have personally obtained a history, examined the patient, evaluated laboratory and imaging results, formulated the assessment and plan and placed orders. CRITICAL CARE: The patient is critically ill with multiple organ systems failure and requires high complexity decision making for assessment and support, frequent evaluation and titration of therapies, application of advanced monitoring technologies and extensive interpretation of multiple databases. Critical Care Time devoted to patient care services described in this note is  minutes.    Pulmonary and Critical Care Medicine [facility] Pager: [phone]  11/22/2015, 8:09 AM

## 2015-11-23 ENCOUNTER — Inpatient Hospital Stay (HOSPITAL_COMMUNITY): Payer: Medicare Other

## 2015-11-23 DIAGNOSIS — I6330 Cerebral infarction due to thrombosis of unspecified cerebral artery: Secondary | ICD-10-CM | POA: Diagnosis present

## 2015-11-23 LAB — MAGNESIUM: MAGNESIUM: 1.7 mg/dL (ref 1.7–2.4)

## 2015-11-23 LAB — BASIC METABOLIC PANEL
Anion gap: 5 (ref 5–15)
BUN: 16 mg/dL (ref 6–20)
CALCIUM: 8.5 mg/dL — AB (ref 8.9–10.3)
CO2: 28 mmol/L (ref 22–32)
CREATININE: 0.46 mg/dL (ref 0.44–1.00)
Chloride: 109 mmol/L (ref 101–111)
GFR calc non Af Amer: >60 mL/min (ref >60)
Glucose, Bld: 186 mg/dL — ABNORMAL HIGH (ref 65–99)
Potassium: 4 mmol/L (ref 3.5–5.1)
SODIUM: 142 mmol/L (ref 135–145)

## 2015-11-23 LAB — BLOOD GAS, ARTERIAL
Acid-Base Excess: 2.9 mmol/L — ABNORMAL HIGH (ref 0.0–2.0)
Bicarbonate: 26.3 mEq/L — ABNORMAL HIGH (ref 20.0–24.0)
DRAWN BY: 441381
FIO2: 0.3
MECHVT: 480 mL
O2 Saturation: 98.4 %
PATIENT TEMPERATURE: 98.6
PCO2 ART: 35.7 mmHg (ref 35.0–45.0)
PEEP/CPAP: 5 cmH2O
PO2 ART: 116 mmHg — AB (ref 80.0–100.0)
RATE: 11 resp/min
TCO2: 27.4 mmol/L (ref 0–100)
pH, Arterial: 7.48 — ABNORMAL HIGH (ref 7.350–7.450)

## 2015-11-23 LAB — CBC
HCT: 32.1 % — ABNORMAL LOW (ref 36.0–46.0)
Hemoglobin: 10.6 g/dL — ABNORMAL LOW (ref 12.0–15.0)
MCH: 30.1 pg (ref 26.0–34.0)
MCHC: 33 g/dL (ref 30.0–36.0)
MCV: 91.2 fL (ref 78.0–100.0)
PLATELETS: 129 10*3/uL — AB (ref 150–400)
RBC: 3.52 MIL/uL — ABNORMAL LOW (ref 3.87–5.11)
RDW: 15.5 % (ref 11.5–15.5)
WBC: 8 10*3/uL (ref 4.0–10.5)

## 2015-11-23 LAB — GLUCOSE, CAPILLARY
GLUCOSE-CAPILLARY: 165 mg/dL — AB (ref 65–99)
GLUCOSE-CAPILLARY: 141 mg/dL — AB (ref 65–99)
Glucose-Capillary: 120 mg/dL — ABNORMAL HIGH (ref 65–99)
Glucose-Capillary: 151 mg/dL — ABNORMAL HIGH (ref 65–99)
Glucose-Capillary: 152 mg/dL — ABNORMAL HIGH (ref 65–99)
Glucose-Capillary: 176 mg/dL — ABNORMAL HIGH (ref 65–99)

## 2015-11-23 LAB — PHOSPHORUS: PHOSPHORUS: 2.9 mg/dL (ref 2.5–4.6)

## 2015-11-23 MED ORDER — MAGNESIUM SULFATE 2 GM/50ML IV SOLN
2.0000 g | Freq: Once | INTRAVENOUS | Status: AC
  Administered 2015-11-23: 2 g via INTRAVENOUS
  Filled 2015-11-23: qty 50

## 2015-11-23 MED ORDER — ASPIRIN 300 MG RE SUPP
300.0000 mg | Freq: Every day | RECTAL | Status: DC
  Administered 2015-11-23 – 2015-11-25 (×3): 300 mg via RECTAL
  Filled 2015-11-23 (×6): qty 1

## 2015-11-23 NOTE — Progress Notes (Signed)
PULMONARY / CRITICAL CARE MEDICINE   Name: SREEJA SPIES MRN: [id number] DOB: 1927-09-06       STUDIES:  CT head 1/17: no acute finding. Extensive white matter disease  EEG 1/17>>> focal status epilepticus  Ammonia 1/17>>> 25  CULTURES: BCX2 1/17>>> no growth in 2 days UC 1/16>>>1000 colonies, insignificant  ANTIBIOTICS: Zosyn 1/17>>> vanc 1/17>>1/19  SIGNIFICANT EVENTS: 1/17>> intubated, started on labetalol for HTN 1/18>>intubated. Obtunded. No sedation. BP stable.  1/19>> intubated, continues to be obtunded. Bradycardic with HR in 40s-50s, BP soft with systolic in 90s. Neuro started her on Keppra and Propofol yesterday evening for status epilepticus. Continuous EEG monitoring in place, MRI deferred for sometime later today. 1/20>> intubated, sedated (on propofol). Continuous EEG monitoring in place, MRI still needs to be done. Progressively diminished urine output since admission, net >+9L since admission. Urine output 395cc in the past 24 hrs. However, renal function (SCr and GFR) normal.   LINES/TUBES: Right ij CVL 1/17>>> OETT 1/17>>>  CXR 1/20:Partial clearing of left lung base subsegmental atelectasis and or infiltrate. Partial clearing of left pleural effusion.    SUBJECTIVE/OVERNIGHT/INTERVAL HX 1/21?17 - MRI with stroke. Per chart - family aware. RASS -4 off sedation. No family around   ASSESSMENT / PLAN:  VITAL SIGNS: Temp:  [97.6 F (36.4 C)-99.7 F (37.6 C)] 99.7 F (37.6 C) (01/21 0821) Pulse Rate:  [57-72] 64 (01/21 1000) Resp:  [12-26] 21 (01/21 1000) BP: (101-201)/(50-81) 135/51 mmHg (01/21 1000) SpO2:  [99 %-100 %] 100 % (01/21 1000) FiO2 (%):  [30 %] 30 % (01/21 0749) Weight:  [61.6 kg (135 lb 12.9 oz)] 61.6 kg (135 lb 12.9 oz) (01/21 0406) HEMODYNAMICS:   VENTILATOR SETTINGS: Vent Mode:  [-] CPAP;PSV FiO2 (%):  [30 %] 30 % Set Rate:  [11 bmp] 11 bmp Vt Set:  [480 mL] 480 mL PEEP:  [5 cmH20] 5 cmH20 Pressure Support:  [10 cmH20] 10  cmH20 Plateau Pressure:  [16 cmH20-20 cmH20] 20 cmH20 INTAKE / OUTPUT: Intake/Output      01/20 0701 - 01/21 0700 01/21 0701 - 01/22 0700   I.V. (mL/kg) 1281.8 (20.8) 30 (0.5)   NG/GT 1325 180   IV Piggyback 572.5 110   Total Intake(mL/kg) 3179.3 (51.6) 320 (5.2)   Urine (mL/kg/hr) 1265 (0.9) 325 (1.5)   Total Output 1265 325   Net +1914.3 -5          PHYSICAL EXAMINATION: General: Obtunded critically ill female currently on ventilator support  Neuro: Unresponsive  HEENT: ETT in place.  Cardiovascular: no edema, regular rate and rhythm  Lungs: Ant lung fields clear. Vent supported breaths.  Abdomen: soft, +BS, ND  Musculoskeletal: No visible joint deformities  Skin: Warm and Dry   LABS:  CBC  Recent Labs Lab 11/21/15 2033 11/22/15 0509 11/23/15 0355  WBC 8.6 8.1 8.0  HGB 10.0* 9.7* 10.6*  HCT 32.1* 31.1* 32.1*  PLT 122* 121* 129*   Coag's No results for input(s): APTT, INR in the last 168 hours. BMET  Recent Labs Lab 11/21/15 0400 11/22/15 0509 11/23/15 0355  NA 143 144 142  K 2.8* 3.9 4.0  CL 111 111 109  CO2 BUN 23* 24* 16  CREATININE 0.79 0.61 0.46  GLUCOSE 162* 175* 186*   Electrolytes  Recent Labs Lab 11/21/15 0400 11/22/15 0509 11/23/15 0355  CALCIUM 8.4* 8.5* 8.5*  MG 2.1 1.9 1.7  PHOS 2.1* 1.5* 2.9   Sepsis Markers  Recent Labs Lab 11/19/15 1001 11/19/15 1252  11/20/15 1314 11/20/15 1315 11/21/15 0400 11/22/15 0509  LATICACIDVEN 3.53* 2.78*  --  1.5  --   --   PROCALCITON  --   --  0.27  --  0.25 0.16   ABG  Recent Labs Lab 11/21/15 0354 11/22/15 0400 11/23/15 0330  PHART 7.514* 7.457* 7.480*  PCO2ART 30.9* 36.3 35.7  PO2ART 126* 136* 116*   Liver Enzymes  Recent Labs Lab 11/19/15 0930  AST 17  ALT 8*  ALKPHOS 67  BILITOT 0.8  ALBUMIN 2.7*   Cardiac Enzymes No results for input(s): TROPONINI, PROBNP in the last 168 hours. Glucose  Recent Labs Lab 11/22/15 1145 11/22/15 1627  11/22/15 1955 11/23/15 0007 11/23/15 0357 11/23/15 0820  GLUCAP 101* 102* 138* 120* 165* 151*    Imaging Mr Brain Wo Contrast  11/22/2015  CLINICAL DATA:  Patient was found unresponsive. Recent hospitalization for urinary tract infection. Seizure activity on EEG. EXAM: MRI HEAD WITHOUT CONTRAST TECHNIQUE: Multiplanar, multiecho pulse sequences of the brain and surrounding structures were obtained without intravenous contrast. COMPARISON:  CT head 11/19/2015.  MR head 08/26/2013. FINDINGS: Large area of restricted diffusion throughout much of the medial LEFT frontal lobe, anterior cerebral artery territory, representing acute infarction. Patchy restricted diffusion LEFT occipital cortex. Larger confluent area of restricted diffusion in the LEFT parieto-occipital cortex and subcortical white matter, likely boundary zone between MCA and PCA. Multifocal areas of subcentimeter size acute infarction in the RIGHT parieto-occipital region, cortex and subcortical white matter, could also represent boundary zone phenomenon, versus shower of emboli. None of these demonstrate hemorrhage on MR. Generalized atrophy. Hydrocephalus ex vacuo. Extensive focal and confluent T2 and FLAIR hyperintensities throughout the white matter, likely chronic microvascular ischemic change. Flow voids are maintained in the carotid, basilar, and both vertebral arteries. Pituitary and cerebellar tonsils unremarkable. No significant paranasal sinus disease. BILATERAL cataract extraction. Trace mastoid fluid. IMPRESSION: Acute nonhemorrhagic LEFT ACA territory infarction affecting much of the medial LEFT frontal lobe. Thrombosis of the LEFT anterior cerebral artery is suspected. Acute LEFT parieto-occipital boundary zone infarct between the LEFT MCA and PCA vascular territories. Multifocal subcentimeter infarction RIGHT parieto-occipital cortex and white matter, probably also representing boundary zone infarction, versus shower of emboli.  Advanced atrophy and small vessel disease. Electronically Signed   By: Elsie Stain M.D.   On: 11/22/2015 17:06   Dg Chest Port 1 View  11/23/2015  CLINICAL DATA:  Status post intubation. EXAM: PORTABLE CHEST 1 VIEW COMPARISON:  11/22/2015 FINDINGS: Enteric catheter terminates 4 cm above the carina. Right internal jugular central venous catheter terminates at cavoatrial junction. Enteric catheter tip is collimated off the image. Cardiomediastinal silhouette is normal. Mediastinal contours appear intact. There is no evidence of pneumothorax. There is a persistent left lower lobe atelectasis versus airspace consolidation, and a small left pleural effusion. Osseous structures are without acute abnormality. Soft tissues are grossly normal. IMPRESSION: Persistent left lower lobe atelectasis versus airspace consolidation with associated small left pleural effusion. Stable appearance of the supporting apparatus. Electronically Signed   By: Ted Mcalpine M.D.   On: 11/23/2015 08:56   Dg Chest Port 1 View  11/22/2015  CLINICAL DATA:  Respiratory failure. EXAM: PORTABLE CHEST 1 VIEW COMPARISON:  11/21/2015 . FINDINGS: Endotracheal tube, NG tube, right IJ line in stable position. Mediastinum hilar structures are normal. Heart size stable. Partial clearing of left base atelectasis and/or infiltrate and left pleural effusion. No pneumothorax. IMPRESSION: 1. Lines and tubes in stable position. 2. Partial clearing of left lung base subsegmental  atelectasis and or infiltrate. Partial clearing of left pleural effusion. Electronically Signed   By: Maisie Fus  Register   On: 11/22/2015 07:17  PULMONARY A: Acute resp failure due to COMA/Acute stroke  P -Full Vent support  -PAD protocol  -D/c propofol   -Follow up ABG  -AM Chest Xray  CARDIOVASCULAR A:  Hypertensive Crisis, Initial Cardiac Enzymes Negative - BP stable now  Tachycardia (resolved) Hypotension now - likely 2/2 propofol infusion    - nil  acute P:  -monitor vitals  -2D Echo pending   RENAL A:   Hypomag   P:  -Replete mag and other  electrolytes as needed  -D/c IVF  -BMP in AM     GASTROINTESTINAL A:  Severe Protein Calorie Malnutrition  H/O GERD P:  -Consult Nutrition for tube feeds -Protonix 40 mg IV  INFECTIOUS A: Pneumonia - Procalcitonin down to 0.16 now  P:  -F/u Blood Cultures -D/c Zoysn -Tracheal aspirate culture pending   ENDOCRINE A:  Hyperglycemia/ DM type 2  H/O Hypothyroidism  P:  -CBG q 4 hours -Continue home dose synthroid   HEMATOLOGIC A: Normocytic anemia - likely due to acute critical illness. Hgb continues to be stable at 9.7. Low suspicion for GI blood loss.  P: -Monitor CBC -if Hgb drops, consider checking CT of abd/pelvis to r/o retroperitoneal bleed  NEUROLOGIC A:  Acute Encephalopathy - with COMA/RASS -4 equivalent with status and MRI showing multiple acute strokes  P:   --keppra 1 gram iv BID -RASS goal: -2 but she ias -4 spontaneously  - Neuro input appreciated  FAMILY  - Updates:  1/18 - Spoke to patient's son Karma Lew) over the phone to update him about her status.  1/19 - son updated over the phone  1/20 - resident updated famiy 11/23/15 - poor pgornosis. Needs goals. No family at bedside. Will need neuro/stroke input before goals of care which we can aim for 1/22?17   Inter-disciplinary family meet or Palliative Care meeting due by: 11/26/2015     The patient is critically ill with multiple organ systems failure and requires high complexity decision making for assessment and support, frequent evaluation and titration of therapies, application of advanced monitoring technologies and extensive interpretation of multiple databases.   Critical Care Time devoted to patient care services described in this note is  30  Minutes. This time reflects time of care of this signee Dr Kalman Shan. This critical care time does not  reflect procedure time, or teaching time or supervisory time of PA/NP/Med student/Med Resident etc but could involve care discussion time    Dr. Kalman Shan, M.D., [facility] Pulmonary and Critical Care Medicine Staff Physician [facility] [place] [facility] Pager: [phone], If no answer or between  15:00h - 7:00h: call [phone]  11/23/2015 10:32 AM

## 2015-11-23 NOTE — Progress Notes (Signed)
STROKE TEAM PROGRESS NOTE   HISTORY OF PRESENT ILLNESS DEAMBER BUCKHALTER is an 82 y.o. female history of hypertension, diastolic dysfunction, hypothyroidism, type 2 diabetes mellitus and dementia who was discharged yesterday following management of an Escherichia coli UTI, and readmitted today after being found unresponsive. Patient was intubated on arrival in the ED. She has remained unresponsive except for noxious stimuli. CT scan of her head showed no acute intracranial abnormality. EEG showed marked asymmetry in cerebral activity with findings indicative of focal subclinical status epilepticus involving left hemisphere. Right hemisphere stroke cannot be ruled out. Patient has no history of seizure disorder. No frank clinical seizure activity has been reported.   SUBJECTIVE (INTERVAL HISTORY) No family is at bedside. Patient is intubated, not sedated. Eyes closed, does not respond to voice or sternal rub.    OBJECTIVE Temp:  [97.6 F (36.4 C)-99.7 F (37.6 C)] 99.7 F (37.6 C) (01/21 0821) Pulse Rate:  [57-72] 70 (01/21 1130) Cardiac Rhythm:  [-] Normal sinus rhythm (01/21 1000) Resp:  [12-26] 21 (01/21 1130) BP: (101-201)/(50-81) 134/50 mmHg (01/21 1130) SpO2:  [99 %-100 %] 100 % (01/21 1130) FiO2 (%):  [30 %] 30 % (01/21 1130) Weight:  [61.6 kg (135 lb 12.9 oz)] 61.6 kg (135 lb 12.9 oz) (01/21 0406)  CBC:  Recent Labs Lab 11/19/15 0930  11/22/15 0509 11/23/15 0355  WBC 9.2  < > 8.1 8.0  NEUTROABS 7.1  --   --   --   HGB 15.6*  < > 9.7* 10.6*  HCT 47.5*  < > 31.1* 32.1*  MCV 91.7  < > 93.4 91.2  PLT 153  < > 121* 129*  < > = values in this interval not displayed.  Basic Metabolic Panel:   Recent Labs Lab 11/22/15 0509 11/23/15 0355  NA 144 142  K 3.9 4.0  CL 111 109  CO2 27 28  GLUCOSE 175* 186*  BUN 24* 16  CREATININE 0.61 0.46  CALCIUM 8.5* 8.5*  MG 1.9 1.7  PHOS 1.5* 2.9    Lipid Panel:     Component Value Date/Time   CHOL 162 12/19/2014 1541   TRIG 65  11/20/2015 1912   HDL 77 12/19/2014 1541   CHOLHDL 2.1 12/19/2014 1541   VLDL 15 12/19/2014 1541   LDLCALC 70 12/19/2014 1541   HgbA1c:  Lab Results  Component Value Date   HGBA1C 6.0* 12/19/2014   Urine Drug Screen:     Component Value Date/Time   LABOPIA NONE DETECTED 11/15/2015 2210   COCAINSCRNUR NONE DETECTED 11/15/2015 2210   LABBENZ NONE DETECTED 11/15/2015 2210   AMPHETMU NONE DETECTED 11/15/2015 2210   THCU NONE DETECTED 11/15/2015 2210   LABBARB NONE DETECTED 11/15/2015 2210      IMAGING  Mr Brain Wo Contrast 11/22/2015   Acute nonhemorrhagic LEFT ACA territory infarction affecting much of the medial LEFT frontal lobe. Thrombosis of the LEFT anterior cerebral artery is suspected. Acute LEFT parieto-occipital boundary zone infarct between the LEFT MCA and PCA vascular territories. Multifocal subcentimeter infarction RIGHT parieto-occipital cortex and white matter, probably also representing boundary zone infarction, versus shower of emboli. Advanced atrophy and small vessel disease.     Dg Chest Port 1 View 11/22/2015   1. Lines and tubes in stable position.  2. Partial clearing of left lung base subsegmental atelectasis and or infiltrate. Partial clearing of left pleural effusion.      PHYSICAL EXAM Frail elderly african [place] lady who is intubated . Afebrile. Head is nontraumatic.  Neck is supple without bruit. Cardiac exam no murmur or gallop.   Neurological Exam : Intubated. Non verbal. Does not open eyes to sternal rub. No gaze deviation, EOMI to oculocephalic maneuver. Pupils small and slightly reactive. No apparent facial asymmetry but difficult due to tube. Increased tone in the right upper extremity otherwise decreased tone. Does not blink to threat in any quadrants. Attempted fundoscopy could not visualize due to small pupils. Withdraws in all extremities more briskly on the left. No spontaneous movements.  DTRs were symmetrical. Toes equiv. Unable ti  test gait.    ASSESSMENT/PLAN Ms. TAHEERA THOMANN is a 82 y.o. female with history of hypertension, hyperlipidemia, diabetes mellitus, and dementia, presenting with unresponsiveness. She did not receive IV t-PA due to unknown time of onset.  Stroke:  Dominant infarcts possibly embolic from an unknown source.  Resultant unresponsive.  MRI - Left ACA territory infarction affecting much of the medial LEFT frontal lobe.   MRA - not performed  Carotid Doppler - will order  2D Echo - EF 55-60%. No cardiac source of emboli identified.  LDL - will order  HgbA1c - will order  VTE prophylaxis - SCDs    aspirin 325 mg daily prior to admission, now on No antithrombotic - will order aspirin suppository  Ongoing aggressive stroke risk factor management  Therapy recommendations: Pending  Disposition: Pending  Hypertension  Stable  Permissive hypertension (OK if < 220/120) but gradually normalize in 5-7 days  Hyperlipidemia  Home meds: No lipid lowering medications prior to admission.  LDL pending, goal < 70   Diabetes  HgbA1c pending, goal < 7.0  Uncontrolled  Other Stroke Risk Factors  Advanced age   Other Active Problems  Anemia  Hospital day # 4  Delton See PA-C [facility] Pager [phone] 11/23/2015, 11:53 AM    Personally examined patient and images, and have participated in and made any corrections needed to history, physical, neuro exam,assessment and plan as stated above.  I have personally obtained the history, evaluated lab date, reviewed imaging studies and agree with radiology interpretations.    Naomie Dean, MD Stroke Neurology [phone] [facility]   To contact Stroke Continuity provider, please refer to [email or web address]. After hours, contact General Neurology

## 2015-11-24 ENCOUNTER — Inpatient Hospital Stay (HOSPITAL_COMMUNITY): Payer: Medicare Other

## 2015-11-24 LAB — CBC
HCT: 29.4 % — ABNORMAL LOW (ref 36.0–46.0)
HEMOGLOBIN: 9.6 g/dL — AB (ref 12.0–15.0)
MCH: 30 pg (ref 26.0–34.0)
MCHC: 32.7 g/dL (ref 30.0–36.0)
MCV: 91.9 fL (ref 78.0–100.0)
Platelets: 124 10*3/uL — ABNORMAL LOW (ref 150–400)
RBC: 3.2 MIL/uL — ABNORMAL LOW (ref 3.87–5.11)
RDW: 15.6 % — ABNORMAL HIGH (ref 11.5–15.5)
WBC: 8.5 10*3/uL (ref 4.0–10.5)

## 2015-11-24 LAB — BLOOD GAS, ARTERIAL
ACID-BASE EXCESS: 4.1 mmol/L — AB (ref 0.0–2.0)
BICARBONATE: 27.6 meq/L — AB (ref 20.0–24.0)
Drawn by: 252031
FIO2: 0.3
LHR: 11 {breaths}/min
O2 Saturation: 98.4 %
PCO2 ART: 37.9 mmHg (ref 35.0–45.0)
PEEP: 5 cmH2O
Patient temperature: 98.6
TCO2: 28.7 mmol/L (ref 0–100)
VT: 480 mL
pH, Arterial: 7.476 — ABNORMAL HIGH (ref 7.350–7.450)
pO2, Arterial: 116 mmHg — ABNORMAL HIGH (ref 80.0–100.0)

## 2015-11-24 LAB — CULTURE, BLOOD (ROUTINE X 2)
Culture: NO GROWTH
Culture: NO GROWTH

## 2015-11-24 LAB — BASIC METABOLIC PANEL
Anion gap: 6 (ref 5–15)
BUN: 19 mg/dL (ref 6–20)
CHLORIDE: 108 mmol/L (ref 101–111)
CO2: 28 mmol/L (ref 22–32)
CREATININE: 0.43 mg/dL — AB (ref 0.44–1.00)
Calcium: 8.4 mg/dL — ABNORMAL LOW (ref 8.9–10.3)
GFR calc Af Amer: >60 mL/min (ref >60)
GFR calc non Af Amer: >60 mL/min (ref >60)
GLUCOSE: 174 mg/dL — AB (ref 65–99)
Potassium: 3.9 mmol/L (ref 3.5–5.1)
SODIUM: 142 mmol/L (ref 135–145)

## 2015-11-24 LAB — GLUCOSE, CAPILLARY
GLUCOSE-CAPILLARY: 132 mg/dL — AB (ref 65–99)
GLUCOSE-CAPILLARY: 157 mg/dL — AB (ref 65–99)
GLUCOSE-CAPILLARY: 129 mg/dL — AB (ref 65–99)
Glucose-Capillary: 147 mg/dL — ABNORMAL HIGH (ref 65–99)
Glucose-Capillary: 157 mg/dL — ABNORMAL HIGH (ref 65–99)
Glucose-Capillary: 133 mg/dL — ABNORMAL HIGH (ref 65–99)

## 2015-11-24 LAB — LIPID PANEL
CHOL/HDL RATIO: 3.9 ratio
Cholesterol: 109 mg/dL (ref 0–200)
HDL: 28 mg/dL — AB (ref >40)
LDL CALC: 70 mg/dL (ref 0–99)
TRIGLYCERIDES: 55 mg/dL (ref <150)
VLDL: 11 mg/dL (ref 0–40)

## 2015-11-24 LAB — PHOSPHORUS: Phosphorus: 2.2 mg/dL — ABNORMAL LOW (ref 2.5–4.6)

## 2015-11-24 LAB — MAGNESIUM: MAGNESIUM: 2.1 mg/dL (ref 1.7–2.4)

## 2015-11-24 MED ORDER — SODIUM PHOSPHATE 3 MMOLE/ML IV SOLN
10.0000 mmol | Freq: Once | INTRAVENOUS | Status: AC
  Administered 2015-11-24: 10 mmol via INTRAVENOUS
  Filled 2015-11-24: qty 3.33

## 2015-11-24 NOTE — Progress Notes (Signed)
[facility] ADULT ICU REPLACEMENT PROTOCOL FOR AM LAB REPLACEMENT ONLY  The patient does apply for the [facility] Adult ICU Electrolyte Replacment Protocol based on the criteria listed below:   1. Is GFR >/= 40 ml/min? Yes.    Patient's GFR today is >60 2. Is urine output >/= 0.5 ml/kg/hr for the last 6 hours? Yes.   Patient's UOP is 0.79 ml/kg/hr 3. Is BUN < 60 mg/dL? Yes.    Patient's BUN today is 19 4. Abnormal electrolyte(s): Phosphate, 2.2 5. Ordered repletion with: [facility] adult ICU replacement protocol 6. If a panic level lab has been reported, has the CCM MD in charge been notified? Yes.  .   Physician:  Dr. Lanora Manis Deterding  [facility], Alda Berthold E 11/24/2015 5:37 AM

## 2015-11-24 NOTE — Progress Notes (Signed)
abg collected  

## 2015-11-24 NOTE — Progress Notes (Addendum)
Multi-disciplinary discussion  Members: Medical staff, nursing staff and several family members  Discussion  Long d/w pt's daughters and sons. They presented her advanced directives during this meeting. She had directed No Prolonged Life Support OR Mechanical ventilation. We discussed the current findings including: stroke, seizures (now controlled), ability to spontaneously ventilate. We discussed that the current stroke will almost certainly result in severe deficits and the need for life-long assist w/ ADLs to possibly trach/PEG. The majority of the family was very firm that this was not a route the pt would want to go. She does have one daughter Thelma Barge who desires [place] to do Every thing, but she defers to the rest of the families direction. I think a big component of this is the fact that Francis's son actually died in the same room that Ms Lafever is currently in. The results of this discussion are as follows.   1) Full DNR 2) continue current rx w/ no de-escalation at this point.  3) On 1/24 we will re-visit w/ the family at 2pm. The plan will be to extubate at that point w/ no re-intubation. We will need to discuss possibility of PEG tube at that point should she be able to protect her airway. The family understands her prognosis is grim.    30 minutes  Simonne Martinet ACNP-BC [facility] Pulmonary/Critical Care Pager # [phone] OR # [phone] if no answer

## 2015-11-24 NOTE — Progress Notes (Signed)
PULMONARY / CRITICAL CARE MEDICINE   Name: Megan [name] MRN: [id number] DOB: Mar 18, 1927    BRIEF PATIENT DESCRIPTION: 82 year-old with recent pan sensitive E coli UTI admitted with altered mental status, hypertensive crisis. CXR consistent with PNA. EEG showing status epilepticus. MRI showing stroke with dominant infarcts possibly embolic from an unknown source.   STUDIES:  CT head 1/17: no acute finding. Extensive white matter disease  EEG 1/17>>> focal status epilepticus  MRI 1/20>>Acute nonhemorrhagic LEFT ACA territory infarction affecting much of the medial LEFT frontal lobe. Thrombosis of the LEFT anterior cerebral artery is suspected. Acute LEFT parieto-occipital boundary zone infarct between the LEFT MCA and PCA vascular territories. Multifocal subcentimeter infarction RIGHT parieto-occipital cortex and white matter, probably also representing boundary zone infarction, versus shower of emboli. Advanced atrophy and small vessel disease. CXR 1/22:   CULTURES: BCX2 1/17>>> no growth in 4 days UC 1/16>>>1000 colonies, insignificant  ANTIBIOTICS: Zosyn 1/17>>1/20 vanc 1/17>>1/19  SIGNIFICANT EVENTS: 1/17>> intubated, started on labetalol for HTN 1/18>>intubated. Obtunded. No sedation. BP stable.  1/19>> intubated, continues to be obtunded. Bradycardic with HR in 40s-50s, BP soft with systolic in 90s. Neuro started her on Keppra and Propofol yesterday evening for status epilepticus. Continuous EEG monitoring in place, MRI deferred for sometime later today. 1/20>> intubated, sedated (on propofol). Continuous EEG monitoring in place, MRI still needs to be done. Progressively diminished urine output since admission, net >+9L since admission. Urine output 395cc in the past 24 hrs. However, renal function (SCr and GFR) normal.  11/23/15 - MRI with stroke. Per chart - family aware. RASS -4 off sedation. No family around  LINES/TUBES: Right ij CVL 1/17>>> OETT  1/17>>>   SUBJECTIVE/OVERNIGHT/INTERVAL HX 1/21>>intubated, no sedation. As per nursing staff, blinking and having unpurposeful movements.   ASSESSMENT / PLAN:  VITAL SIGNS: Temp:  [98.5 F (36.9 C)-100.2 F (37.9 C)] 98.5 F (36.9 C) (01/22 0403) Pulse Rate:  [59-76] 68 (01/22 0600) Resp:  [11-23] 11 (01/22 0600) BP: (114-157)/(43-60) 126/47 mmHg (01/22 0600) SpO2:  [98 %-100 %] 98 % (01/22 0600) FiO2 (%):  [30 %] 30 % (01/22 0600) Weight:  [60.3 kg (132 lb 15 oz)] 60.3 kg (132 lb 15 oz) (01/22 0327) HEMODYNAMICS:   VENTILATOR SETTINGS: Vent Mode:  [-] PRVC FiO2 (%):  [30 %] 30 % Set Rate:  [11 bmp] 11 bmp Vt Set:  [480 mL] 480 mL PEEP:  [5 cmH20] 5 cmH20 Pressure Support:  [5 cmH20-10 cmH20] 5 cmH20 Plateau Pressure:  [14 cmH20-21 cmH20] 17 cmH20 INTAKE / OUTPUT: Intake/Output      01/21 0701 - 01/22 0700   I.V. (mL/kg) 230 (3.8)   NG/GT 1180   IV Piggyback 312   Total Intake(mL/kg) 1722 (28.6)   Urine (mL/kg/hr) 1305 (0.9)   Total Output 1305   Net +417         PHYSICAL EXAMINATION: General: On vent Neuro: Unresponsive  HEENT: ETT in place.  Cardiovascular: RRR, S1 and S2 appreciated  Lungs: Ant lung fields clear. Vent supported breaths.  Abdomen: soft, +BS, ND  Musculoskeletal: No visible joint deformities  Skin: Warm and Dry   LABS:  CBC  Recent Labs Lab 11/22/15 0509 11/23/15 0355 11/24/15 0301  WBC 8.1 8.0 8.5  HGB 9.7* 10.6* 9.6*  HCT 31.1* 32.1* 29.4*  PLT 121* 129* 124*   Coag's No results for input(s): APTT, INR in the last 168 hours. BMET  Recent Labs Lab 11/22/15 0509 11/23/15 0355 11/24/15 0301  NA 144 142 142  K 3.9 4.0 3.9  CL 111 109 108  CO2 BUN 24* 16 19  CREATININE 0.61 0.46 0.43*  GLUCOSE 175* 186* 174*   Electrolytes  Recent Labs Lab 11/22/15 0509 11/23/15 0355 11/24/15 0301  CALCIUM 8.5* 8.5* 8.4*  MG 1.9 1.7 2.1  PHOS 1.5* 2.9 2.2*   Sepsis Markers  Recent Labs Lab 11/19/15 1001  11/19/15 1252 11/20/15 1314 11/20/15 1315 11/21/15 0400 11/22/15 0509  LATICACIDVEN 3.53* 2.78*  --  1.5  --   --   PROCALCITON  --   --  0.27  --  0.25 0.16   ABG  Recent Labs Lab 11/22/15 0400 11/23/15 0330 11/24/15 0240  PHART 7.457* 7.480* 7.476*  PCO2ART 36.3 35.7 37.9  PO2ART 136* 116* 116*   Liver Enzymes  Recent Labs Lab 11/19/15 0930  AST 17  ALT 8*  ALKPHOS 67  BILITOT 0.8  ALBUMIN 2.7*   Cardiac Enzymes No results for input(s): TROPONINI, PROBNP in the last 168 hours. Glucose  Recent Labs Lab 11/23/15 0820 11/23/15 1205 11/23/15 1610 11/23/15 2007 11/24/15 0019 11/24/15 0323  GLUCAP 151* 152* 176* 141* 147* 157*    Imaging Mr Brain Wo Contrast  11/22/2015  CLINICAL DATA:  Patient was found unresponsive. Recent hospitalization for urinary tract infection. Seizure activity on EEG. EXAM: MRI HEAD WITHOUT CONTRAST TECHNIQUE: Multiplanar, multiecho pulse sequences of the brain and surrounding structures were obtained without intravenous contrast. COMPARISON:  CT head 11/19/2015.  MR head 08/26/2013. FINDINGS: Large area of restricted diffusion throughout much of the medial LEFT frontal lobe, anterior cerebral artery territory, representing acute infarction. Patchy restricted diffusion LEFT occipital cortex. Larger confluent area of restricted diffusion in the LEFT parieto-occipital cortex and subcortical white matter, likely boundary zone between MCA and PCA. Multifocal areas of subcentimeter size acute infarction in the RIGHT parieto-occipital region, cortex and subcortical white matter, could also represent boundary zone phenomenon, versus shower of emboli. None of these demonstrate hemorrhage on MR. Generalized atrophy. Hydrocephalus ex vacuo. Extensive focal and confluent T2 and FLAIR hyperintensities throughout the white matter, likely chronic microvascular ischemic change. Flow voids are maintained in the carotid, basilar, and both vertebral arteries.  Pituitary and cerebellar tonsils unremarkable. No significant paranasal sinus disease. BILATERAL cataract extraction. Trace mastoid fluid. IMPRESSION: Acute nonhemorrhagic LEFT ACA territory infarction affecting much of the medial LEFT frontal lobe. Thrombosis of the LEFT anterior cerebral artery is suspected. Acute LEFT parieto-occipital boundary zone infarct between the LEFT MCA and PCA vascular territories. Multifocal subcentimeter infarction RIGHT parieto-occipital cortex and white matter, probably also representing boundary zone infarction, versus shower of emboli. Advanced atrophy and small vessel disease. Electronically Signed   By: Elsie Stain M.D.   On: 11/22/2015 17:06   Dg Chest Port 1 View  11/23/2015  CLINICAL DATA:  Status post intubation. EXAM: PORTABLE CHEST 1 VIEW COMPARISON:  11/22/2015 FINDINGS: Enteric catheter terminates 4 cm above the carina. Right internal jugular central venous catheter terminates at cavoatrial junction. Enteric catheter tip is collimated off the image. Cardiomediastinal silhouette is normal. Mediastinal contours appear intact. There is no evidence of pneumothorax. There is a persistent left lower lobe atelectasis versus airspace consolidation, and a small left pleural effusion. Osseous structures are without acute abnormality. Soft tissues are grossly normal. IMPRESSION: Persistent left lower lobe atelectasis versus airspace consolidation with associated small left pleural effusion. Stable appearance of the supporting apparatus. Electronically Signed   By: Ted Mcalpine M.D.   On: 11/23/2015 08:56  PULMONARY A: Acute  resp failure due to COMA/Acute stroke  P -Full Vent support  -PAD protocol   -Follow up ABG  -AM Chest Xray  CARDIOVASCULAR A:  Hypertensive Crisis, Initial Cardiac Enzymes Negative - BP stable now  Tachycardia (resolved) P:  -monitor vitals  -2D Echo pending   RENAL A:   Hypomagnesemia  Hypophosphatemia  Diminished  urine output (improving) - SCr 0.43, GFR >60. Urine output 1.3 L and net +0.4L in the past 24 hrs.    P:  -Replete mag and other electrolytes as needed  -BMP in AM     GASTROINTESTINAL A:  Severe Protein Calorie Malnutrition  H/O GERD P:  -Consult Nutrition for tube feeds -Protonix 40 mg IV  INFECTIOUS A: Pneumonia - Procalcitonin down to 0.16  P:  -F/u Blood Cultures -Finished antibiotic treatment  -Tracheal aspirate culture pending   ENDOCRINE A:  Hyperglycemia/ DM type 2  H/O Hypothyroidism  P:  -CBG q 4 hours -Continue home dose synthroid   HEMATOLOGIC A: Normocytic anemia - likely due to acute critical illness. Hgb continues to be stable at 9.6. Low suspicion for GI blood loss.  P: -Monitor CBC -if Hgb drops, consider checking CT of abd/pelvis to r/o retroperitoneal bleed  NEUROLOGIC A:  Acute Encephalopathy - with COMA/RASS -4 equivalent with status and MRI showing multiple acute strokes  P:   -RASS goal: -2 but she has -4 spontaneously -Aspirin 300 mg suppository daily  -Continue Keppra 1 gram iv BID -LDL 70. Pending carotid doppler, A1c  - Neuro input appreciated  FAMILY  - Updates:  1/18 - Spoke to patient's son Karma Lew) over the phone to update him about her status.  1/19 - son updated over the phone  1/20 - resident updated famiy 11/23/15 - poor pgornosis. Needs goals. No family at bedside. Will need neuro/stroke input before goals of care which we can aim for 1/22?17 11/24/15>> spoke to patient's son Karma Lew over the phone. Family had goals of care discussion with Mr. Tanja Port, NP this morning. Patient is now DNR, please refer to his note for further details.    The patient is critically ill with multiple organ systems failure and requires high complexity decision making for assessment and support, frequent evaluation and titration of therapies, application of advanced monitoring technologies and extensive  interpretation of multiple databases.   Critical Care Time devoted to patient care services described in this note is    Minutes. This time reflects time of care of this signee Dr Kalman Shan. This critical care time does not reflect procedure time, or teaching time or supervisory time of PA/NP/Med student/Med Resident etc but could involve care discussion time

## 2015-11-24 NOTE — Progress Notes (Signed)
The Stroke Team will sign off at this time. Please call if we can be of further service.   Delton See PA-C [facility] Pager [phone] 11/24/2015, 10:04 AM

## 2015-11-25 DIAGNOSIS — I6330 Cerebral infarction due to thrombosis of unspecified cerebral artery: Secondary | ICD-10-CM

## 2015-11-25 LAB — PHOSPHORUS: PHOSPHORUS: 2.2 mg/dL — AB (ref 2.5–4.6)

## 2015-11-25 LAB — GLUCOSE, CAPILLARY
GLUCOSE-CAPILLARY: 133 mg/dL — AB (ref 65–99)
GLUCOSE-CAPILLARY: 79 mg/dL (ref 65–99)
Glucose-Capillary: 146 mg/dL — ABNORMAL HIGH (ref 65–99)
Glucose-Capillary: 150 mg/dL — ABNORMAL HIGH (ref 65–99)
Glucose-Capillary: 141 mg/dL — ABNORMAL HIGH (ref 65–99)
Glucose-Capillary: 140 mg/dL — ABNORMAL HIGH (ref 65–99)

## 2015-11-25 LAB — BASIC METABOLIC PANEL
ANION GAP: 8 (ref 5–15)
BUN: 19 mg/dL (ref 6–20)
CALCIUM: 8.7 mg/dL — AB (ref 8.9–10.3)
CO2: 29 mmol/L (ref 22–32)
Chloride: 106 mmol/L (ref 101–111)
Creatinine, Ser: 0.45 mg/dL (ref 0.44–1.00)
GFR calc Af Amer: >60 mL/min (ref >60)
GLUCOSE: 156 mg/dL — AB (ref 65–99)
Potassium: 3.8 mmol/L (ref 3.5–5.1)
Sodium: 143 mmol/L (ref 135–145)

## 2015-11-25 LAB — HEMOGLOBIN A1C
Hgb A1c MFr Bld: 6.4 % — ABNORMAL HIGH (ref 4.8–5.6)
MEAN PLASMA GLUCOSE: 137 mg/dL

## 2015-11-25 LAB — CBC
HCT: 30.1 % — ABNORMAL LOW (ref 36.0–46.0)
HEMOGLOBIN: 9.9 g/dL — AB (ref 12.0–15.0)
MCH: 30.4 pg (ref 26.0–34.0)
MCHC: 32.9 g/dL (ref 30.0–36.0)
MCV: 92.3 fL (ref 78.0–100.0)
PLATELETS: 138 10*3/uL — AB (ref 150–400)
RBC: 3.26 MIL/uL — ABNORMAL LOW (ref 3.87–5.11)
RDW: 15.5 % (ref 11.5–15.5)
WBC: 8.8 10*3/uL (ref 4.0–10.5)

## 2015-11-25 LAB — MAGNESIUM: MAGNESIUM: 1.9 mg/dL (ref 1.7–2.4)

## 2015-11-25 MED ORDER — SODIUM CHLORIDE 0.9 % IJ SOLN
10.0000 mL | INTRAMUSCULAR | Status: DC | PRN
Start: 2015-11-25 — End: 2015-12-04

## 2015-11-25 MED ORDER — POTASSIUM PHOSPHATES 15 MMOLE/5ML IV SOLN
40.0000 meq | Freq: Once | INTRAVENOUS | Status: AC
  Administered 2015-11-25: 40 meq via INTRAVENOUS
  Filled 2015-11-25: qty 9.09

## 2015-11-25 MED ORDER — SODIUM CHLORIDE 0.9 % IJ SOLN
10.0000 mL | Freq: Two times a day (BID) | INTRAMUSCULAR | Status: DC
Start: 2015-11-25 — End: 2015-11-25
  Administered 2015-11-25: 10 mL

## 2015-11-25 NOTE — Progress Notes (Signed)
PULMONARY / CRITICAL CARE MEDICINE   Name: Megan [name] MRN: [id number] DOB: [birth year]    BRIEF PATIENT DESCRIPTION: 82 year-old with recent pan sensitive E coli UTI admitted with altered mental status, hypertensive crisis. CXR consistent with PNA. EEG showing status epilepticus. MRI showing stroke with dominant infarcts possibly embolic from an unknown source.   STUDIES:  CT head 1/17: no acute finding. Extensive white matter disease  EEG 1/17: focal status epilepticus  TTE 1/18:  LV normal in size w/ EF 55-60% & grade 1 diastolic dysfunction. PASP . Moderate aortic regurg. No PFO. MRI 1/20: Acute nonhemorrhagic LEFT ACA territory infarction affecting much of the medial LEFT frontal lobe. Thrombosis of the LEFT anterior cerebral artery is suspected. Acute LEFT parieto-occipital boundary zone infarct between the LEFT MCA and PCA vascular territories. Multifocal subcentimeter infarction RIGHT parieto-occipital cortex and white matter, probably also representing boundary zone infarction, versus shower of emboli. Advanced atrophy and small vessel disease. CXR 1/22: Persistent LLL opacity.  MICROBIOLOGY: Blood Ctx x2 (1/17):  Negative Urine Ctx (1/17):  1000 colonies, insignificant Urine Ctx (1/13):  E coli  ANTIBIOTICS: Zosyn 1/17- 1/20 Vancomycin 1/17 - 1/19  SIGNIFICANT EVENTS: 1/17 - intubated, started on labetalol for HTN 1/18 - intubated. Obtunded. No sedation. BP stable.  1/19 - intubated, continues to be obtunded. Bradycardic with HR in 40s-50s, BP soft with systolic in 90s. Neuro started her on Keppra and Propofol yesterday evening for status epilepticus. Continuous EEG monitoring in place, MRI deferred for sometime later today. 1/20 - intubated, sedated (on propofol). Continuous EEG monitoring in place, MRI still needs to be done. Progressively diminished urine output since admission, net >+9L since admission. Urine output 395cc in the past 24 hrs. However, renal function  (SCr and GFR) normal.  1/21 - MRI with stroke. Per chart - family aware. RASS -4 off sedation. No family around  LINES/TUBES: Right IJ CVL 1/17>>> OETT 1/17>>> OGT 1/17>>> Foley Catheter 1/17>>> PIV x1  SUBJECTIVE/OVERNIGHT/INTERVAL HX: No acute events overnight. Patient more responsive this morning.  REVIEW OF SYSTEMS:  Unobtainable as the patient is intubated.  VITAL SIGNS: Temp:  [97.8 F (36.6 C)-99.8 F (37.7 C)] 97.8 F (36.6 C) (01/23 0411) Pulse Rate:  [46-101] 65 (01/23 0600) Resp:  [7-22] 16 (01/23 0600) BP: (126-188)/(51-85) 129/59 mmHg (01/23 0600) SpO2:  [99 %-100 %] 100 % (01/23 0600) FiO2 (%):  [30 %] 30 % (01/23 0500) Weight:  [134 lb 0.6 oz (60.8 kg)] 134 lb 0.6 oz (60.8 kg) (01/23 0500) HEMODYNAMICS:   VENTILATOR SETTINGS: Vent Mode:  [-] PRVC FiO2 (%):  [30 %] 30 % Set Rate:  [11 bmp] 11 bmp Vt Set:  [480 mL] 480 mL PEEP:  [5 cmH20] 5 cmH20 Pressure Support:  [5 cmH20] 5 cmH20 Plateau Pressure:  [16 cmH20-19 cmH20] 16 cmH20 INTAKE / OUTPUT: Intake/Output      01/22 0701 - 01/23 0700   I.V. (mL/kg) 210 (3.5)   NG/GT 1050   IV Piggyback 220   Total Intake(mL/kg) 1480 (24.3)   Urine (mL/kg/hr) 1145 (0.8)   Total Output 1145   Net +335         PHYSICAL EXAMINATION: General:  Remains on ventilator. No acute distress. No family at bedside.  Integument:  Warm & dry. No rash on exposed skin.  HEENT:  Endotracheal tube in place. Pupils pinpoint and equal bilaterally. Cardiovascular:  Regular rate. No edema. No appreciable JVD.  Pulmonary:  Good aeration & clear to auscultation bilaterally. Symmetric chest wall rise  on ventilator. Abdomen: Soft. Normal bowel sounds. Nondistended.  Neurological: Patient appears to have triple flexion in left lower extremity. Withdraws to pain in bilateral upper extremities as well as right lower extremity. Cough is present. Grimaces with pain. Still not tracking or following commands. Does not open eyes to  voice.  LABS:  CBC  Recent Labs Lab 11/23/15 0355 11/24/15 0301 11/25/15 0345  WBC 8.0 8.5 8.8  HGB 10.6* 9.6* 9.9*  HCT 32.1* 29.4* 30.1*  PLT 129* 124* 138*   Coag's No results for input(s): APTT, INR in the last 168 hours. BMET  Recent Labs Lab 11/23/15 0355 11/24/15 0301 11/25/15 0345  NA 142 142 143  K 4.0 3.9 3.8  CL 109 108 106  CO2 BUN CREATININE 0.46 0.43* 0.45  GLUCOSE 186* 174* 156*   Electrolytes  Recent Labs Lab 11/23/15 0355 11/24/15 0301 11/25/15 0345  CALCIUM 8.5* 8.4* 8.7*  MG 1.7 2.1 1.9  PHOS 2.9 2.2* 2.2*   Sepsis Markers  Recent Labs Lab 11/19/15 1001 11/19/15 1252 11/20/15 1314 11/20/15 1315 11/21/15 0400 11/22/15 0509  LATICACIDVEN 3.53* 2.78*  --  1.5  --   --   PROCALCITON  --   --  0.27  --  0.25 0.16   ABG  Recent Labs Lab 11/22/15 0400 11/23/15 0330 11/24/15 0240  PHART 7.457* 7.480* 7.476*  PCO2ART 36.3 35.7 37.9  PO2ART 136* 116* 116*   Liver Enzymes  Recent Labs Lab 11/19/15 0930  AST 17  ALT 8*  ALKPHOS 67  BILITOT 0.8  ALBUMIN 2.7*   Cardiac Enzymes No results for input(s): TROPONINI, PROBNP in the last 168 hours. Glucose  Recent Labs Lab 11/24/15 0751 11/24/15 1142 11/24/15 1545 11/24/15 2017 11/25/15 0019 11/25/15 0411  GLUCAP 132* 157* 129* 133* 146* 150*    Imaging Dg Chest Port 1 View  11/24/2015  CLINICAL DATA:  Status post intubation. EXAM: PORTABLE CHEST 1 VIEW COMPARISON:  11/23/2015 FINDINGS: Endotracheal tube, enteric catheter, right internal jugular approach central venous catheter stable. Cardiomediastinal silhouette is normal. Mediastinal contours appear intact. Atherosclerotic disease of the aorta is noted. There is persistent left lower lobe airspace consolidation and a small pleural effusion. No evidence of pneumothorax. Osseous structures are without acute abnormality. Soft tissues are grossly normal. IMPRESSION: Persistent left lower lobe airspace  consolidation versus atelectasis with a small pleural effusion. Stable support apparatus. Electronically Signed   By: Ted Mcalpine M.D.   On: 11/24/2015 08:29   ASSESSMENT / PLAN: PULMONARY A: Acute Respiratory Failure - Secondary to CVA & inability to protect airway. Increasing Secretions LLL Opacity  P: Continued Ventilator Support Possible 1 way extubation tomorrow (1/24)  CARDIOVASCULAR A:  Hypertensive Emergency - Resolved.  Tachycardia - Resolved.  P:  Monitor in telemetry.  RENAL A:  Hypophosphatemia - Mild.  Hypomagnesemia - Resolved.   P:  KPhos IV x1 Monitor electrolytes daily Trending urine output with foley  Trending renal function daily with BUN/Creatinine  GASTROINTESTINAL A:  Severe Protein Calorie Malnutrition  H/O GERD  P:  Tolerating tube feedings Protonix via tube daily  INFECTIOUS A:  Pneumonia - Procalcitonin improving. E coli UTI - Ctx on 1/13 w/ repeat Ctx w/ insignificant growth.  P:  Finished course of antibiotics Plan to re-culture for fever  ENDOCRINE A:  DM Type 2 - BG controlled. H/O Hypothyroidism   P:  Accu-Checks q4hr Low Dose SSI per algorithm Synthroid  VT  HEMATOLOGIC A:  Anemia -  Normocytic. No evidence of active bleeding.  P: Trending Hgb daily with CBC SCDs for prophylaxis  NEUROLOGIC A:  Acute Encephalopathy - MRI showing multiple acute strokes. Focal Seizure - Seen on EEG.  P:   RASS Goal:  0 to -1 ASA  PR daily Keppra 1gm IV q12hr Neurology has signed off Fentanyl IV prn  FAMILY  - Updates:  1/18 - Spoke to patient's son Karma Lew) over the phone to update him about her status.  1/19 - son updated over the phone  1/20 - resident updated famiy 11/23/15 - poor pgornosis. Needs goals. No family at bedside. Will need neuro/stroke input before goals of care which we can aim for 1/22?17 11/24/15>> spoke to patient's son Karma Lew over the  phone. Family had goals of care discussion with Mr. Tanja Port, NP this morning. Patient is now DNR, please refer to his note for further details.   TODAY'S SUMMARY:  82 year old female with multiple CVA and altered mentation. Patient is currently planned for one way extubation on 1/24. She does have minimal response to pain today which seems to indicate a slight improvement in her neurologic status. Need to discuss patient and family goals further regarding plan for tomorrow.  I have spent a total of 37 minutes of critical care time today caring for the patient and reviewing the patient's electronic medical record.  Megan [name], M.D. [facility] Pager:  [phone] After 3pm or if no response, call [phone]

## 2015-11-25 NOTE — Plan of Care (Signed)
Problem: Phase I Progression Outcomes Goal: VTE prophylaxis Outcome: Progressing SCDs

## 2015-11-25 NOTE — Progress Notes (Signed)
PULMONARY / CRITICAL CARE MEDICINE   Name: Megan [name] MRN: [id number] DOB: Mar 03, 1927    BRIEF PATIENT DESCRIPTION: 82 year-old with recent pan sensitive E coli UTI admitted with altered mental status, hypertensive crisis. CXR consistent with PNA. EEG showing status epilepticus. MRI showing stroke with dominant infarcts possibly embolic from an unknown source.   STUDIES:  CT head 1/17: no acute finding. Extensive white matter disease  EEG 1/17>>> focal status epilepticus  MRI 1/20>>Acute nonhemorrhagic LEFT ACA territory infarction affecting much of the medial LEFT frontal lobe. Thrombosis of the LEFT anterior cerebral artery is suspected. Acute LEFT parieto-occipital boundary zone infarct between the LEFT MCA and PCA vascular territories. Multifocal subcentimeter infarction RIGHT parieto-occipital cortex and white matter, probably also representing boundary zone infarction, versus shower of emboli. Advanced atrophy and small vessel disease. CXR 1/22:   CULTURES: BCX2 1/17>>> no growth in 5 days UC 1/16>>>1000 colonies, insignificant  ANTIBIOTICS: Zosyn 1/17>>1/20 vanc 1/17>>1/19  SIGNIFICANT EVENTS: 1/17>> intubated, started on labetalol for HTN 1/18>>intubated. Obtunded. No sedation. BP stable.  1/19>> intubated, continues to be obtunded. Bradycardic with HR in 40s-50s, BP soft with systolic in 90s. Neuro started her on Keppra and Propofol yesterday evening for status epilepticus. Continuous EEG monitoring in place, MRI deferred for sometime later today. 1/20>> intubated, sedated (on propofol). Continuous EEG monitoring in place, MRI still needs to be done. Progressively diminished urine output since admission, net >+9L since admission. Urine output 395cc in the past 24 hrs. However, renal function (SCr and GFR) normal.  11/23/15 - MRI with stroke. Per chart - family aware. RASS -4 off sedation. No family around 1/22>>intubated, no sedation. As per nursing staff, blinking and  having unpurposeful movements. LINES/TUBES: Right ij CVL 1/17>>> OETT 1/17>>>   SUBJECTIVE/OVERNIGHT/INTERVAL HX 1/23>>intubated, no sedation. Opened eyes when I touched her shoulder. Not following commands. No tracking with her eyes.    ASSESSMENT / PLAN:  VITAL SIGNS: Temp:  [97.8 F (36.6 C)-99.8 F (37.7 C)] 97.8 F (36.6 C) (01/23 0411) Pulse Rate:  [46-101] 71 (01/23 0700) Resp:  [7-21] 14 (01/23 0700) BP: (126-188)/(51-85) 149/61 mmHg (01/23 0700) SpO2:  [99 %-100 %] 100 % (01/23 0700) FiO2 (%):  [30 %] 30 % (01/23 0740) Weight:  [60.8 kg (134 lb 0.6 oz)] 60.8 kg (134 lb 0.6 oz) (01/23 0500) HEMODYNAMICS:   VENTILATOR SETTINGS: Vent Mode:  [-] PSV;CPAP FiO2 (%):  [30 %] 30 % Set Rate:  [11 bmp] 11 bmp Vt Set:  [480 mL] 480 mL PEEP:  [5 cmH20] 5 cmH20 Pressure Support:  [5 cmH20-8 cmH20] 8 cmH20 Plateau Pressure:  [16 cmH20-19 cmH20] 16 cmH20 INTAKE / OUTPUT: Intake/Output      01/22 0701 - 01/23 0700 01/23 0701 - 01/24 0700   I.V. (mL/kg) 210 (3.5)    NG/GT 1050    IV Piggyback 220    Total Intake(mL/kg) 1480 (24.3)    Urine (mL/kg/hr) 1145 (0.8)    Total Output 1145     Net +335            PHYSICAL EXAMINATION: General: On vent Neuro: Opens eyes with stimulation. Not tracking with eyes. Not following commands.  HEENT: ETT in place.  Cardiovascular: RRR, S1 and S2 appreciated  Lungs: Ant lung fields clear. Vent supported breaths.  Abdomen: soft, +BS, ND  Musculoskeletal: No visible joint deformities  Skin: Warm and Dry   LABS:  CBC  Recent Labs Lab 11/23/15 0355 11/24/15 0301 11/25/15 0345  WBC 8.0 8.5 8.8  HGB  10.6* 9.6* 9.9*  HCT 32.1* 29.4* 30.1*  PLT 129* 124* 138*   Coag's No results for input(s): APTT, INR in the last 168 hours. BMET  Recent Labs Lab 11/23/15 0355 11/24/15 0301 11/25/15 0345  NA 142 142 143  K 4.0 3.9 3.8  CL 109 108 106  CO2 BUN CREATININE 0.46 0.43* 0.45  GLUCOSE 186* 174*  156*   Electrolytes  Recent Labs Lab 11/23/15 0355 11/24/15 0301 11/25/15 0345  CALCIUM 8.5* 8.4* 8.7*  MG 1.7 2.1 1.9  PHOS 2.9 2.2* 2.2*   Sepsis Markers  Recent Labs Lab 11/19/15 1001 11/19/15 1252 11/20/15 1314 11/20/15 1315 11/21/15 0400 11/22/15 0509  LATICACIDVEN 3.53* 2.78*  --  1.5  --   --   PROCALCITON  --   --  0.27  --  0.25 0.16   ABG  Recent Labs Lab 11/22/15 0400 11/23/15 0330 11/24/15 0240  PHART 7.457* 7.480* 7.476*  PCO2ART 36.3 35.7 37.9  PO2ART 136* 116* 116*   Liver Enzymes  Recent Labs Lab 11/19/15 0930  AST 17  ALT 8*  ALKPHOS 67  BILITOT 0.8  ALBUMIN 2.7*   Cardiac Enzymes No results for input(s): TROPONINI, PROBNP in the last 168 hours. Glucose  Recent Labs Lab 11/24/15 0751 11/24/15 1142 11/24/15 1545 11/24/15 2017 11/25/15 0019 11/25/15 0411  GLUCAP 132* 157* 129* 133* 146* 150*    Imaging Dg Chest Port 1 View  11/24/2015  CLINICAL DATA:  Status post intubation. EXAM: PORTABLE CHEST 1 VIEW COMPARISON:  11/23/2015 FINDINGS: Endotracheal tube, enteric catheter, right internal jugular approach central venous catheter stable. Cardiomediastinal silhouette is normal. Mediastinal contours appear intact. Atherosclerotic disease of the aorta is noted. There is persistent left lower lobe airspace consolidation and a small pleural effusion. No evidence of pneumothorax. Osseous structures are without acute abnormality. Soft tissues are grossly normal. IMPRESSION: Persistent left lower lobe airspace consolidation versus atelectasis with a small pleural effusion. Stable support apparatus. Electronically Signed   By: Ted Mcalpine M.D.   On: 11/24/2015 08:29  PULMONARY A: Acute resp failure due to Acute stroke  P -Full Vent support  -PAD protocol   -Follow up ABG  -AM Chest Xray  CARDIOVASCULAR A:  Hypertensive Crisis, Initial Cardiac Enzymes Negative - BP stable now  Tachycardia (resolved) P:  -monitor vitals   -2D Echo pending   RENAL A:   Hypomagnesemia Hypophosphatemia  Diminished urine output (improving) - SCr 0.4, GFR >60. Urine output 1.1 L and net +0.3L in the past 24 hrs.    P:  -Replete mag and other electrolytes as needed  -BMP in AM     GASTROINTESTINAL A:  Severe Protein Calorie Malnutrition  H/O GERD P:  -Consult Nutrition for tube feeds -Protonix 40 mg IV  INFECTIOUS A: Pneumonia - Procalcitonin down to 0.16 >final blood cx negative  P:  -Finished antibiotic treatment  -Tracheal aspirate culture pending   ENDOCRINE A:  Hyperglycemia/ DM type 2  H/O Hypothyroidism  P:  -CBG q 4 hours -Continue home dose synthroid   HEMATOLOGIC A: Normocytic anemia - likely due to acute critical illness. Hgb continues to be stable at 9.9. Low suspicion for GI blood loss.  P: -Monitor CBC -if Hgb drops, consider checking CT of abd/pelvis to r/o retroperitoneal bleed  NEUROLOGIC A:  Acute Encephalopathy - MRI showing multiple acute strokes  P:   -RASS goal: 0 -Aspirin 300 mg suppository daily  -Continue Keppra 1 gram iv BID -  LDL 70. Pending A1c  - Neuro input appreciated  FAMILY  - Updates:  1/18 - Spoke to patient's son Karma Lew) over the phone to update him about her status.  1/19 - son updated over the phone  1/20 - resident updated famiy 11/23/15 - poor pgornosis. Needs goals. No family at bedside. Will need neuro/stroke input before goals of care which we can aim for 1/22?17 11/24/15>> spoke to patient's son Karma Lew over the phone. Family had goals of care discussion with Mr. Tanja Port, NP this morning. Patient is now DNR, please refer to his note for further details.    The patient is critically ill with multiple organ systems failure and requires high complexity decision making for assessment and support, frequent evaluation and titration of therapies, application of advanced monitoring technologies and extensive  interpretation of multiple databases.   Critical Care Time devoted to patient care services described in this note is    Minutes. This time reflects time of care of this signee Dr Kalman Shan. This critical care time does not reflect procedure time, or teaching time or supervisory time of PA/NP/Med student/Med Resident etc but could involve care discussion time

## 2015-11-25 NOTE — Plan of Care (Signed)
Problem: Phase I Progression Outcomes Goal: GIProphysixis Outcome: Completed/Met Date Met:  11/25/15 Protonix

## 2015-11-26 DIAGNOSIS — J9601 Acute respiratory failure with hypoxia: Secondary | ICD-10-CM

## 2015-11-26 LAB — RENAL FUNCTION PANEL
ANION GAP: 5 (ref 5–15)
Albumin: 1.7 g/dL — ABNORMAL LOW (ref 3.5–5.0)
BUN: 18 mg/dL (ref 6–20)
CALCIUM: 8.6 mg/dL — AB (ref 8.9–10.3)
CHLORIDE: 107 mmol/L (ref 101–111)
CO2: 29 mmol/L (ref 22–32)
Creatinine, Ser: 0.42 mg/dL — ABNORMAL LOW (ref 0.44–1.00)
GFR calc non Af Amer: >60 mL/min (ref >60)
Glucose, Bld: 147 mg/dL — ABNORMAL HIGH (ref 65–99)
PHOSPHORUS: 2.9 mg/dL (ref 2.5–4.6)
POTASSIUM: 4 mmol/L (ref 3.5–5.1)
SODIUM: 141 mmol/L (ref 135–145)

## 2015-11-26 LAB — GLUCOSE, CAPILLARY
GLUCOSE-CAPILLARY: 155 mg/dL — AB (ref 65–99)
Glucose-Capillary: 129 mg/dL — ABNORMAL HIGH (ref 65–99)
Glucose-Capillary: 134 mg/dL — ABNORMAL HIGH (ref 65–99)
Glucose-Capillary: 134 mg/dL — ABNORMAL HIGH (ref 65–99)
Glucose-Capillary: 142 mg/dL — ABNORMAL HIGH (ref 65–99)
Glucose-Capillary: 147 mg/dL — ABNORMAL HIGH (ref 65–99)

## 2015-11-26 LAB — CBC
HEMATOCRIT: 29.5 % — AB (ref 36.0–46.0)
HEMOGLOBIN: 9.5 g/dL — AB (ref 12.0–15.0)
MCH: 29.6 pg (ref 26.0–34.0)
MCHC: 32.2 g/dL (ref 30.0–36.0)
MCV: 91.9 fL (ref 78.0–100.0)
Platelets: 150 10*3/uL (ref 150–400)
RBC: 3.21 MIL/uL — ABNORMAL LOW (ref 3.87–5.11)
RDW: 15.3 % (ref 11.5–15.5)
WBC: 9.2 10*3/uL (ref 4.0–10.5)

## 2015-11-26 LAB — MAGNESIUM: Magnesium: 1.8 mg/dL (ref 1.7–2.4)

## 2015-11-26 MED ORDER — HEPARIN SODIUM (PORCINE) 5000 UNIT/ML IJ SOLN
5000.0000 [IU] | Freq: Three times a day (TID) | INTRAMUSCULAR | Status: DC
  Administered 2015-11-26 – 2015-12-04 (×24): 5000 [IU] via SUBCUTANEOUS
  Filled 2015-11-26 (×24): qty 1

## 2015-11-26 MED ORDER — SENNOSIDES 8.8 MG/5ML PO SYRP
5.0000 mL | ORAL_SOLUTION | Freq: Two times a day (BID) | ORAL | Status: DC
Start: 2015-11-26 — End: 2015-11-27
  Administered 2015-11-26 – 2015-11-27 (×3): 5 mL
  Filled 2015-11-26 (×4): qty 5

## 2015-11-26 MED ORDER — ASPIRIN 81 MG PO CHEW
324.0000 mg | CHEWABLE_TABLET | Freq: Every day | ORAL | Status: DC
  Administered 2015-11-26 – 2015-11-27 (×2): 324 mg
  Filled 2015-11-26 (×2): qty 4

## 2015-11-26 MED ORDER — FUROSEMIDE 10 MG/ML IJ SOLN
20.0000 mg | Freq: Once | INTRAMUSCULAR | Status: AC
  Administered 2015-11-26: 20 mg via INTRAVENOUS
  Filled 2015-11-26: qty 2

## 2015-11-26 MED ORDER — FUROSEMIDE 10 MG/ML IJ SOLN
10.0000 mg | Freq: Once | INTRAMUSCULAR | Status: AC
  Administered 2015-11-26: 10 mg via INTRAVENOUS
  Filled 2015-11-26: qty 2

## 2015-11-26 NOTE — Progress Notes (Signed)
Just spoke to patient's health care power of attorney Mr. [name] over the phone. He does NOT wish to extubate the patient today (11/26/15). Mr. Lynnea Ferrier wants to have another family meeting tomorrow 11/27/15 at 2 pm to decide whether to extubate the patient or not.

## 2015-11-26 NOTE — Progress Notes (Signed)
PULMONARY / CRITICAL CARE MEDICINE   Name: Megan [name] MRN: [id number] DOB: 1927-04-22    BRIEF PATIENT DESCRIPTION: 82 year-old with recent pan sensitive E coli UTI admitted with altered mental status, hypertensive crisis. CXR consistent with PNA. EEG showing status epilepticus. MRI showing stroke with dominant infarcts possibly embolic from an unknown source.   STUDIES:  CT Head 1/17: No acute finding. Extensive white matter disease  EEG 1/17: Focal status epilepticus  TTE 1/18: LV normal in size w/ EF 55-60% & grade 1 diastolic dysfunction. PASP . Moderate aortic regurg. No PFO. MRI 1/20: Acute nonhemorrhagic LEFT ACA territory infarction affecting much of the medial LEFT frontal lobe. Thrombosis of the LEFT anterior cerebral artery is suspected. Acute LEFT parieto-occipital boundary zone infarct between the LEFT MCA and PCA vascular territories. Multifocal subcentimeter infarction RIGHT parieto-occipital cortex and white matter, probably also representing boundary zone infarction, versus shower of emboli. Advanced atrophy and small vessel disease. CXR 1/22: Persistent LLL opacity.  MICROBIOLOGY: Blood Ctx x2 (1/17): Negative Urine Ctx (1/17): 1000 colonies, insignificant Urine Ctx (1/13): E coli  ANTIBIOTICS: Zosyn 1/17- 1/20 Vancomycin 1/17 - 1/19  SIGNIFICANT EVENTS: 1/17 - intubated & started on labetalol for HTN 1/18 - Obtunded off sedation. Started on Keppra for Seizures by Neurology. 1/19 - Continues to be obtunded & bradycardic with HR in 40s-50s. 1/21 - MRI with bilateral CVA & family made aware.  1/23 - Eyes opening but not following commands.   LINES/TUBES: Right IJ CVL 1/17>>> OETT 1/17>>> OGT 1/17>>> Foley Catheter 1/17>>> PIV x1  SUBJECTIVE/OVERNIGHT/INTERVAL HX: No acute events overnight. Reportedly having purposeful movements with left hand by staff.  REVIEW OF SYSTEMS: Unobtainable as the patient is intubated.  VITAL SIGNS: Temp:  [97.9  F (36.6 C)-99.8 F (37.7 C)] 97.9 F (36.6 C) (01/24 0351) Pulse Rate:  [63-83] 66 (01/24 0600) Resp:  [0-27] 0 (01/24 0600) BP: (124-194)/(46-102) 137/52 mmHg (01/24 0600) SpO2:  [99 %-100 %] 100 % (01/24 0600) FiO2 (%):  [30 %] 30 % (01/24 0600) Weight:  [60.7 kg (133 lb 13.1 oz)] 60.7 kg (133 lb 13.1 oz) (01/24 0333) HEMODYNAMICS:   VENTILATOR SETTINGS: Vent Mode:  [-] PRVC FiO2 (%):  [30 %] 30 % Set Rate:  [11 bmp] 11 bmp Vt Set:  [480 mL] 480 mL PEEP:  [5 cmH20] 5 cmH20 Pressure Support:  [8 cmH20] 8 cmH20 Plateau Pressure:  [16 cmH20-18 cmH20] 17 cmH20 INTAKE / OUTPUT: Intake/Output      01/23 0701 - 01/24 0700   I.V. (mL/kg) 250 (4.1)   NG/GT 1340   IV Piggyback 729.1   Total Intake(mL/kg) 2319.1 (38.2)   Urine (mL/kg/hr) 1325 (0.9)   Total Output 1325   Net +994.1         PHYSICAL EXAMINATION: General: On vent Neuro: Opens eyes on command. Tracking with eyes. Squeezes fingers on command. Not moving lower extremities on command.   HEENT: ETT in place.  Cardiovascular: RRR, S1 and S2 appreciated  Lungs: Ant lung fields clear. Vent supported breaths.  Abdomen: soft, +BS, ND  Musculoskeletal: No visible joint deformities. Bilateral upper extremities (dorsum of hands) edematous.  Skin: Warm and Dry   LABS:  CBC  Recent Labs Lab 11/24/15 0301 11/25/15 0345 11/26/15 0353  WBC 8.5 8.8 9.2  HGB 9.6* 9.9* 9.5*  HCT 29.4* 30.1* 29.5*  PLT 124* 138* 150   Coag's No results for input(s): APTT, INR in the last 168 hours. BMET  Recent Labs Lab 11/24/15 0301 11/25/15 0345  11/26/15 0353  NA 142 143 141  K 3.9 3.8 4.0  CL 108 106 107  CO2 BUN CREATININE 0.43* 0.45 0.42*  GLUCOSE 174* 156* 147*   Electrolytes  Recent Labs Lab 11/24/15 0301 11/25/15 0345 11/26/15 0353  CALCIUM 8.4* 8.7* 8.6*  MG 2.1 1.9 1.8  PHOS 2.2* 2.2* 2.9   Sepsis Markers  Recent Labs Lab 11/19/15 1001 11/19/15 1252 11/20/15 1314  11/20/15 1315 11/21/15 0400 11/22/15 0509  LATICACIDVEN 3.53* 2.78*  --  1.5  --   --   PROCALCITON  --   --  0.27  --  0.25 0.16   ABG  Recent Labs Lab 11/22/15 0400 11/23/15 0330 11/24/15 0240  PHART 7.457* 7.480* 7.476*  PCO2ART 36.3 35.7 37.9  PO2ART 136* 116* 116*   Liver Enzymes  Recent Labs Lab 11/19/15 0930 11/26/15 0353  AST 17  --   ALT 8*  --   ALKPHOS 67  --   BILITOT 0.8  --   ALBUMIN 2.7* 1.7*   Cardiac Enzymes No results for input(s): TROPONINI, PROBNP in the last 168 hours. Glucose  Recent Labs Lab 11/25/15 0808 11/25/15 1158 11/25/15 1540 11/25/15 1936 11/26/15 0004 11/26/15 0352  GLUCAP 141* 133* 79 140* 147* 134*    Imaging No results found.   ASSESSMENT/PLAN:  NEUROLOGIC A:  Acute Encephalopathy - MRI showing multiple acute strokes. Fentanyl 1 dose 1/17 & Propofol 1/17 -1/20. Focal Seizure - Seen on EEG.  P:   RASS Goal: 0 to -1 ASA  VT daily Keppra 1gm IV q12hr Neurology has signed off Fentanyl IV prn (last given 1/17)  PULMONARY A: Acute Respiratory Failure - Secondary to CVA & inability to protect airway. Increasing Secretions LLL Opacity   P: Continued Ventilator Support Gentle Diuresis with Lasix IV x1 Possible 1 way extubation tomorrow (1/24)   CARDIOVASCULAR A:  Hypertensive Emergency - Resolved.  Tachycardia - Resolved.  P:  Monitor in telemetry.  RENAL A:  Hypophosphatemia - Resolved. Hypomagnesemia - Resolved.  P:  Monitor electrolytes daily Trending urine output with foley  Trending renal function daily with BUN/Creatinine  GASTROINTESTINAL A:  Severe Protein Calorie Malnutrition  H/O GERD  P:  Tolerating tube feedings Protonix via tube daily Starting Senna bid for bowel movement  INFECTIOUS A:  Pneumonia - Procalcitonin improving. E coli UTI - Ctx on 1/13 w/ repeat Ctx w/ insignificant growth.  P:  Finished course of antibiotics Plan to re-culture  for fever  ENDOCRINE A:  DM Type 2 - BG controlled. A1c 6.4. H/O Hypothyroidism   P:  Accu-Checks q4hr Low Dose SSI per algorithm Synthroid  VT  HEMATOLOGIC A:  Anemia - Normocytic. No evidence of active bleeding.  P: Trending Hgb daily with CBC SCDs  Heparin [place] q8hr  FAMILY  - Updates:  1/18 - Spoke to patient's son Karma Lew) over the phone to update him about her status.  1/19 - son updated over the phone  1/20 - resident updated famiy 1/21 - poor pgornosis. Needs goals. No family at bedside. Will need neuro/stroke input before goals of care which we can aim for  1/22 - spoke to patient's son Karma Lew over the phone. Family had goals of care discussion with Mr. Tanja Port, NP this morning. Patient is now DNR, please refer to his note for further details.  1/24 - length discussion at bedside & via phone regarding patient's failed SBT this morning and not following commands with dismal  chance of returning to independent lifestyle.  TODAY'S SUMMARY: 82 year old female with multiple CVA and altered mentation. Patient previously planned for one way extubation on 1/24. Patient still has no purposeful movement. I reviewed documents from her family including her son Karma Lew which names him as her healthcare power of attorney. I spent a significant amount of time this afternoon discussing the patient's failed spontaneous breathing trial this morning and continued ventilator support requirements as well as dismal prognosis for neurologic recovery and lack of purposeful movement despite intermittent spontaneous movements. This was reinforced repeatedly this afternoon with family at bedside. All were in agreement that the patient would not wish to be maintained on the ventilator in her current state without hope of recovery to independent state of living. However, the patient's family wished to leave her intubated until a repeat family discussion tomorrow afternoon  at 2 PM. She remains DO NOT RESUSCITATE.  I have spent a total of 60 minutes of critical care time today caring for the patient, discussing the plan of care with the patient's family and reviewing the patient's electronic medical record.  Donna Christen Jamison Neighbor, M.D. [facility] Pager: [phone] After 3pm or if no response, call [phone]

## 2015-11-27 DIAGNOSIS — G40901 Epilepsy, unspecified, not intractable, with status epilepticus: Secondary | ICD-10-CM

## 2015-11-27 LAB — GLUCOSE, CAPILLARY
GLUCOSE-CAPILLARY: 124 mg/dL — AB (ref 65–99)
GLUCOSE-CAPILLARY: 128 mg/dL — AB (ref 65–99)
GLUCOSE-CAPILLARY: 76 mg/dL (ref 65–99)
Glucose-Capillary: 131 mg/dL — ABNORMAL HIGH (ref 65–99)
Glucose-Capillary: 160 mg/dL — ABNORMAL HIGH (ref 65–99)
Glucose-Capillary: 111 mg/dL — ABNORMAL HIGH (ref 65–99)

## 2015-11-27 LAB — RENAL FUNCTION PANEL
ALBUMIN: 1.7 g/dL — AB (ref 3.5–5.0)
ANION GAP: 13 (ref 5–15)
BUN: 17 mg/dL (ref 6–20)
CO2: 30 mmol/L (ref 22–32)
Calcium: 8.3 mg/dL — ABNORMAL LOW (ref 8.9–10.3)
Chloride: 97 mmol/L — ABNORMAL LOW (ref 101–111)
Creatinine, Ser: 0.46 mg/dL (ref 0.44–1.00)
Glucose, Bld: 145 mg/dL — ABNORMAL HIGH (ref 65–99)
PHOSPHORUS: 2.7 mg/dL (ref 2.5–4.6)
POTASSIUM: 3.4 mmol/L — AB (ref 3.5–5.1)
Sodium: 140 mmol/L (ref 135–145)

## 2015-11-27 LAB — CBC
HEMATOCRIT: 29.7 % — AB (ref 36.0–46.0)
Hemoglobin: 9.4 g/dL — ABNORMAL LOW (ref 12.0–15.0)
MCH: 29.1 pg (ref 26.0–34.0)
MCHC: 31.6 g/dL (ref 30.0–36.0)
MCV: 92 fL (ref 78.0–100.0)
Platelets: 168 10*3/uL (ref 150–400)
RBC: 3.23 MIL/uL — ABNORMAL LOW (ref 3.87–5.11)
RDW: 15.2 % (ref 11.5–15.5)
WBC: 8.6 10*3/uL (ref 4.0–10.5)

## 2015-11-27 LAB — MAGNESIUM: Magnesium: 1.7 mg/dL (ref 1.7–2.4)

## 2015-11-27 MED ORDER — FUROSEMIDE 10 MG/ML IJ SOLN
20.0000 mg | Freq: Two times a day (BID) | INTRAMUSCULAR | Status: AC
Start: 2015-11-27 — End: 2015-11-27
  Administered 2015-11-27 (×2): 20 mg via INTRAVENOUS
  Filled 2015-11-27 (×2): qty 2

## 2015-11-27 MED ORDER — POTASSIUM CHLORIDE 20 MEQ/15ML (10%) PO SOLN
20.0000 meq | ORAL | Status: AC
  Administered 2015-11-27 (×2): 20 meq
  Filled 2015-11-27 (×2): qty 15

## 2015-11-27 MED ORDER — LEVOTHYROXINE SODIUM 100 MCG IV SOLR
25.0000 ug | Freq: Every day | INTRAVENOUS | Status: DC
  Administered 2015-11-27 – 2015-12-02 (×6): 25 ug via INTRAVENOUS
  Filled 2015-11-27 (×6): qty 5

## 2015-11-27 MED ORDER — DEXTROSE 5 % IV SOLN
INTRAVENOUS | Status: DC
  Administered 2015-11-27: 16:00:00 via INTRAVENOUS

## 2015-11-27 MED ORDER — VITAL AF 1.2 CAL PO LIQD: 1000.0000 mL | ORAL | Status: DC

## 2015-11-27 MED ORDER — PANTOPRAZOLE SODIUM 40 MG IV SOLR
40.0000 mg | Freq: Every day | INTRAVENOUS | Status: DC
  Administered 2015-11-27 – 2015-11-29 (×3): 40 mg via INTRAVENOUS
  Filled 2015-11-27 (×3): qty 40

## 2015-11-27 NOTE — Progress Notes (Signed)
PULMONARY / CRITICAL CARE MEDICINE   Name: Megan [name] MRN: [id number] DOB: November 02, 1927    BRIEF PATIENT DESCRIPTION: 82 year-old with recent pan sensitive E coli UTI admitted with altered mental status, hypertensive crisis. CXR consistent with PNA. EEG showing status epilepticus. MRI showing stroke with dominant infarcts possibly embolic from an unknown source.   STUDIES:  CT Head 1/17: No acute finding. Extensive white matter disease  EEG 1/17: Focal status epilepticus  TTE 1/18: LV normal in size w/ EF 55-60% & grade 1 diastolic dysfunction. PASP . Moderate aortic regurg. No PFO. MRI 1/20: Acute nonhemorrhagic LEFT ACA territory infarction affecting much of the medial LEFT frontal lobe. Thrombosis of the LEFT anterior cerebral artery is suspected. Acute LEFT parieto-occipital boundary zone infarct between the LEFT MCA and PCA vascular territories. Multifocal subcentimeter infarction RIGHT parieto-occipital cortex and white matter, probably also representing boundary zone infarction, versus shower of emboli. Advanced atrophy and small vessel disease. CXR 1/22: Persistent LLL opacity.  MICROBIOLOGY: Blood Ctx x2 (1/17): Negative Urine Ctx (1/17): 1000 colonies, insignificant  ANTIBIOTICS: Zosyn 1/17- 1/20 Vancomycin 1/17 - 1/19  SIGNIFICANT EVENTS: 1/17 - intubated & started on labetalol for HTN 1/18 - Obtunded off sedation. Started on Keppra for Seizures by Neurology. 1/19 - Continues to be obtunded & bradycardic with HR in 40s-50s. 1/21 - MRI with bilateral CVA & family made aware.  1/23 - Eyes opening but not following commands.   LINES/TUBES: Right IJ CVL 1/17>>> OETT 1/17>>> OGT 1/17>>> Foley Catheter 1/17>>> PIV x1  SUBJECTIVE/OVERNIGHT/INTERVAL HX: No acute events overnight. Patient is opening eyes on command and tracking with her eyes. When asked to move her upper extremities, she moves her left hand. No movement of lower extremities.   REVIEW OF  SYSTEMS: Unobtainable as the patient is intubated.  VITAL SIGNS: Temp:  [85.6 F (29.8 C)-99.4 F (37.4 C)] 99.1 F (37.3 C) (01/25 0405) Pulse Rate:  [34-84] 54 (01/25 0600) Resp:  [0-20] 11 (01/25 0600) BP: (116-198)/(46-84) 122/59 mmHg (01/25 0600) SpO2:  [99 %-100 %] 100 % (01/25 0600) FiO2 (%):  [30 %] 30 % (01/25 0400) Weight:  [127 lb 3.3 oz (57.7 kg)] 127 lb 3.3 oz (57.7 kg) (01/25 0459) HEMODYNAMICS:   VENTILATOR SETTINGS: Vent Mode:  [-] PRVC FiO2 (%):  [30 %] 30 % Set Rate:  [11 bmp] 11 bmp Vt Set:  [480 mL] 480 mL PEEP:  [5 cmH20] 5 cmH20 Pressure Support:  [8 cmH20] 8 cmH20 Plateau Pressure:  [14 cmH20-18 cmH20] 18 cmH20 INTAKE / OUTPUT: Intake/Output      01/24 0701 - 01/25 0700 01/25 0701 - 01/26 0700   I.V. (mL/kg) 250 (4.3)    NG/GT 1215    IV Piggyback 220    Total Intake(mL/kg) 1685 (29.2)    Urine (mL/kg/hr) 2360 (1.7)    Total Output 2360     Net -675            PHYSICAL EXAMINATION: General: Remains on ventilator. No distress. Eyes closed. Neuro: Opens eyes intermittently & to voice. Doesn't follow commands. Spontaneous movement bilateral upper extremities. HEENT: Endotracheal tube in place. No scleral icterus. Cardiovascular: Regular rate. Mild edema. Unable to appreciate JVD. Lungs: Clear bilaterally to auscultation. Symmetric chest rise on ventilator. Abdomen: Soft. Nontender. Normal bowel sounds. Skin: Warm and Dry. No rash on exposed skin.  LABS:  CBC  Recent Labs Lab 11/25/15 0345 11/26/15 0353 11/27/15 0504  WBC 8.8 9.2 8.6  HGB 9.9* 9.5* 9.4*  HCT 30.1* 29.5* 29.7*  PLT 138* 150 168   Coag's No results for input(s): APTT, INR in the last 168 hours. BMET  Recent Labs Lab 11/25/15 0345 11/26/15 0353 11/27/15 0504  NA 143 141 140  K 3.8 4.0 3.4*  CL 106 107 97*  CO2 BUN CREATININE 0.45 0.42* 0.46  GLUCOSE 156* 147* 145*   Electrolytes  Recent Labs Lab 11/25/15 0345 11/26/15 0353  11/27/15 0504  CALCIUM 8.7* 8.6* 8.3*  MG 1.9 1.8 1.7  PHOS 2.2* 2.9 2.7   Sepsis Markers  Recent Labs Lab 11/20/15 1314 11/20/15 1315 11/21/15 0400 11/22/15 0509  LATICACIDVEN  --  1.5  --   --   PROCALCITON 0.27  --  0.25 0.16   ABG  Recent Labs Lab 11/22/15 0400 11/23/15 0330 11/24/15 0240  PHART 7.457* 7.480* 7.476*  PCO2ART 36.3 35.7 37.9  PO2ART 136* 116* 116*   Liver Enzymes  Recent Labs Lab 11/26/15 0353 11/27/15 0504  ALBUMIN 1.7* 1.7*   Cardiac Enzymes No results for input(s): TROPONINI, PROBNP in the last 168 hours. Glucose  Recent Labs Lab 11/26/15 0750 11/26/15 1140 11/26/15 1532 11/26/15 2010 11/26/15 2357 11/27/15 0404  GLUCAP 134* 155* 129* 142* 128* 124*    Imaging No results found.   ASSESSMENT/PLAN:  NEUROLOGIC A:  Acute Encephalopathy - MRI showing multiple acute strokes. Fentanyl 1 dose 1/17 & Propofol 1/17 -1/20. Focal Seizure - Seen on EEG.  P:   D/C Fentanyl ASA  VT daily Keppra 1gm IV q12hr Neurology has signed off   PULMONARY A: Acute Respiratory Failure - Secondary to CVA & inability to protect airway. Passed SBT AM 1/25. Increasing Secretions LLL Opacity   P: One-Way Extubation to Nasal Cannula today Incentive Spirometry  CARDIOVASCULAR A:  Hypertensive Emergency - Resolved.  Tachycardia - Resolved.  P:  Monitor in telemetry.  RENAL A:  Hypokalemia - Mild. Hypophosphatemia - Resolved. Hypomagnesemia - Resolved. Diminished urine output (resolved)   P:  Monitor electrolytes daily and replete as needed  Trending urine output with foley  Trending renal function daily with BUN/Creatinine Lasix 20 mg BID today   GASTROINTESTINAL A:  Severe Protein Calorie Malnutrition  H/O GERD  P:  Tolerating tube feedings but will D/C with extubation Switching Protonix to IV daily Swallow evaluation after extubation.  INFECTIOUS A:  Pneumonia - Procalcitonin improving. E  coli UTI - Ctx on 1/13 w/ repeat Ctx w/ insignificant growth.  P:  Finished course of antibiotics Plan to re-culture for fever  ENDOCRINE A:  DM Type 2 - BG controlled. A1c 6.4. H/O Hypothyroidism   P:  Accu-Checks q4hr Low Dose SSI per algorithm Switching to Synthroid  IV daily  HEMATOLOGIC A:  Anemia - Normocytic. No evidence of active bleeding.  P: Trending Hgb daily with CBC SCDs  Heparin [place] q8hr  FAMILY  - Updates:  1/18 - Spoke to patient's son Megan [name]) over the phone to update him about her status.  1/19 - son updated over the phone  1/20 - resident updated famiy 1/21 - poor pgornosis. Needs goals. No family at bedside. Will need neuro/stroke input before goals of care which we can aim for  1/22 - spoke to patient's son Megan [name] over the phone. Family had goals of care discussion with Mr. Tanja Port, NP this morning. Patient is now DNR, please refer to his note for further details.  1/24 - length discussion at bedside & via phone regarding patient's failed SBT this morning and  not following commands with dismal chance of returning to independent lifestyle. Patient's family wished to leave her intubated until a repeat family discussion on 11/27/15 at 2 PM. She remains DO NOT RESUSCITATE. 1/25 - Megan [name]) updated at bedside by Dr. Jamison Neighbor.   TODAY'S SUMMARY: 82 year old female with multiple CVA and altered mentation. Patient continuing to have no purposeful movements but does have spontaneous movements. Not following commands. Plan for one-way extubation today after discussion with her healthcare power of attorney given she passed her SBT this morning.  I have spent a total of 44 minutes of critical care time today caring for the patient, discussing the plan of care with the patient's son Megan [name] who is also her HCPOA, and reviewing the patient's electronic medical record.  Donna Christen Jamison Neighbor, M.D. [facility] Pager: [phone] After 3pm or if no response, call [phone]

## 2015-11-27 NOTE — Procedures (Signed)
Extubation Procedure Note  Patient Details:   Name: Megan [name] DOB: 1926/11/29 MRN: [id number]   Airway Documentation:     Evaluation  O2 sats: stable throughout Complications: No apparent complications Patient did tolerate procedure well. Bilateral Breath Sounds: Clear, Diminished Suctioning: Airway Yes   Patient extubated to 4lnc. Vital signs stable at this time. No complications. Patient tolerating well at this time. RN at bedside.RT will continue to monitor.  Ave Filter 11/27/2015, 2:42 PM

## 2015-11-27 NOTE — Therapy (Signed)
SLP Cancellation Note  Orders received for BSE, however, pt has just been extubated 10 minutes ago. Will defer BSE at this time to insure pt tolerates being off vent. ST will continue efforts to evaluate.  Tamesha Ellerbrock B. Kalel Harty, [facility], CCC-SLP [phone]

## 2015-11-27 NOTE — Progress Notes (Signed)
[facility] ADULT ICU REPLACEMENT PROTOCOL FOR AM LAB REPLACEMENT ONLY  The patient does apply for the [facility] Adult ICU Electrolyte Replacment Protocol based on the criteria listed below:   1. Is GFR >/= 40 ml/min? Yes.    Patient's GFR today is >60 2. Is urine output >/= 0.5 ml/kg/hr for the last 6 hours? Yes.   Patient's UOP is 1.16 ml/kg/hr 3. Is BUN < 60 mg/dL? Yes.    Patient's BUN today is 17 4. Abnormal electrolyte(s): Potassium 3.4 5. Ordered repletion with: Potassium per protocol    Franciso Dierks P 11/27/2015 6:02 AM

## 2015-11-27 NOTE — Progress Notes (Signed)
Nutrition Follow-up  DOCUMENTATION CODES:   Severe malnutrition in context of chronic illness, Underweight  INTERVENTION:    Decrease Vital AF 1.2 goal rate to 45 ml/h (1080 ml per day) to provide 1296 kcals, 81 gm protein, 876 ml free water daily.  NUTRITION DIAGNOSIS:   Malnutrition related to chronic illness as evidenced by severe depletion of body fat, severe depletion of muscle mass.  Ongoing  GOAL:   Patient will meet greater than or equal to 90% of their needs  Met  MONITOR:   Vent status, TF tolerance, Skin, Weight trends, Labs, I & O's   ASSESSMENT:   82 y.o. female history of hypertension, diastolic dysfunction, hypothyroidism, type 2 diabetes mellitus and dementia who was discharged yesterday following management of an Escherichia coli UTI, and readmitted today after being found unresponsive. Patient was intubated on arrival in the ED.   Another family meeting scheduled for this afternoon to decide on overall plan of care. Patient is currently receiving Vital AF 1.2 via OGT at 50 ml/h (1200 ml/day) to provide 1440 kcals, 90 gm protein, 972 ml free water daily.   Patient is currently intubated on ventilator support MV: 7.1 L/min Temp (24hrs), Avg:96.5 F (35.8 C), Min:85.6 F (29.8 C), Max:99.3 F (37.4 C)   Diet Order:   NPO  Skin:  Wound (see comment) (Stage II pressure ulcer on sacrum, DTI on L heel)  Last BM:  1/24  Height:   Ht Readings from Last 1 Encounters:  11/19/15 5' 8.11" (1.73 m)    Weight:   Wt Readings from Last 1 Encounters:  11/27/15 127 lb 3.3 oz (57.7 kg)    Ideal Body Weight:  63.8 kg  BMI:  Body mass index is 19.28 kg/(m^2).  Estimated Nutritional Needs:   Kcal:  1285  Protein:  80-100 gm  Fluid:  >/= 1.5 L  EDUCATION NEEDS:   No education needs identified at this time  Kimberly Lisle, RD, LDN, CNSC Pager [phone] After Hours Pager [phone]  

## 2015-11-28 LAB — RENAL FUNCTION PANEL
ANION GAP: 8 (ref 5–15)
Albumin: 1.8 g/dL — ABNORMAL LOW (ref 3.5–5.0)
BUN: 16 mg/dL (ref 6–20)
CALCIUM: 8.9 mg/dL (ref 8.9–10.3)
CO2: 32 mmol/L (ref 22–32)
Chloride: 98 mmol/L — ABNORMAL LOW (ref 101–111)
Creatinine, Ser: 0.46 mg/dL (ref 0.44–1.00)
GFR calc Af Amer: >60 mL/min (ref >60)
GFR calc non Af Amer: >60 mL/min (ref >60)
GLUCOSE: 114 mg/dL — AB (ref 65–99)
Phosphorus: 3 mg/dL (ref 2.5–4.6)
Potassium: 3.7 mmol/L (ref 3.5–5.1)
SODIUM: 138 mmol/L (ref 135–145)

## 2015-11-28 LAB — CBC
HEMATOCRIT: 31.3 % — AB (ref 36.0–46.0)
Hemoglobin: 9.9 g/dL — ABNORMAL LOW (ref 12.0–15.0)
MCH: 29.3 pg (ref 26.0–34.0)
MCHC: 31.6 g/dL (ref 30.0–36.0)
MCV: 92.6 fL (ref 78.0–100.0)
PLATELETS: 189 10*3/uL (ref 150–400)
RBC: 3.38 MIL/uL — ABNORMAL LOW (ref 3.87–5.11)
RDW: 15.3 % (ref 11.5–15.5)
WBC: 6.5 10*3/uL (ref 4.0–10.5)

## 2015-11-28 LAB — GLUCOSE, CAPILLARY
GLUCOSE-CAPILLARY: 117 mg/dL — AB (ref 65–99)
GLUCOSE-CAPILLARY: 81 mg/dL (ref 65–99)
GLUCOSE-CAPILLARY: 106 mg/dL — AB (ref 65–99)
GLUCOSE-CAPILLARY: 126 mg/dL — AB (ref 65–99)
Glucose-Capillary: 102 mg/dL — ABNORMAL HIGH (ref 65–99)
Glucose-Capillary: 110 mg/dL — ABNORMAL HIGH (ref 65–99)

## 2015-11-28 LAB — MAGNESIUM: Magnesium: 1.8 mg/dL (ref 1.7–2.4)

## 2015-11-28 MED ORDER — POTASSIUM CHLORIDE 10 MEQ/50ML IV SOLN
10.0000 meq | INTRAVENOUS | Status: AC
  Administered 2015-11-28 (×2): 10 meq via INTRAVENOUS
  Filled 2015-11-28 (×2): qty 50

## 2015-11-28 MED ORDER — ASPIRIN 81 MG PO CHEW: 324.0000 mg | CHEWABLE_TABLET | Freq: Every day | ORAL | Status: DC

## 2015-11-28 MED ORDER — DEXTROSE 5 % IV SOLN
INTRAVENOUS | Status: AC
  Administered 2015-11-28 – 2015-11-29 (×2): via INTRAVENOUS

## 2015-11-28 MED ORDER — ASPIRIN 300 MG RE SUPP
300.0000 mg | Freq: Every day | RECTAL | Status: DC
  Administered 2015-11-28 – 2015-12-01 (×4): 300 mg via RECTAL
  Filled 2015-11-28 (×6): qty 1

## 2015-11-28 MED ORDER — HYDRALAZINE HCL 20 MG/ML IJ SOLN
10.0000 mg | INTRAMUSCULAR | Status: DC | PRN
  Administered 2015-11-28 – 2015-12-03 (×6): 10 mg via INTRAVENOUS
  Filled 2015-11-28 (×7): qty 1

## 2015-11-28 MED ORDER — DEXTROSE 5 % IV SOLN: INTRAVENOUS | Status: DC

## 2015-11-28 NOTE — Evaluation (Signed)
Clinical/Bedside Swallow Evaluation Patient Details  Name: Megan [name] MRN: [id number] Date of Birth: 19-Apr-1927  Today's Date: 11/28/2015 Time: SLP Start Time (ACUTE ONLY): 0950 SLP Stop Time (ACUTE ONLY): 1005 SLP Time Calculation (min) (ACUTE ONLY): 15 min  Past Medical History:  Past Medical History  Diagnosis Date  . Hypertension   . Diastolic dysfunction, left ventricle by ECHO 2011 03/25/2012  . Hypothyroidism 03/25/2012  . High cholesterol   . Type II diabetes mellitus (HCC)   . H/O hiatal hernia   . Duodenal perforation (HCC) 03/23/12  . Arthritis     "in my back"  . GERD (gastroesophageal reflux disease)   . Dementia    Past Surgical History:  Past Surgical History  Procedure Laterality Date  . Bladder suspension    . Repair perforated ulcer  03/23/12  . Diagnostic laparoscopy  03/25/12    REPAIR OF PERFORATED ULCER with omental patch  . Abcess drainage  03/25/12     of abdominal & pelvic abscesses  . Laparoscopy  03/24/2012    Procedure: LAPAROSCOPY DIAGNOSTIC;  Surgeon: Ardeth Sportsman, MD;  Location: [facility] OR;  Service: General;  Laterality: N/A;  drainage of abdominal and pelvic abcesses  . Laparotomy  06/03/2012    Procedure: EXPLORATORY LAPAROTOMY;  Surgeon: Mariella Saa, MD;  Location: [facility] OR;  Service: General;  Laterality: N/A;   HPI:  81 year-old with recent pan sensitive E coli UTI admitted with altered mental status, hypertensive crisis. CXR consistent with PNA. Marland Kitchen MRI showing stroke with dominant infarcts possibly embolic from an unknown source.  LEFT ACA territory infarction affecting much of the medial LEFT frontal lobe. Thrombosis of the LEFT anterior cerebral artery is suspected. Acute LEFT parieto-occipital boundary zone infarct between the LEFT MCA and PCA vascular territories. Multifocal subcentimeter infarction RIGHT parieto-occipital cortex and white matter, probably also representing boundary zone infarction, versus shower of emboli. Advanced atrophy  and small vessel disease. Pt intubated from 1/17 -1/25.    Assessment / Plan / Recommendation Clinical Impression  Pt demonstrates swallow function impaired by prolonged intubation and AMS, possible neuromuscular deficits from CVA. Pt is arousable, but does not follow commands and required max tactile cues to recognize bolus presentation with spoon. Pt does masticate and transit bolus, though swallow response is weak and followed by delayed congested, weak coughing. Given these deficits, pt is at high risk of aspiration with all consistencies and mentation is not sufficient for PO intake in any case. Recommend pt remain NPO. Will follow for trials tomorrow.     Aspiration Risk  Severe aspiration risk    Diet Recommendation NPO        Other  Recommendations Oral Care Recommendations: Oral care QID Other Recommendations: Have oral suction available   Follow up Recommendations  Skilled Nursing facility;24 hour supervision/assistance    Frequency and Duration            Prognosis Prognosis for Safe Diet Advancement: Guarded      Swallow Study   General HPI: 82 year-old with recent pan sensitive E coli UTI admitted with altered mental status, hypertensive crisis. CXR consistent with PNA. Marland Kitchen MRI showing stroke with dominant infarcts possibly embolic from an unknown source.  LEFT ACA territory infarction affecting much of the medial LEFT frontal lobe. Thrombosis of the LEFT anterior cerebral artery is suspected. Acute LEFT parieto-occipital boundary zone infarct between the LEFT MCA and PCA vascular territories. Multifocal subcentimeter infarction RIGHT parieto-occipital cortex and white matter, probably also representing boundary  zone infarction, versus shower of emboli. Advanced atrophy and small vessel disease. Pt intubated from 1/17 -1/25.  Type of Study: Bedside Swallow Evaluation Previous Swallow Assessment: BSE 2013 - Dys 3/thin Diet Prior to this Study: NPO Temperature Spikes Noted:  No Respiratory Status: Nasal cannula History of Recent Intubation: Yes Length of Intubations (days): 9 days Date extubated: 11/27/15 Behavior/Cognition: Lethargic/Drowsy;Doesn't follow directions;Confused Oral Cavity Assessment: Within Functional Limits Oral Care Completed by SLP: No Oral Cavity - Dentition: Edentulous Vision: Impaired for self-feeding Self-Feeding Abilities: Total assist Patient Positioning: Postural control interferes with function Baseline Vocal Quality: Low vocal intensity Volitional Cough: Cognitively unable to elicit (reflexive cough weak and congested) Volitional Swallow: Unable to elicit    Oral/Motor/Sensory Function Overall Oral Motor/Sensory Function:  (difficult to assess, pt leaning left, cant reposition )   Ice Chips Ice chips: Impaired Presentation: Spoon Oral Phase Impairments: Poor awareness of bolus Pharyngeal Phase Impairments: Cough - Delayed   Thin Liquid Thin Liquid: Not tested    Nectar Thick Nectar Thick Liquid: Not tested   Honey Thick Honey Thick Liquid: Not tested   Puree Puree: Impaired Presentation: Spoon Oral Phase Impairments: Poor awareness of bolus Pharyngeal Phase Impairments: Cough - Delayed   Solid   GO   Solid: Not tested       Harlon Ditty, MA CCC-SLP [phone]  Megan [name], Riley Nearing 11/28/2015,10:59 AM

## 2015-11-28 NOTE — Evaluation (Signed)
Speech Language Pathology Evaluation Patient Details Name: Megan [name] MRN: [id number] DOB: 05-31-1927 Today's Date: 11/28/2015 Time: 0950-1010 SLP Time Calculation (min) (ACUTE ONLY): 20 min  Problem List:  Patient Active Problem List   Diagnosis Date Noted  . Cerebral thrombosis with cerebral infarction 11/23/2015  . Acute respiratory failure (HCC) 11/21/2015  . Endotracheally intubated   . Pressure ulcer 11/20/2015  . Acute encephalopathy 11/19/2015  . Difficult intravenous access   . Status epilepticus (HCC)   . Protein-calorie malnutrition, severe 11/16/2015  . Hypokalemia   . Dementia with behavioral disturbance 04/29/2015  . Agitation requiring sedation protocol 04/29/2015  . Hypercalcemia 11/21/2014  . Orthostatic hypotension   . At high risk for falls 06/25/2014  . Atherosclerosis of abdominal aorta (HCC) 06/25/2014  . Essential hypertension 12/12/2013  . Vitamin D deficiency 12/12/2013  . GERD (gastroesophageal reflux disease)   . UTI (lower urinary tract infection) 08/25/2013  . SDAT 02/27/2013  . SBO (small bowel obstruction) (HCC) 05/28/2012  . T2 NIDDM w/Nephropathy 03/25/2012  . Hypothyroidism 03/25/2012   Past Medical History:  Past Medical History  Diagnosis Date  . Hypertension   . Diastolic dysfunction, left ventricle by ECHO 2011 03/25/2012  . Hypothyroidism 03/25/2012  . High cholesterol   . Type II diabetes mellitus (HCC)   . H/O hiatal hernia   . Duodenal perforation (HCC) 03/23/12  . Arthritis     "in my back"  . GERD (gastroesophageal reflux disease)   . Dementia    Past Surgical History:  Past Surgical History  Procedure Laterality Date  . Bladder suspension    . Repair perforated ulcer  03/23/12  . Diagnostic laparoscopy  03/25/12    REPAIR OF PERFORATED ULCER with omental patch  . Abcess drainage  03/25/12     of abdominal & pelvic abscesses  . Laparoscopy  03/24/2012    Procedure: LAPAROSCOPY DIAGNOSTIC;  Surgeon: Ardeth Sportsman,  MD;  Location: [facility] OR;  Service: General;  Laterality: N/A;  drainage of abdominal and pelvic abcesses  . Laparotomy  06/03/2012    Procedure: EXPLORATORY LAPAROTOMY;  Surgeon: Mariella Saa, MD;  Location: [facility] OR;  Service: General;  Laterality: N/A;   HPI:  82 year-old with recent pan sensitive E coli UTI admitted with altered mental status, hypertensive crisis. CXR consistent with PNA. Marland Kitchen MRI showing stroke with dominant infarcts possibly embolic from an unknown source.  LEFT ACA territory infarction affecting much of the medial LEFT frontal lobe. Thrombosis of the LEFT anterior cerebral artery is suspected. Acute LEFT parieto-occipital boundary zone infarct between the LEFT MCA and PCA vascular territories. Multifocal subcentimeter infarction RIGHT parieto-occipital cortex and white matter, probably also representing boundary zone infarction, versus shower of emboli. Advanced atrophy and small vessel disease. Pt intubated from 1/17 -1/25.    Assessment / Plan / Recommendation Clinical Impression  Cognitive linguistic function impaired, difficult to determine if aphasia is present given concomitant impairment with arousal and attention. Briefly focused attention achieved with max verbal and tactile cues. Pt able to say Megan [name] name and then repeats Megan [name] name 2-3x (echolalia?). Could not elicit further repetition of words or automatic language, no response to basic Y/N question, pt did not follow commands with max cues. Will follow to facilitate functional communication. Recommend SNF at dc.     SLP Assessment  Patient needs continued Speech Lanaguage Pathology Services    Follow Up Recommendations  Skilled Nursing facility;24 hour supervision/assistance    Frequency and Duration min 2x/week  2 weeks  SLP Evaluation Prior Functioning  Cognitive/Linguistic Baseline: Information not available   Cognition  Overall Cognitive Status: History of cognitive impairments - at  baseline Arousal/Alertness: Lethargic Orientation Level: Oriented to person Attention: Focused Focused Attention: Impaired Focused Attention Impairment: Verbal basic;Functional basic    Comprehension  Auditory Comprehension Overall Auditory Comprehension: Impaired Yes/No Questions: Impaired Basic Biographical Questions: 0-25% accurate Commands: Impaired One Step Basic Commands: 0-24% accurate Conversation: Other (comment) Interfering Components: Attention EffectiveTechniques: Repetition    Expression Verbal Expression Overall Verbal Expression: Appears within functional limits for tasks assessed Initiation: No impairment Automatic Speech: Name Level of Generative/Spontaneous Verbalization: Word Repetition: Impaired Level of Impairment: Word level Naming: Impairment Confrontation: Impaired Verbal Errors: Echolalia Interfering Components: Attention   Oral / Motor  Oral Motor/Sensory Function Overall Oral Motor/Sensory Function:  (difficult to assess pt does not follow commands) Motor Speech Overall Motor Speech: Appears within functional limits for tasks assessed (limited verbalizations, difficult to assess. )   GO                   Harlon Ditty, MA CCC-SLP [phone]  Charity Tessier, Riley Nearing 11/28/2015, 12:19 PM

## 2015-11-28 NOTE — Progress Notes (Signed)
BRIEF PATIENT DESCRIPTION: 82 year-old with recent pan sensitive E coli UTI admitted with altered mental status, hypertensive crisis. CXR consistent with PNA. EEG showing status epilepticus. MRI showing stroke with dominant infarcts possibly embolic from an unknown source.   SIGNIFICANT EVENTS: 1/17 - intubated & started on labetalol for HTN 1/18 - Obtunded off sedation. Started on Keppra for Seizures by Neurology. 1/19 - Continues to be obtunded & bradycardic with HR in 40s-50s. 1/21 - MRI with bilateral CVA & family made aware.  1/23 - Eyes opening but not following commands.  1/25 - One-Way Extubation to Nasal Cannula. Tube feeds stopped.   STUDIES:  CT Head 1/17: No acute finding. Extensive white matter disease  EEG 1/17: Focal status epilepticus  TTE 1/18: LV normal in size w/ EF 55-60% & grade 1 diastolic dysfunction. PASP . Moderate aortic regurg. No PFO. MRI 1/20: Acute nonhemorrhagic LEFT ACA territory infarction affecting much of the medial LEFT frontal lobe. Thrombosis of the LEFT anterior cerebral artery is suspected. Acute LEFT parieto-occipital boundary zone infarct between the LEFT MCA and PCA vascular territories. Multifocal subcentimeter infarction RIGHT parieto-occipital cortex and white matter, probably also representing boundary zone infarction, versus shower of emboli. Advanced atrophy and small vessel disease. CXR 1/22: Persistent LLL opacity.  MICROBIOLOGY: Blood Ctx x2 (1/17): Negative Urine Ctx (1/17): 1000 colonies, insignificant  ANTIBIOTICS: Zosyn 1/17- 1/20 Vancomycin 1/17 - 1/19  LINES/TUBES: Right IJ CVL 1/17>>> OETT 1/17>>> OGT 1/17>>> Foley Catheter 1/17>>> PIV x1  Subjective: No acute overnight events. Somnolent but opens eyes on command.. She is occasionally answering questions by saying "yes" but is slow to respond. No movement of extremities on verbal command. O2 saturation 100% on 3L via [place]. Of note: Patient is do not  resuscitate and do not re-intubate. She was extubated yesterday and tube feeds were stopped. She failed her swallow evaluation this morning. Dr. Jamison Neighbor spoke to patient's son and HCPOA Mr. Karma Lew over the phone. Mr. Fredrich Birks does NOT want [place] to re-initiate tube feeds for the patient.   REVIEW OF SYSTEMS:  Unable to obtain given patient's aphasia.  Objective: Vital signs in last 24 hours: Filed Vitals:   11/28/15 0600 11/28/15 0804 11/28/15 0805 11/28/15 0900  BP: 147/62  192/74 192/75  Pulse: 54  75 71  Temp:  97 F (36.1 C)    TempSrc:  Oral    Resp: Height:      Weight:      SpO2: 99%  98% 100%   Weight change: 0.9 kg (1 lb 15.7 oz)  Intake/Output Summary (Last 24 hours) at 11/28/15 1047 Last data filed at 11/28/15 0844  Gross per 24 hour  Intake 1284.33 ml  Output   2515 ml  Net -1230.67 ml   PHYSICAL EXAMINATION: General: No acute distress.  Neuro:Somnolent but opens eyes on command and tracks. She is occasionally answering questions by saying "yes" but is slow to respond. No movement of extremities on verbal command.  HEENT: Normocephalic and atraumatic.  Cardiovascular: Regular rate. Mild edema. Unable to appreciate JVD. Lungs: Clear bilaterally to auscultation. Satting 100% on 3L O2 via [place]. Abdomen: Soft. Nontender. Normal bowel sounds. Skin: Warm and Dry. No rash on exposed skin.  Lab Results: Basic Metabolic Panel:  Recent Labs Lab 11/27/15 0504 11/28/15 0430 11/28/15 0441  NA 140  --  138  K 3.4*  --  3.7  CL 97*  --  98*  CO2 30  --  32  GLUCOSE 145*  --  114*  BUN 17  --  16  CREATININE 0.46  --  0.46  CALCIUM 8.3*  --  8.9  MG 1.7 1.8  --   PHOS 2.7  --  3.0   Liver Function Tests:  Recent Labs Lab 11/27/15 0504 11/28/15 0441  ALBUMIN 1.7* 1.8*   CBC:  Recent Labs Lab 11/27/15 0504 11/28/15 0430  WBC 8.6 6.5  HGB 9.4* 9.9*  HCT 29.7* 31.3*  MCV 92.0 92.6  PLT 168 189   CBG:  Recent Labs Lab  11/27/15 1136 11/27/15 1523 11/27/15 2014 11/27/15 2333 11/28/15 0339 11/28/15 0801  GLUCAP 160* 76 111* 110* 117* 81   Hemoglobin A1C:  Recent Labs Lab 11/24/15 0301  HGBA1C 6.4*   Fasting Lipid Panel:  Recent Labs Lab 11/24/15 0301  CHOL 109  HDL 28*  LDLCALC 70  TRIG 55  CHOLHDL 3.9   Urine Drug Screen: Drugs of Abuse     Component Value Date/Time   LABOPIA NONE DETECTED 11/15/2015 2210   COCAINSCRNUR NONE DETECTED 11/15/2015 2210   LABBENZ NONE DETECTED 11/15/2015 2210   AMPHETMU NONE DETECTED 11/15/2015 2210   THCU NONE DETECTED 11/15/2015 2210   LABBARB NONE DETECTED 11/15/2015 2210    Micro Results: Recent Results (from the past 240 hour(s))  Blood culture (routine x 2)     Status: None   Collection Time: 11/19/15  9:30 AM  Result Value Ref Range Status   Specimen Description BLOOD RIGHT ANTECUBITAL  Final   Special Requests BOTTLES DRAWN AEROBIC AND ANAEROBIC  Final   Culture NO GROWTH 5 DAYS  Final   Report Status 11/24/2015 FINAL  Final  Urine culture     Status: None   Collection Time: 11/19/15 10:26 AM  Result Value Ref Range Status   Specimen Description URINE, CATHETERIZED  Final   Special Requests NONE  Final   Culture 1,000 COLONIES/mL INSIGNIFICANT GROWTH  Final   Report Status 11/20/2015 FINAL  Final  Blood culture (routine x 2)     Status: None   Collection Time: 11/19/15 12:00 PM  Result Value Ref Range Status   Specimen Description BLOOD RIGHT ANTECUBITAL  Final   Special Requests BOTTLES DRAWN AEROBIC AND ANAEROBIC  Final   Culture NO GROWTH 5 DAYS  Final   Report Status 11/24/2015 FINAL  Final  MRSA PCR Screening     Status: None   Collection Time: 11/19/15  6:52 PM  Result Value Ref Range Status   MRSA by PCR NEGATIVE NEGATIVE Final    Comment:        The GeneXpert MRSA Assay (FDA approved for NASAL specimens only), is one component of a comprehensive MRSA colonization surveillance program. It is not intended to  diagnose MRSA infection nor to guide or monitor treatment for MRSA infections.    Medications: I have reviewed the patient's current medications. Scheduled Meds: . antiseptic oral rinse  7 mL Mouth Rinse QID  . aspirin  324 mg Per Tube Daily  . chlorhexidine gluconate  15 mL Mouth Rinse BID  . heparin subcutaneous  5,000 Units Subcutaneous 3 times per day  . insulin aspart  0-9 Units Subcutaneous 6 times per day  . latanoprost  1 drop Both Eyes QHS  . levETIRAcetam  1,000 mg Intravenous Q12H  . levothyroxine  25 mcg Intravenous Daily  . pantoprazole (PROTONIX) IV  40 mg Intravenous QHS   Continuous Infusions: . dextrose 50 mL/hr at 11/28/15 1610  PRN Meds:.sodium chloride, hydrALAZINE, sodium chloride Assessment/Plan: Active Problems:   Acute encephalopathy   Status epilepticus (HCC)   Pressure ulcer   Endotracheally intubated   Acute respiratory failure (HCC)   Cerebral thrombosis with cerebral infarction  Acute Encephalopathy - MRI 1/20 showing multiple acute strokes. Fentanyl 1 dose 1/17 & Propofol 1/17 -1/20. At present, she opens eyes on command. She is occasionally answering questions by saying "yes" but is slow to respond. No movement of extremities on verbal command.  - Continue Aspirin 300 mg suppository    - PT eval pending - OT eval pending  - Continue to monitor.   Status Epiepticus - Seen on EEG 1/17.  - Keppra 1gm IV q12hr - Neurology has signed off  Acute Respiratory Failure - Secondary to CVA & inability to protect airway. Passed SBT on 1/25 and was extubated the same day. - Continue Incentive Spirometry - Continue oxygen supplementation to keep O2 saturation >92%  HCAP - Treated with Vancomycin (1/17-1/19) and Zosyn (1/17-1/20). Blood cx (1/17) negative. - Finished course of antibiotics.  - Plan to re-culture for fever  E coli UTI - Ctx on 1/13 w/ E. Coli. Repeat Ctx on 1/17 w/ insignificant growth. - Finished course of antibiotics.  - Plan to  re-culture for fever  DM Type 2 - Blood glucose controlled. A1c 6.4.  -Accu-Checks q4hr - Low Dose SSI per algorithm  Hypothyroidism - Continue Synthroid  IV daily  Normocytic anemia - no evidence of active bleeding. Hgb stable at 9.9. - Trending Hgb daily with CBC  Severe Protein Calorie Malnutrition - discontinued tube feeds on 1/25 after extubation. Patient failed her swallow eval this morning. Her son/ HCPOA does NOT wish to re-initiate tube feeds for the patient.  - Continue Dextrose 5% @ 50cc/hr to prevent hypoglycemia.    Hx of GERD - Continue Protonix IV daily  Hypertensive Emergency (Resolved) - IV Hydralazine 10 mg q4 prn SBP >165 or DBP >105 - Monitor in telemetry.  Tachycardia (Resolved) - Monitor in telemetry.  Electrolyte abnormalities (Resolved) - included hypokalemia, hypophosphatemia, and hypomagenesemia.  - Monitor electrolytes daily and replete as needed   Diminished urine output (Resolved) - Trending urine output with foley  - Trending renal function daily with BUN/Creatinine  DVT prophylaxis - SCDs  - Heparin [place] q8hr  Diet  - NPO - Pending SLP eval   Code status  DNR and DNI  Dispo: Disposition is deferred at this time, awaiting improvement of current medical problems.  Anticipated discharge in approximately 5-7 day(s).   The patient does have a current PCP Lucky Cowboy, MD) and does need an [facility] hospital follow-up appointment after discharge.  The patient does have transportation limitations that hinder transportation to clinic appointments.  .Services Needed at time of discharge: Y = Yes, Blank = No PT:   OT:   RN:   Equipment:   Other:     LOS: 9 days   John Giovanni, MD 11/28/2015, 10:47 AM   PCCM Attending Note: Patient seen and examined with resident physician. Please refer to her progress note which I reviewed in detail. 82 year old female with baseline dementia and now with bilateral strokes with significant  debilitation. Patient has aphasia. There were no acute events overnight. Discussed patient's current clinical state with her healthcare power of attorney [name]).  BP 169/76 mmHg  Pulse 68  Temp(Src) 97 F (36.1 C) (Oral)  Resp 18  Ht 5' 8.11" (1.73 m)  Wt 129 lb 3 oz (58.6 kg)  BMI 19.58 kg/m2  SpO2 100% Gen.: Patient lying in bed. Eyes closed. No distress. Integument: Warm and dry. No rash on exposed skin. Pulmonary: Slightly decreased breath sounds bilateral lung bases. Normal work of breathing on nasal cannula oxygen. Neurological: Patient does not answer questions appropriately on my exam. She does not follow commands appropriately either.  CBC Latest Ref Rng 11/28/2015 11/27/2015 11/26/2015  WBC 4.0 - 10.5 K/uL 6.5 8.6 9.2  Hemoglobin 12.0 - 15.0 g/dL 1.6(X) 0.9(U) 0.4(V)  Hematocrit 36.0 - 46.0 % 31.3(L) 29.7(L) 29.5(L)  Platelets 150 - 400 K/uL 189 168 150    BMP Latest Ref Rng 11/28/2015 11/27/2015 11/26/2015  Glucose 65 - 99 mg/dL 409(W) 119(J) 478(G)  BUN 6 - 20 mg/dL Creatinine 0.44 - 1.00 mg/dL 9.56 2.13 0.86(V)  Sodium 135 - 145 mmol/L 138 140 141  Potassium 3.5 - 5.1 mmol/L 3.7 3.4(L) 4.0  Chloride 101 - 111 mmol/L 98(L) 97(L) 107  CO2 22 - 32 mmol/L 32 30 29  Calcium 8.9 - 10.3 mg/dL 8.9 7.8(I) 6.9(G)    Assessment/Plan: 1. Bilateral strokes: Neurology has evaluated and signed off. Continuing the aspirin 300 mg per rectum while she is nothing by mouth. PT, OT, and speech therapy consulted. 2. Status epilepticus: Seizure noted on EEG 1/17. Started on Keppra 1 g IV every 12 hours. Likely secondary to CVA. 3. Acute hypoxic respiratory failure: Secondary to inability to protect airway. Patient successfully expanded 1/25. Incentive spirometry ordered. 4. Escherichia coli UTI: Initial culture 1/13. Repeat culture 1/17 without significant growth. Patient completed a course of antibiotics. 5. HCAP: Treated empirically with vancomycin & Zosyn. Cultures  negative. 6. Hypertensive emergency: Resolved. Continuing hydralazine IV every 4 hours when necessary. 7. Diabetes mellitus type 2/hypoglycemia: Continuing Accu-Cheks every 4 hours with low-dose sliding scale per algorithm. Continuing dextrose IV fluid for hypoglycemia. 8. Hypothyroidism: Continuing patient on Synthroid 25 g IV daily. 9. Severe protein-calorie malnutrition: Tube feeds discontinued after extubation 1/25. Discussed patient's failed bedside swallow evaluation. Healthcare power of attorney wishes to continue to monitor progress with speech therapy and feels his mother would not wish to have a feeding tube placed at this time either nasally or PEG. 10. History of GERD: Continuing Protonix IV daily. 11. Prophylaxis: SCDs & heparin subcutaneous every 8 hours as well as Protonix IV daily. 12. Diet: Nothing by mouth pending improvement in speech evaluation. 13. CODE STATUS: Wishes DO NOT RESUSCITATE/DO NOT INTUBATE. Healthcare power of attorney is her son. She also has a living will. 14. Disposition: Depending upon patient's clinical progress patient may need disposition to hospice. There are family members/children who I feel will likely have conflicted issues with this despite her previously expressed wishes. Healthcare power of attorney should be updated on a daily basis. Hospitalist service to assume care and PCCM will sign off 1/27.  Donna Christen Jamison Neighbor, M.D. [facility] Pager:  [phone] After 3pm or if no response, call [phone]

## 2015-11-28 NOTE — Progress Notes (Deleted)
PULMONARY / CRITICAL CARE MEDICINE   Name: Megan [name] MRN: [id number] DOB: Sep 04, 1927    BRIEF PATIENT DESCRIPTION: 82 year-old with recent pan sensitive E coli UTI admitted with altered mental status, hypertensive crisis. CXR consistent with PNA. EEG showing status epilepticus. MRI showing stroke with dominant infarcts possibly embolic from an unknown source.   STUDIES:  CT Head 1/17: No acute finding. Extensive white matter disease  EEG 1/17: Focal status epilepticus  TTE 1/18: LV normal in size w/ EF 55-60% & grade 1 diastolic dysfunction. PASP . Moderate aortic regurg. No PFO. MRI 1/20: Acute nonhemorrhagic LEFT ACA territory infarction affecting much of the medial LEFT frontal lobe. Thrombosis of the LEFT anterior cerebral artery is suspected. Acute LEFT parieto-occipital boundary zone infarct between the LEFT MCA and PCA vascular territories. Multifocal subcentimeter infarction RIGHT parieto-occipital cortex and white matter, probably also representing boundary zone infarction, versus shower of emboli. Advanced atrophy and small vessel disease. CXR 1/22: Persistent LLL opacity.  MICROBIOLOGY: Blood Ctx x2 (1/17): Negative Urine Ctx (1/17): 1000 colonies, insignificant  ANTIBIOTICS: Zosyn 1/17- 1/20 Vancomycin 1/17 - 1/19  SIGNIFICANT EVENTS: 1/17 - intubated & started on labetalol for HTN 1/18 - Obtunded off sedation. Started on Keppra for Seizures by Neurology. 1/19 - Continues to be obtunded & bradycardic with HR in 40s-50s. 1/21 - MRI with bilateral CVA & family made aware.  1/23 - Eyes opening but not following commands.  1/25 - One-Way Extubation to Nasal Cannula. Tube feeds stopped.   LINES/TUBES: Right IJ CVL 1/17>>> OETT 1/17>>> OGT 1/17>>> Foley Catheter 1/17>>> PIV x1  SUBJECTIVE/OVERNIGHT/INTERVAL HX: No acute overnight events. Somnolent but opens eyes on command.. She is occasionally answering questions by saying "yes" but is slow to respond.  Tracking with her eyes. Squeezed my fingers with her left hand on command. No movement of the right upper extremity and bilateral lower extremities.   REVIEW OF SYSTEMS: Unobtainable as the patient is slow to respond.   VITAL SIGNS: Temp:  [97.4 F (36.3 C)-98.8 F (37.1 C)] 97.4 F (36.3 C) (01/26 0400) Pulse Rate:  [54-96] 54 (01/26 0600) Resp:  [11-34] 16 (01/26 0600) BP: (114-169)/(49-100) 147/62 mmHg (01/26 0600) SpO2:  [98 %-100 %] 99 % (01/26 0600) FiO2 (%):  [30 %] 30 % (01/25 1212) Weight:  [58.6 kg (129 lb 3 oz)] 58.6 kg (129 lb 3 oz) (01/26 0351) HEMODYNAMICS:   VENTILATOR SETTINGS: Vent Mode:  [-] PRVC FiO2 (%):  [30 %] 30 % Set Rate:  [11 bmp] 11 bmp Vt Set:  [480 mL] 480 mL PEEP:  [5 cmH20] 5 cmH20 Pressure Support:  [10 cmH20] 10 cmH20 Plateau Pressure:  [25 cmH20] 25 cmH20 INTAKE / OUTPUT: Intake/Output      01/25 0701 - 01/26 0700   I.V. (mL/kg) 758.3 (12.9)   NG/GT 366   IV Piggyback 320   Total Intake(mL/kg) 1444.3 (24.6)   Urine (mL/kg/hr) 2650 (1.9)   Total Output 2650   Net -1205.7         PHYSICAL EXAMINATION: General: No acute distress.   Neuro:Somnolent but opens eyes on command. She is occasionally answering questions by saying "yes" but is slow to respond. Tracking with her eyes. Squeezed my fingers with her left hand on command. No movement of the right upper extremity and bilateral lower extremities.  HEENT: Normocephalic and atraumatic.  Cardiovascular: Regular rate. Mild edema. Unable to appreciate JVD. Lungs: Clear bilaterally to auscultation. Satting 100% on 3L via [place]. Abdomen: Soft. Nontender. Normal bowel sounds.  Skin: Warm and Dry. No rash on exposed skin.  LABS:  CBC  Recent Labs Lab 11/26/15 0353 11/27/15 0504 11/28/15 0430  WBC 9.2 8.6 6.5  HGB 9.5* 9.4* 9.9*  HCT 29.5* 29.7* 31.3*  PLT 150 168 189   Coag's No results for input(s): APTT, INR in the last 168 hours. BMET  Recent Labs Lab 11/26/15 0353  11/27/15 0504 11/28/15 0441  NA 141 140 138  K 4.0 3.4* 3.7  CL 107 97* 98*  CO2 29 30 32  BUN CREATININE 0.42* 0.46 0.46  GLUCOSE 147* 145* 114*   Electrolytes  Recent Labs Lab 11/26/15 0353 11/27/15 0504 11/28/15 0430 11/28/15 0441  CALCIUM 8.6* 8.3*  --  8.9  MG 1.8 1.7 1.8  --   PHOS 2.9 2.7  --  3.0   Sepsis Markers  Recent Labs Lab 11/22/15 0509  PROCALCITON 0.16   ABG  Recent Labs Lab 11/22/15 0400 11/23/15 0330 11/24/15 0240  PHART 7.457* 7.480* 7.476*  PCO2ART 36.3 35.7 37.9  PO2ART 136* 116* 116*   Liver Enzymes  Recent Labs Lab 11/26/15 0353 11/27/15 0504 11/28/15 0441  ALBUMIN 1.7* 1.7* 1.8*   Cardiac Enzymes No results for input(s): TROPONINI, PROBNP in the last 168 hours. Glucose  Recent Labs Lab 11/27/15 0805 11/27/15 1136 11/27/15 1523 11/27/15 2014 11/27/15 2333 11/28/15 0339  GLUCAP 131* 160* 76 111* 110* 117*    Imaging No results found.   ASSESSMENT/PLAN:  NEUROLOGIC A:  Acute Encephalopathy - MRI showing multiple acute strokes. Fentanyl 1 dose 1/17 & Propofol 1/17 -1/20. Focal Seizure - Seen on EEG.  P:   ASA  VT daily Keppra 1gm IV q12hr Neurology has signed off  PULMONARY A: Acute Respiratory Failure - Secondary to CVA & inability to protect airway. Passed SBT AM 1/25. Increasing Secretions LLL Opacity   P: Incentive Spirometry Continue oxygen supplementation to keep O2 saturation >92%  CARDIOVASCULAR A:  Hypertensive Emergency - Resolved.  Tachycardia - Resolved.  P:  Monitor in telemetry.  RENAL A:  Hypokalemia - Resolved. Hypophosphatemia - Resolved. Hypomagnesemia - Resolved. Diminished urine output (resolved)   P:  Monitor electrolytes daily and replete as needed  Trending urine output with foley  Trending renal function daily with BUN/Creatinine  GASTROINTESTINAL A:  Severe Protein Calorie Malnutrition  H/O GERD  P:  Continue Protonix IV  daily Continue Dextrose 5% @ 50cc/hr to prevent hypoglycemia.  Swallow evaluation pending.   INFECTIOUS A:  Pneumonia - Procalcitonin improving. E coli UTI - Ctx on 1/13 w/ repeat Ctx w/ insignificant growth.  P:  Finished course of antibiotics Plan to re-culture for fever  ENDOCRINE A:  DM Type 2 - BG controlled. A1c 6.4. H/O Hypothyroidism   P:  Accu-Checks q4hr Low Dose SSI per algorithm Switching to Synthroid  IV daily  HEMATOLOGIC A:  Anemia - Normocytic. No evidence of active bleeding.  P: Trending Hgb daily with CBC SCDs  Heparin [place] q8hr  FAMILY  - Updates:  1/18 - Spoke to patient's son Megan [name]) over the phone to update him about her status.  1/19 - son updated over the phone  1/20 - resident updated famiy 1/21 - poor pgornosis. Needs goals. No family at bedside. Will need neuro/stroke input before goals of care which we can aim for  1/22 - spoke to patient's son Megan [name] over the phone. Family had goals of care discussion with Mr. Tanja Port, NP this morning. Patient is now DNR, please refer  to his note for further details.  1/24 - length discussion at bedside & via phone regarding patient's failed SBT this morning and not following commands with dismal chance of returning to independent lifestyle. Patient's family wished to leave her intubated until a repeat family discussion on 11/27/15 at 2 PM. She remains DO NOT RESUSCITATE. 1/25 - [name] [facility]) updated at bedside by Dr. Jamison Neighbor.   TODAY'S SUMMARY:

## 2015-11-28 NOTE — Progress Notes (Signed)
[facility] ADULT ICU REPLACEMENT PROTOCOL FOR AM LAB REPLACEMENT ONLY  The patient does apply for the [facility] Adult ICU Electrolyte Replacment Protocol based on the criteria listed below:   1. Is GFR >/= 40 ml/min? Yes.    Patient's GFR today is 47 2. Is urine output >/= 0.5 ml/kg/hr for the last 6 hours? Yes.   Patient's UOP is 1.8 ml/kg/hr 3. Is BUN < 60 mg/dL? Yes.    Patient's BUN today is 30 4. Abnormal electrolyte(s):Potassium 3.4 5. Ordered repletion with: Potassium per protocol    Zalen Sequeira P 11/28/2015 5:20 AM

## 2015-11-29 ENCOUNTER — Inpatient Hospital Stay (HOSPITAL_COMMUNITY): Payer: Medicare Other

## 2015-11-29 DIAGNOSIS — I6389 Other cerebral infarction: Secondary | ICD-10-CM | POA: Diagnosis present

## 2015-11-29 DIAGNOSIS — J189 Pneumonia, unspecified organism: Secondary | ICD-10-CM | POA: Diagnosis present

## 2015-11-29 DIAGNOSIS — I10 Essential (primary) hypertension: Secondary | ICD-10-CM

## 2015-11-29 DIAGNOSIS — N390 Urinary tract infection, site not specified: Secondary | ICD-10-CM

## 2015-11-29 DIAGNOSIS — E1121 Type 2 diabetes mellitus with diabetic nephropathy: Secondary | ICD-10-CM

## 2015-11-29 DIAGNOSIS — I638 Other cerebral infarction: Secondary | ICD-10-CM

## 2015-11-29 DIAGNOSIS — E43 Unspecified severe protein-calorie malnutrition: Secondary | ICD-10-CM

## 2015-11-29 LAB — CBC
HEMATOCRIT: 34.3 % — AB (ref 36.0–46.0)
HEMOGLOBIN: 11.4 g/dL — AB (ref 12.0–15.0)
MCH: 30.2 pg (ref 26.0–34.0)
MCHC: 33.2 g/dL (ref 30.0–36.0)
MCV: 90.7 fL (ref 78.0–100.0)
Platelets: 214 10*3/uL (ref 150–400)
RBC: 3.78 MIL/uL — AB (ref 3.87–5.11)
RDW: 14.8 % (ref 11.5–15.5)
WBC: 7.3 10*3/uL (ref 4.0–10.5)

## 2015-11-29 LAB — RENAL FUNCTION PANEL
ANION GAP: 9 (ref 5–15)
Albumin: 2 g/dL — ABNORMAL LOW (ref 3.5–5.0)
BUN: 11 mg/dL (ref 6–20)
CO2: 27 mmol/L (ref 22–32)
Calcium: 9.2 mg/dL (ref 8.9–10.3)
Chloride: 99 mmol/L — ABNORMAL LOW (ref 101–111)
Creatinine, Ser: 0.48 mg/dL (ref 0.44–1.00)
Glucose, Bld: 129 mg/dL — ABNORMAL HIGH (ref 65–99)
Phosphorus: 2.9 mg/dL (ref 2.5–4.6)
Potassium: 3.4 mmol/L — ABNORMAL LOW (ref 3.5–5.1)
SODIUM: 135 mmol/L (ref 135–145)

## 2015-11-29 LAB — GLUCOSE, CAPILLARY
GLUCOSE-CAPILLARY: 113 mg/dL — AB (ref 65–99)
GLUCOSE-CAPILLARY: 137 mg/dL — AB (ref 65–99)
Glucose-Capillary: 100 mg/dL — ABNORMAL HIGH (ref 65–99)
Glucose-Capillary: 109 mg/dL — ABNORMAL HIGH (ref 65–99)
Glucose-Capillary: 113 mg/dL — ABNORMAL HIGH (ref 65–99)
Glucose-Capillary: 114 mg/dL — ABNORMAL HIGH (ref 65–99)

## 2015-11-29 LAB — MAGNESIUM: MAGNESIUM: 1.8 mg/dL (ref 1.7–2.4)

## 2015-11-29 MED ORDER — POTASSIUM CHLORIDE 10 MEQ/50ML IV SOLN
INTRAVENOUS | Status: AC
  Filled 2015-11-29: qty 50

## 2015-11-29 MED ORDER — MAGNESIUM SULFATE 2 GM/50ML IV SOLN
2.0000 g | Freq: Once | INTRAVENOUS | Status: AC
  Administered 2015-11-29: 2 g via INTRAVENOUS
  Filled 2015-11-29: qty 50

## 2015-11-29 MED ORDER — POTASSIUM CHLORIDE 10 MEQ/50ML IV SOLN
10.0000 meq | INTRAVENOUS | Status: AC
  Administered 2015-11-29 (×4): 10 meq via INTRAVENOUS
  Filled 2015-11-29 (×3): qty 50

## 2015-11-29 MED ORDER — PRO-STAT SUGAR FREE PO LIQD
30.0000 mL | Freq: Every day | ORAL | Status: DC
  Administered 2015-11-29 – 2015-12-02 (×4): 30 mL via ORAL
  Filled 2015-11-29 (×3): qty 30

## 2015-11-29 MED ORDER — JEVITY 1.2 CAL PO LIQD
1000.0000 mL | ORAL | Status: DC
  Administered 2015-11-29 – 2015-12-02 (×4): 1000 mL
  Filled 2015-11-29 (×9): qty 1000

## 2015-11-29 NOTE — Progress Notes (Signed)
[facility] TEAM 1 - Stepdown/ICU TEAM Progress Note  Megan [name]:[id number] DOB: [birth year] DOA: 11/19/2015 PCP: [name], MD  Admit HPI / Brief Narrative: 82 year-old BF PMHx Dementia HTN, Diastolic Dysfunction, HLD, Diabetes Type 2, Hypothyroidism  cent pan sensitive E coli UTI admitted with altered mental status, hypertensive crisis. CXR consistent with PNA. EEG showing status epilepticus. MRI showing stroke with dominant infarcts possibly embolic from an unknown source.   HPI/Subjective: 1/27 A/O 0, will follow some commands, will attempt to answer some questions.  Assessment/Plan: Acute Encephalopathy/Multiple Stroke  - MRI 1/20 showing multiple acute strokes. Fentanyl 1 dose 1/17 & Propofol 1/17 -1/20. At present, she opens eyes on command. She is occasionally answering questions by saying "yes" but is slow to respond. Some movement of extremities on verbal command.  - Continue Aspirin 300 mg suppository  - PT eval pending - OT eval pending  - Continue to monitor.  Status Epiepticus -  -Seen on EEG 1/17.  - Keppra 1gm IV q12hr - Neurology has signed off  Acute Respiratory Failure with hypoxia-  -Secondary to CVA & inability to protect airway.  -1/27 failed swallow study; NPO  - Continue Incentive Spirometry - Continue oxygen supplementation to keep O2 saturation >92%  HCAP  - Treated with Vancomycin (1/17-1/19) and Zosyn (1/17-1/20). Blood cx (1/17) negative. - Finished course of antibiotics.  - Plan to re-culture for fever  E coli UTI -  -Ctx on 1/13 w/ E. Coli. Repeat Ctx on 1/17 w/ insignificant growth. - Finished course of antibiotics.  - Plan to re-culture for fever  DM Type 2 -  -Blood glucose controlled. A1c 6.4.  -Accu-Checks q4hr - Low Dose SSI per algorithm  Hypothyroidism - Continue Synthroid  IV daily  Normocytic anemia - no evidence of active bleeding. Hgb stable at 9.9. - Trending Hgb daily with CBC  Severe  Protein Calorie Malnutrition -  -discontinued tube feeds on 1/25 after extubation. Patient failed her swallow eval this morning. Her son/ HCPOA  Does wish to re-initiate tube feeds for the patient. Family's understanding is if patient does not recover significant ability to protect airway and it on her own within 3-5 days NG tube would be withdrawn per her wishes and her living will -CorTrak placed and patient started on Jevity 1.2 CAL at 11ml/hr  Hx of GERD - Continue Protonix IV daily  Hypertensive Emergency (Resolved) - IV Hydralazine 10 mg q4 prn SBP >165 or DBP >105 - Monitor in telemetry.  Tachycardia (Resolved) - Monitor in telemetry.  Electrolyte abnormalities (Resolved) - included hypokalemia, hypophosphatemia, and hypomagenesemia.  - Monitor electrolytes daily and replete as needed   Diminished urine output (Resolved) - Trending urine output with foley  - Trending renal function daily with BUN/Creatinine  DVT prophylaxis - SCDs  - Heparin [place] q8hr  Diet  - Jevity 1.5 CAL  Dispo: Disposition is deferred at this time, awaiting improvement of current medical problems. Anticipated discharge in approximately 5-7 day(s).   Goals of Care - 1/27Consult to Palliative Care Pt w/ Multi Stroke bilat. Poor prognoses. Discuss short term vs Long term options if Pt does not turn around    Code Status: DO NOT RESUSCITATE Family Communication: Family present at time of exam Disposition Plan: Will await palliative care meeting    Consultants: Dr.Jennings E Nestor [facility] M Dr.David L Rinehuls stroke team   Procedure/Significant Events: 1/17 - intubated & started on labetalol for HTN 1/17 EEG;-sharply contoured, at times periodic activity in the  left temporoparietal region.-Duration of faster frequencies on the right 1/18 - Obtunded off sedation. Started on Keppra for Seizures by Neurology. 1/19 - Continues to be obtunded & bradycardic with HR in 40s-50s. 1/21 - MRI with  bilateral CVA & family made aware.  1/23 - Eyes opening but not following commands.  1/25 - One-Way Extubation to Nasal Cannula. Tube feeds stopped.   STUDIES:  1/17 CT Head: No acute finding. Extensive white matter disease  1/17 EEG : Focal status epilepticus  1/18 TTE : LV normal in size w/ EF 55-60% & grade 1 diastolic dysfunction. PASP . Moderate aortic regurg. No PFO. 1/20 MRI : Acute nonhemorrhagic LEFT ACA territory infarction affecting much of the medial LEFT frontal lobe. Thrombosis of the LEFT anterior cerebral artery is suspected. Acute LEFT parieto-occipital boundary zone infarct between the LEFT MCA and PCA vascular territories. Multifocal subcentimeter infarction RIGHT parieto-occipital cortex and white matter, probably also representing boundary zone infarction, versus shower of emboli. Advanced atrophy and small vessel disease. 1/22 CXR : Persistent LLL opacity.   Culture Blood Ctx x2 (1/17): Negative Urine Ctx (1/17): 1000 colonies, insignificant  Antibiotics: Zosyn 1/17- 1/20 Vancomycin 1/17 - 1/19  DVT prophylaxis:    Devices    LINES / TUBES:  Right IJ CVL 1/17>>> OETT 1/17>>> OGT 1/17>>> Foley Catheter 1/17>>> PIV x1    Continuous Infusions: . feeding supplement (JEVITY 1.2 CAL) 1,000 mL (11/29/15 1706)    Objective: VITAL SIGNS: Temp: 99.2 F (37.3 C) (01/27 1952) Temp Source: Oral (01/27 1952) BP: 124/59 mmHg (01/27 1800) Pulse Rate: 90 (01/27 1800) SPO2; FIO2:   Intake/Output Summary (Last 24 hours) at 11/29/15 2005 Last data filed at 11/29/15 1800  Gross per 24 hour  Intake 1032.5 ml  Output   1045 ml  Net  -12.5 ml     Exam: General: A/O 0, follows some commands, attempts to communicate with words, No acute respiratory distress Eyes: Negative headache, ,negative scleral hemorrhage ENT: Negative Runny nose, negative gingival bleeding, Neck:  Negative scars, masses, torticollis, lymphadenopathy, JVD Lungs: Clear to  auscultation bilaterally without wheezes or crackles Cardiovascular: Regular rate and rhythm without murmur gallop or rub normal S1 and S2 Abdomen:negative abdominal pain, nondistended, positive soft, bowel sounds, no rebound, no ascites, no appreciable mass Extremities: No significant cyanosis, clubbing, or edema bilateral lower extremities Psychiatric: Unable to fully assess secondary to altered mental status  Neurologic:  Unable to fully assess secondary to altered mental status. With June all extremities from painful stimuli. Moved bilateral upper extremities to command i.e. squeezed fingers lifted arm.  Data Reviewed: Basic Metabolic Panel:  Recent Labs Lab 11/25/15 0345 11/26/15 0353 11/27/15 0504 11/28/15 0430 11/28/15 0441 11/29/15 0420  NA 143 141 140  --  138 135  K 3.8 4.0 3.4*  --  3.7 3.4*  CL 106 107 97*  --  98* 99*  CO2 --  32 27  GLUCOSE 156* 147* 145*  --  114* 129*  BUN --  16 11  CREATININE 0.45 0.42* 0.46  --  0.46 0.48  CALCIUM 8.7* 8.6* 8.3*  --  8.9 9.2  MG 1.9 1.8 1.7 1.8  --  1.8  PHOS 2.2* 2.9 2.7  --  3.0 2.9   Liver Function Tests:  Recent Labs Lab 11/26/15 0353 11/27/15 0504 11/28/15 0441 11/29/15 0420  ALBUMIN 1.7* 1.7* 1.8* 2.0*   No results for input(s): LIPASE, AMYLASE in the last 168 hours. No results  for input(s): AMMONIA in the last 168 hours. CBC:  Recent Labs Lab 11/25/15 0345 11/26/15 0353 11/27/15 0504 11/28/15 0430 11/29/15 0420  WBC 8.8 9.2 8.6 6.5 7.3  HGB 9.9* 9.5* 9.4* 9.9* 11.4*  HCT 30.1* 29.5* 29.7* 31.3* 34.3*  MCV 92.3 91.9 92.0 92.6 90.7  PLT 138* 150 168 189 214   Cardiac Enzymes: No results for input(s): CKTOTAL, CKMB, CKMBINDEX, TROPONINI in the last 168 hours. BNP (last 3 results) No results for input(s): BNP in the last 8760 hours.  ProBNP (last 3 results) No results for input(s): PROBNP in the last 8760 hours.  CBG:  Recent Labs Lab 11/28/15 2355 11/29/15 0341  11/29/15 0802 11/29/15 1144 11/29/15 1535  GLUCAP 100* 113* 109* 113* 114*    No results found for this or any previous visit (from the past 240 hour(s)).   Studies:  Recent x-ray studies have been reviewed in detail by the Attending Physician  Scheduled Meds:  Scheduled Meds: . antiseptic oral rinse  7 mL Mouth Rinse QID  . aspirin  300 mg Rectal Daily  . chlorhexidine gluconate  15 mL Mouth Rinse BID  . feeding supplement (PRO-STAT SUGAR FREE 64)  30 mL Oral Daily  . heparin subcutaneous  5,000 Units Subcutaneous 3 times per day  . insulin aspart  0-9 Units Subcutaneous 6 times per day  . latanoprost  1 drop Both Eyes QHS  . levETIRAcetam  1,000 mg Intravenous Q12H  . levothyroxine  25 mcg Intravenous Daily  . pantoprazole (PROTONIX) IV  40 mg Intravenous QHS    Time spent on care of this patient: 40 mins   Mahad Newstrom, Roselind Messier , MD  [facility] Office  [phone] Pager - [phone]  On-Call/Text Page:      [email or web address]      password [email or web address]  If 7PM-7AM, please contact night-coverage [email or web address] Password [email or web address] 11/29/2015, 8:05 PM   LOS: 10 days   Care during the described time interval was provided by me .  I have reviewed this patient's available data, including medical history, events of note, physical examination, and all test results as part of my evaluation. I have personally reviewed and interpreted all radiology studies.   Carolyne Littles, MD [phone] Pager

## 2015-11-29 NOTE — Progress Notes (Signed)
Pt transferred to 2C08 without incident.  Bedside report to Rosamaria Lints, RN.

## 2015-11-29 NOTE — Evaluation (Signed)
Physical Therapy Evaluation Patient Details Name: Megan [name] MRN: [id number] DOB: 1927/09/27 Today's Date: 11/29/2015   History of Present Illness  82 y.o. female intubated 11/19/15-11/27/15 extubated MRI(+) L ACA, L frontal infarct L parieto- occipital AMS HTN PES Syndrome. Pt now with L heel pressure wound and sacrum wound Stage III PMH: h/o dementia, HTN, DM II, Arthritis, GERD,   Clinical Impression  Pt admitted with/for above problems.  Pt currently limited functionally due to the problems listed. ( See problems list.)   Pt will benefit from PT to maximize function and safety in order to get ready for next venue listed below.  Pt/family might benefit from palliative consult.     Follow Up Recommendations SNF    Equipment Recommendations  None recommended by PT    Recommendations for Other Services       Precautions / Restrictions Precautions Precautions: Fall Precaution Comments: nasal cannula      Mobility  Bed Mobility Overal bed mobility: +2 for physical assistance;+ 2 for safety/equipment;Needs Assistance Bed Mobility: Supine to Sit;Sit to Supine;Rolling Rolling: Total assist (max (A) to the R)   Supine to sit: +2 for safety/equipment;+2 for physical assistance;Total assist Sit to supine: +2 for safety/equipment;+2 for physical assistance;Total assist   General bed mobility comments: pt noted to have sacrum break down and new pink dressing applied after hygiene  Transfers                 General transfer comment: not appropriate at this time  Ambulation/Gait             General Gait Details: not tested  Stairs            Wheelchair Mobility    Modified Rankin (Stroke Patients Only)       Balance Overall balance assessment: Needs assistance Sitting-balance support: No upper extremity supported;Feet supported Sitting balance-Leahy Scale: Zero Sitting balance - Comments: to attempts by pt to right herself or pull into  midline Postural control: Posterior lean                                   Pertinent Vitals/Pain Pain Assessment: Faces Faces Pain Scale: Hurts even more Pain Location: vague.  grimace with bed mobility Pain Descriptors / Indicators: Grimacing Pain Intervention(s): Monitored during session;Premedicated before session    Home Living Family/patient expects to be discharged to:: Private residence Living Arrangements: Children;Other relatives Available Help at Discharge: Family;Available PRN/intermittently Type of Home: House Home Access: Stairs to enter   Entrance Stairs-Number of Steps: 4 Home Layout: One level   Additional Comments: pt lives at home alone with family providing all meals. Family reports last known at baseline 3 weeks ago ( first week Jan 2017) Pt requires (A) at baseline for adls    Prior Function Level of Independence: Needs assistance   Gait / Transfers Assistance Needed: walks with RW  ADL's / Homemaking Assistance Needed: requires (A) for all adls and wears adult brief for inconstinence        Hand Dominance   Dominant Hand: Right    Extremity/Trunk Assessment   Upper Extremity Assessment: Defer to OT evaluation RUE Deficits / Details: AROM noted  with digit flexion, pt with resistance to PROM . pt noted to have edema throughout entire arm         Lower Extremity Assessment: Difficult to assess due to impaired cognition;Generalized weakness RLE Deficits / Details: with  drawal to painful stimulus LLE Deficits / Details: withdrawal to painful stimulus  Cervical / Trunk Assessment: Kyphotic;Other exceptions  Communication   Communication: Expressive difficulties;Receptive difficulties  Cognition Arousal/Alertness: Awake/alert Behavior During Therapy: Flat affect Overall Cognitive Status: History of cognitive impairments - at baseline                      General Comments General comments (skin integrity, edema, etc.):  new dressing applied to sacral breakdown    Exercises        Assessment/Plan    PT Assessment Patient needs continued PT services  PT Diagnosis Generalized weakness;Altered mental status   PT Problem List Decreased strength;Decreased activity tolerance;Decreased balance;Decreased mobility;Decreased coordination;Decreased cognition;Decreased safety awareness;Decreased skin integrity  PT Treatment Interventions Gait training;Functional mobility training;Therapeutic activities;Therapeutic exercise;Balance training;Patient/family education   PT Goals (Current goals can be found in the Care Plan section) Acute Rehab PT Goals Patient Stated Goal: none PT Goal Formulation: Patient unable to participate in goal setting Time For Goal Achievement: 12/13/15 Potential to Achieve Goals: Fair    Frequency Min 3X/week   Barriers to discharge        Co-evaluation PT/OT/SLP Co-Evaluation/Treatment: Yes Reason for Co-Treatment: Complexity of the patient's impairments (multi-system involvement) PT goals addressed during session: Mobility/safety with mobility OT goals addressed during session: ADL's and self-care;Strengthening/ROM       End of Session   Activity Tolerance: Treatment limited secondary to medical complications (Comment) Patient left: in bed;with call bell/phone within reach;with bed alarm set;with family/visitor present Nurse Communication: Mobility status         Time: 1610-9604 PT Time Calculation (min) (ACUTE ONLY): 22 min   Charges:   PT Evaluation $PT Eval Moderate Complexity: 1 Procedure $PT Eval High Complexity: 1 Procedure     PT G Codes:        Nikia Levels, Eliseo Gum 11/29/2015, 2:53 PM 11/29/2015  [place] Bing, PT [phone]  (pager)

## 2015-11-29 NOTE — Progress Notes (Signed)
Nutrition Follow-up  DOCUMENTATION CODES:   Severe malnutrition in context of chronic illness, Underweight  INTERVENTION:    Initiate TF via Cortrak tube with Jevity 1.2 at 25 ml/h and Prostat 30 ml once daily on day 1; on day 2, increase to goal rate of 50 ml/h (1200 ml per day) to provide 1540 kcals, 82 gm protein, 972 ml free water daily.  NUTRITION DIAGNOSIS:   Malnutrition related to chronic illness as evidenced by severe depletion of body fat, severe depletion of muscle mass.  Ongoing  GOAL:   Patient will meet greater than or equal to 90% of their needs  Unmet  MONITOR:   TF tolerance, Weight trends, Skin, I & O's, Labs  ASSESSMENT:   82 y.o. female history of hypertension, diastolic dysfunction, hypothyroidism, type 2 diabetes mellitus and dementia who was discharged yesterday following management of an Escherichia coli UTI, and readmitted today after being found unresponsive. Patient was intubated on arrival in the ED.   Patient was extubated on 1/25. She has failed swallow evaluation with SLP. Per discussion with RN and physician, plans for short term small bore feeding tube placement for TF to see if patient improves over the next few days. Spoke with Dr. Joseph Art, RD to order TF. Cortrak tube being placed this afternoon.   Diet Order:   NPO  Skin:  Wound (see comment) (Stage II pressure ulcer on sacrum, DTI on L heel)  Last BM:  1/26  Height:   Ht Readings from Last 1 Encounters:  11/19/15 5' 8.11" (1.73 m)    Weight:   Wt Readings from Last 1 Encounters:  11/29/15 124 lb 9 oz (56.5 kg)    Ideal Body Weight:  63.8 kg  BMI:  Body mass index is 18.88 kg/(m^2).  Estimated Nutritional Needs:   Kcal:  1400-1600  Protein:  70-90 gm  Fluid:  1.5-1.6 L  EDUCATION NEEDS:   No education needs identified at this time  Joaquin Courts, RD, LDN, CNSC Pager [phone] After Hours Pager [phone]

## 2015-11-29 NOTE — Evaluation (Signed)
Occupational Therapy Evaluation Patient Details Name: Megan [name] MRN: [id number] DOB: 06-28-1927 Today's Date: 11/29/2015    History of Present Illness 82 y.o. female intubated 11/19/15-11/27/15 extubated MRI(+) L ACA, L frontal infarct L parieto- occipital AMS HTN PES Syndrome. Pt now with L heel pressure wound and sacrum wound Stage III PMH: h/o dementia, HTN, DM II, Arthritis, GERD,    Clinical Impression   PT admitted with CVA and PES syndrome. Pt currently with functional limitiations due to the deficits listed below (see OT problem list). PTA living with family (A) on RW and (A) for all meals. Pt was able to remain in her home due to large family support.  Pt will benefit from skilled OT to increase their independence and safety with adls and balance to allow discharge SNF 24/7 vs palliative consult .     Follow Up Recommendations  SNF;Supervision/Assistance - 24 hour;Other (comment) (palliative consult with DME )    Equipment Recommendations  Hospital bed (mattress overall)    Recommendations for Other Services Other (comment) (Palliative consult)     Precautions / Restrictions Precautions Precautions: Fall Precaution Comments: nasal cannula      Mobility Bed Mobility Overal bed mobility: +2 for physical assistance;+ 2 for safety/equipment;Needs Assistance Bed Mobility: Supine to Sit;Sit to Supine     Supine to sit: +2 for safety/equipment;+2 for physical assistance;Total assist Sit to supine: +2 for safety/equipment;+2 for physical assistance;Total assist   General bed mobility comments: pt noted to have sacrum break down and new pink dressing applied after hygiene  Transfers                 General transfer comment: not appropriate at this time    Balance Overall balance assessment: Needs assistance Sitting-balance support: No upper extremity supported;Feet supported Sitting balance-Leahy Scale: Zero   Postural control: Posterior lean                                   ADL Overall ADL's : Needs assistance/impaired Eating/Feeding: NPO   Grooming: Total assistance   Upper Body Bathing: Total assistance   Lower Body Bathing: Total assistance             Toilet Transfer Details (indicate cue type and reason): incontinent on arrival with total (A) for peri care and hygiene with foley in place           General ADL Comments: Pt unaware of incontinence on arrival. pt holding blankets and with verbal cues to release unable to follow command. Pt noted AROM in BIL UE. pt states "oh lord" "yes honey" "im ready" during session as general response to movement and questions. pt not following commands but awake. pt holding any objects near hands. pt reaching and pulling oxygen out of nose several times during session     Vision     Perception     Praxis      Pertinent Vitals/Pain Pain Assessment: Faces Faces Pain Scale: Hurts even more Pain Location: facial grimace with bed mobility Pain Descriptors / Indicators: Grimacing Pain Intervention(s): Monitored during session;Premedicated before session;Repositioned (resolved with repositioning)     Hand Dominance Right   Extremity/Trunk Assessment Upper Extremity Assessment Upper Extremity Assessment: Generalized weakness;RUE deficits/detail RUE Deficits / Details: AROM noted  with digit flexion, pt with resistance to PROM . pt noted to have edema throughout entire arm   Lower Extremity Assessment Lower Extremity Assessment: Defer to PT  evaluation;LLE deficits/detail;RLE deficits/detail RLE Deficits / Details: with drawal to painful stimulus LLE Deficits / Details: withdrawal to painful stimulus   Cervical / Trunk Assessment Cervical / Trunk Assessment: Kyphotic;Other exceptions Cervical / Trunk Exceptions: neck lateral flexion with rotation and family reports this is baseline   Communication Communication Communication: Expressive difficulties;Receptive  difficulties   Cognition Arousal/Alertness: Awake/alert Behavior During Therapy: Flat affect Overall Cognitive Status: History of cognitive impairments - at baseline                     General Comments       Exercises       Shoulder Instructions      Home Living Family/patient expects to be discharged to:: Private residence Living Arrangements: Children;Other relatives Available Help at Discharge: Family;Available PRN/intermittently Type of Home: House Home Access: Stairs to enter Entrance Stairs-Number of Steps: 4   Home Layout: One level     Bathroom Shower/Tub: Chief Strategy Officer: Handicapped height         Additional Comments: pt lives at home alone with family providing all meals. Family reports last known at baseline 3 weeks ago ( first week Jan 2017) Pt requires (A) at baseline for adls      Prior Functioning/Environment Level of Independence: Needs assistance  Gait / Transfers Assistance Needed: walks with RW ADL's / Homemaking Assistance Needed: requires (A) for all adls and wears adult brief for inconstinence        OT Diagnosis: Generalized weakness;Cognitive deficits;Acute pain   OT Problem List: Decreased strength;Decreased activity tolerance;Impaired balance (sitting and/or standing);Decreased cognition;Decreased safety awareness;Decreased knowledge of use of DME or AE;Decreased knowledge of precautions;Cardiopulmonary status limiting activity;Pain;Impaired UE functional use   OT Treatment/Interventions: Self-care/ADL training;Therapeutic exercise;DME and/or AE instruction;Therapeutic activities;Cognitive remediation/compensation;Patient/family education;Balance training    OT Goals(Current goals can be found in the care plan section) Acute Rehab OT Goals Patient Stated Goal: none OT Goal Formulation: With family Time For Goal Achievement: 12/13/15 Potential to Achieve Goals: Fair  OT Frequency: Min 2X/week   Barriers to  D/C:            Co-evaluation PT/OT/SLP Co-Evaluation/Treatment: Yes Reason for Co-Treatment: Complexity of the patient's impairments (multi-system involvement);For patient/therapist safety   OT goals addressed during session: ADL's and self-care;Strengthening/ROM      End of Session Nurse Communication: Mobility status;Precautions  Activity Tolerance: Patient tolerated treatment well Patient left: in bed;with call bell/phone within reach;Other (comment) (MD arrival)   Time: 1250-1330 OT Time Calculation (min): 40 min Charges:  OT General Charges $OT Visit: 1 Procedure OT Evaluation $OT Eval High Complexity: 1 Procedure G-Codes:    Harolyn Rutherford 12/05/2015, 2:03 PM  Mateo Flow   OTR/L Pager: [phone] Office: [phone] .

## 2015-11-29 NOTE — Progress Notes (Signed)
Speech Language Pathology Treatment: Dysphagia  Patient Details Name: Megan [name] MRN: [id number] DOB: Oct 11, 1927 Today's Date: 11/29/2015 Time: 8295-6213 SLP Time Calculation (min) (ACUTE ONLY): 13 min  Assessment / Plan / Recommendation Clinical Impression  Pt seen for trials of PO with goal of diet initiation. Pts family have told MD they would not want NG tube, so SLP considering methods of PO intake with known risk of aspiration. Pt is more alert today and verbalizing greetings and automatic phrases, though still unable to follow commands or participate in functional ADLs. Repositioned pt and requested RN hold head upright as much as possible as pt is positioned firmly with head down and tilted left due to hypertonicity. Administered teaspoon of honey thick liquids, which pt responded to with appropriate oral movement, but no posterior transit of bolus. Pts position limits passive posterior movement of bolus to pharynx for reflexive swallow. Bolus suctioned from left buccal cavity despite max verbal and tactile cueing (dry spoons) from SLP and RN. Pt would not be capable of PO intake even if diet were initiated with known risk.  Pt would not sustain nutritional needs. Palliative care consult may be helpful to aid family in goals of care and accessing resources for pts care after d/c.    HPI HPI: 82 year-old with recent pan sensitive E coli UTI admitted with altered mental status, hypertensive crisis. CXR consistent with PNA. Marland Kitchen MRI showing stroke with dominant infarcts possibly embolic from an unknown source.  LEFT ACA territory infarction affecting much of the medial LEFT frontal lobe. Thrombosis of the LEFT anterior cerebral artery is suspected. Acute LEFT parieto-occipital boundary zone infarct between the LEFT MCA and PCA vascular territories. Multifocal subcentimeter infarction RIGHT parieto-occipital cortex and white matter, probably also representing boundary zone infarction, versus shower  of emboli. Advanced atrophy and small vessel disease. Pt intubated from 1/17 -1/25.       SLP Plan  Continue with current plan of care     Recommendations  Diet recommendations: NPO             Plan: Continue with current plan of care     GO               [facility], MA CCC-SLP [phone]  Claudine Mouton 11/29/2015, 9:55 AM

## 2015-11-30 DIAGNOSIS — Z515 Encounter for palliative care: Secondary | ICD-10-CM

## 2015-11-30 LAB — RENAL FUNCTION PANEL
ANION GAP: 6 (ref 5–15)
Albumin: 1.9 g/dL — ABNORMAL LOW (ref 3.5–5.0)
BUN: 15 mg/dL (ref 6–20)
CHLORIDE: 103 mmol/L (ref 101–111)
CO2: 29 mmol/L (ref 22–32)
Calcium: 9 mg/dL (ref 8.9–10.3)
Creatinine, Ser: 0.45 mg/dL (ref 0.44–1.00)
GFR calc Af Amer: >60 mL/min (ref >60)
GFR calc non Af Amer: >60 mL/min (ref >60)
GLUCOSE: 87 mg/dL (ref 65–99)
POTASSIUM: 4 mmol/L (ref 3.5–5.1)
Phosphorus: 2.8 mg/dL (ref 2.5–4.6)
Sodium: 138 mmol/L (ref 135–145)

## 2015-11-30 LAB — GLUCOSE, CAPILLARY
GLUCOSE-CAPILLARY: 99 mg/dL (ref 65–99)
GLUCOSE-CAPILLARY: 156 mg/dL — AB (ref 65–99)
GLUCOSE-CAPILLARY: 120 mg/dL — AB (ref 65–99)
GLUCOSE-CAPILLARY: 135 mg/dL — AB (ref 65–99)
GLUCOSE-CAPILLARY: 135 mg/dL — AB (ref 65–99)
Glucose-Capillary: 126 mg/dL — ABNORMAL HIGH (ref 65–99)

## 2015-11-30 LAB — CBC
HEMATOCRIT: 33.4 % — AB (ref 36.0–46.0)
Hemoglobin: 10.7 g/dL — ABNORMAL LOW (ref 12.0–15.0)
MCH: 29.5 pg (ref 26.0–34.0)
MCHC: 32 g/dL (ref 30.0–36.0)
MCV: 92 fL (ref 78.0–100.0)
Platelets: 235 10*3/uL (ref 150–400)
RBC: 3.63 MIL/uL — ABNORMAL LOW (ref 3.87–5.11)
RDW: 15 % (ref 11.5–15.5)
WBC: 6.1 10*3/uL (ref 4.0–10.5)

## 2015-11-30 LAB — MAGNESIUM: Magnesium: 2.1 mg/dL (ref 1.7–2.4)

## 2015-11-30 MED ORDER — PANTOPRAZOLE SODIUM 40 MG PO PACK
40.0000 mg | PACK | Freq: Every day | ORAL | Status: DC
  Administered 2015-11-30 – 2015-12-03 (×4): 40 mg
  Filled 2015-11-30 (×4): qty 20

## 2015-11-30 NOTE — Progress Notes (Signed)
[facility] TEAM 1 - Stepdown/ICU TEAM Progress Note  Megan [name]:[id number] DOB: [birth year] DOA: 11/19/2015 PCP: [name], MD  Admit HPI / Brief Narrative: 82 year-old BF PMHx Dementia HTN, Diastolic Dysfunction, HLD, Diabetes Type 2, Hypothyroidism  Recent pan sensitive E coli UTI admitted with altered mental status, hypertensive crisis. CXR consistent with PNA. EEG showing status epilepticus. MRI showing stroke with dominant infarcts possibly embolic from an unknown source.   HPI/Subjective: 1/28 A/O 0, will follow some commands, will attempt to answer some questions.  Assessment/Plan: Acute Encephalopathy/Multiple Stroke  - MRI 1/20 showing multiple acute strokes. Fentanyl 1 dose 1/17 & Propofol 1/17 -1/20. At present, she opens eyes on command. She is occasionally answering questions by saying "yes" but is slow to respond. Some movement of extremities on verbal command.  - Continue Aspirin 300 mg suppository  - PT/OT recommend SNF   Status Epiepticus -  -Seen on EEG 1/17.  - Keppra 1gm IV q12hr - Neurology has signed off  Acute Respiratory Failure with hypoxia-  -Secondary to CVA & inability to protect airway.  -1/27 failed swallow study; NPO  - Continue Incentive Spirometry - Continue oxygen supplementation to keep O2 saturation >92%  HCAP  - Treated with Vancomycin (1/17-1/19) and Zosyn (1/17-1/20). Blood cx (1/17) negative. - Finished course of antibiotics.  - Plan to re-culture for fever  E coli UTI -  -Ctx on 1/13 w/ E. Coli. Repeat Ctx on 1/17 w/ insignificant growth. - Finished course of antibiotics.  - Plan to re-culture for fever  DM Type 2 -  -Blood glucose controlled. A1c 6.4.  -Continue Accu-Checks q4hr - Low Dose SSI   Hypothyroidism - Continue Synthroid  IV daily  Normocytic anemia  - no evidence of active bleeding. Hgb stable at 9.9. - Trending Hgb daily with CBC  Severe Protein Calorie Malnutrition -    -discontinued tube feeds on 1/25 after extubation. Patient failed her swallow eval this morning. Her son/ HCPOA  Does wish to re-initiate tube feeds for the patient. Family's understanding is if patient does not recover significant ability to protect airway and eat on her own within 3-5 days NG tube would be withdrawn per her wishes as stated in her  living will -CorTrak placed and patient started on Jevity 1.2 CAL at 62ml/hr  Hx of GERD - Continue Protonix IV daily  Hypertensive Emergency  - IV Hydralazine 10 mg q4 prn SBP >165 or DBP >105  Tachycardia - Resolved  Electrolyte abnormalities  - WNL    Diminished urine output  - Strict in and out since admission +9.3 L  - Cr  WNL  Diet  - Jevity 1.5 CAL  Goals of Care - 1/27Consult to Palliative Care Pt w/ Multi Stroke bilat. Poor prognoses. Discuss short term vs Long term options if Pt does not turn around -Palliative care meeting on 1/28;Per chart review family desiring feeding via Dobbhoff and time to see how much she can recover before making another plan Planning to meet son, Karma Lew at 1100 am 12/01/15    Code Status: DO NOT RESUSCITATE Family Communication: Family present at time of exam Disposition Plan: Will await palliative care meeting    Consultants: Dr.Jennings E Nestor [facility] M Dr.David L Rinehuls stroke team   Procedure/Significant Events: 1/17 - intubated & started on labetalol for HTN 1/17 EEG;-sharply contoured, at times periodic activity in the left temporoparietal region.-Duration of faster frequencies on the right 1/18 - Obtunded off sedation. Started on Keppra for  Seizures by Neurology. 1/19 - Continues to be obtunded & bradycardic with HR in 40s-50s. 1/21 - MRI with bilateral CVA & family made aware.  1/23 - Eyes opening but not following commands.  1/25 - One-Way Extubation to Nasal Cannula. Tube feeds stopped.   STUDIES:  1/17 CT Head: No acute finding. Extensive white matter  disease  1/17 EEG : Focal status epilepticus  1/18 TTE : LV normal in size w/ EF 55-60% & grade 1 diastolic dysfunction. PASP . Moderate aortic regurg. No PFO. 1/20 MRI : Acute nonhemorrhagic LEFT ACA territory infarction affecting much of the medial LEFT frontal lobe. Thrombosis of the LEFT anterior cerebral artery is suspected. Acute LEFT parieto-occipital boundary zone infarct between the LEFT MCA and PCA vascular territories. Multifocal subcentimeter infarction RIGHT parieto-occipital cortex and white matter, probably also representing boundary zone infarction, versus shower of emboli. Advanced atrophy and small vessel disease. 1/22 CXR : Persistent LLL opacity.   Culture Blood Ctx x2 (1/17): Negative Urine Ctx (1/17): 1000 colonies, insignificant  Antibiotics: Zosyn 1/17- 1/20 Vancomycin 1/17 - 1/19  DVT prophylaxis: Subcutaneous heparin   Devices    LINES / TUBES:  Right IJ CVL 1/17>>> OETT 1/17>>> OGT 1/17>>> Foley Catheter 1/17>>> PIV x1    Continuous Infusions: . feeding supplement (JEVITY 1.2 CAL) 1,000 mL (11/30/15 2010)    Objective: VITAL SIGNS: Temp: 98.5 F (36.9 C) (01/29 0400) Temp Source: Axillary (01/29 0400) BP: 165/56 mmHg (01/29 0400) Pulse Rate: 67 (01/29 0400) SPO2; FIO2:   Intake/Output Summary (Last 24 hours) at 12/01/15 0813 Last data filed at 12/01/15 0600  Gross per 24 hour  Intake 1417.08 ml  Output   1400 ml  Net  17.08 ml     Exam: General: A/O 0, follows some commands, answers simple questions , No acute respiratory distress Eyes: Negative headache, ,negative scleral hemorrhage ENT: Negative Runny nose, negative gingival bleeding, Neck:  Negative scars, masses, torticollis, lymphadenopathy, JVD Lungs: Clear to auscultation bilaterally without wheezes or crackles Cardiovascular: Regular rate and rhythm without murmur gallop or rub normal S1 and S2 Abdomen:negative abdominal pain, nondistended, positive soft,  bowel sounds, no rebound, no ascites, no appreciable mass Extremities: No significant cyanosis, clubbing, or edema bilateral lower extremities Psychiatric: Unable to fully assess secondary to altered mental status  Neurologic:  Unable to fully assess secondary to altered mental status. With June all extremities from painful stimuli. Moved bilateral upper extremities to command i.e. squeezed fingers lifted arm.  Data Reviewed: Basic Metabolic Panel:  Recent Labs Lab 11/27/15 0504 11/28/15 0430 11/28/15 0441 11/29/15 0420 11/30/15 0333 12/01/15 0543  NA 140  --  138 135 138 139  K 3.4*  --  3.7 3.4* 4.0 3.8  CL 97*  --  98* 99* 103 101  CO2 30  --  32 GLUCOSE 145*  --  114* 129* 87 132*  BUN 17  --  CREATININE 0.46  --  0.46 0.48 0.45 0.46  CALCIUM 8.3*  --  8.9 9.2 9.0 9.0  MG 1.7 1.8  --  1.8 2.1 2.0  PHOS 2.7  --  3.0 2.9 2.8 2.6   Liver Function Tests:  Recent Labs Lab 11/27/15 0504 11/28/15 0441 11/29/15 0420 11/30/15 0333 12/01/15 0543  ALBUMIN 1.7* 1.8* 2.0* 1.9* 1.9*   No results for input(s): LIPASE, AMYLASE in the last 168 hours. No results for input(s): AMMONIA in the last 168 hours. CBC:  Recent Labs Lab 11/27/15 0504  11/28/15 0430 11/29/15 0420 11/30/15 0333 12/01/15 0543  WBC 8.6 6.5 7.3 6.1 6.2  HGB 9.4* 9.9* 11.4* 10.7* 10.8*  HCT 29.7* 31.3* 34.3* 33.4* 33.9*  MCV 92.0 92.6 90.7 92.0 92.4  PLT 168 189 214 235 241   Cardiac Enzymes: No results for input(s): CKTOTAL, CKMB, CKMBINDEX, TROPONINI in the last 168 hours. BNP (last 3 results) No results for input(s): BNP in the last 8760 hours.  ProBNP (last 3 results) No results for input(s): PROBNP in the last 8760 hours.  CBG:  Recent Labs Lab 11/30/15 1146 11/30/15 1613 11/30/15 2029 12/01/15 0002 12/01/15 0510  GLUCAP 120* 135* 135* 137* 124*    No results found for this or any previous visit (from the past 240 hour(s)).   Studies:  Recent x-ray studies  have been reviewed in detail by the Attending Physician  Scheduled Meds:  Scheduled Meds: . antiseptic oral rinse  7 mL Mouth Rinse QID  . aspirin  300 mg Rectal Daily  . chlorhexidine gluconate  15 mL Mouth Rinse BID  . feeding supplement (PRO-STAT SUGAR FREE 64)  30 mL Oral Daily  . heparin subcutaneous  5,000 Units Subcutaneous 3 times per day  . insulin aspart  0-9 Units Subcutaneous 6 times per day  . latanoprost  1 drop Both Eyes QHS  . levETIRAcetam  1,000 mg Intravenous Q12H  . levothyroxine  25 mcg Intravenous Daily  . pantoprazole sodium  40 mg Per Tube QHS    Time spent on care of this patient: 40 mins   Rakim Moone, Roselind Messier , MD  [facility] Office  [phone] Pager - [phone]  On-Call/Text Page:      [email or web address]      password [email or web address]  If 7PM-7AM, please contact night-coverage [email or web address] Password [email or web address] 12/01/2015, 8:13 AM   LOS: 12 days   Care during the described time interval was provided by me .  I have reviewed this patient's available data, including medical history, events of note, physical examination, and all test results as part of my evaluation. I have personally reviewed and interpreted all radiology studies.   Carolyne Littles, MD [phone] Pager

## 2015-11-30 NOTE — Progress Notes (Signed)
Speech Language Pathology Treatment: Dysphagia  Patient Details Name: Megan [name] MRN: [id number] DOB: Mar 24, 1927 Today's Date: 11/30/2015 Time: 4098-1191 SLP Time Calculation (min) (ACUTE ONLY): 21 min  Assessment / Plan / Recommendation Clinical Impression  Pt given ice chip (to increase sensory stimulation)/puree consistency via tsp (1/4 tsp amt) with oral holding and left oral residue noted with oral suctioning completed to remove stasis from oral cavity; delayed throat clearing with ice chip noted as well and pt with significantly delayed swallow initiation with ice chip consumption and absent swallow with puree despite maximal verbal/tactile cues provided to swallow puree consistency; pt able to follow simple 1-step commands and answer yes/no questions with 50% accuracy during po trials this date. Continue NPO at this time and current POC.   HPI HPI: 82 year-old with recent pan sensitive E coli UTI admitted with altered mental status, hypertensive crisis. CXR consistent with PNA. Marland Kitchen MRI showing stroke with dominant infarcts possibly embolic from an unknown source.  LEFT ACA territory infarction affecting much of the medial LEFT frontal lobe. Thrombosis of the LEFT anterior cerebral artery is suspected. Acute LEFT parieto-occipital boundary zone infarct between the LEFT MCA and PCA vascular territories. Multifocal subcentimeter infarction RIGHT parieto-occipital cortex and white matter, probably also representing boundary zone infarction, versus shower of emboli. Advanced atrophy and small vessel disease. Pt intubated from 1/17 -1/25.       SLP Plan  Continue with current plan of care     Recommendations  Diet recommendations: NPO             Oral Care Recommendations: Oral care QID;Staff/trained caregiver to provide oral care Follow up Recommendations: Skilled Nursing facility Plan: Continue with current plan of care                     Shakerria Parran,PAT, M.S., CCC-SLP 11/30/2015,  10:52 AM

## 2015-11-30 NOTE — Consult Note (Signed)
Consultation Note Date: 11/30/2015   Patient Name: Megan [name]  DOB: [birth year]  MRN: [id number]  Age / Sex: 82 y.o., female  PCP: Megan Pinto, MD Referring Physician: Allie Bossier, MD  Reason for Consultation: Establishing goals of care    Clinical Assessment/Narrative: Pt is a 82 yo female with multiple medical co-morbidities who was just discharged on 1/16 after admission for AMS, UTI readmitted from SNF on 1/17 with AMS. MRI of brain shows multiple areas of infarct as well as PNA and recurrent UTI. She is NPO and being fed by Dobbhoff. Pt has failed a MBS. She does awaken to voice. Speech is dysarthric. Per chart review pt's family has been desiring that she continue to be fed and give her more time to see how much improvement she can achieve. When I asked pt what she understands about her medical condition she replied" I going on". I asked then if she felt as if she may die, and she said she did not know, but was very tired.  Contacts/Participants in Discussion:Met with pt alone Primary Decision Maker: Megan [name] listed as first emergency contact  Relationship to Patient son HCPOA: unclear Per chart review pt has 2 daughters as well  SUMMARY OF RECOMMENDATIONS Per chart review family desiring feeding via [place] and time to see how much she can recover before making another plan Planning to meet son, Megan [name] at 1100 am 12/01/15  Code Status/Advance Care Planning: DNR    Code Status Orders        Start     Ordered   11/24/15 0944  Do not attempt resuscitation (DNR)   Continuous     11/24/15 0943    Code Status History    Date Active Date Inactive Code Status Order ID Comments User Context   11/19/2015 11:52 AM 11/24/2015  9:43 AM Full Code [id number]  Megan Colace, NP ED   11/16/2015 12:27 AM 11/18/2015  9:24 PM Full Code [id number]  Megan Quill, DO ED   11/21/2014  6:06 PM  11/22/2014  9:26 PM Full Code [id number]  Megan Eva, MD Inpatient   08/25/2013  6:11 PM 08/29/2013  5:18 PM Full Code [id number]  Megan Cellar, MD Inpatient   07/17/2013  9:50 AM 07/18/2013  4:58 PM Full Code [id number]  Megan Filler, MD Inpatient   02/27/2013  5:09 PM 03/02/2013  9:31 PM Full Code [id number]  Megan June Leap, MD Inpatient   04/27/2012  5:45 PM 05/02/2012  6:39 PM Full Code [id number]  Megan Bellows, MD ED   03/25/2012  4:06 AM 04/01/2012  8:39 PM Full Code [id number]  Megan Orem, RN Inpatient      Other Directives:None  Symptom Management:   Pain: No c/o pain or non-verbal s/s of pain . Continue to monitor and reevaluate after family meeting  Palliative Prophylaxis:   Aspiration, Bowel Regimen, Delirium Protocol, Frequent Pain Assessment and Turn Reposition  Psycho-social/Spiritual:  Support System: Adequate Desire for further Chaplaincy support:no   Prognosis: Would qualify for  hospice if it is her wish to forgo artifical feeding  Discharge Planning: TBD   Chief Complaint/ Primary Diagnoses: Present on Admission:  . Acute encephalopathy . Status epilepticus (Megan [name]) . Pressure ulcer . Endotracheally intubated . Acute respiratory failure (Megan [name]) . Cerebral thrombosis with cerebral infarction . HCAP (healthcare-associated pneumonia) . Controlled type 2 diabetes mellitus with diabetic nephropathy (Megan [name]) . Acute ischemic multifocal multiple vascular territories stroke Megan [name])  I have reviewed Megan medical record,  interviewed Megan patient and family, and examined Megan patient. Megan following aspects are pertinent.  Past Medical History  Diagnosis Date  . Hypertension   . Diastolic dysfunction, left ventricle by ECHO 2011 03/25/2012  . Hypothyroidism 03/25/2012  . High cholesterol   . Type II diabetes mellitus (HCC)   . H/O hiatal hernia   . Duodenal perforation (HCC) 03/23/12  . Arthritis     "in my back"  . GERD (gastroesophageal reflux disease)   . Dementia    Social  History   Social History  . Marital Status: Widowed    Spouse Name: N/A  . Number of Children: 9  . Years of Education: N/A   Occupational History  . Retired    Social History Main Topics  . Smoking status: Never Smoker   . Smokeless tobacco: Current User    Types: Snuff     Comment: 03/28/12 "use snuff q once in while; not regular"  . Alcohol Use: No  . Drug Use: No  . Sexual Activity: Not Currently   Other Topics Concern  . None   Social History Narrative   [name] is married.  Her religion is [employer].  She   does not use alcohol or illegal drugs.  She lives at home with her   husband.      Her daughter and granddaughter are involved.  Megan patient has been Megan primary caretaker for her husband who is disabled with bilateral lower extremity amputations.  She has been using a Quadra-Ped cane but does not have a walker at home.            Family History  Problem Relation Age of Onset  . Hypertension Mother   . Hypertension Father   . Colon cancer Neg Hx   . Pancreatic cancer Neg Hx    Scheduled Meds: . antiseptic oral rinse  7 mL Mouth Rinse QID  . aspirin  300 mg Rectal Daily  . chlorhexidine gluconate  15 mL Mouth Rinse BID  . feeding supplement (PRO-STAT SUGAR FREE 64)  30 mL Oral Daily  . heparin subcutaneous  5,000 Units Subcutaneous 3 times per day  . insulin aspart  0-9 Units Subcutaneous 6 times per day  . latanoprost  1 drop Both Eyes QHS  . levETIRAcetam  1,000 mg Intravenous Q12H  . levothyroxine  25 mcg Intravenous Daily  . pantoprazole sodium  40 mg Per Tube QHS   Continuous Infusions: . feeding supplement (JEVITY 1.2 CAL) 1,000 mL (11/30/15 0607)   PRN Meds:.sodium chloride, hydrALAZINE, sodium chloride Medications Prior to Admission:  Prior to Admission medications   Medication Sig Start Date End Date Taking? Authorizing Provider  amLODipine (NORVASC) 10 MG tablet Take 1 tablet (10 mg total) by mouth daily. 11/19/15  Yes Calvert Cantor, MD    aspirin 325 MG EC tablet Take 325 mg by mouth daily.   Yes Historical Provider, MD  Cholecalciferol (VITAMIN D3) 5000 UNITS CAPS Take 5,000 Units by mouth daily.   Yes Historical Provider, MD  clorazepate (TRANXENE-T) 3.75 MG tablet Take 1 tablet (3.75 mg total) by mouth 3 (three) times daily. 12/19/14  Yes Quentin Mulling, PA-C  donepezil (ARICEPT) 10 MG tablet TAKE 1 TABLET (10 MG TOTAL) BY MOUTH DAILY. 05/08/15  Yes Lucky Cowboy, MD  feeding supplement, ENSURE ENLIVE, (ENSURE ENLIVE) LIQD Take 237 mLs by mouth 3 (three) times daily between meals. 11/18/15  Yes Calvert Cantor, MD  haloperidol (HALDOL) 1 MG tablet Take 1 to 3 tablets/day  if needed to control Agitation - need office visit before refill 10/20/15  Yes Lucky Cowboy, MD  KLOR-CON M20 20 MEQ tablet TAKE 1 TABLET BY MOUTH TWICE A DAY 09/09/15  Yes Lucky Cowboy, MD  levothyroxine (SYNTHROID, LEVOTHROID) 50 MCG tablet Take 1 tablet (50 mcg total) by mouth daily before breakfast. 12/21/14  Yes Lucky Cowboy, MD  lisinopril (PRINIVIL,ZESTRIL) 40 MG tablet TAKE 1 TABLET (40 MG TOTAL) BY MOUTH DAILY. 09/02/15  Yes Lucky Cowboy, MD  megestrol (MEGACE) 40 MG tablet Take 1 tablet (40 mg total) by mouth 2 (two) times daily. 11/18/15  Yes Calvert Cantor, MD  pantoprazole (PROTONIX) 40 MG tablet TAKE 1 TABLET DAILY TO PREVENT ULCER 12/19/14  Yes Quentin Mulling, PA-C  TRAVATAN Z 0.004 % SOLN ophthalmic solution Place 1 drop into both eyes at bedtime.  04/13/14  Yes Historical Provider, MD   Allergies  Allergen Reactions  . Cardura [Doxazosin] Other (See Comments)  . Nsaids Other (See Comments)    unknown    Review of Systems  Unable to perform ROS: Mental status change    Physical Exam  Constitutional:  Frail acutely ill elderly female  Cardiovascular: Normal rate and regular rhythm.   Neurological:  somnolent Speech dysarthric  Skin: Skin is warm and dry.  Psychiatric:  constricted    Vital Signs: BP 149/63 mmHg  Pulse 71   Temp(Src) 98.3 F (36.8 C) (Axillary)  Resp 16  Ht 5\' 2"  (1.575 m)  Wt 55.6 kg (122 lb 9.2 oz)  BMI 22.41 kg/m2  SpO2 100%  SpO2: SpO2: 100 % O2 Device:SpO2: 100 % O2 Flow Rate: .O2 Flow Rate (L/min): 3 L/min  IO: Intake/output summary:  Intake/Output Summary (Last 24 hours) at 11/30/15 1545 Last data filed at 11/30/15 0900  Gross per 24 hour  Intake  482.5 ml  Output    915 ml  Net -432.5 ml    LBM: Last BM Date: 11/27/15 Baseline Weight: Weight: 49.1 kg (108 lb 3.9 oz) Most recent weight: Weight: 55.6 kg (122 lb 9.2 oz)      Palliative Assessment/Data:  Flowsheet Rows        Most Recent Value   Intake Tab    Referral Department  Hospitalist   Unit at Time of Referral  Intermediate Care Unit   Palliative Care Primary Diagnosis  Neurology   Date Notified  11/29/15   Palliative Care Type  New Palliative care   Date of Admission  11/19/15   Date first seen by Palliative Care  11/30/15   # of days Palliative referral response time  1 Day(s)   # of days IP prior to Palliative referral  10   Clinical Assessment    Palliative Performance Scale Score  20%   Pain Max last 24 hours  Not able to report   Pain Min Last 24 hours  Not able to report   Dyspnea Max Last 24 Hours  Not able to report   Dyspnea Min Last 24 hours  Not able to report   Nausea Max Last 24 Hours  Not able to report   Nausea Min Last 24 Hours  Not able to report   Anxiety Max Last 24 Hours  Not able to report   Anxiety Min Last 24 Hours  Not able to report   Other Max Last 24 Hours  Not able to report   Psychosocial & Spiritual Assessment    Palliative Care Outcomes    Palliative Care follow-up planned  Yes, Facility  Additional Data Reviewed:  CBC:    Component Value Date/Time   WBC 6.1 11/30/2015 0333   HGB 10.7* 11/30/2015 0333   HCT 33.4* 11/30/2015 0333   HCT 44.9 11/21/2014 1858   PLT 235 11/30/2015 0333   MCV 92.0 11/30/2015 0333   NEUTROABS 7.1 11/19/2015 0930   LYMPHSABS 1.6  11/19/2015 0930   MONOABS 0.4 11/19/2015 0930   EOSABS 0.0 11/19/2015 0930   BASOSABS 0.0 11/19/2015 0930   Comprehensive Metabolic Panel:    Component Value Date/Time   NA 138 11/30/2015 0333   K 4.0 11/30/2015 0333   CL 103 11/30/2015 0333   CO2 29 11/30/2015 0333   BUN 15 11/30/2015 0333   CREATININE 0.45 11/30/2015 0333   CREATININE 0.65 12/19/2014 1541   GLUCOSE 87 11/30/2015 0333   CALCIUM 9.0 11/30/2015 0333   AST 17 11/19/2015 0930   ALT 8* 11/19/2015 0930   ALKPHOS 67 11/19/2015 0930   BILITOT 0.8 11/19/2015 0930   PROT 7.4 11/19/2015 0930   ALBUMIN 1.9* 11/30/2015 0333     Time In: 1500 Time Out: 1550 Time Total: 50 min Greater than 50%  of this time was spent counseling and coordinating care related to Megan above assessment and plan.Spoke to pt's son, and family meeting scheduled for 1100 am 12/01/15  Signed by: Dory Horn, NP  Dory Horn, NP  11/30/2015, 3:45 PM  Please contact Palliative Medicine Team phone at [phone] for questions and concerns.

## 2015-12-01 ENCOUNTER — Inpatient Hospital Stay (HOSPITAL_COMMUNITY): Payer: Medicare Other

## 2015-12-01 DIAGNOSIS — I159 Secondary hypertension, unspecified: Secondary | ICD-10-CM | POA: Diagnosis present

## 2015-12-01 LAB — GLUCOSE, CAPILLARY
GLUCOSE-CAPILLARY: 135 mg/dL — AB (ref 65–99)
GLUCOSE-CAPILLARY: 117 mg/dL — AB (ref 65–99)
GLUCOSE-CAPILLARY: 143 mg/dL — AB (ref 65–99)
Glucose-Capillary: 119 mg/dL — ABNORMAL HIGH (ref 65–99)
Glucose-Capillary: 124 mg/dL — ABNORMAL HIGH (ref 65–99)
Glucose-Capillary: 137 mg/dL — ABNORMAL HIGH (ref 65–99)

## 2015-12-01 LAB — RENAL FUNCTION PANEL
ANION GAP: 8 (ref 5–15)
Albumin: 1.9 g/dL — ABNORMAL LOW (ref 3.5–5.0)
BUN: 14 mg/dL (ref 6–20)
CHLORIDE: 101 mmol/L (ref 101–111)
CO2: 30 mmol/L (ref 22–32)
Calcium: 9 mg/dL (ref 8.9–10.3)
Creatinine, Ser: 0.46 mg/dL (ref 0.44–1.00)
GFR calc Af Amer: >60 mL/min (ref >60)
GFR calc non Af Amer: >60 mL/min (ref >60)
GLUCOSE: 132 mg/dL — AB (ref 65–99)
POTASSIUM: 3.8 mmol/L (ref 3.5–5.1)
Phosphorus: 2.6 mg/dL (ref 2.5–4.6)
Sodium: 139 mmol/L (ref 135–145)

## 2015-12-01 LAB — CBC
HEMATOCRIT: 33.9 % — AB (ref 36.0–46.0)
Hemoglobin: 10.8 g/dL — ABNORMAL LOW (ref 12.0–15.0)
MCH: 29.4 pg (ref 26.0–34.0)
MCHC: 31.9 g/dL (ref 30.0–36.0)
MCV: 92.4 fL (ref 78.0–100.0)
Platelets: 241 10*3/uL (ref 150–400)
RBC: 3.67 MIL/uL — ABNORMAL LOW (ref 3.87–5.11)
RDW: 14.9 % (ref 11.5–15.5)
WBC: 6.2 10*3/uL (ref 4.0–10.5)

## 2015-12-01 LAB — MAGNESIUM: Magnesium: 2 mg/dL (ref 1.7–2.4)

## 2015-12-01 NOTE — Consult Note (Signed)
Consultation Note Date: 12/01/2015   Patient Name: Megan [name]  DOB: 1927/04/24  MRN: [id number]  Age / Sex: 82 y.o., female  PCP: Megan Pinto, MD Referring Physician: Allie Bossier, MD  Reason for Consultation: Establishing goals of care    Clinical Assessment/Narrative: Pt is a 82 yo female who was just dc'd to SNF after being treated for UTI on 1/16 and readmitted with AMS 1/17 with AMS again. She was found to have another UTI, as well as PNA and multi-focal CVA's, and advanced cerebral atrophy. Pt has PMH of dementia and family was able to articulate that they had seen a decline at home prior to becoming ill: quit walking, minimal PO intake, not speaking.  Contacts/Participants in Discussion: Primary Decision Maker: Megan [name] Relationship to Patient son HCPOA: yes  Met with sons: Megan [name], daughters: Megan [name], Megan [name], and  Megan. [name]  SUMMARY OF RECOMMENDATION Continue NG for TF for 2 more days  Attempt official swallow evaluation so family can see degree of dysphagia and recommendations Would allow comfort feeds after that No PEG as per her advanced directive which is on her chart. I showed all family members. Do understand the risk of aspiration with comfort feeds Transfer to in-pt hospice when medically ready   Code Status/Advance Care Planning: DNR    Code Status Orders        Start     Ordered   11/24/15 0944  Do not attempt resuscitation (DNR)   Continuous     11/24/15 0943    Code Status History    Date Active Date Inactive Code Status Order ID Comments User Context   11/19/2015 11:52 AM 11/24/2015  9:43 AM Full Code [id number]  Megan Colace, NP ED   11/16/2015 12:27 AM 11/18/2015  9:24 PM Full Code [id number]  Megan Quill, DO ED   11/21/2014  6:06 PM 11/22/2014  9:26 PM Full Code [id number]  Megan Eva, MD  Inpatient   08/25/2013  6:11 PM 08/29/2013  5:18 PM Full Code [id number]  Megan Cellar, MD Inpatient   07/17/2013  9:50 AM 07/18/2013  4:58 PM Full Code [id number]  Megan Filler, MD Inpatient   02/27/2013  5:09 PM 03/02/2013  9:31 PM Full Code [id number]  Megan June Leap, MD Inpatient   04/27/2012  5:45 PM 05/02/2012  6:39 PM Full Code [id number]  Megan Bellows, MD ED   03/25/2012  4:06 AM 04/01/2012  8:39 PM Full Code [id number]  Megan Orem, RN Inpatient      Other Directives:None  Symptom Management:   Pain: Tylenol for mild to moderate pain. Order low dose MS04 concentrate PRN for acute pain. Pt is opoid naive  Dyspnea: Continue 02 and supportive pulmonary treatments. Low dose MS04 concentrate available for acute symptoms  Palliative Prophylaxis:   Aspiration, Delirium Protocol, Frequent Pain Assessment, Oral Care and Turn Reposition  Additional Recommendations (Limitations, Scope, Preferences):  Treat the treatable  Not full comfort care yet but trending this way   Psycho-social/Spiritual:  Support System: Strong Desire for further Chaplaincy support:no Additional Recommendations: Grief/Bereavement Support  Prognosis: < 4 weeks  Discharge Planning: Likely in-pt hospice when medically ready unless she has a profound rebound. Still monitoring   Chief Complaint/ Primary Diagnoses: Present on Admission:  . Acute encephalopathy . Status epilepticus (Megan [name]) . Pressure ulcer . Endotracheally intubated . Acute respiratory failure (Megan [name]) . Cerebral thrombosis with cerebral infarction . HCAP (healthcare-associated pneumonia) . Controlled type  2 diabetes mellitus with diabetic nephropathy (Megan [name]) . Acute ischemic multifocal multiple vascular territories stroke (Megan [name]) . Palliative care encounter  I have reviewed the medical record, interviewed the patient and family, and examined the patient. The following aspects are pertinent.  Past Medical History  Diagnosis Date  . Hypertension   .  Diastolic dysfunction, left ventricle by ECHO 2011 03/25/2012  . Hypothyroidism 03/25/2012  . High cholesterol   . Type II diabetes mellitus (Megan [name])   . H/O hiatal hernia   . Duodenal perforation (Megan [name]) 03/23/12  . Arthritis     "in my back"  . GERD (gastroesophageal reflux disease)   . Dementia    Social History   Social History  . Marital Status: Widowed    Spouse Name: N/A  . Number of Children: 51  . Years of Education: N/A   Occupational History  . Retired    Social History Main Topics  . Smoking status: Never Smoker   . Smokeless tobacco: Current User    Types: Snuff     Comment: 03/28/12 "use snuff q once in while; not regular"  . Alcohol Use: No  . Drug Use: No  . Sexual Activity: Not Currently   Other Topics Concern  . None   Social History Narrative   Ms. Megan [name] is married.  Her religion is [employer].  She   does not use alcohol or illegal drugs.  She lives at home with her   husband.      Her daughter and granddaughter are involved.  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  She has been using a Quadra-Ped cane but does not have a walker at home.            Family History  Problem Relation Age of Onset  . Hypertension Mother   . Hypertension Father   . Colon cancer Neg Hx   . Pancreatic cancer Neg Hx    Scheduled Meds: . antiseptic oral rinse  7 mL Mouth Rinse QID  . aspirin  300 mg Rectal Daily  . chlorhexidine gluconate  15 mL Mouth Rinse BID  . feeding supplement (PRO-STAT SUGAR FREE 64)  30 mL Oral Daily  . heparin subcutaneous  5,000 Units Subcutaneous 3 times per day  . insulin aspart  0-9 Units Subcutaneous 6 times per day  . latanoprost  1 drop Both Eyes QHS  . levETIRAcetam  1,000 mg Intravenous Q12H  . levothyroxine  25 mcg Intravenous Daily  . pantoprazole sodium  40 mg Per Tube QHS   Continuous Infusions: . feeding supplement (JEVITY 1.2 CAL) 1,000 mL (11/30/15  2010)   PRN Meds:.sodium chloride, hydrALAZINE, sodium chloride Medications Prior to Admission:  Prior to Admission medications   Medication Sig Start Date End Date Taking? Authorizing Provider  amLODipine (NORVASC) 10 MG tablet Take 1 tablet (10 mg total) by mouth daily. 11/19/15  Yes Debbe Odea, MD  aspirin 325 MG EC tablet Take 325 mg by mouth daily.   Yes Historical Provider, MD  Cholecalciferol (VITAMIN D3) 5000 UNITS CAPS Take 5,000 Units by mouth daily.   Yes Historical Provider, MD  clorazepate (TRANXENE-T) 3.75 MG tablet Take 1 tablet (3.75 mg total) by mouth 3 (three) times daily. 12/19/14  Yes Vicie Mutters, PA-C  donepezil (ARICEPT) 10 MG tablet TAKE 1 TABLET (10 MG TOTAL) BY MOUTH DAILY. 05/08/15  Yes Megan Pinto, MD  feeding supplement, ENSURE ENLIVE, (ENSURE ENLIVE) LIQD Take 237  mLs by mouth 3 (three) times daily between meals. 11/18/15  Yes Debbe Odea, MD  haloperidol (HALDOL) 1 MG tablet Take 1 to 3 tablets/day if needed to control Agitation - need office visit before refill 10/20/15  Yes Megan Pinto, MD  KLOR-CON M20 20 MEQ tablet TAKE 1 TABLET BY MOUTH TWICE A DAY 09/09/15  Yes Megan Pinto, MD  levothyroxine (SYNTHROID, LEVOTHROID) 50 MCG tablet Take 1 tablet (50 mcg total) by mouth daily before breakfast. 12/21/14  Yes Megan Pinto, MD  lisinopril (PRINIVIL,ZESTRIL) 40 MG tablet TAKE 1 TABLET (40 MG TOTAL) BY MOUTH DAILY. 09/02/15  Yes Megan Pinto, MD  megestrol (MEGACE) 40 MG tablet Take 1 tablet (40 mg total) by mouth 2 (two) times daily. 11/18/15  Yes Debbe Odea, MD  pantoprazole (PROTONIX) 40 MG tablet TAKE 1 TABLET DAILY TO PREVENT ULCER 12/19/14  Yes Vicie Mutters, PA-C  TRAVATAN Z 0.004 % SOLN ophthalmic solution Place 1 drop into both eyes at bedtime.  04/13/14  Yes Historical Provider, MD   Allergies  Allergen Reactions  . Cardura [Doxazosin] Other (See Comments)  . Nsaids Other (See Comments)    unknown    Review of Systems  Unable to  perform ROS: Acuity of condition    Physical Exam  Constitutional:  Cachetic frail elderly female who appears acutely ill  Respiratory: Effort normal.  Neurological:  somnalent  Skin: Skin is warm and dry.    Vital Signs: BP 154/75 mmHg  Pulse 87  Temp(Src) 97.2 F (36.2 C) (Axillary)  Resp 16  Ht '5\' 2"'$  (1.575 m)  Wt 52.8 kg (116 lb 6.5 oz)  BMI 21.28 kg/m2  SpO2 98%  SpO2: SpO2: 98 % O2 Device:SpO2: 98 % O2 Flow Rate: .O2 Flow Rate (L/min): 3 L/min  IO: Intake/output summary:  Intake/Output Summary (Last 24 hours) at 12/01/15 1420 Last data filed at 12/01/15 1230  Gross per 24 hour  Intake 1667.08 ml  Output   2200 ml  Net -532.92 ml    LBM: Last BM Date: 12/01/15 Baseline Weight: Weight: 49.1 kg (108 lb 3.9 oz) Most recent weight: Weight: 52.8 kg (116 lb 6.5 oz)      Palliative Assessment/Data:  Flowsheet Rows        Most Recent Value   Intake Tab    Referral Department  Hospitalist   Unit at Time of Referral  Intermediate Care Unit   Palliative Care Primary Diagnosis  Neurology   Date Notified  11/29/15   Palliative Care Type  New Palliative care   Date of Admission  11/19/15   Date first seen by Palliative Care  12/01/15   # of days Palliative referral response time  2 Day(s)   # of days IP prior to Palliative referral  10   Clinical Assessment    Palliative Performance Scale Score  20%   Pain Max last 24 hours  Not able to report   Pain Min Last 24 hours  Not able to report   Dyspnea Max Last 24 Hours  Not able to report   Dyspnea Min Last 24 hours  Not able to report   Nausea Max Last 24 Hours  Not able to report   Nausea Min Last 24 Hours  Not able to report   Anxiety Max Last 24 Hours  Not able to report   Anxiety Min Last 24 Hours  Not able to report   Other Max Last 24 Hours  Not able to report   Psychosocial & Spiritual  Assessment    Palliative Care Outcomes    Patient/Family meeting held?  Yes   Who was at the meeting?  2 sons, Broadus John and  Engineer, maintenance, sisters: Fredirick Lathe, Tereasa Coop Degraffengriedt   Palliative Care Outcomes  Provided psychosocial or spiritual support, Clarified goals of care, Counseled regarding hospice   Patient/Family wishes: Interventions discontinued/not started   Mechanical Ventilation, BiPAP, NIPPV, Trach, Hemodialysis, PEG   Palliative Care follow-up planned  Yes, Facility      Additional Data Reviewed:  CBC:    Component Value Date/Time   WBC 6.2 12/01/2015 0543   HGB 10.8* 12/01/2015 0543   HCT 33.9* 12/01/2015 0543   HCT 44.9 11/21/2014 1858   PLT 241 12/01/2015 0543   MCV 92.4 12/01/2015 0543   NEUTROABS 7.1 11/19/2015 0930   LYMPHSABS 1.6 11/19/2015 0930   MONOABS 0.4 11/19/2015 0930   EOSABS 0.0 11/19/2015 0930   BASOSABS 0.0 11/19/2015 0930   Comprehensive Metabolic Panel:    Component Value Date/Time   NA 139 12/01/2015 0543   K 3.8 12/01/2015 0543   CL 101 12/01/2015 0543   CO2 30 12/01/2015 0543   BUN 14 12/01/2015 0543   CREATININE 0.46 12/01/2015 0543   CREATININE 0.65 12/19/2014 1541   GLUCOSE 132* 12/01/2015 0543   CALCIUM 9.0 12/01/2015 0543   AST 17 11/19/2015 0930   ALT 8* 11/19/2015 0930   ALKPHOS 67 11/19/2015 0930   BILITOT 0.8 11/19/2015 0930   PROT 7.4 11/19/2015 0930   ALBUMIN 1.9* 12/01/2015 0543     Time In: 1100 Time Out: 1230 Time Total: 90 min Greater than 50%  of this time was spent counseling and coordinating care related to the above assessment and plan. Staffed with Dr. Sherral Hammers  Signed by: Dory Horn, NP  Dory Horn, NP  12/01/2015, 2:20 PM  Please contact Palliative Medicine Team phone at [phone] for questions and concerns.

## 2015-12-01 NOTE — Progress Notes (Signed)
[facility] TEAM 1 - Stepdown/ICU TEAM Progress Note  Megan [name] AVW:[id number] DOB: 07-Jan-1927 DOA: 11/19/2015 PCP: [name], MD  Admit HPI / Brief Narrative: 82 year-old BF PMHx Dementia HTN, Diastolic Dysfunction, HLD, Diabetes Type 2, Hypothyroidism  Recent pan sensitive E coli UTI admitted with altered mental status, hypertensive crisis. CXR consistent with PNA. EEG showing status epilepticus. MRI showing stroke with dominant infarcts possibly embolic from an unknown source.   HPI/Subjective: 1/29 A/O 0, will follow some commands, will attempt to answer some questions.  Assessment/Plan: Acute Encephalopathy/Multiple Stroke  - MRI 1/20 showing multiple acute strokes. Fentanyl 1 dose 1/17 & Propofol 1/17 -1/20. At present, she opens eyes on command. She is occasionally answering questions by saying "yes" but is slow to respond. Some movement of extremities on verbal command.  - Continue Aspirin 300 mg suppository  - PT/OT recommend SNF   Status Epiepticus -  -Seen on EEG 1/17.  - Keppra 1gm IV q12hr - Neurology has signed off  Acute Respiratory Failure with hypoxia-  -Secondary to CVA & inability to protect airway.  -1/27 failed swallow study; NPO  - Continue Incentive Spirometry - Continue oxygen supplementation to keep O2 saturation >92%  HCAP  - Treated with Vancomycin (1/17-1/19) and Zosyn (1/17-1/20). Blood cx (1/17) negative. - Finished course of antibiotics.  - Plan to re-culture for fever  E coli UTI -  -Ctx on 1/13 w/ E. Coli. Repeat Ctx on 1/17 w/ insignificant growth. - Finished course of antibiotics.  - Plan to re-culture for fever  DM Type 2 -  -Blood glucose controlled. A1c 6.4.  -Continue Accu-Checks q4hr - Low Dose SSI   Hypothyroidism - Continue Synthroid  IV daily  Normocytic anemia  - no evidence of active bleeding. Hgb stable at 9.9. - Trending Hgb daily with CBC  Severe Protein Calorie Malnutrition -    -discontinued tube feeds on 1/25 after extubation. Patient failed her swallow eval this morning. Her son/ HCPOA  Does wish to re-initiate tube feeds for the patient. Family's understanding is if patient does not recover significant ability to protect airway and eat on her own within 3-5 days NG tube would be withdrawn per her wishes as stated in her living will -NG tube placed after patient pulled out CorTrak tube  and patient started on Jevity 1.2 CAL at 50ml/hr  Hx of GERD - Continue Protonix IV daily  Hypertensive Emergency  - IV Hydralazine 10 mg q4 prn SBP >165 or DBP >105  Tachycardia - Resolved  Electrolyte abnormalities  - WNL    Diminished urine output  - Strict in and out since admission +9.3 L  - Cr  WNL  Diet  - Jevity 1.5 CAL  Goals of Care - 1/27Consult to Palliative Care Pt w/ Multi Stroke bilat. Poor prognoses. Discuss short term vs Long term options if Pt does not turn around -Palliative care meeting on 1/28;Per chart review family desiring feeding via [place] and time to see how much she can recover before making another plan Planning to meet son, Megan [name] at 1100 am 12/01/15    Code Status: DO NOT RESUSCITATE Family Communication: Family present at time of exam Disposition Plan: Will await palliative care meeting    Consultants: Dr.Jennings E Nestor [facility] Dr.David L Rinehuls stroke team   Procedure/Significant Events: 1/17 - intubated & started on labetalol for HTN 1/17 EEG;-sharply contoured, at times periodic activity in the left temporoparietal region.-Duration of faster frequencies on the right 1/18 -  Obtunded off sedation. Started on Keppra for Seizures by Neurology. 1/19 - Continues to be obtunded & bradycardic with HR in 40s-50s. 1/21 - MRI with bilateral CVA & family made aware.  1/23 - Eyes opening but not following commands.  1/25 - One-Way Extubation to Nasal Cannula. Tube feeds stopped.   STUDIES:  1/17 CT Head: No  acute finding. Extensive white matter disease  1/17 EEG : Focal status epilepticus  1/18 TTE : LV normal in size w/ EF 55-60% & grade 1 diastolic dysfunction. PASP . Moderate aortic regurg. No PFO. 1/20 MRI : Acute nonhemorrhagic LEFT ACA territory infarction affecting much of the medial LEFT frontal lobe. Thrombosis of the LEFT anterior cerebral artery is suspected. Acute LEFT parieto-occipital boundary zone infarct between the LEFT MCA and PCA vascular territories. Multifocal subcentimeter infarction RIGHT parieto-occipital cortex and white matter, probably also representing boundary zone infarction, versus shower of emboli. Advanced atrophy and small vessel disease. 1/22 CXR : Persistent LLL opacity.   Culture Blood Ctx x2 (1/17): Negative Urine Ctx (1/17): 1000 colonies, insignificant  Antibiotics: Zosyn 1/17- 1/20 Vancomycin 1/17 - 1/19  DVT prophylaxis: Subcutaneous heparin   Devices    LINES / TUBES:  Right IJ CVL 1/17>>> OETT 1/17>>> OGT 1/17>>> Foley Catheter 1/17>>> PIV x1    Continuous Infusions: . feeding supplement (JEVITY 1.2 CAL) 1,000 mL (12/01/15 1800)    Objective: VITAL SIGNS: Temp: 98.3 F (36.8 C) (01/29 2018) Temp Source: Oral (01/29 2018) BP: 153/73 mmHg (01/29 2018) Pulse Rate: 87 (01/29 2018) SPO2; FIO2:   Intake/Output Summary (Last 24 hours) at 12/01/15 2032 Last data filed at 12/01/15 2000  Gross per 24 hour  Intake    850 ml  Output   1800 ml  Net   -950 ml     Exam: General: A/O 0, follows some commands, answers simple questions , No acute respiratory distress Eyes: Negative headache, ,negative scleral hemorrhage ENT: Negative Runny nose, negative gingival bleeding, Neck:  Negative scars, masses, torticollis, lymphadenopathy, JVD Lungs: Clear to auscultation bilaterally without wheezes or crackles Cardiovascular: Regular rate and rhythm without murmur gallop or rub normal S1 and S2 Abdomen:negative abdominal pain,  nondistended, positive soft, bowel sounds, no rebound, no ascites, no appreciable mass Extremities: No significant cyanosis, clubbing, or edema bilateral lower extremities Psychiatric: Unable to fully assess secondary to altered mental status  Neurologic:  Unable to fully assess secondary to altered mental status. With June all extremities from painful stimuli. Moved bilateral upper extremities to command i.e. squeezed fingers lifted arm.  Data Reviewed: Basic Metabolic Panel:  Recent Labs Lab 11/27/15 0504 11/28/15 0430 11/28/15 0441 11/29/15 0420 11/30/15 0333 12/01/15 0543  NA 140  --  138 135 138 139  K 3.4*  --  3.7 3.4* 4.0 3.8  CL 97*  --  98* 99* 103 101  CO2 30  --  32 GLUCOSE 145*  --  114* 129* 87 132*  BUN 17  --  CREATININE 0.46  --  0.46 0.48 0.45 0.46  CALCIUM 8.3*  --  8.9 9.2 9.0 9.0  MG 1.7 1.8  --  1.8 2.1 2.0  PHOS 2.7  --  3.0 2.9 2.8 2.6   Liver Function Tests:  Recent Labs Lab 11/27/15 0504 11/28/15 0441 11/29/15 0420 11/30/15 0333 12/01/15 0543  ALBUMIN 1.7* 1.8* 2.0* 1.9* 1.9*   No results for input(s): LIPASE, AMYLASE in the last 168 hours. No results for input(s): AMMONIA in  the last 168 hours. CBC:  Recent Labs Lab 11/27/15 0504 11/28/15 0430 11/29/15 0420 11/30/15 0333 12/01/15 0543  WBC 8.6 6.5 7.3 6.1 6.2  HGB 9.4* 9.9* 11.4* 10.7* 10.8*  HCT 29.7* 31.3* 34.3* 33.4* 33.9*  MCV 92.0 92.6 90.7 92.0 92.4  PLT 168 189 214 235 241   Cardiac Enzymes: No results for input(s): CKTOTAL, CKMB, CKMBINDEX, TROPONINI in the last 168 hours. BNP (last 3 results) No results for input(s): BNP in the last 8760 hours.  ProBNP (last 3 results) No results for input(s): PROBNP in the last 8760 hours.  CBG:  Recent Labs Lab 12/01/15 0002 12/01/15 0510 12/01/15 0828 12/01/15 1227 12/01/15 1640  GLUCAP 137* 124* 135* 117* 119*    No results found for this or any previous visit (from the past 240 hour(s)).    Studies:  Recent x-ray studies have been reviewed in detail by the Attending Physician  Scheduled Meds:  Scheduled Meds: . antiseptic oral rinse  7 mL Mouth Rinse QID  . aspirin  300 mg Rectal Daily  . chlorhexidine gluconate  15 mL Mouth Rinse BID  . feeding supplement (PRO-STAT SUGAR FREE 64)  30 mL Oral Daily  . heparin subcutaneous  5,000 Units Subcutaneous 3 times per day  . insulin aspart  0-9 Units Subcutaneous 6 times per day  . latanoprost  1 drop Both Eyes QHS  . levETIRAcetam  1,000 mg Intravenous Q12H  . levothyroxine  25 mcg Intravenous Daily  . pantoprazole sodium  40 mg Per Tube QHS    Time spent on care of this patient: 40 mins   WOODS, Roselind Messier , MD  [facility] Office  [phone] Pager - [phone]  On-Call/Text Page:      [email or web address]      password [email or web address]  If 7PM-7AM, please contact night-coverage [email or web address] Password [email or web address] 12/01/2015, 8:32 PM   LOS: 12 days   Care during the described time interval was provided by me .  I have reviewed this patient's available data, including medical history, events of note, physical examination, and all test results as part of my evaluation. I have personally reviewed and interpreted all radiology studies.   Carolyne Littles, MD [phone] Pager

## 2015-12-02 LAB — GLUCOSE, CAPILLARY
GLUCOSE-CAPILLARY: 120 mg/dL — AB (ref 65–99)
GLUCOSE-CAPILLARY: 151 mg/dL — AB (ref 65–99)
GLUCOSE-CAPILLARY: 129 mg/dL — AB (ref 65–99)
Glucose-Capillary: 101 mg/dL — ABNORMAL HIGH (ref 65–99)
Glucose-Capillary: 139 mg/dL — ABNORMAL HIGH (ref 65–99)
Glucose-Capillary: 169 mg/dL — ABNORMAL HIGH (ref 65–99)

## 2015-12-02 MED ORDER — LEVOTHYROXINE SODIUM 50 MCG PO TABS
50.0000 ug | ORAL_TABLET | Freq: Every day | ORAL | Status: DC
  Administered 2015-12-03: 50 ug
  Filled 2015-12-02: qty 1

## 2015-12-02 MED ORDER — LEVOTHYROXINE SODIUM 50 MCG PO TABS: 50.0000 ug | ORAL_TABLET | Freq: Every day | ORAL | Status: DC

## 2015-12-02 MED ORDER — ASPIRIN 325 MG PO TABS
ORAL_TABLET | ORAL | Status: AC
  Administered 2015-12-02: 325 mg
  Filled 2015-12-02: qty 1

## 2015-12-02 MED ORDER — LEVETIRACETAM 100 MG/ML PO SOLN
1000.0000 mg | Freq: Two times a day (BID) | ORAL | Status: DC
  Administered 2015-12-02 – 2015-12-03 (×3): 1000 mg
  Filled 2015-12-02 (×5): qty 10

## 2015-12-02 MED ORDER — ASPIRIN 325 MG PO TABS
325.0000 mg | ORAL_TABLET | Freq: Every day | ORAL | Status: DC
  Administered 2015-12-02 – 2015-12-03 (×2): 325 mg
  Filled 2015-12-02: qty 1

## 2015-12-02 MED ORDER — LEVETIRACETAM 100 MG/ML PO SOLN
1000.0000 mg | Freq: Two times a day (BID) | ORAL | Status: DC
  Filled 2015-12-02: qty 10

## 2015-12-02 MED ORDER — ASPIRIN 325 MG PO TABS: 325.0000 mg | ORAL_TABLET | Freq: Every day | ORAL | Status: DC

## 2015-12-02 NOTE — Progress Notes (Signed)
[place] TEAM 1 - Stepdown/ICU TEAM PROGRESS NOTE  Megan [name]:[id number] DOB: 1927-07-06 DOA: 11/19/2015 PCP: [name], MD  Admit HPI / Brief Narrative: 82 year-old F Hx Dementia, HTN, Diastolic Dysfunction, HLD, DM2, and Hypothyroidism who suffered a recent pan sensitive E coli UTI who was admitted 11/19/15 with altered mental status, and a hypertensive crisis. CXR consistent with PNA. EEG noted status epilepticus. MRI showing stroke with dominant infarcts possibly embolic from an unknown source.   HPI/Subjective: The patient is resting comfortably in bed.  She will answer very simple questions.  She does not appear to be uncomfortable.  She denies chest pain shortness breath fevers chills nausea or vomiting.  Assessment/Plan:  Acute Encephalopathy - multiple infarcts  -MRI 1/20 noted multiple acute strokes -Continue Aspirin   -PT/OT recommend SNF   Status Epiepticus  -noted on EEG 1/17 -Keppra 1gm IV q12hr -Neurology has signed off -No clinical evidence of ongoing seizures at this time  Acute Respiratory Failure with hypoxia - resolved -Secondary to CVA & inability to protect airway  -1/27 failed swallow study >  Cortrak placed   HCAP - resolved  -Treated with Vancomycin (1/17-1/19) and Zosyn (1/17-1/20) - Blood cx (1/17) negative -No persisting clinical signs or symptoms to suggest pulmonary infection  E coli UTI - resolved  -Cx on 1/13 w/ E. Coli - repeat on 1/17 w/ insignificant growth -Finished course of antibiotics.   DM2   -A1c 6.4 - CBG currently well-controlled  Hypothyroidism -Continue Synthroid   Normocytic anemia  -no evidence of active bleeding - Hgb has been stable  Severe Protein Calorie Malnutrition  -discontinued tube feeds on 1/25 after extubation - patient failed f/u swallow eval - her son / HCPOAdid wish to re-initiate tube feeds for the patient (Family's understanding is if patient does not recover significant  ability to protect airway and eat on her own within 3-5 days NG tube would be withdrawn per her wishes as stated in her living will)  Hx of GERD -Continue Protonix   Hypertensive Emergency  -BP now reasonably controlled   Tachycardia -Resolved  Goals of Care -Palliative Care following w/ [place] and meeting w/ family   Code Status: DNR Family Communication: no family present at time of exam Disposition Plan: ultimate plan to be determined after trial of tube feeding completed   Consultants: Palliative Care Neurology  PCCM   Procedures: 1/17 - intubated & started on labetalol for HTN 1/17 EEG - sharply contoured, at times periodic activity in the left temporoparietal region - duration of faster frequencies on the right 1/18 - Obtunded off sedation. Started on Keppra for Seizures by Neurology. 1/19 - Continues to be obtunded & bradycardic with HR in 40s-50s. 1/21 - MRI with bilateral CVA & family made aware 1/23 - Eyes opening but not following commands  1/25 - One-Way Extubation to Nasal Cannula. Tube feeds stopped.   Antibiotics: Zosyn 1/17- 1/20 Vancomycin 1/17 - 1/19  DVT prophylaxis: SQ heparin   Objective: Blood pressure 156/65, pulse 62, temperature 98.5 F (36.9 C), temperature source Oral, resp. rate 15, height  (1.575 m), weight 53.6 kg (118 lb 2.7 oz), SpO2 100 %.  Intake/Output Summary (Last 24 hours) at 12/02/15 1529 Last data filed at 12/02/15 1500  Gross per 24 hour  Intake   1460 ml  Output   1035 ml  Net    425 ml   Exam: General: No acute respiratory distress evident  Lungs: Clear to auscultation bilaterally without wheezes  or crackles Cardiovascular: Regular rate and rhythm without murmur gallop or rub normal S1 and S2 Abdomen: Nontender, nondistended, soft, bowel sounds positive, no rebound, no ascites, no appreciable mass Extremities: No significant cyanosis, clubbing, or edema bilateral lower extremities  Data Reviewed:  Basic  Metabolic Panel:  Recent Labs Lab 11/27/15 0504 11/28/15 0430 11/28/15 0441 11/29/15 0420 11/30/15 0333 12/01/15 0543  NA 140  --  138 135 138 139  K 3.4*  --  3.7 3.4* 4.0 3.8  CL 97*  --  98* 99* 103 101  CO2 30  --  32 GLUCOSE 145*  --  114* 129* 87 132*  BUN 17  --  CREATININE 0.46  --  0.46 0.48 0.45 0.46  CALCIUM 8.3*  --  8.9 9.2 9.0 9.0  MG 1.7 1.8  --  1.8 2.1 2.0  PHOS 2.7  --  3.0 2.9 2.8 2.6    CBC:  Recent Labs Lab 11/27/15 0504 11/28/15 0430 11/29/15 0420 11/30/15 0333 12/01/15 0543  WBC 8.6 6.5 7.3 6.1 6.2  HGB 9.4* 9.9* 11.4* 10.7* 10.8*  HCT 29.7* 31.3* 34.3* 33.4* 33.9*  MCV 92.0 92.6 90.7 92.0 92.4  PLT 168 189 214 235 241    Liver Function Tests:  Recent Labs Lab 11/27/15 0504 11/28/15 0441 11/29/15 0420 11/30/15 0333 12/01/15 0543  ALBUMIN 1.7* 1.8* 2.0* 1.9* 1.9*   CBG:  Recent Labs Lab 12/01/15 2023 12/02/15 0025 12/02/15 0359 12/02/15 0822 12/02/15 1209  GLUCAP 143* 169* 120* 151* 101*    Studies:   Recent x-ray studies have been reviewed in detail by the Attending Physician  Scheduled Meds:  Scheduled Meds: . antiseptic oral rinse  7 mL Mouth Rinse QID  . aspirin  325 mg Per Tube Daily  . chlorhexidine gluconate  15 mL Mouth Rinse BID  . feeding supplement (PRO-STAT SUGAR FREE 64)  30 mL Oral Daily  . heparin subcutaneous  5,000 Units Subcutaneous 3 times per day  . insulin aspart  0-9 Units Subcutaneous 6 times per day  . latanoprost  1 drop Both Eyes QHS  . levETIRAcetam  1,000 mg Oral BID  . [START ON 12/03/2015] levothyroxine  50 mcg Oral QAC breakfast  . pantoprazole sodium  40 mg Per Tube QHS    Time spent on care of this patient: 25 mins   Katrina Brosh T , MD   [facility] Office  [phone] Pager - Text Page per Loretha Stapler as per below:  On-Call/Text Page:      [email or web address]      password [email or web address]  If 7PM-7AM, please contact night-coverage [email or web address] Password  [email or web address] 12/02/2015, 3:29 PM   LOS: 13 days

## 2015-12-02 NOTE — Progress Notes (Signed)
Occupational Therapy Treatment Patient Details Name: Megan [name] MRN: [id number] DOB: 22-Sep-1927 Today's Date: 12/02/2015    History of present illness 82 y.o. female intubated 11/19/15-11/27/15 extubated MRI(+) L ACA, L frontal infarct L parieto- occipital AMS HTN PES Syndrome. Pt now with L heel pressure wound and sacrum wound Stage III PMH: h/o dementia, HTN, DM II, Arthritis, GERD,    OT comments  Attempted OT treatment, pt needing total hand over hand assistance with washing face.  Resistant to therapist assisting and pt not following any commands at this time.  When therapist asked questions regarding orientation pt would only repeat the question in a statement, and would not answer it.  She attempted to pull at NG tube with the LUE throughout session.  Restraints re-applied at end of session.    Follow Up Recommendations  SNF;Supervision/Assistance - 24 hour    Equipment Recommendations  Hospital bed       Precautions / Restrictions Precautions Precautions: Fall Precaution Comments: hx of dementia Restrictions Weight Bearing Restrictions: No              ADL Overall ADL's : Needs assistance/impaired     Grooming: Wash/dry face;Total assistance                                 General ADL Comments: Pt not following any one step commands for simple grooming task.  Pushed against therapist when attempting to assist pt with hand over hand to wash her face.  When therapist would ask her questions she would just repeat back the question in a statement, but not make any attempt to answer it.  throughout session pt attempted to use her LUE to try and remove her NG tube.  No understanding of re-direction when therapist attempted.  Finished session with stretches to her neck in rotation to the right and lateral flexion.  Pt keeps head tilted to the left majority of the time.  Moderate tightness in the left upper trap and levator during stretching.                   Cognition   Behavior During Therapy: Flat affect Overall Cognitive Status: History of cognitive impairments - at baseline (Pt not following any one step commands)                                    Pertinent Vitals/ Pain       Pain Assessment: Faces Faces Pain Scale: No hurt         Frequency Min 2X/week     Progress Toward Goals  OT Goals(current goals can now be found in the care plan section)  Progress towards OT goals: OT to reassess next treatment;Not progressing toward goals - comment     Plan Discharge plan remains appropriate       End of Session     Activity Tolerance Other (comment) (limited secondary to pt not being able to actively participate secondary to cognition)   Patient Left in bed;with call bell/phone within reach;with restraints reapplied   Nurse Communication Mobility status        Time: 1215-1227 OT Time Calculation (min): 12 min  Charges: OT General Charges $OT Visit: 1 Procedure OT Treatments $Self Care/Home Management : 8-22 mins  Ledon Weihe OTR/L 12/02/2015, 12:58 PM

## 2015-12-02 NOTE — Progress Notes (Signed)
Speech Language Pathology Treatment: Dysphagia  Patient Details Name: Megan [name] MRN: [id number] DOB: Aug 21, 1927 Today's Date: 12/02/2015 Time: 2130-8657 SLP Time Calculation (min) (ACUTE ONLY): 19 min  Assessment / Plan / Recommendation Clinical Impression  Pt exhibits little change from prior ST sessions. Required total verbal and tactile assist for feeding. Minimal acceptance from spoon (sometimes typical of pt's with dementia) and oral holding despite tactile and verbal cues using dry spoon to assist swallow transit. Swallow initiation with any consistency is questionable (1 weak swallow x 1 out of 6 attempts ) requiring oral suctioning for all boluses. Pt unable to orally sustain nutrition from safety or quality standpoint at this time. Per MD note pt may be transitioning to comfort feeds at which time Dys 1, thin liquids recommended. ST will check status mid week.   HPI HPI: 82 year-old with recent pan sensitive E coli UTI admitted with altered mental status, hypertensive crisis. CXR consistent with PNA. Marland Kitchen MRI showing stroke with dominant infarcts possibly embolic from an unknown source.  LEFT ACA territory infarction affecting much of the medial LEFT frontal lobe. Thrombosis of the LEFT anterior cerebral artery is suspected. Acute LEFT parieto-occipital boundary zone infarct between the LEFT MCA and PCA vascular territories. Multifocal subcentimeter infarction RIGHT parieto-occipital cortex and white matter, probably also representing boundary zone infarction, versus shower of emboli. Advanced atrophy and small vessel disease. Pt intubated from 1/17 -1/25.       SLP Plan  Continue with current plan of care     Recommendations  Diet recommendations: NPO Medication Administration: Via alternative means             Oral Care Recommendations: Oral care QID Follow up Recommendations: Skilled Nursing facility Plan: Continue with current plan of care     GO                 Royce Macadamia 12/02/2015, 10:25 AM  Breck Coons Lonell Face.Ed [employer] [phone]

## 2015-12-02 NOTE — Care Management Important Message (Signed)
Important Message  Patient Details  Name: Megan [name] MRN: [id number] Date of Birth: 07/02/1927   Medicare Important Message Given:  Yes    Kyla Balzarine 12/02/2015, 2:18 PM

## 2015-12-03 DIAGNOSIS — I161 Hypertensive emergency: Secondary | ICD-10-CM | POA: Diagnosis present

## 2015-12-03 DIAGNOSIS — E038 Other specified hypothyroidism: Secondary | ICD-10-CM | POA: Diagnosis present

## 2015-12-03 LAB — GLUCOSE, CAPILLARY
GLUCOSE-CAPILLARY: 128 mg/dL — AB (ref 65–99)
GLUCOSE-CAPILLARY: 134 mg/dL — AB (ref 65–99)
GLUCOSE-CAPILLARY: 165 mg/dL — AB (ref 65–99)
Glucose-Capillary: 127 mg/dL — ABNORMAL HIGH (ref 65–99)
Glucose-Capillary: 129 mg/dL — ABNORMAL HIGH (ref 65–99)
Glucose-Capillary: 87 mg/dL (ref 65–99)

## 2015-12-03 MED ORDER — ACETAMINOPHEN 650 MG RE SUPP
650.0000 mg | RECTAL | Status: DC | PRN
  Administered 2015-12-03: 650 mg via RECTAL
  Filled 2015-12-03: qty 1

## 2015-12-03 NOTE — Progress Notes (Signed)
[place] TEAM 1 - Stepdown/ICU TEAM Progress Note  Megan [name] VWU:[id number] DOB: 02/27/1927 DOA: 11/19/2015 PCP: [name], MD  Admit HPI / Brief Narrative: 82 year-old BF PMHx Dementia HTN, Diastolic Dysfunction, HLD, Diabetes Type 2, Hypothyroidism  Recent pan sensitive E coli UTI admitted with altered mental status, hypertensive crisis. CXR consistent with PNA. EEG showing status epilepticus. MRI showing stroke with dominant infarcts possibly embolic from an unknown source.   HPI/Subjective: 1/ 31 A/O 0, will not follow commands, will answer simple questions such as are you in pain,are you SOB.  Assessment/Plan: Acute Encephalopathy/Multiple Stroke  - MRI 1/20 showing multiple acute strokes. Fentanyl 1 dose 1/17 & Propofol 1/17 -1/20. At present, she opens eyes on command. She is occasionally answering questions by saying "yes" but is slow to respond. Will not move extremities to command - Continue Aspirin 325 mg - PT/OT recommend SNF   Status Epiepticus -  -Seen on EEG 1/17.  - Keppra 1gm IV q12hr - Neurology has signed off  Acute Respiratory Failure with hypoxia-  -Secondary to CVA & inability to protect airway.  -1/27 failed swallow study - Continue Incentive Spirometry - Continue oxygen supplementation to keep O2 saturation >92%  HCAP  - Treated with Vancomycin (1/17-1/19) and Zosyn (1/17-1/20). Blood cx (1/17) negative. - Finished course of antibiotics.  - Plan to re-culture for fever  E coli UTI -  -Ctx on 1/13 w/ E. Coli. Repeat Ctx on 1/17 w/ insignificant growth. - Finished course of antibiotics.  - Plan to re-culture for fever  DM Type 2 -  -Blood glucose controlled. A1c 6.4.  -Continue Accu-Checks q4hr - Low Dose SSI   Hypothyroidism - Continue Synthroid   daily  Normocytic anemia  - no evidence of active bleeding. Hgb stable at 9.9. - Trending Hgb daily with CBC  Severe Protein Calorie Malnutrition -  -discontinued  tube feeds on 1/25 after extubation. Patient failed her swallow eval this morning. Her son/ HCPOA  Does wish to re-initiate tube feeds for the patient. Family's understanding is if patient does not recover significant ability to protect airway and eat on her own within 3-5 days NG tube would be withdrawn per her wishes as stated in her living will -NG tube placed after patient pulled out CorTrak tube  and patient started on Jevity 1.2 CAL at 97ml/hr -1/31 requested modified barium swallow study for 2/1@@@@  Hx of GERD - Continue Protonix 40 mg daily  Hypertensive Emergency  - IV Hydralazine 10 mg q4 prn SBP >165 or DBP >105  Tachycardia - Resolved  Electrolyte abnormalities  - WNL    Diminished urine output  - Strict in and out since admission + 9.6 L  - Cr  WNL  Diet  - Jevity 1.5 CAL    Goals of Care - 1/27Consult to Palliative Care Pt w/ Multi Stroke bilat. Poor prognoses. Discuss short term vs Long term options if Pt does not turn around -Palliative care meeting on 1/28;Per chart review family desiring feeding via [place] and time to see how much she can recover before making another plan Planning to meet son, Karma Lew at 1100 am 12/01/15    Code Status: DO NOT RESUSCITATE Family Communication: Family present at time of exam Disposition Plan: Will await palliative care meeting    Consultants: Dr.Jennings E Nestor [facility] M Dr.David L Rinehuls stroke team   Procedure/Significant Events: 1/17 - intubated & started on labetalol for HTN 1/17 EEG;-sharply contoured, at times periodic activity in  the left temporoparietal region.-Duration of faster frequencies on the right 1/18 - Obtunded off sedation. Started on Keppra for Seizures by Neurology. 1/19 - Continues to be obtunded & bradycardic with HR in 40s-50s. 1/21 - MRI with bilateral CVA & family made aware.  1/23 - Eyes opening but not following commands.  1/25 - One-Way Extubation to Nasal Cannula. Tube  feeds stopped.   STUDIES:  1/17 CT Head: No acute finding. Extensive white matter disease  1/17 EEG : Focal status epilepticus  1/18 TTE : LV normal in size w/ EF 55-60% & grade 1 diastolic dysfunction. PASP . Moderate aortic regurg. No PFO. 1/20 MRI : Acute nonhemorrhagic LEFT ACA territory infarction affecting much of the medial LEFT frontal lobe. Thrombosis of the LEFT anterior cerebral artery is suspected. Acute LEFT parieto-occipital boundary zone infarct between the LEFT MCA and PCA vascular territories. Multifocal subcentimeter infarction RIGHT parieto-occipital cortex and white matter, probably also representing boundary zone infarction, versus shower of emboli. Advanced atrophy and small vessel disease. 1/22 CXR : Persistent LLL opacity.   Culture Blood Ctx x2 (1/17): Negative Urine Ctx (1/17): 1000 colonies, insignificant  Antibiotics: Zosyn 1/17- 1/20 Vancomycin 1/17 - 1/19  DVT prophylaxis: Subcutaneous heparin   Devices    LINES / TUBES:  Right IJ CVL 1/17>>> OETT 1/17>>> OGT 1/17>>> Foley Catheter 1/17>>> PIV x1    Continuous Infusions: . feeding supplement (JEVITY 1.2 CAL) 1,000 mL (12/02/15 1452)    Objective: VITAL SIGNS: Temp: 99.2 F (37.3 C) (01/31 0800) Temp Source: Axillary (01/31 0800) BP: 157/65 mmHg (01/31 1000) Pulse Rate: 58 (01/31 1000) SPO2; FIO2:   Intake/Output Summary (Last 24 hours) at 12/03/15 1024 Last data filed at 12/03/15 1000  Gross per 24 hour  Intake   1440 ml  Output    790 ml  Net    650 ml     Exam: General: A/O 0, does not follow commands, answers simple questions , No acute respiratory distress Eyes: Negative headache, ,negative scleral hemorrhage ENT: Negative Runny nose, negative gingival bleeding, Neck:  Negative scars, masses, torticollis, lymphadenopathy, JVD Lungs: Clear to auscultation bilaterally without wheezes or crackles Cardiovascular: Regular rate and rhythm without murmur gallop or  rub normal S1 and S2 Abdomen:negative abdominal pain, nondistended, positive soft, bowel sounds, no rebound, no ascites, no appreciable mass Extremities: No significant cyanosis, clubbing, or edema bilateral lower extremities Psychiatric: Unable to fully assess secondary to altered mental status  Neurologic:  Unable to fully assess secondary to altered mental status. With June all extremities from painful stimuli. Moved bilateral upper extremities to command i.e. squeezed fingers lifted arm.  Data Reviewed: Basic Metabolic Panel:  Recent Labs Lab 11/27/15 0504 11/28/15 0430 11/28/15 0441 11/29/15 0420 11/30/15 0333 12/01/15 0543  NA 140  --  138 135 138 139  K 3.4*  --  3.7 3.4* 4.0 3.8  CL 97*  --  98* 99* 103 101  CO2 30  --  32 GLUCOSE 145*  --  114* 129* 87 132*  BUN 17  --  CREATININE 0.46  --  0.46 0.48 0.45 0.46  CALCIUM 8.3*  --  8.9 9.2 9.0 9.0  MG 1.7 1.8  --  1.8 2.1 2.0  PHOS 2.7  --  3.0 2.9 2.8 2.6   Liver Function Tests:  Recent Labs Lab 11/27/15 0504 11/28/15 0441 11/29/15 0420 11/30/15 0333 12/01/15 0543  ALBUMIN 1.7* 1.8* 2.0* 1.9* 1.9*   No results for  input(s): LIPASE, AMYLASE in the last 168 hours. No results for input(s): AMMONIA in the last 168 hours. CBC:  Recent Labs Lab 11/27/15 0504 11/28/15 0430 11/29/15 0420 11/30/15 0333 12/01/15 0543  WBC 8.6 6.5 7.3 6.1 6.2  HGB 9.4* 9.9* 11.4* 10.7* 10.8*  HCT 29.7* 31.3* 34.3* 33.4* 33.9*  MCV 92.0 92.6 90.7 92.0 92.4  PLT 168 189 214 235 241   Cardiac Enzymes: No results for input(s): CKTOTAL, CKMB, CKMBINDEX, TROPONINI in the last 168 hours. BNP (last 3 results) No results for input(s): BNP in the last 8760 hours.  ProBNP (last 3 results) No results for input(s): PROBNP in the last 8760 hours.  CBG:  Recent Labs Lab 12/02/15 1609 12/02/15 2015 12/03/15 12/03/15 0414 12/03/15 0818  GLUCAP 129* 139* 127* 128* 129*    No results found for this or any  previous visit (from the past 240 hour(s)).   Studies:  Recent x-ray studies have been reviewed in detail by the Attending Physician  Scheduled Meds:  Scheduled Meds: . antiseptic oral rinse  7 mL Mouth Rinse QID  . aspirin  325 mg Per Tube Daily  . chlorhexidine gluconate  15 mL Mouth Rinse BID  . heparin subcutaneous  5,000 Units Subcutaneous 3 times per day  . insulin aspart  0-9 Units Subcutaneous 6 times per day  . latanoprost  1 drop Both Eyes QHS  . levETIRAcetam  1,000 mg Per Tube BID  . levothyroxine  50 mcg Per Tube QAC breakfast  . pantoprazole sodium  40 mg Per Tube QHS    Time spent on care of this patient: 40 mins   Bernie Ransford, Roselind Messier , MD  [facility] Office  [phone] Pager - [phone]  On-Call/Text Page:      [email or web address]      password [email or web address]  If 7PM-7AM, please contact night-coverage [email or web address] Password [email or web address] 12/03/2015, 10:24 AM   LOS: 14 days   Care during the described time interval was provided by me .  I have reviewed this patient's available data, including medical history, events of note, physical examination, and all test results as part of my evaluation. I have personally reviewed and interpreted all radiology studies.   Carolyne Littles, MD [phone] Pager

## 2015-12-03 NOTE — Progress Notes (Signed)
PT Cancellation Note  Patient Details Name: Megan [name] MRN: [id number] DOB: [birth year]   Cancelled Treatment:    Reason Eval/Treat Not Completed: Other (comment). Per case management pt's family deciding on comfort care via inpatient hospice today or tomorrow. Pt with no further acute PT needs at this time. PT signing off.   Marcene Brawn 12/03/2015, 2:35 PM   Lewis Shock, PT, DPT Pager #: [phone] Office #: [phone]

## 2015-12-03 NOTE — Progress Notes (Signed)
CSW informed of family decision to transition pt to residential hospice facility- first choice is [facility].  CSW made referral to [facility] place hospital coordinator  CSW will continue to follow  Merlyn Lot, [facility] Clinical Social Worker [phone]

## 2015-12-03 NOTE — Progress Notes (Signed)
Speech Language Pathology Dysphagia Treatment Patient Details Name: Megan [name] MRN: [id number] DOB: 01-12-1927 Today's Date: 12/03/2015 Time: 7425-9563 SLP Time Calculation (min) (ACUTE ONLY): 15 min  Assessment / Plan / Recommendation Clinical Impression    SLP provided PO at bedside to determine readiness. Pt exhibited oral holding when given puree and no swallow initiation. Maximum verbal and tactile cues provided throughout session. Ice chips were not accepted from spoon. Pt began pulling NG tube therefore restraints reapplied. Swallow safety significantly impaired at this time, however per MD note pt may transition to comfort feeds. Recommend NPO status until comfort feeds established, at which time Dysphagia 1 diet and thin liquids recommended. SLP will continue to f/u.    Diet Recommendation    NPO unless comfort feeds initiated   SLP Plan Continue with current plan of care      Swallowing Goals     General Behavior/Cognition: Lethargic/Drowsy;Doesn't follow directions;Requires cueing;Impulsive Patient Positioning: Upright in bed Oral care provided: Yes HPI: 82 year-old with recent pan sensitive E coli UTI admitted with altered mental status, hypertensive crisis. CXR consistent with PNA. Marland Kitchen MRI showing stroke with dominant infarcts possibly embolic from an unknown source.  LEFT ACA territory infarction affecting much of the medial LEFT frontal lobe. Thrombosis of the LEFT anterior cerebral artery is suspected. Acute LEFT parieto-occipital boundary zone infarct between the LEFT MCA and PCA vascular territories. Multifocal subcentimeter infarction RIGHT parieto-occipital cortex and white matter, probably also representing boundary zone infarction, versus shower of emboli. Advanced atrophy and small vessel disease. Pt intubated from 1/17 -1/25.   Oral Cavity - Oral Hygiene     Dysphagia Treatment Treatment Methods: Skilled observation Patient observed directly with PO's:  Yes Type of PO's observed: Dysphagia 1 (puree) Feeding: Total assist Liquids provided via: Teaspoon Oral Phase Signs & Symptoms: Oral holding Pharyngeal Phase Signs & Symptoms: Suspected delayed swallow initiation Type of cueing: Verbal;Tactile Amount of cueing: Total   GO     Megan [name] 12/03/2015, 12:00 PM    Megan [name], Student-SLP

## 2015-12-03 NOTE — Progress Notes (Signed)
Follow-up meeting after 2 day trial of N/G tube feeding.  As per speech notes Mrs. Peatross did not exhibit any change in her swallowing/protective airway ability.  Comfort feeds in the context of full comfort care were recommended.  Met with patient son Clyda Greener, reviewed information and discussion that took place with PMT over the weekend.  He verbalizes that he and the family would now like to pursue hospice eligibility with [facility] for [facility] admission.    Patient is lethargic and mostly non-responsive at this time.  She will open her eyes to gentle touch but is unable to provide any meaningful answers to questions or participate in decision making.  Per her Living Will Megan [name] did not wish to have any artificial nutrition or hydration should her condition be deemed terminal.  Son, Broadus John and family will abide by her wishes and provide for comfort and dignity in this likely terminal situation.  Referral request placed through [place].  Will follow with transition as needed.  Kizzie Fantasia, RN-BC, MSN, [facility]

## 2015-12-04 ENCOUNTER — Inpatient Hospital Stay (HOSPITAL_COMMUNITY): Payer: Medicare Other

## 2015-12-04 LAB — BASIC METABOLIC PANEL
ANION GAP: 8 (ref 5–15)
BUN: 18 mg/dL (ref 6–20)
CHLORIDE: 104 mmol/L (ref 101–111)
CO2: 31 mmol/L (ref 22–32)
Calcium: 9.2 mg/dL (ref 8.9–10.3)
Creatinine, Ser: 0.53 mg/dL (ref 0.44–1.00)
GFR calc non Af Amer: >60 mL/min (ref >60)
Glucose, Bld: 118 mg/dL — ABNORMAL HIGH (ref 65–99)
Potassium: 3.7 mmol/L (ref 3.5–5.1)
Sodium: 143 mmol/L (ref 135–145)

## 2015-12-04 LAB — MAGNESIUM: Magnesium: 2 mg/dL (ref 1.7–2.4)

## 2015-12-04 LAB — GLUCOSE, CAPILLARY
GLUCOSE-CAPILLARY: 167 mg/dL — AB (ref 65–99)
Glucose-Capillary: 99 mg/dL (ref 65–99)
Glucose-Capillary: 92 mg/dL (ref 65–99)

## 2015-12-04 MED ORDER — ANTISEPTIC ORAL RINSE SOLUTION (CORINZ): 7.0000 mL | Freq: Four times a day (QID) | OROMUCOSAL | Status: DC | PRN

## 2015-12-04 MED ORDER — ACETAMINOPHEN 650 MG RE SUPP: 650.0000 mg | RECTAL | Status: AC | PRN

## 2015-12-04 MED ORDER — OXYCODONE HCL 5 MG/5ML PO SOLN: 5.0000 mg | ORAL | Status: DC | PRN

## 2015-12-04 MED ORDER — ACETAMINOPHEN 650 MG RE SUPP: 650.0000 mg | RECTAL | Status: DC | PRN

## 2015-12-04 NOTE — Progress Notes (Addendum)
Pt discharged to [facility] place Pt has no belongings. Has Prevelon boot on Lt foot Family aware of transfer per social work. Tried to call report to [facility] but Nurse was busy. Message left for her to call me back. Pt has foley intact upon discharge

## 2015-12-04 NOTE — Progress Notes (Signed)
Speech Language Pathology Discharge Patient Details Name: Megan [name] MRN: [id number] DOB: 03-Jun-1927 Today's Date: 12/04/2015 Time:  -     Patient discharged from SLP services secondary to medical decline - will need to re-order SLP to resume therapy services. No family member available to educate re: oral care. Pt transferring to hospice.  Please see latest therapy progress note for current level of functioning and progress toward goals.    Progress and discharge plan discussed with patient and/or caregiver: Patient unable to participate in discharge planning and no caregivers available  GO     Lynita Lombard 12/04/2015, 9:20 AM   Lynita Lombard, Student-SLP

## 2015-12-04 NOTE — Discharge Summary (Signed)
DISCHARGE SUMMARY  Megan [name]  MR#: [id number]  DOB:04-Sep-1927  Date of Admission: 11/19/2015 Date of Discharge: 12/04/2015  Attending Physician:Brandi Tomlinson T  Patient's WUJ:WJXBJYN,WGNFAOZ DAVID, MD  Consults: Palliative Care Neurology  PCCM    Disposition: D/C to [facility] for comfort focused care  Discharge Diagnoses: Acute Encephalopathy - multiple infarcts  Status Epiepticus  Acute Respiratory Failure with hypoxia - resolved HCAP - resolved  E coli UTI - resolved  DM2  Hypothyroidism Normocytic anemia  Severe Protein Calorie Malnutrition  Hx of GERD Hypertensive Emergency  Tachycardia DNR - NO CODE BLUE   Initial presentation: 82 year-old F Hx Dementia, HTN, Diastolic Dysfunction, HLD, DM2, and Hypothyroidism w/ a recent hx of a pan sensitive E coli UTI who was admitted 11/19/15 with altered mental status, and a hypertensive crisis. CXR was consistent with PNA. EEG noted status epilepticus. MRI noted stroke with dominant infarcts possibly embolic from an unknown source.   Hospital Course:  After a prolonged hospital course, it became clear that Megan [name] was very unlikely to recover to a quality of life she would desire, as specified by her living will.  After a trial of tube feeds failed to spark an improvement, the family compassionately decided to comply with her previously stated wishes, and transitioned her to a comfort care focus.    Acute persisting Encephalopathy - multiple cerebral infarcts  -MRI 1/20 noted multiple acute strokes  Status Epiepticus  -noted on EEG 1/17 -No clinical evidence of ongoing seizures during second portion of the hospital stay   Acute Respiratory Failure with hypoxia - resolved -Secondary to CVA & inability to protect airway  -1/27 failed swallow study  HCAP - resolved  -Treated with Vancomycin (1/17-1/19) and Zosyn (1/17-1/20) - [name] cx (1/17) negative -No persisting clinical signs or symptoms to  suggest pulmonary infection  E coli UTI - resolved  -Cx on 1/13 w/ E. Coli - repeat on 1/17 w/ insignificant growth -Finished course of antibiotics   DM2  -A1c 6.4 - CBG well-controlled  Hypothyroidism  Normocytic anemia  -no evidence of active bleeding - Hgb has been stable  Severe Protein Calorie Malnutrition  -discontinued tube feeds on 1/25 after extubation - patient failed f/u swallow eval - her son / HCPOAdid wish to re-initiate tube feeds - unfortunately the patient did not recover significant ability to protect airway or eat on her own within 3-5 days of NG tube feeds, and therefore it was decided to discontinue artificial feeding as mandated by her living will  Hx of GERD  Hypertensive Emergency  -BP reasonably controlled   Tachycardia -Resolved    Medication List    STOP taking these medications        amLODipine 10 MG tablet  Commonly known as:  NORVASC     aspirin 325 MG EC tablet     clorazepate 3.75 MG tablet  Commonly known as:  TRANXENE-T     donepezil 10 MG tablet  Commonly known as:  ARICEPT     feeding supplement (ENSURE ENLIVE) Liqd     haloperidol 1 MG tablet  Commonly known as:  HALDOL     KLOR-CON M20 20 MEQ tablet  Generic drug:  potassium chloride SA     levothyroxine 50 MCG tablet  Commonly known as:  SYNTHROID, LEVOTHROID     lisinopril 40 MG tablet  Commonly known as:  PRINIVIL,ZESTRIL     megestrol 40 MG tablet  Commonly known as:  MEGACE     pantoprazole 40  MG tablet  Commonly known as:  PROTONIX     TRAVATAN Z 0.004 % Soln ophthalmic solution  Generic drug:  Travoprost (BAK Free)     Vitamin D3 5000 units Caps      TAKE these medications        acetaminophen 650 MG suppository  Commonly known as:  TYLENOL  Place 1 suppository (650 mg total) rectally every 4 (four) hours as needed for fever or mild pain.       Day of Discharge BP 152/58 mmHg  Pulse 54  Temp(Src) 99.2 F (37.3 C) (Axillary)  Resp 15   Ht  (1.575 m)  Wt 56 kg (123 lb 7.3 oz)  BMI 22.57 kg/m2  SpO2 99%  Physical Exam: At the time of d/c the pt appears to be resting comfortably.  She is in no apparent resp distress.  She is sedate/non-communicative.    Basic Metabolic Panel:  Recent Labs Lab 11/28/15 0430 11/28/15 0441 11/29/15 0420 11/30/15 0333 12/01/15 0543 12/04/15 0335  NA  --  138 135 138 139 143  K  --  3.7 3.4* 4.0 3.8 3.7  CL  --  98* 99* 103 101 104  CO2  --  32 GLUCOSE  --  114* 129* 87 132* 118*  BUN  --  CREATININE  --  0.46 0.48 0.45 0.46 0.53  CALCIUM  --  8.9 9.2 9.0 9.0 9.2  MG 1.8  --  1.8 2.1 2.0 2.0  PHOS  --  3.0 2.9 2.8 2.6  --    Liver Function Tests:  Recent Labs Lab 11/28/15 0441 11/29/15 0420 11/30/15 0333 12/01/15 0543  ALBUMIN 1.8* 2.0* 1.9* 1.9*   CBC:  Recent Labs Lab 11/28/15 0430 11/29/15 0420 11/30/15 0333 12/01/15 0543  WBC 6.5 7.3 6.1 6.2  HGB 9.9* 11.4* 10.7* 10.8*  HCT 31.3* 34.3* 33.4* 33.9*  MCV 92.6 90.7 92.0 92.4  PLT 189 214 235 241   CBG:  Recent Labs Lab 12/03/15 1521 12/03/15 2022 12/03/15 2341 12/04/15 0356 12/04/15 0810  GLUCAP 134* 165* 167* 99 92   Time spent in discharge (includes decision making & examination of pt): >35 minutes  12/04/2015, 9:45 AM   Megan Blood, MD [facility] Office  [phone] Pager [phone]  On-Call/Text Page:      [email or web address]      password [facility]

## 2015-12-04 NOTE — Consult Note (Addendum)
[facility] [employer]  Received request from CSW McGrath for family interest in [facility]. Chart reviewed and spoke with son by phone. Plan is to meet him at 10:00 this morning to complete paper work. Will update CSW when paper work complete.   Will need DC summary faxed to [phone].  RN please call report to [phone].  Thank you. Forrestine Him, LCSW [phone]

## 2015-12-04 NOTE — Progress Notes (Signed)
Junius Roads RN Research officer, political party) states she's already removed the central line. Consuello Masse

## 2015-12-04 NOTE — Progress Notes (Signed)
[employer] back for report Spoke to [place] her Nurse.

## 2015-12-04 NOTE — Progress Notes (Addendum)
Patient will discharge to [facility] Anticipated discharge date: 2/1 Transportation by [facility]- called at 11:55am Report #: [id number].  CSW signing off.  Merlyn Lot, LCSWA Clinical Social Worker [phone]

## 2015-12-04 NOTE — Progress Notes (Signed)
Patient pulled out feeding tube. Will not replace now per plan of care, but have notified the attending

## 2016-01-01 DEATH — deceased

## 2016-01-04 ENCOUNTER — Other Ambulatory Visit: Payer: Self-pay | Admitting: Internal Medicine

## 2016-01-24 ENCOUNTER — Other Ambulatory Visit: Payer: Self-pay | Admitting: Physician Assistant

## 2017-02-26 IMAGING — CT CT HEAD W/O CM
1 series · 16 of 30 positions shown, 20 images · non-contrast
Comparison: 11/21/2014

CLINICAL DATA: Weakness and fatigue.  Hypertension and dementia.

EXAM:
CT HEAD WITHOUT CONTRAST
TECHNIQUE: Contiguous axial images were obtained from the base of the skull
through the vertex without intravenous contrast.

[Series 2: head 5.0 h30s · axial · 0.44mm/px · z∈[-95,+40]mm · 16 of 31 slices shown, 20 images]
[im 2/31  brain]
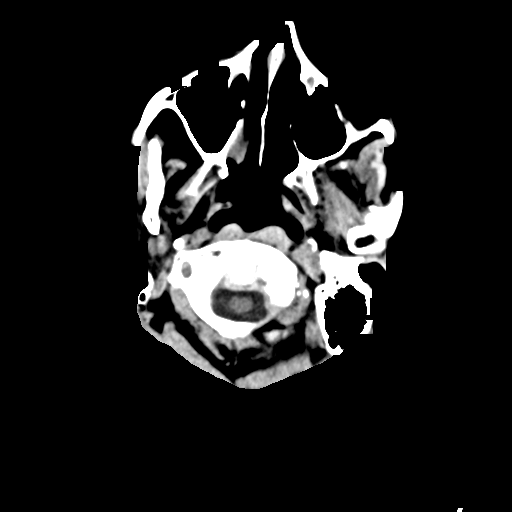
[im 2/31  bone]
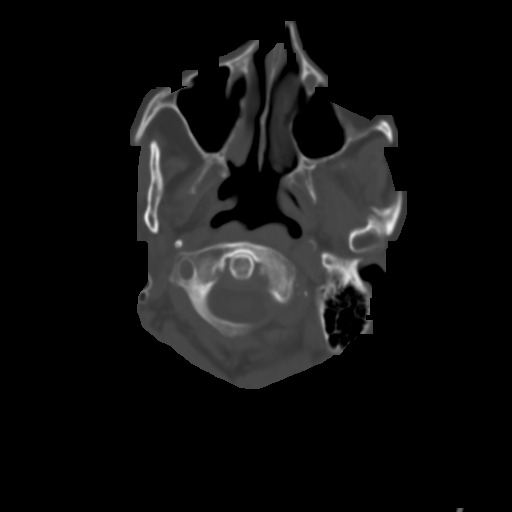
[im 4/31  brain]
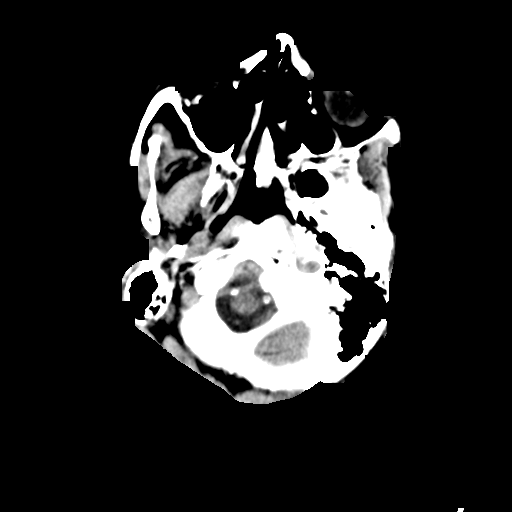
[im 6/31  brain]
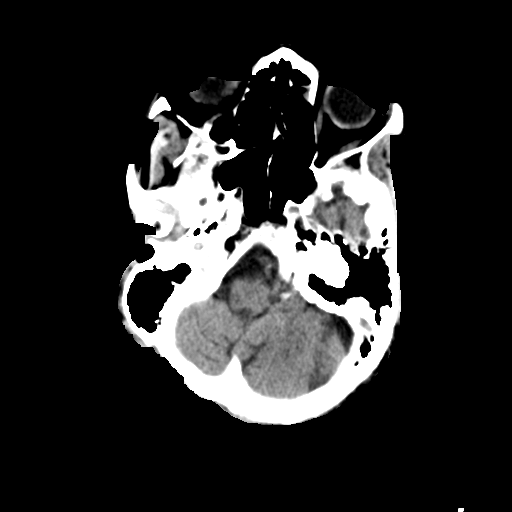
[im 8/31  brain]
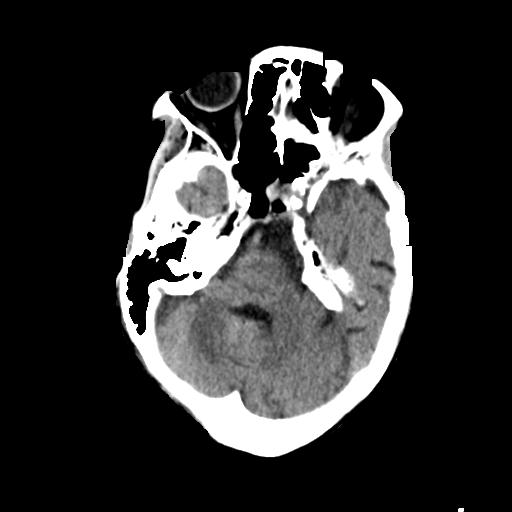
[im 9/31  brain]
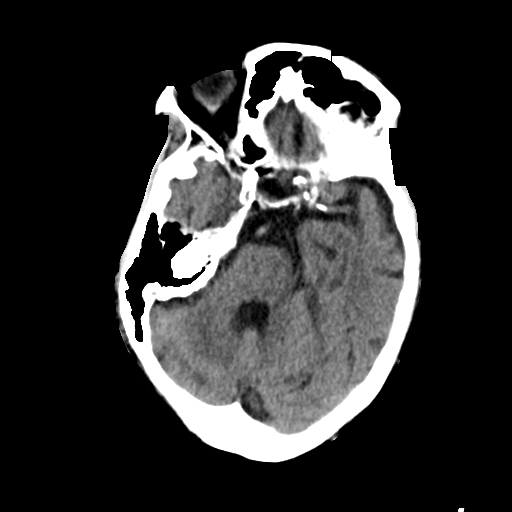
[im 9/31  bone]
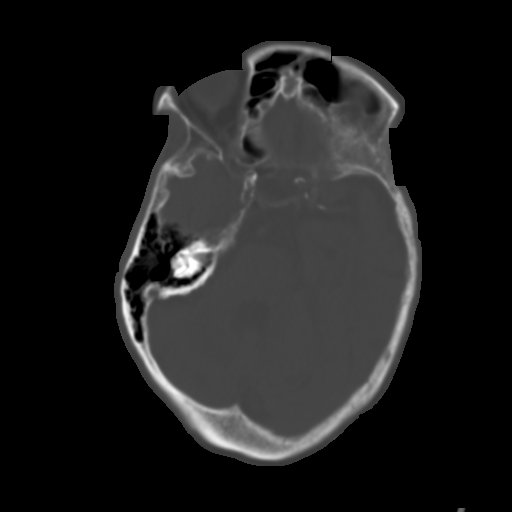
[im 11/31  brain]
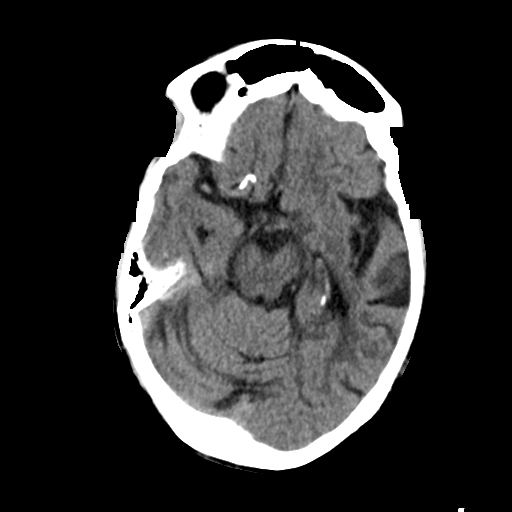
[im 13/31  brain]
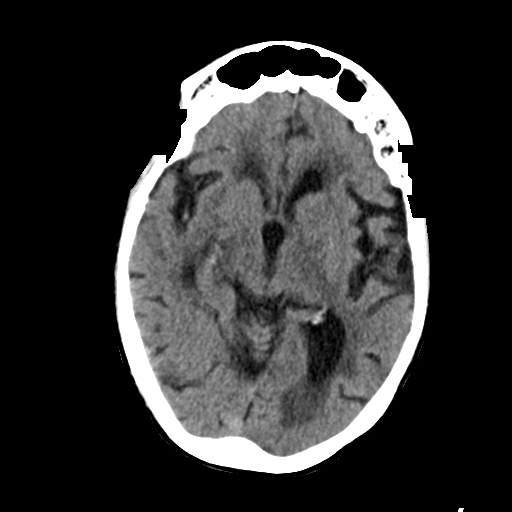
[im 15/31  brain]
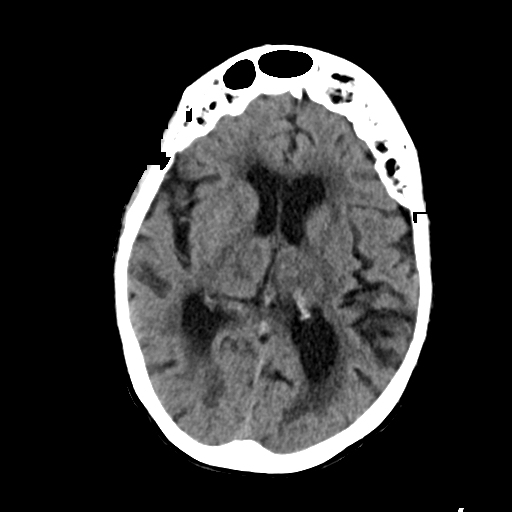
[im 16/31  brain]
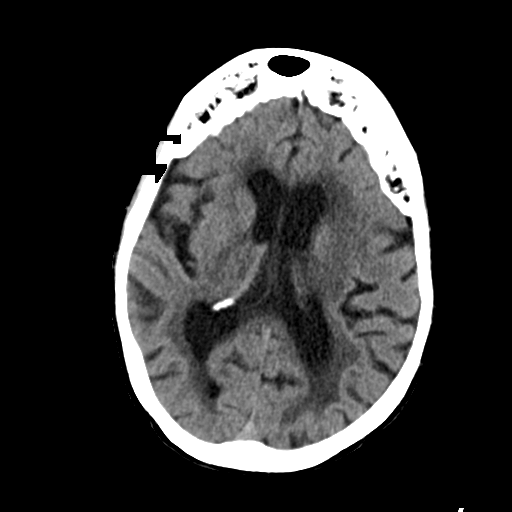
[im 16/31  bone]
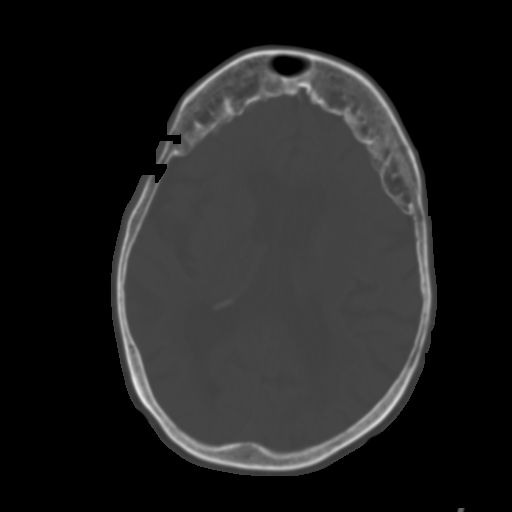
[im 18/31  brain]
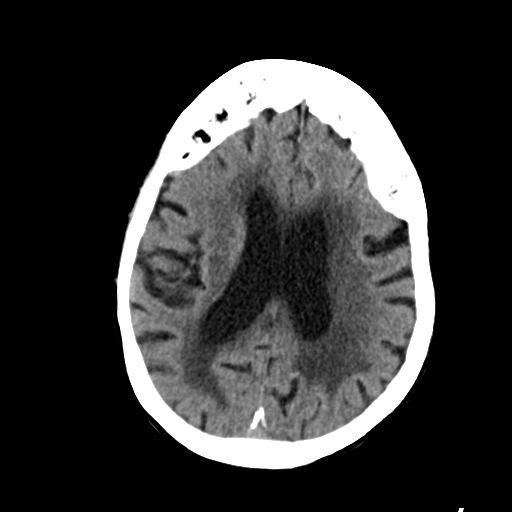
[im 20/31  brain]
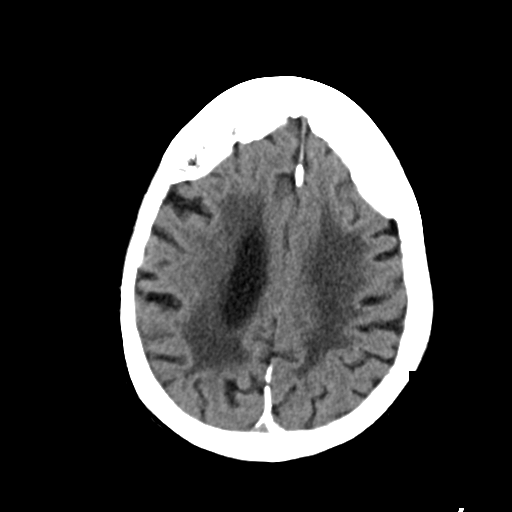
[im 22/31  brain]
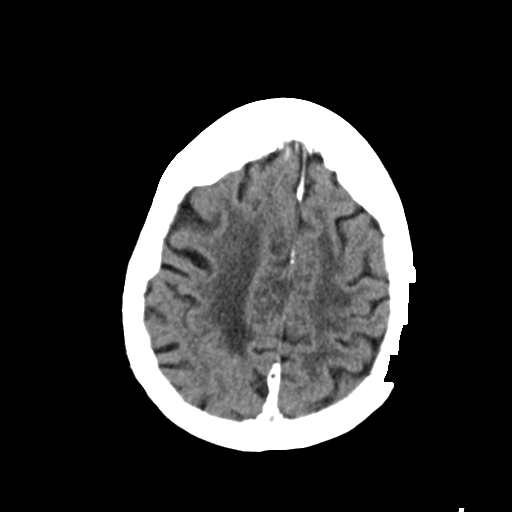
[im 23/31  brain]
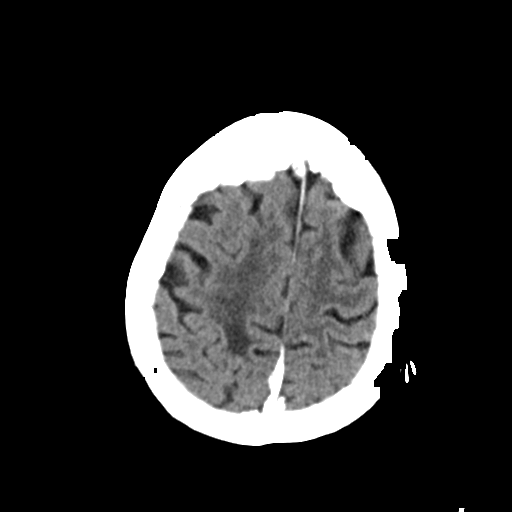
[im 23/31  bone]
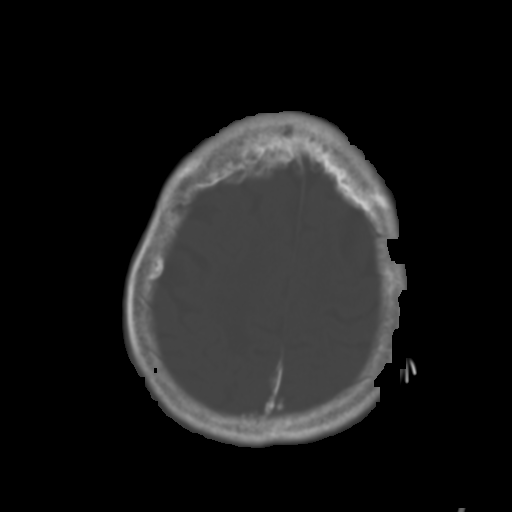
[im 25/31  brain]
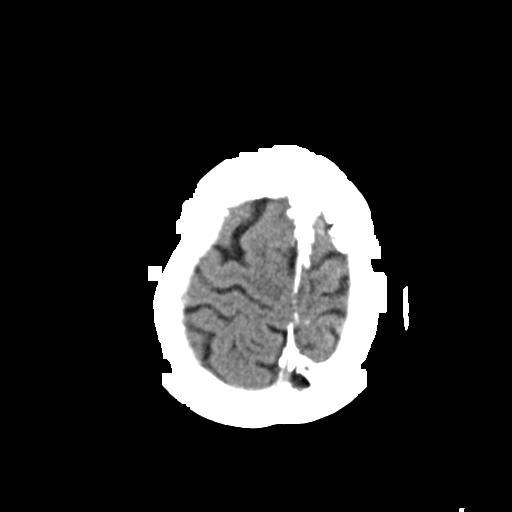
[im 27/31  brain]
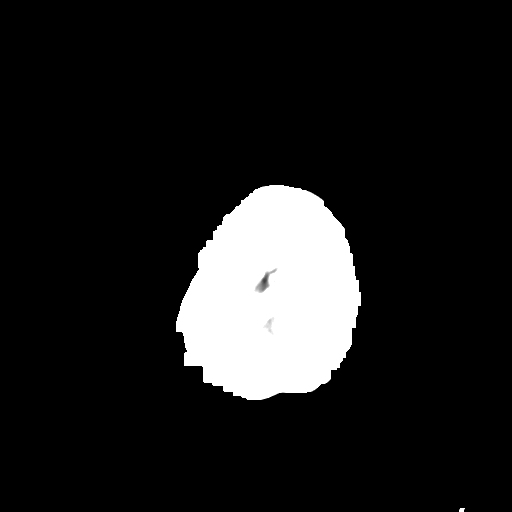
[im 29/31  brain]
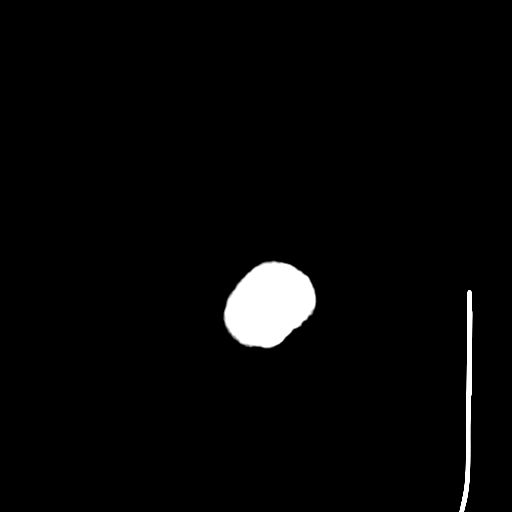

[16 of 30 positions shown; findings below may reference images not displayed]

FINDINGS: There is no evidence of intracranial hemorrhage, brain edema, or
other signs of acute infarction. There is no evidence of
intracranial mass lesion or mass effect. No abnormal extraaxial
fluid collections are identified.

Mild to moderate cerebral atrophy is unchanged. Extensive chronic
small vessel disease shows no significant change. Ventricles stable
in size. No skull abnormality identified. Incidental note is made of
hyperostosis frontalis interna.
IMPRESSION: No acute intracranial findings.

Stable cerebral atrophy and chronic small vessel disease.

## 2017-03-02 IMAGING — DX DG CHEST 1V PORT
1 series · 1 of 1 positions shown · non-contrast
Comparison: 11/15/2015

CLINICAL DATA: Endotracheal tube placement

EXAM:
PORTABLE CHEST 1 VIEW

[chest ap]
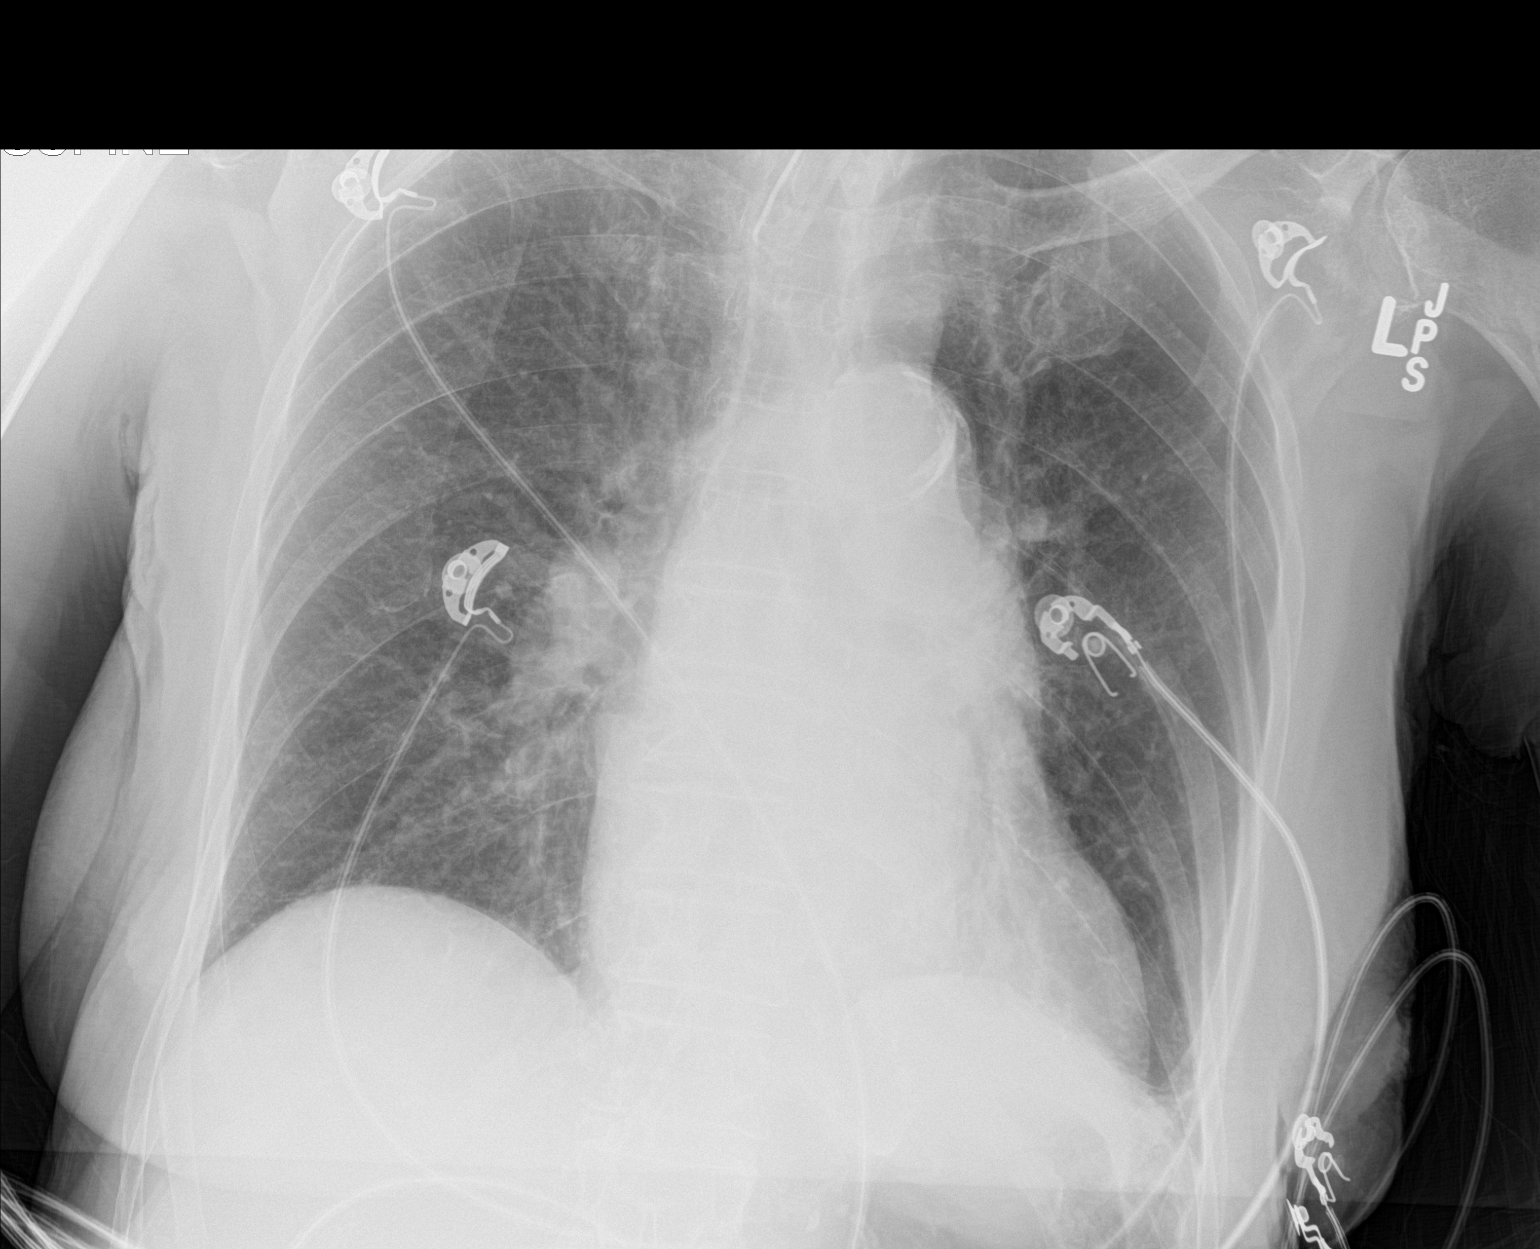

[1 of 1 positions shown; findings below may reference images not displayed]

FINDINGS: New endotracheal tube with tip just below the clavicular heads.

Cardiomegaly. Pulmonary artery enlargement suggesting hypertension.
Mild left infrahilar streaky opacity, compatible with atelectasis.
No edema, effusion, or pneumothorax.
IMPRESSION: 1. New endotracheal tube in unremarkable position.
2. Mild left basilar atelectasis.

## 2017-03-02 IMAGING — CR DG CHEST 1V PORT
1 series · 1 of 1 positions shown · non-contrast
Comparison: 11/19/2015 at [DATE] a.m.

CLINICAL DATA: Encounter for central line placement.

EXAM:
PORTABLE CHEST 1 VIEW

[portable]
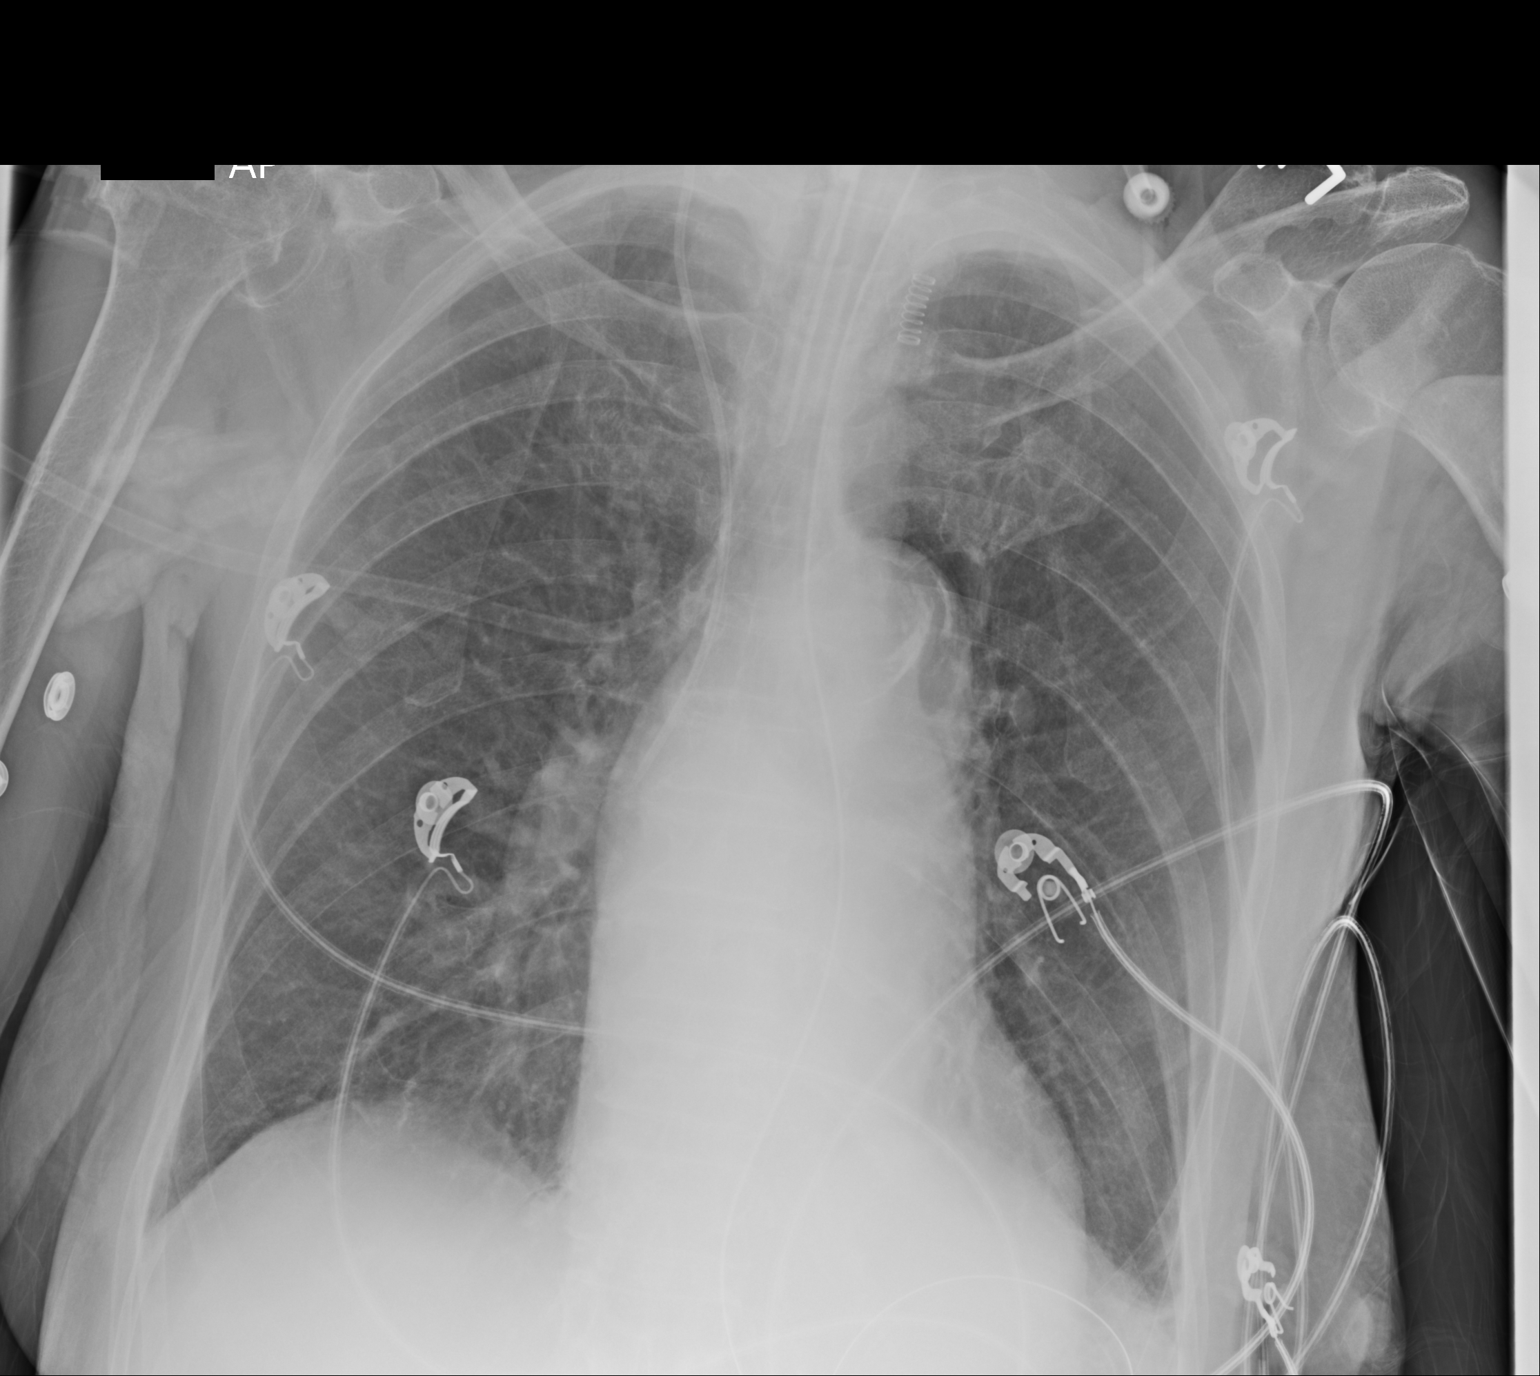

[1 of 1 positions shown; findings below may reference images not displayed]

FINDINGS: New right internal jugular central venous line tip projects in the
lower superior vena cava. No pneumothorax.

A nasogastric tube has also been placed since the prior study
passing below the diaphragm to curl within the stomach.

Endotracheal tube is stable with its tip approximately 5.2 cm above
the [place].

Lungs are hyperexpanded with mild basilar atelectasis most evident
on the left, unchanged.
IMPRESSION: 1. New right internal jugular central venous line catheter tip
projects in the lower superior vena cava. No pneumothorax.
2. Nasogastric tube passes below the diaphragm well within the
stomach.
3. No other change from the earlier exam
